# Patient Record
Sex: Female | Born: 1960 | Race: White | Hispanic: No | Marital: Married | Smoking: Never smoker
Health system: Southern US, Community
[De-identification: ages and names within clinical notes are randomized; demographics above are authoritative.]

## PROBLEM LIST (undated history)

## (undated) DIAGNOSIS — M810 Age-related osteoporosis without current pathological fracture: Secondary | ICD-10-CM

## (undated) DIAGNOSIS — Z8679 Personal history of other diseases of the circulatory system: Secondary | ICD-10-CM

## (undated) DIAGNOSIS — E079 Disorder of thyroid, unspecified: Secondary | ICD-10-CM

## (undated) DIAGNOSIS — K219 Gastro-esophageal reflux disease without esophagitis: Secondary | ICD-10-CM

## (undated) DIAGNOSIS — R197 Diarrhea, unspecified: Secondary | ICD-10-CM

## (undated) DIAGNOSIS — F32A Depression, unspecified: Secondary | ICD-10-CM

## (undated) DIAGNOSIS — N950 Postmenopausal bleeding: Secondary | ICD-10-CM

## (undated) DIAGNOSIS — T887XXA Unspecified adverse effect of drug or medicament, initial encounter: Secondary | ICD-10-CM

## (undated) DIAGNOSIS — E7800 Pure hypercholesterolemia, unspecified: Secondary | ICD-10-CM

## (undated) DIAGNOSIS — F329 Major depressive disorder, single episode, unspecified: Secondary | ICD-10-CM

## (undated) DIAGNOSIS — K649 Unspecified hemorrhoids: Secondary | ICD-10-CM

## (undated) DIAGNOSIS — F319 Bipolar disorder, unspecified: Secondary | ICD-10-CM

## (undated) DIAGNOSIS — E039 Hypothyroidism, unspecified: Secondary | ICD-10-CM

## (undated) DIAGNOSIS — B977 Papillomavirus as the cause of diseases classified elsewhere: Secondary | ICD-10-CM

## (undated) DIAGNOSIS — T7840XA Allergy, unspecified, initial encounter: Secondary | ICD-10-CM

## (undated) DIAGNOSIS — G709 Myoneural disorder, unspecified: Secondary | ICD-10-CM

## (undated) HISTORY — DX: Pure hypercholesterolemia, unspecified: E78.00

## (undated) HISTORY — DX: Diarrhea, unspecified: R19.7

## (undated) HISTORY — PX: TONSILLECTOMY: SUR1361

## (undated) HISTORY — DX: Unspecified adverse effect of drug or medicament, initial encounter: T88.7XXA

## (undated) HISTORY — PX: LYMPH NODE BIOPSY: SHX201

## (undated) HISTORY — DX: Depression, unspecified: F32.A

## (undated) HISTORY — PX: EYE SURGERY: SHX253

## (undated) HISTORY — DX: Disorder of thyroid, unspecified: E07.9

## (undated) HISTORY — DX: Papillomavirus as the cause of diseases classified elsewhere: B97.7

## (undated) HISTORY — PX: HYSTEROSCOPY: SHX211

## (undated) HISTORY — DX: Gastro-esophageal reflux disease without esophagitis: K21.9

## (undated) HISTORY — DX: Hypothyroidism, unspecified: E03.9

## (undated) HISTORY — DX: Unspecified hemorrhoids: K64.9

## (undated) HISTORY — DX: Major depressive disorder, single episode, unspecified: F32.9

## (undated) HISTORY — DX: Myoneural disorder, unspecified: G70.9

## (undated) HISTORY — DX: Age-related osteoporosis without current pathological fracture: M81.0

## (undated) HISTORY — DX: Allergy, unspecified, initial encounter: T78.40XA

---

## 1982-07-30 DIAGNOSIS — B977 Papillomavirus as the cause of diseases classified elsewhere: Secondary | ICD-10-CM

## 1982-07-30 HISTORY — DX: Papillomavirus as the cause of diseases classified elsewhere: B97.7

## 1982-07-30 HISTORY — PX: OTHER SURGICAL HISTORY: SHX169

## 1992-07-30 HISTORY — PX: DILATION AND CURETTAGE, DIAGNOSTIC / THERAPEUTIC: SUR384

## 1997-06-02 ENCOUNTER — Encounter: Payer: Self-pay | Admitting: Internal Medicine

## 1997-10-26 ENCOUNTER — Ambulatory Visit (HOSPITAL_COMMUNITY): Admission: RE | Admit: 1997-10-26 | Discharge: 1997-10-26 | Payer: Self-pay | Admitting: Obstetrics and Gynecology

## 1998-10-10 ENCOUNTER — Other Ambulatory Visit: Admission: RE | Admit: 1998-10-10 | Discharge: 1998-10-10 | Payer: Self-pay | Admitting: Obstetrics and Gynecology

## 1999-11-22 ENCOUNTER — Other Ambulatory Visit: Admission: RE | Admit: 1999-11-22 | Discharge: 1999-11-22 | Payer: Self-pay | Admitting: Obstetrics and Gynecology

## 1999-11-29 ENCOUNTER — Ambulatory Visit (HOSPITAL_COMMUNITY): Admission: RE | Admit: 1999-11-29 | Discharge: 1999-11-29 | Payer: Self-pay | Admitting: Obstetrics and Gynecology

## 1999-11-29 ENCOUNTER — Encounter: Payer: Self-pay | Admitting: Obstetrics and Gynecology

## 2000-11-13 ENCOUNTER — Other Ambulatory Visit: Admission: RE | Admit: 2000-11-13 | Discharge: 2000-11-13 | Payer: Self-pay | Admitting: Obstetrics and Gynecology

## 2001-01-07 ENCOUNTER — Ambulatory Visit (HOSPITAL_COMMUNITY): Admission: RE | Admit: 2001-01-07 | Discharge: 2001-01-07 | Payer: Self-pay | Admitting: Obstetrics and Gynecology

## 2001-01-07 ENCOUNTER — Encounter: Payer: Self-pay | Admitting: Obstetrics and Gynecology

## 2001-11-24 ENCOUNTER — Other Ambulatory Visit: Admission: RE | Admit: 2001-11-24 | Discharge: 2001-11-24 | Payer: Self-pay | Admitting: Obstetrics and Gynecology

## 2002-02-10 ENCOUNTER — Ambulatory Visit (HOSPITAL_COMMUNITY): Admission: RE | Admit: 2002-02-10 | Discharge: 2002-02-10 | Payer: Self-pay | Admitting: Obstetrics and Gynecology

## 2002-02-10 ENCOUNTER — Encounter: Payer: Self-pay | Admitting: Obstetrics and Gynecology

## 2002-12-02 ENCOUNTER — Other Ambulatory Visit: Admission: RE | Admit: 2002-12-02 | Discharge: 2002-12-02 | Payer: Self-pay | Admitting: Obstetrics and Gynecology

## 2003-02-18 ENCOUNTER — Ambulatory Visit (HOSPITAL_COMMUNITY): Admission: RE | Admit: 2003-02-18 | Discharge: 2003-02-18 | Payer: Self-pay | Admitting: Obstetrics and Gynecology

## 2003-02-18 ENCOUNTER — Encounter: Payer: Self-pay | Admitting: Obstetrics and Gynecology

## 2003-12-15 ENCOUNTER — Other Ambulatory Visit: Admission: RE | Admit: 2003-12-15 | Discharge: 2003-12-15 | Payer: Self-pay | Admitting: Obstetrics and Gynecology

## 2004-01-11 ENCOUNTER — Encounter: Payer: Self-pay | Admitting: Internal Medicine

## 2004-03-09 ENCOUNTER — Ambulatory Visit (HOSPITAL_COMMUNITY): Admission: RE | Admit: 2004-03-09 | Discharge: 2004-03-09 | Payer: Self-pay | Admitting: Obstetrics and Gynecology

## 2004-12-19 ENCOUNTER — Other Ambulatory Visit: Admission: RE | Admit: 2004-12-19 | Discharge: 2004-12-19 | Payer: Self-pay | Admitting: Obstetrics and Gynecology

## 2004-12-22 ENCOUNTER — Ambulatory Visit: Payer: Self-pay | Admitting: Internal Medicine

## 2005-05-17 ENCOUNTER — Ambulatory Visit: Payer: Self-pay | Admitting: Internal Medicine

## 2005-06-05 ENCOUNTER — Ambulatory Visit (HOSPITAL_COMMUNITY): Admission: RE | Admit: 2005-06-05 | Discharge: 2005-06-05 | Payer: Self-pay | Admitting: Obstetrics and Gynecology

## 2005-08-01 ENCOUNTER — Ambulatory Visit (HOSPITAL_COMMUNITY): Admission: RE | Admit: 2005-08-01 | Discharge: 2005-08-01 | Payer: Self-pay | Admitting: *Deleted

## 2005-10-16 ENCOUNTER — Ambulatory Visit: Payer: Self-pay | Admitting: Internal Medicine

## 2005-10-30 ENCOUNTER — Ambulatory Visit: Payer: Self-pay | Admitting: Internal Medicine

## 2006-03-25 ENCOUNTER — Ambulatory Visit: Payer: Self-pay | Admitting: Internal Medicine

## 2006-04-03 ENCOUNTER — Ambulatory Visit: Payer: Self-pay | Admitting: Internal Medicine

## 2006-05-29 ENCOUNTER — Ambulatory Visit: Payer: Self-pay | Admitting: Internal Medicine

## 2006-05-29 LAB — CONVERTED CEMR LAB
Free T4: 1 ng/dL (ref 0.9–1.8)
TSH: 0.31 microintl units/mL — ABNORMAL LOW (ref 0.35–5.50)

## 2006-05-30 ENCOUNTER — Other Ambulatory Visit: Admission: RE | Admit: 2006-05-30 | Discharge: 2006-05-30 | Payer: Self-pay | Admitting: Obstetrics and Gynecology

## 2006-07-03 ENCOUNTER — Ambulatory Visit (HOSPITAL_COMMUNITY): Admission: RE | Admit: 2006-07-03 | Discharge: 2006-07-03 | Payer: Self-pay | Admitting: Obstetrics and Gynecology

## 2006-08-21 ENCOUNTER — Ambulatory Visit: Payer: Self-pay | Admitting: Internal Medicine

## 2007-02-27 DIAGNOSIS — F329 Major depressive disorder, single episode, unspecified: Secondary | ICD-10-CM

## 2007-02-27 DIAGNOSIS — J45991 Cough variant asthma: Secondary | ICD-10-CM

## 2007-03-03 ENCOUNTER — Ambulatory Visit: Payer: Self-pay | Admitting: Internal Medicine

## 2007-03-03 DIAGNOSIS — R42 Dizziness and giddiness: Secondary | ICD-10-CM

## 2007-03-03 DIAGNOSIS — T148 Other injury of unspecified body region: Secondary | ICD-10-CM

## 2007-03-03 DIAGNOSIS — W57XXXA Bitten or stung by nonvenomous insect and other nonvenomous arthropods, initial encounter: Secondary | ICD-10-CM

## 2007-03-03 DIAGNOSIS — L259 Unspecified contact dermatitis, unspecified cause: Secondary | ICD-10-CM

## 2007-03-03 DIAGNOSIS — E039 Hypothyroidism, unspecified: Secondary | ICD-10-CM | POA: Insufficient documentation

## 2007-04-14 ENCOUNTER — Telehealth: Payer: Self-pay | Admitting: Internal Medicine

## 2007-05-12 ENCOUNTER — Ambulatory Visit: Payer: Self-pay | Admitting: Internal Medicine

## 2007-05-13 LAB — CONVERTED CEMR LAB: TSH: 1.31 microintl units/mL (ref 0.35–5.50)

## 2007-05-14 ENCOUNTER — Ambulatory Visit: Payer: Self-pay | Admitting: Internal Medicine

## 2007-05-14 DIAGNOSIS — J309 Allergic rhinitis, unspecified: Secondary | ICD-10-CM

## 2007-08-26 ENCOUNTER — Telehealth: Payer: Self-pay | Admitting: Internal Medicine

## 2007-09-04 ENCOUNTER — Ambulatory Visit (HOSPITAL_COMMUNITY): Admission: RE | Admit: 2007-09-04 | Discharge: 2007-09-04 | Payer: Self-pay | Admitting: Obstetrics and Gynecology

## 2007-09-22 ENCOUNTER — Telehealth: Payer: Self-pay | Admitting: Internal Medicine

## 2007-10-14 ENCOUNTER — Telehealth: Payer: Self-pay | Admitting: Internal Medicine

## 2007-11-13 ENCOUNTER — Telehealth: Payer: Self-pay | Admitting: *Deleted

## 2008-05-12 ENCOUNTER — Telehealth: Payer: Self-pay | Admitting: Family Medicine

## 2008-05-24 ENCOUNTER — Encounter: Payer: Self-pay | Admitting: Internal Medicine

## 2008-07-05 ENCOUNTER — Telehealth: Payer: Self-pay | Admitting: *Deleted

## 2008-08-02 ENCOUNTER — Telehealth: Payer: Self-pay | Admitting: *Deleted

## 2008-08-11 ENCOUNTER — Ambulatory Visit: Payer: Self-pay | Admitting: Internal Medicine

## 2008-08-11 DIAGNOSIS — M76899 Other specified enthesopathies of unspecified lower limb, excluding foot: Secondary | ICD-10-CM

## 2008-10-14 ENCOUNTER — Ambulatory Visit (HOSPITAL_COMMUNITY): Admission: RE | Admit: 2008-10-14 | Discharge: 2008-10-14 | Payer: Self-pay | Admitting: Obstetrics and Gynecology

## 2008-12-21 ENCOUNTER — Telehealth: Payer: Self-pay | Admitting: *Deleted

## 2008-12-22 ENCOUNTER — Telehealth: Payer: Self-pay | Admitting: Internal Medicine

## 2009-05-29 ENCOUNTER — Emergency Department (HOSPITAL_COMMUNITY): Admission: EM | Admit: 2009-05-29 | Discharge: 2009-05-29 | Payer: Self-pay | Admitting: Emergency Medicine

## 2009-06-15 ENCOUNTER — Ambulatory Visit: Payer: Self-pay | Admitting: Internal Medicine

## 2009-06-15 DIAGNOSIS — R05 Cough: Secondary | ICD-10-CM | POA: Insufficient documentation

## 2009-06-15 DIAGNOSIS — E785 Hyperlipidemia, unspecified: Secondary | ICD-10-CM

## 2009-06-15 DIAGNOSIS — B079 Viral wart, unspecified: Secondary | ICD-10-CM | POA: Insufficient documentation

## 2009-06-15 LAB — CONVERTED CEMR LAB
BUN: 10 mg/dL (ref 6–23)
Basophils Relative: 0.7 % (ref 0.0–3.0)
Calcium: 9.7 mg/dL (ref 8.4–10.5)
Chloride: 108 meq/L (ref 96–112)
Cholesterol: 189 mg/dL (ref 0–200)
Creatinine, Ser: 0.9 mg/dL (ref 0.4–1.2)
Direct LDL: 102.7 mg/dL
Eosinophils Relative: 5.1 % — ABNORMAL HIGH (ref 0.0–5.0)
GFR calc non Af Amer: 70.55 mL/min (ref >60)
HCT: 38.6 % (ref 36.0–46.0)
Hemoglobin: 13.4 g/dL (ref 12.0–15.0)
Lithium Lvl: 0.66 meq/L — ABNORMAL LOW (ref 0.80–1.40)
Lymphs Abs: 1.9 10*3/uL (ref 0.7–4.0)
MCHC: 34.6 g/dL (ref 30.0–36.0)
Potassium: 4.1 meq/L (ref 3.5–5.1)
RBC: 4.13 M/uL (ref 3.87–5.11)
Sodium: 142 meq/L (ref 135–145)
TSH: 1.01 microintl units/mL (ref 0.35–5.50)

## 2009-06-20 ENCOUNTER — Telehealth: Payer: Self-pay | Admitting: *Deleted

## 2009-07-12 ENCOUNTER — Encounter: Payer: Self-pay | Admitting: Internal Medicine

## 2009-08-04 ENCOUNTER — Ambulatory Visit (HOSPITAL_COMMUNITY): Admission: RE | Admit: 2009-08-04 | Discharge: 2009-08-04 | Payer: Self-pay | Admitting: Obstetrics and Gynecology

## 2009-08-04 HISTORY — PX: HYSTEROSCOPY WITH RESECTOSCOPE: SHX5395

## 2009-08-24 ENCOUNTER — Telehealth: Payer: Self-pay | Admitting: Internal Medicine

## 2009-08-29 ENCOUNTER — Inpatient Hospital Stay (HOSPITAL_COMMUNITY): Admission: AD | Admit: 2009-08-29 | Discharge: 2009-08-29 | Payer: Self-pay | Admitting: Obstetrics and Gynecology

## 2009-08-30 ENCOUNTER — Ambulatory Visit: Payer: Self-pay | Admitting: Internal Medicine

## 2009-08-30 DIAGNOSIS — M546 Pain in thoracic spine: Secondary | ICD-10-CM

## 2009-08-30 DIAGNOSIS — R1013 Epigastric pain: Secondary | ICD-10-CM

## 2009-08-30 DIAGNOSIS — R071 Chest pain on breathing: Secondary | ICD-10-CM

## 2009-09-01 ENCOUNTER — Ambulatory Visit: Payer: Self-pay | Admitting: Internal Medicine

## 2009-09-05 ENCOUNTER — Ambulatory Visit: Payer: Self-pay | Admitting: Internal Medicine

## 2009-09-07 ENCOUNTER — Telehealth: Payer: Self-pay | Admitting: Internal Medicine

## 2009-09-09 ENCOUNTER — Telehealth: Payer: Self-pay | Admitting: Internal Medicine

## 2009-09-30 ENCOUNTER — Telehealth: Payer: Self-pay | Admitting: *Deleted

## 2009-12-01 ENCOUNTER — Encounter: Payer: Self-pay | Admitting: Internal Medicine

## 2009-12-21 ENCOUNTER — Encounter: Payer: Self-pay | Admitting: Internal Medicine

## 2010-01-06 ENCOUNTER — Ambulatory Visit (HOSPITAL_COMMUNITY): Admission: RE | Admit: 2010-01-06 | Discharge: 2010-01-06 | Payer: Self-pay | Admitting: Obstetrics and Gynecology

## 2010-04-20 ENCOUNTER — Ambulatory Visit: Payer: Self-pay | Admitting: Family Medicine

## 2010-04-20 DIAGNOSIS — J3489 Other specified disorders of nose and nasal sinuses: Secondary | ICD-10-CM | POA: Insufficient documentation

## 2010-04-21 ENCOUNTER — Encounter: Payer: Self-pay | Admitting: Internal Medicine

## 2010-04-22 ENCOUNTER — Ambulatory Visit: Payer: Self-pay | Admitting: Family Medicine

## 2010-04-24 ENCOUNTER — Telehealth: Payer: Self-pay | Admitting: Family Medicine

## 2010-04-24 ENCOUNTER — Encounter: Payer: Self-pay | Admitting: Family Medicine

## 2010-04-24 ENCOUNTER — Telehealth (INDEPENDENT_AMBULATORY_CARE_PROVIDER_SITE_OTHER): Payer: Self-pay | Admitting: *Deleted

## 2010-04-24 ENCOUNTER — Telehealth: Payer: Self-pay | Admitting: *Deleted

## 2010-06-27 ENCOUNTER — Ambulatory Visit: Payer: Self-pay | Admitting: Internal Medicine

## 2010-07-19 ENCOUNTER — Telehealth: Payer: Self-pay | Admitting: Internal Medicine

## 2010-07-20 ENCOUNTER — Ambulatory Visit: Payer: Self-pay | Admitting: Internal Medicine

## 2010-08-02 ENCOUNTER — Encounter: Payer: Self-pay | Admitting: Internal Medicine

## 2010-08-02 ENCOUNTER — Ambulatory Visit
Admission: RE | Admit: 2010-08-02 | Discharge: 2010-08-02 | Payer: Self-pay | Source: Home / Self Care | Attending: Internal Medicine | Admitting: Internal Medicine

## 2010-08-02 DIAGNOSIS — K219 Gastro-esophageal reflux disease without esophagitis: Secondary | ICD-10-CM | POA: Insufficient documentation

## 2010-08-02 DIAGNOSIS — M722 Plantar fascial fibromatosis: Secondary | ICD-10-CM | POA: Insufficient documentation

## 2010-08-03 ENCOUNTER — Telehealth: Payer: Self-pay | Admitting: *Deleted

## 2010-08-11 ENCOUNTER — Ambulatory Visit
Admission: RE | Admit: 2010-08-11 | Discharge: 2010-08-11 | Payer: Self-pay | Source: Home / Self Care | Attending: Internal Medicine | Admitting: Internal Medicine

## 2010-08-19 ENCOUNTER — Encounter: Payer: Self-pay | Admitting: Obstetrics and Gynecology

## 2010-08-20 ENCOUNTER — Encounter: Payer: Self-pay | Admitting: Obstetrics and Gynecology

## 2010-08-27 LAB — CONVERTED CEMR LAB
AST: 20 units/L (ref 0–37)
BUN: 17 mg/dL (ref 6–23)
Basophils Absolute: 0 10*3/uL (ref 0.0–0.1)
Basophils Relative: 0.2 % (ref 0.0–3.0)
Bilirubin, Direct: 0.1 mg/dL (ref 0.0–0.3)
CO2: 26 meq/L (ref 19–32)
Creatinine, Ser: 0.9 mg/dL (ref 0.4–1.2)
Direct LDL: 158.6 mg/dL
Eosinophils Absolute: 0.4 10*3/uL (ref 0.0–0.7)
Eosinophils Relative: 4.6 % (ref 0.0–5.0)
Glucose, Bld: 90 mg/dL (ref 70–99)
Glucose, Urine, Semiquant: NEGATIVE
HCT: 40.1 % (ref 36.0–46.0)
HDL: 52.6 mg/dL (ref >39.00)
Hemoglobin: 13.7 g/dL (ref 12.0–15.0)
Lithium Lvl: 0.63 meq/L — ABNORMAL LOW (ref 0.80–1.40)
MCV: 92.7 fL (ref 78.0–100.0)
Monocytes Absolute: 0.7 10*3/uL (ref 0.1–1.0)
Monocytes Relative: 7 % (ref 3.0–12.0)
Neutro Abs: 6 10*3/uL (ref 1.4–7.7)
Nitrite: NEGATIVE
Potassium: 4.5 meq/L (ref 3.5–5.1)
RBC: 4.33 M/uL (ref 3.87–5.11)
Sodium: 139 meq/L (ref 135–145)
TSH: 1.84 microintl units/mL (ref 0.35–5.50)
VLDL: 37.8 mg/dL (ref 0.0–40.0)
WBC Urine, dipstick: NEGATIVE
WBC: 9.5 10*3/uL (ref 4.5–10.5)

## 2010-08-29 NOTE — Progress Notes (Signed)
Summary: cold sore  Phone Note Call from Patient Call back at [phone]   Caller: patient triage message Call For: Dusty Raczkowski  Summary of Call: what can she take or put on a cold sore.  [facility] [employer]  Initial call taken by: Roselle Locus,  August 26, 2007 9:12 AM  Follow-up for Phone Call        Called to set appt.  She's in [place] & just got her sister's Zovirax cream for cold sores that she'll use. Follow-up by: Rudy Jew, RN,  August 26, 2007 9:22 AM

## 2010-08-29 NOTE — Letter (Signed)
Summary: [facility]  [facility]   Imported By: Maryln Gottron 08/15/2009 10:41:06  _____________________________________________________________________  External Attachment:    Type:   Image     Comment:   External Document

## 2010-08-29 NOTE — Assessment & Plan Note (Signed)
Summary: 3 day fup/jlp   Vital Signs:  Patient profile:   50 year old female Menstrual status:  irregular Weight:      142 pounds O2 Sat:      98 % on Room air Pulse rate:   83 / minute BP sitting:   120 / 80  (right arm) Cuff size:   regular  Vitals Entered By: Romualdo Bolk, CMA (AAMA) (September 05, 2009 11:36 AM)  O2 Flow:  Room air CC: Follow-up visit on cough. Pt states that she is getting better.   History of Present Illness: Lisa Lee comesin today  for   follow up. .  Her pain is gone  and  said she is much better . 95 %  no fever cough continues but no wheezing . No NVD some ab bleatins prob fropm medications.    Pain is gone.      No sig sob. or cp.  Preventive Screening-Counseling & Management  Alcohol-Tobacco     Alcohol drinks/day: <1     Alcohol type: wine     Smoking Status: never  Caffeine-Diet-Exercise     Caffeine use/day: 2     Does Patient Exercise: yes  Current Medications (verified): 1)  Proventil Hfa 108 (90 Base) Mcg/act Aers (Albuterol Sulfate) .... 2 Puffs As Needed 2)  Synthroid 75 Mcg Tabs (Levothyroxine Sodium) .... Take 1 Tablet By Mouth Once A Day 3)  Lexapro 20 Mg  Tabs (Escitalopram Oxalate) 4)  Lamictal 200 Mg  Tabs (Lamotrigine) 5)  Lithium Carbonate 450 Mg  Tbcr (Lithium Carbonate) .... Take 1 Tablet By Mouth Two Times A Day 6)  Asmanex 60 Metered Doses 220 Mcg/inh  Aepb (Mometasone Furoate) .... 2 Puffs Per Day 7)  Hydromet 5-1.5 Mg/1ml Syrp (Hydrocodone-Homatropine) .Marland Kitchen.. 1-2 Tsp Q 4-6 Hours As Needed Cough 8)  Zofran 4 Mg Tabs (Ondansetron Hcl) .... As Needed  Allergies (verified): 1)  ! Penicillin  Past History:  Past Medical History: Asthma Depression Allergic rhinitis Hypothyroidism Had colonoscopy in the past Consults [facility] Dr. Buena Irish Dr. [facility]  Dr. 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 Divorced Never Smoked Alcohol use-yes Drug use-no Regular exercise-yes hh of 3   pets  2 cats 1 dog .   Review of Systems  The patient denies anorexia, fever, weight loss, chest pain, dyspnea on exertion, and peripheral edema.    Physical Exam  General:  Well-developed,well-nourished,in no acute distress; alert,appropriate and cooperative throughout examination Head:  normocephalic and atraumatic.   Eyes:  vision grossly intact, pupils equal, and pupils round.   Ears:  R ear normal, L ear normal, and no external deformities.   Nose:  no nasal discharge.   Mouth:  pharynx pink and moist.   Neck:  No deformities, masses, or tenderness noted. Lungs:  Normal respiratory effort, chest expands symmetrically. Lungs are clear to auscultation, no crackles or wheezes.no dullness.   Heart:  Normal rate and regular rhythm. S1 and S2 normal without gallop, murmur, click, rub or other extra sounds. Abdomen:  Bowel sounds positive,abdomen soft and non-tender without masses, organomegaly or  noted. Extremities:  no clubbing cyanosis or edema  Skin:  turgor normal and color normal.   Cervical Nodes:  No lymphadenopathy noted Psych:  Oriented X3, normally interactive, good eye contact, not anxious appearing, and not depressed appearing.     Impression &  Recommendations:  Problem # 1:  PAINFUL RESPIRATION (ICD-786.52) Assessment Improved resolved   presumed from infection  Problem # 2:  COUGH (ICD-786.2) slightly improved .    no asthmatic symptom otherwise   this could be post infectious  cough  Complete Medication List: 1)  Proventil Hfa 108 (90 Base) Mcg/act Aers (Albuterol sulfate) .... 2 puffs as needed 2)  Synthroid 75 Mcg Tabs (Levothyroxine sodium) .... Take 1 tablet by mouth once a day 3)  Lexapro 20 Mg Tabs (Escitalopram oxalate) 4)  Lamictal 200 Mg Tabs (Lamotrigine) 5)  Lithium Carbonate 450 Mg Tbcr  (Lithium carbonate) .... Take 1 tablet by mouth two times a day 6)  Asmanex 60 Metered Doses 220 Mcg/inh Aepb (Mometasone furoate) .... 2 puffs per day 7)  Hydromet 5-1.5 Mg/11ml Syrp (Hydrocodone-homatropine) .Marland Kitchen.. 1-2 tsp q 4-6 hours as needed cough 8)  Zofran 4 Mg Tabs (Ondansetron hcl) .... As needed  Patient Instructions: 1)  ok to work tomorrow . 2)  You may cough another 5-7 days then should be better. 3)  Call if needed.    stay on asthma meds  4)  Ok to call if wants new colonscopy freferral

## 2010-08-29 NOTE — Progress Notes (Signed)
Summary: Culture confirmed MRSA  Phone Note Call from Patient Call back at [phone] (cell)   Caller: Patient Call For: Burchette Reason for Call: Referral Summary of Call: VM from pt reporting she did see Dr Ezzard Standing and wound is draining adequately on it's own, culture did confirm MRSA, cont on Keflex or does he want to make a change? Initial call taken by: Sid Falcon LPN,  April 24, 2010 5:26 PM  Follow-up for Phone Call        I'm confused.  Cx report I saw did not show MRSA but normal flora.  If no MRSA, keflex should be adequate. Follow-up by: Evelena Peat MD,  April 24, 2010 7:12 PM  Additional Follow-up for Phone Call Additional follow up Details #1::        I spoke with pt this AM, Dr Ezzard Standing called for a 2nd culture report yesterday afternoon around 3:30pm.  The report was faxed to him, mod MRSA.  Pt is taking both the Keflex and Bactrim. Additional Follow-up by: Sid Falcon LPN,  April 25, 2010 8:42 AM    Additional Follow-up for Phone Call Additional follow up Details #2::    At this point she should remain on the Septra (we do need a sensitivity report when available). Follow-up by: Evelena Peat MD,  April 25, 2010 12:34 PM  Additional Follow-up for Phone Call Additional follow up Details #3:: Details for Additional Follow-up Action Taken: Pt aware of results. She states that she had a culture done at the [facility]. No results are in the system. But pt does have a copy of the results that states she has MRSA. PT is going to continue the Septra. Additional Follow-up by: Romualdo Bolk, CMA (AAMA),  April 25, 2010 1:02 PM

## 2010-08-29 NOTE — Progress Notes (Signed)
Summary: refill on tussinex  Phone Note Outgoing Call Call back at [phone]   Call placed by: Romualdo Bolk, CMA Duncan Dull),  August 24, 2009 12:08 PM Call placed to: Patient Summary of Call: recieved a refill request from [facility] for Tussinex. I spoke to pt and she said that she does need a refill on this. Pt states that she is having some coughing and congestion but doesn't feeling like she needs to come in. This has been going 3 days. No fever, some wheezing. Pt wants to wait and see how she feels tomorrow before make appt.  Pt has tried Mucinex DM but it didn't help. The wheezing and cough is worse at night and she has a had to use her inhaler for this.  Initial call taken by: Romualdo Bolk, CMA Duncan Dull),  August 24, 2009 12:11 PM  Follow-up for Phone Call        Per Dr. Fabian Sharp- can do hydrocodone cough syrup 6 oz 1-2 tsp every 4-6 hours as needed for cough and can add an antihistamine otc.  Pt aware and wants [place] to [facility]. Pharmacy notified that tussinex has been denied. Rx called in. Follow-up by: Romualdo Bolk, CMA (AAMA),  August 24, 2009 4:55 PM    New/Updated Medications: HYDROMET 5-1.5 MG/5ML SYRP (HYDROCODONE-HOMATROPINE) 1-2 tsp q 4-6 hours as needed cough Prescriptions: HYDROMET 5-1.5 MG/5ML SYRP (HYDROCODONE-HOMATROPINE) 1-2 tsp q 4-6 hours as needed cough  #6oz x 0   Entered by:   Romualdo Bolk, CMA (AAMA)   Authorized by:   Madelin Headings MD   Signed by:   Romualdo Bolk, CMA (AAMA) on 08/24/2009   Method used:   Telephoned to ...       [facility]  [phone]* (retail)       [address] [place], [place]  [address]       Ph: [phone] or [phone]       Fax: [phone]   RxID:   [id number]

## 2010-08-29 NOTE — Progress Notes (Signed)
Summary: chest still tight & coughing  Phone Note Call from Patient Call back at [phone]   Summary of Call: Still coughing a lot & chest feels tight.  Started steroids yesterday.  Should I come in again or be patient?  Concerned with the weekend coming.  Coming up clear or white mucus, fair amount.  Using inhaler be cause she is wheezy.   No fever or chills.  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 Initial call taken by: Rudy Jew, RN,  September 09, 2009 9:11 AM  Follow-up for Phone Call        Per Dr. Fabian Sharp- should be okay- if feels tight tomorrow call and be seen at [facility]. Follow-up by: Romualdo Bolk, CMA (AAMA),  September 09, 2009 10:02 AM  Additional Follow-up for Phone Call Additional follow up Details #1::        Patient says okay.   Additional Follow-up by: Rudy Jew, RN,  September 09, 2009 10:17 AM

## 2010-08-29 NOTE — Progress Notes (Signed)
Summary: REFILL  Phone Note Refill Request Message from:  Fax from Pharmacy  Refills Requested: Medication #1:  SYNTHROID 75 MCG TABS Take 1 tablet by mouth once a day [BMN] [facility]---[phone]     FAX---[phone]  Initial call taken by: Warnell Forester,  September 30, 2009 3:43 PM  Follow-up for Phone Call        Rx sent to the pharmacy Follow-up by: Romualdo Bolk, CMA Duncan Dull),  September 30, 2009 3:56 PM    Prescriptions: SYNTHROID 75 MCG TABS (LEVOTHYROXINE SODIUM) Take 1 tablet by mouth once a day Brand medically necessary #90 x 3   Entered by:   Romualdo Bolk, CMA (AAMA)   Authorized by:   Madelin Headings MD   Signed by:   Romualdo Bolk, CMA (AAMA) on 09/30/2009   Method used:   Electronically to        [facility]* (retail)       [address]       [address] [employer]       [place], [place]  [address]       Ph: [phone]       Fax: [phone]   RxID:   [id number]

## 2010-08-29 NOTE — Assessment & Plan Note (Signed)
Summary: 2 DAY ROV/NJR   Vital Signs:  Patient profile:   50 year old female Menstrual status:  irregular Weight:      140 pounds O2 Sat:      99 % on Room air Temp:     98.2 degrees F oral Pulse rate:   65 / minute BP sitting:   100 / 70  (right arm) Cuff size:   regular  Vitals Entered By: Romualdo Bolk, CMA (AAMA) (September 01, 2009 9:43 AM)  O2 Flow:  Room air CC: Follow-up visit- Pt states that she is feeling some better but is still coughing alot.   History of Present Illness: Lisa Lee comesin today with her husband  for    follow up of painful resp   and cough .   she    over the last few days and is feeling much better but fatigued. Still coughing   . Hydrocodone helping . as well as the zofran.   no fever or chills.  NO Vor D .   dec appetite.   Asthma seems stable.   Preventive Screening-Counseling & Management  Alcohol-Tobacco     Alcohol drinks/day: <1     Alcohol type: wine     Smoking Status: never  Caffeine-Diet-Exercise     Caffeine use/day: 2     Does Patient Exercise: yes  Current Medications (verified): 1)  Proventil Hfa 108 (90 Base) Mcg/act Aers (Albuterol Sulfate) .... 2 Puffs As Needed 2)  Synthroid 75 Mcg Tabs (Levothyroxine Sodium) .... Take 1 Tablet By Mouth Once A Day 3)  Lexapro 20 Mg  Tabs (Escitalopram Oxalate) 4)  Lamictal 200 Mg  Tabs (Lamotrigine) 5)  Lithium Carbonate 450 Mg  Tbcr (Lithium Carbonate) .... Take 1 Tablet By Mouth Two Times A Day 6)  Asmanex 60 Metered Doses 220 Mcg/inh  Aepb (Mometasone Furoate) .... 2 Puffs Per Day 7)  Hydromet 5-1.5 Mg/27ml Syrp (Hydrocodone-Homatropine) .Marland Kitchen.. 1-2 Tsp Q 4-6 Hours As Needed Cough  Allergies (verified): 1)  ! Penicillin  Past History:  Past medical, surgical, family and social histories (including risk factors) reviewed for relevance to current acute and chronic problems.  Past Medical History: Reviewed history from 08/11/2008 and no changes  required. Asthma Depression Allergic rhinitis Hypothyroidism  Consults [facility] Dr. Buena Irish Dr. [facility]  Dr. Genella Mech  Past Surgical History: Reviewed history from 08/30/2009 and no changes required. hysteroscopy   uterin polypectomy  Jan 7th   Past History:  Care Management: Gynecology: [facility] Gastroenterology: [facility]  Family History: Reviewed history from 02/27/2007 and no changes required. Family History of Arthritis Family History High cholesterol Family History Hypertension Family History Other cancer-Colon Family History of Cardiovascular disorder  Social History: Reviewed history from 08/11/2008 and no changes required. Retired Divorced Never Smoked Alcohol use-yes Drug use-no Regular exercise-yes hh of 3   pets  2 cats 1 dog .   Review of Systems       The patient complains of anorexia and chest pain.  The patient denies fever, syncope, peripheral edema, hemoptysis, abdominal pain, melena, hematochezia, severe indigestion/heartburn, transient blindness, difficulty walking, unusual weight change, abnormal bleeding, and enlarged lymph nodes.    Physical Exam  General:  tired but non toxic  in nad  more comfortable today.  no resp distress Head:  normocephalic and atraumatic.   Eyes:  clear  no discharge  Ears:  R ear normal and L ear normal.   Nose:  mild congestion Mouth:  pharynx pink and moist.   Neck:  No deformities, masses, or tenderness noted. Lungs:  Normal respiratory effort, chest expands symmetrically. Lungs are clear to auscultation, no crackles or wheezes.no dullness.   Heart:  Normal rate and regular rhythm. S1 and S2 normal without gallop, murmur, click, rub or other extra sounds.no lifts.   Abdomen:  Bowel sounds positive,abdomen soft and non-tender without masses, organomegaly or   noted. Pulses:  nl cap refill  Extremities:  no clubbing cyanosis or edema  Neurologic:   non focal  Skin:  turgor normal, color normal, no petechiae, and no purpura.   Cervical Nodes:  No lymphadenopathy noted Psych:  Oriented X3, good eye contact, and not anxious appearing.     Impression & Recommendations:  Problem # 1:  PAINFUL RESPIRATION (ICD-786.52) Assessment Improved initially said 90 % better but after time in office and coughing  felt  increasing symptom but still improved .  prob from RTI consider early pneumonia based on WBC and presentation even though c xray ok.     rest improving these symptom  on day 4 of antibiotic and taking hydrocodone.   no resp compromise.    Problem # 2:  ABDOMINAL PAIN, EPIGASTRIC (ICD-789.06) Assessment: Improved zofran for nausea   still taking  ibu  but ok.     Problem # 3:  COUGH (ICD-786.2) WIth hx of asthma and RTI        Problem # 4:  ASTHMA (ICD-493.90) Assessment: Comment Only  Her updated medication list for this problem includes:    Proventil Hfa 108 (90 Base) Mcg/act Aers (Albuterol sulfate) .Marland Kitchen... 2 puffs as needed    Asmanex 60 Metered Doses 220 Mcg/inh Aepb (Mometasone furoate) .Marland Kitchen... 2 puffs per day  Complete Medication List: 1)  Proventil Hfa 108 (90 Base) Mcg/act Aers (Albuterol sulfate) .... 2 puffs as needed 2)  Synthroid 75 Mcg Tabs (Levothyroxine sodium) .... Take 1 tablet by mouth once a day 3)  Lexapro 20 Mg Tabs (Escitalopram oxalate) 4)  Lamictal 200 Mg Tabs (Lamotrigine) 5)  Lithium Carbonate 450 Mg Tbcr (Lithium carbonate) .... Take 1 tablet by mouth two times a day 6)  Asmanex 60 Metered Doses 220 Mcg/inh Aepb (Mometasone furoate) .... 2 puffs per day 7)  Hydromet 5-1.5 Mg/44ml Syrp (Hydrocodone-homatropine) .Marland Kitchen.. 1-2 tsp q 4-6 hours as needed cough  Patient Instructions: 1)  no work  this weekend .    2)  OV on monday and   3)  call over weekend if  progressive increasing  sob.  greater than 50% of visit spent in counseling  25 minutes

## 2010-08-29 NOTE — Progress Notes (Signed)
Summary: ENT today  Phone Note Call from Patient Call back at Home Phone [phone]   Caller: Patient Summary of Call: Pt called and said that she was returning a call from Dr. Caryl Never. Pls call back.  Initial call taken by: Lucy Antigua,  April 24, 2010 9:10 AM  Follow-up for Phone Call        spoke with pt .  some better but stiil has some L nasal swelling. Set up to see ENT, today if possible.  See if we can get in to see Dr Narda Bonds. Follow-up by: Evelena Peat MD,  April 24, 2010 9:16 AM  Additional Follow-up for Phone Call Additional follow up Details #1::        Appt Scheduled Today 9/26 @ 2.45pm with Dr. Ezzard Standing.  Pt aware. Additional Follow-up by: Corky Mull,  April 24, 2010 9:51 AM

## 2010-08-29 NOTE — Progress Notes (Signed)
Summary: cough/wheezing  Phone Note Call from Patient   Caller: Patient Call For: Madelin Headings MD Summary of Call: Pt calls to let Dr. Fabian Sharp know that her cough continues with the Mucinex, and is wheezing.  ? about starting Steroids? No fever or other symptoms. [phone] Initial call taken by: Lynann Beaver CMA,  September 07, 2009 11:23 AM  Follow-up for Phone Call        Per Dr. Fabian Sharp- Call in Prednisone 20mg  3 by mouth once daily for 2 days then 2 by mouth once daily for 3 day or as directed #20. Rx sent electronically. I tried to call pt at work but her line was busy. I called pt's cell and left her a message that we called in rx for her. Follow-up by: Romualdo Bolk, CMA (AAMA),  September 07, 2009 1:37 PM    New/Updated Medications: PREDNISONE 20 MG TABS (PREDNISONE) 3 by mouth once daily for 2 days then 2 by mouth once daily for 3 days or as directed Prescriptions: PREDNISONE 20 MG TABS (PREDNISONE) 3 by mouth once daily for 2 days then 2 by mouth once daily for 3 days or as directed  #20 x 0   Entered by:   Romualdo Bolk, CMA (AAMA)   Authorized by:   Madelin Headings MD   Signed by:   Romualdo Bolk, CMA (AAMA) on 09/07/2009   Method used:   [facility]        [facility]  [phone]* (retail)       [address] [place], [place]  [address]       Ph: [phone] or [phone]       Fax: [phone]   RxID:   [id number]

## 2010-08-29 NOTE — Progress Notes (Signed)
Summary: pain med blyth saw for p  Phone Note Call from Patient Call back at [phone]   Caller: vm 9-23 4:44 Call For: blyth for panosh Summary of Call: Requesting pain med for thing on face.  Tylenol - chewing up stomach & not holding.  [facility]. Initial call taken by: Rudy Jew, RN,  April 24, 2010 8:12 AM  Follow-up for Phone Call        can try Tramadol 50mg  by mouth three times a day as needed pain, # 40, 0 rf Follow-up by: Danise Edge MD,  April 24, 2010 9:25 AM

## 2010-08-29 NOTE — Procedures (Signed)
Summary: Colonoscopy Report/[facility]  Colonoscopy Report/[facility]   Imported By: Maryln Gottron 12/30/2009 11:23:14  _____________________________________________________________________  External Attachment:    Type:   Image     Comment:   External Document

## 2010-08-29 NOTE — Consult Note (Signed)
Summary: ENT-Dr. Narda Bonds  ENT-Dr. Narda Bonds   Imported By: Maryln Gottron 05/02/2010 10:19:21  _____________________________________________________________________  External Attachment:    Type:   Image     Comment:   External Document

## 2010-08-29 NOTE — Assessment & Plan Note (Signed)
Summary: MERSA/DLO   Vital Signs:  Patient profile:   50 year old female Menstrual status:  irregular Height:      61 inches (154.94 cm) Weight:      143.50 pounds (65.23 kg) BMI:     27.21 O2 Sat:      96 % on Room air Temp:     97.8 degrees F (36.56 degrees C) oral Pulse rate:   80 / minute BP sitting:   116 / 70  (left arm) Cuff size:   regular  Vitals Entered By: Lucious Groves CMA (April 22, 2010 9:37 AM)  O2 Flow:  Room air CC: Confirmed MRSA and nose pain./kb Is Patient Diabetic? No Pain Assessment Patient in pain? yes     Location: nose Intensity: 4-5 Type: aching Onset of pain  since Wed. Comments Patient notes that the ABX given (Bactroban and Bactrim) have not helped./kb   History of Present Illness: Worsening redness and pain nose. Seen earlier this week and intranasal cx sent. No hx MRSA.  Pt placed on Septra with no improvement. Pt also using bactroban intranasal without improvement. No fever.   Works in Facilities manager.  No other rashes.  Current Medications (verified): 1)  Proventil Hfa 108 (90 Base) Mcg/act Aers (Albuterol Sulfate) .... 2 Puffs As Needed 2)  Synthroid 75 Mcg Tabs (Levothyroxine Sodium) .... Take 1 Tablet By Mouth Once A Day 3)  Lexapro 20 Mg  Tabs (Escitalopram Oxalate) 4)  Lamictal 200 Mg  Tabs (Lamotrigine) 5)  Lithium Carbonate 450 Mg  Tbcr (Lithium Carbonate) .... Take 1 Tablet By Mouth Two Times A Day 6)  Asmanex 60 Metered Doses 220 Mcg/inh  Aepb (Mometasone Furoate) .... 2 Puffs Per Day 7)  Bactrim Ds 800-160 Mg Tabs (Sulfamethoxazole-Trimethoprim) .Marland Kitchen.. 1 Tab By Mouth Two Times A Day X 10 Days 8)  Bactroban 2 % Oint (Mupirocin) .... Apply To B/l Nares Two Times A Day X 7 Days W/new Qtip Each Application  Allergies (verified): 1)  ! Penicillin  Past History:  Past Medical History: Last updated: 09/05/2009 Asthma Depression Allergic rhinitis Hypothyroidism Had colonoscopy in the past [facility] Counseling Dr.  Buena Irish Dr. [facility]  Dr. Genella Mech PMH reviewed for relevance  Review of Systems      See HPI  Physical Exam  General:  Well-developed,well-nourished,in no acute distress; alert,appropriate and cooperative throughout examination Ears:  External ear exam shows no significant lesions or deformities.  Otoscopic examination reveals clear canals, tympanic membranes are intact bilaterally without bulging, retraction, inflammation or discharge. Hearing is grossly normal bilaterally. Nose:  pt has erythema distal nose and inside L naris some nonfluctuant swelling.  Tender to palpation.  After obtaining consent from pt, used #25 gauge needle and tried to unroof scabbed area and minimal purulence expressed. Mouth:  Oral mucosa and oropharynx without lesions or exudates.  Teeth in good repair. Neck:  No deformities, masses, or tenderness noted.   Impression & Recommendations:  Problem # 1:  OTHER DISEASES OF NASAL CAVITY AND SINUSES (ICD-478.19) cellulitis nose with ?of early abscess.  Very little purulence expressed as noted. Recultured .  Will add Keflex for possible strep coverage pending culture results. Pt encouraged to use warm compresses. Orders: T-Culture, Wound ([id number])  Complete Medication List: 1)  Proventil Hfa 108 (90 Base) Mcg/act Aers (Albuterol sulfate) .... 2 puffs as needed 2)  Synthroid 75 Mcg Tabs (Levothyroxine sodium) .... Take 1 tablet by mouth once a day 3)  Lexapro 20 Mg Tabs (Escitalopram oxalate) 4)  Lamictal  200 Mg Tabs (Lamotrigine) 5)  Lithium Carbonate 450 Mg Tbcr (Lithium carbonate) .... Take 1 tablet by mouth two times a day 6)  Asmanex 60 Metered Doses 220 Mcg/inh Aepb (Mometasone furoate) .... 2 puffs per day 7)  Bactrim Ds 800-160 Mg Tabs (Sulfamethoxazole-trimethoprim) .Marland Kitchen.. 1 tab by mouth two times a day x 10 days 8)  Bactroban 2 % Oint (Mupirocin) .... Apply to b/l nares two times a day x 7 days w/new qtip each application 9)   Cephalexin 500 Mg Caps (Cephalexin) .... One by mouth three times a day for 10 days 10)  Oxycodone-acetaminophen 5-325 Mg Tabs (Oxycodone-acetaminophen) .Marland Kitchen.. 1-2 by mouth every 4-6 hours as needed pain  Patient Instructions: 1)  Warm compresses several times daily 2)  Call or follow up promptly for any fever or worsening erythema. Prescriptions: OXYCODONE-ACETAMINOPHEN 5-325 MG TABS (OXYCODONE-ACETAMINOPHEN) 1-2 by mouth every 4-6 hours as needed pain  #15 x 0   Entered and Authorized by:   Evelena Peat MD   Signed by:   Evelena Peat MD on 04/22/2010   Method used:   Print then Give to Patient   RxID:   [id number] CEPHALEXIN 500 MG CAPS (CEPHALEXIN) one by mouth three times a day for 10 days  #30 x 0   Entered and Authorized by:   Evelena Peat MD   Signed by:   Evelena Peat MD on 04/22/2010   Method used:   Electronically to        [facility]  [phone]* (retail)       [address] [place], [place]  [address]       Ph: [phone] or [phone]       Fax: [phone]   RxID:   [id number]

## 2010-08-29 NOTE — Assessment & Plan Note (Signed)
Summary: PLEURISY / URI? / CONGESTION // RS   Vital Signs:  Patient profile:   50 year old female Menstrual status:  irregular Weight:      140 pounds O2 Sat:      99 % on Room air Temp:     97.6 degrees F oral Pulse rate:   72 / minute BP sitting:   110 / 60  (right arm) Cuff size:   regular  Vitals Entered By: Romualdo Bolk, CMA (AAMA) (August 30, 2009 8:25 AM)  O2 Flow:  Room air CC: Coughing. Then on 1/31 pt took motrin and sudafed. Got sick to her stomach and started hurting in her chest area and vomiting. Pt was taken to [facility] admissions. She was given stadol and phenergan. Dx with plurisy and was told to follow up with [place].   History of Present Illness: Lisa Lee comesin today for   for follow up of ED visit  at [facility]  this past weekend  went to work and got sick and was seen in ed by Dr Stefano Gaul.   Jan 6   for uterine polyp  had bleeding .  NO  complication  but had a cold at that time.    then  better until  .  jan 22 or so and then developed cough and st.  ? if fever at onset.   See last  phone note.   cough med helped some and cough actually getting better  . However Yesterday had  epigastric   abd pain fatigue and dizziness and nausea and then got  back thorax pain  sore all over left more than right . hurts to breath and move.  feels some Increase respirations and nose congestion   . cough   Had ed eval with labs c xtray and EKG. nl vs and pulse ox  WBC were 10.7   Was told to take nsaid and hydodan meds for pain and given a z pack .      Cough  slightly better the day.  Had diarrhea last pm  . Used zofran. and motrin and hycodan   around the clock.  Not taking much liwuwids today.    feels tired .  No current wheezing on her asthma controller med.    Has had lithium level recently and was normal.  no hx of hormone use of dvt . No leg swelling.  Preventive Screening-Counseling & Management  Alcohol-Tobacco     Alcohol  drinks/day: <1     Alcohol type: wine     Smoking Status: never  Caffeine-Diet-Exercise     Caffeine use/day: 2     Does Patient Exercise: yes  Current Medications (verified): 1)  Proventil Hfa 108 (90 Base) Mcg/act Aers (Albuterol Sulfate) .... 2 Puffs As Needed 2)  Synthroid 75 Mcg Tabs (Levothyroxine Sodium) .... Take 1 Tablet By Mouth Once A Day 3)  Lexapro 20 Mg  Tabs (Escitalopram Oxalate) 4)  Lamictal 200 Mg  Tabs (Lamotrigine) 5)  Lithium Carbonate 450 Mg  Tbcr (Lithium Carbonate) .... Take 1 Tablet By Mouth Two Times A Day 6)  Asmanex 60 Metered Doses 220 Mcg/inh  Aepb (Mometasone Furoate) .... 2 Puffs Per Day 7)  Hydromet 5-1.5 Mg/62ml Syrp (Hydrocodone-Homatropine) .Marland Kitchen.. 1-2 Tsp Q 4-6 Hours As Needed Cough  Allergies (verified): 1)  ! Penicillin  Past History:  Past medical, surgical, family and social histories (including risk factors) reviewed, and no changes noted (except as  noted below).  Past Medical History: Reviewed history from 08/11/2008 and no changes required. Asthma Depression Allergic rhinitis Hypothyroidism  Consults [facility] Dr. Buena Irish Dr. [facility]  Dr. Genella Mech  Past Surgical History: hysteroscopy   uterin polypectomy  Jan 7th   Family History: Reviewed history from 02/27/2007 and no changes required. Family History of Arthritis Family History High cholesterol Family History Hypertension Family History Other cancer-Colon Family History of Cardiovascular disorder  Social History: Reviewed history from 08/11/2008 and no changes required. Retired Divorced Never Smoked Alcohol use-yes Drug use-no Regular exercise-yes hh of 3   pets  2 cats 1 dog .   Review of Systems       The patient complains of anorexia, prolonged cough, and abdominal pain.  The patient denies fever, weight gain, decreased hearing, hoarseness, syncope, peripheral edema, headaches, hemoptysis, melena, hematochezia, severe  indigestion/heartburn, hematuria, transient blindness, difficulty walking, unusual weight change, abnormal bleeding, enlarged lymph nodes, and angioedema.    Physical Exam  General:  tired non toxic appearing in nad   washed out.walks gingerly because of back pain  Head:  Normocephalic and atraumatic without obvious abnormalities. No apparent alopecia or balding. Eyes:  clear  no discharge  Ears:  R ear normal, L ear normal, and no external deformities.   Nose:  very coingested no face pain Mouth:  pharynx pink and moist.   Neck:  No deformities, masses, or tenderness noted. Chest Wall:  no deformities and no mass.    tender left trapezius and also bilateral   rib cage area with compression  Lungs:  normal respiratory effort, no intercostal retractions, no accessory muscle use, normal breath sounds, no dullness, no fremitus, no crackles, and no wheezes.  some mouth breathing and increase resp rate  Heart:  Normal rate and regular rhythm. S1 and S2 normal without gallop, murmur, click, rub or other extra sounds. rate 70  Abdomen:  Bowel sounds positive,abdomen soft and non-tender without masses, organomegaly or  noted.  mild epigastric tenderness Msk:  no joint swelling, no joint warmth, and no redness over joints.   Pulses:  nl cap refill  Extremities:  no clubbing cyanosis or edema  Neurologic:  non focal   no tremor Skin:  turgor normal, color normal, no ecchymoses, no petechiae, and no purpura.   Cervical Nodes:  No lymphadenopathy noted Psych:  Oriented X3, good eye contact, not anxious appearing, and not depressed appearing.   record obtained and reviewed  Impression & Recommendations:  Problem # 1:  PAINFUL RESPIRATION (ICD-786.52) in the setting or resp infection  symptom   under rx for atypical    disc diff dx .  can try rescu in [place] if needed.    disc pulmonary emboli in diff dx but   I think this is very unlikely with  her medical hx and context.  i think this infectious  related and  other issues can be med effects.   Monitor asthma  signs also.       Problem # 2:  ABDOMINAL PAIN, EPIGASTRIC (ICD-789.06) see above   Problem # 3:  ASTHMA (ICD-493.90)  Her updated medication list for this problem includes:    Proventil Hfa 108 (90 Base) Mcg/act Aers (Albuterol sulfate) .Marland Kitchen... 2 puffs as needed    Asmanex 60 Metered Doses 220 Mcg/inh Aepb (Mometasone furoate) .Marland Kitchen... 2 puffs per day  Problem # 4:  COUGH (ICD-786.2) resp infection  poss rad alse  getting better.   Complete Medication List: 1)  Proventil  Hfa 108 (90 Base) Mcg/act Aers (Albuterol sulfate) .... 2 puffs as needed 2)  Synthroid 75 Mcg Tabs (Levothyroxine sodium) .... Take 1 tablet by mouth once a day 3)  Lexapro 20 Mg Tabs (Escitalopram oxalate) 4)  Lamictal 200 Mg Tabs (Lamotrigine) 5)  Lithium Carbonate 450 Mg Tbcr (Lithium carbonate) .... Take 1 tablet by mouth two times a day 6)  Asmanex 60 Metered Doses 220 Mcg/inh Aepb (Mometasone furoate) .... 2 puffs per day 7)  Hydromet 5-1.5 Mg/43ml Syrp (Hydrocodone-homatropine) .Marland Kitchen.. 1-2 tsp q 4-6 hours as needed cough  Patient Instructions: 1)  pain med as needed to minimize gi se . 2)  use afrin nose spray for congestion up to 3 days instead of sudafed. 3)  Zofran ok . 4)  rest and fluids. 5)  ROV  Thursday.  if any worsening ...call  .

## 2010-08-29 NOTE — Letter (Signed)
Summary: [facility]  [facility]   Imported By: Maryln Gottron 12/29/2009 10:42:10  _____________________________________________________________________  External Attachment:    Type:   Image     Comment:   External Document

## 2010-08-29 NOTE — Assessment & Plan Note (Signed)
Summary: large red sore on nose/?mrsa/cjr   Vital Signs:  Patient profile:   50 year old female Menstrual status:  irregular Height:      61 inches (154.94 cm) Weight:      143 pounds (65 kg) BMI:     27.12 O2 Sat:      98 % on Room air Temp:     98.3 degrees F (36.83 degrees C) oral Pulse rate:   84 / minute BP sitting:   118 / 78  (left arm) Cuff size:   regular  Vitals Entered By: Josph Macho RMA (April 20, 2010 9:54 AM)  O2 Flow:  Room air CC: Red sore on nose? MRSA? -sore X4 days/ CF Is Patient Diabetic? No   History of Present Illness: patient is a 50 year old Caucasian female who is in today with a 4 day history of painful lesions in her left nostril and on the outside of her nose on the left. 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 She denies fevers, chills, malaise, myalgias although she does acknowledge some fatigue and mild lightheadedness this am after working last night. She denies up previous history of similar lesions. She's had no recent illness, chest pain, palpitations, shortness of breath, GU complaints. She does acknowledge a long history of intermittent diarrhea but this has not worsened recently.  Current Medications (verified): 1)  Proventil Hfa 108 (90 Base) Mcg/act Aers (Albuterol Sulfate) .... 2 Puffs As Needed 2)  Synthroid 75 Mcg Tabs (Levothyroxine Sodium) .... Take 1 Tablet By Mouth Once A Day 3)  Lexapro 20 Mg  Tabs (Escitalopram Oxalate) 4)  Lamictal 200 Mg  Tabs (Lamotrigine) 5)  Lithium Carbonate 450 Mg  Tbcr (Lithium Carbonate) .... Take 1 Tablet By Mouth Two Times A Day 6)  Asmanex 60 Metered Doses 220 Mcg/inh  Aepb (Mometasone Furoate) .... 2 Puffs Per Day  Allergies (verified): 1)  ! Penicillin  Past History:  Past medical history reviewed  for relevance to current acute and chronic problems. Social history (including risk factors) reviewed for relevance to current acute and chronic problems.  Past Medical History: Reviewed history from 09/05/2009 and no changes required. Asthma Depression Allergic rhinitis Hypothyroidism Had colonoscopy in the past [facility] Counseling Dr. Buena Irish Dr. [facility]  Dr. Genella Mech  Social History: Reviewed history from 09/05/2009 and no changes required. 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 Divorced Never Smoked Alcohol use-yes Drug use-no Regular exercise-yes hh of 3   pets  2 cats 1 dog .   Review of Systems      See HPI  Physical Exam  General:  Well-developed,well-nourished,in no acute distress; alert,appropriate and cooperative throughout examination Head:  Normocephalic and atraumatic without obvious abnormalities. No apparent alopecia or balding. Eyes:  No corneal or conjunctival inflammation noted. EOMI.  Ears:  External ear exam shows no significant lesions or deformities.  Otoscopic examination reveals clear canals, tympanic membranes are intact bilaterally without bulging, retraction, inflammation or discharge. Hearing is grossly normal bilaterally. Nose:  external deformity and external erythema on left. Swelling noted over lateral wall of internal left nares as well. No internal pustule. On the external left nares there is a very small white head at the center of a larger, erythematous, swollen, firm lesion at the tip of her nose.  right nostril and the external right side of the nose are without lesions. Mouth:  Oral mucosa and oropharynx without lesions or exudates.  Teeth in good repair. Neck:  No deformities, masses, or tenderness noted. Lungs:  Normal respiratory effort, chest expands symmetrically. Lungs are clear to auscultation, no crackles or wheezes. Heart:  Normal rate and regular rhythm. S1 and S2 normal without gallop,  murmur, click, rub or other extra sounds. Abdomen:  Bowel sounds positive,abdomen soft and non-tender without masses, organomegaly or hernias noted. Extremities:  No clubbing, cyanosis, edema, or deformity noted  Skin:  Intact without suspicious lesions or rashes Cervical Nodes:  No lymphadenopathy noted Psych:  Cognition and judgment appear intact. Alert and cooperative with normal attention span and concentration. No apparent delusions, illusions, hallucinations   Impression & Recommendations:  Problem # 1:  OTHER DISEASES OF NASAL CAVITY AND SINUSES (ICD-478.19)  Orders: T-Culture, Wound ([id number]) Left side of nose is cultured inside and out.  She is started on Bactroban ointment to b/l nares two times a day, Bactrim DS and instructed to use H2O2, Cetaphil soap and to place a small amount of bleach in the bath water. Encouraged short finger nails and report worsening or unremitting lesions  Complete Medication List: 1)  Proventil Hfa 108 (90 Base) Mcg/act Aers (Albuterol sulfate) .... 2 puffs as needed 2)  Synthroid 75 Mcg Tabs (Levothyroxine sodium) .... Take 1 tablet by mouth once a day 3)  Lexapro 20 Mg Tabs (Escitalopram oxalate) 4)  Lamictal 200 Mg Tabs (Lamotrigine) 5)  Lithium Carbonate 450 Mg Tbcr (Lithium carbonate) .... Take 1 tablet by mouth two times a day 6)  Asmanex 60 Metered Doses 220 Mcg/inh Aepb (Mometasone furoate) .... 2 puffs per day 7)  Bactrim Ds 800-160 Mg Tabs (Sulfamethoxazole-trimethoprim) .Marland Kitchen.. 1 tab by mouth two times a day x 10 days 8)  Bactroban 2 % Oint (Mupirocin) .... Apply to b/l nares two times a day x 7 days w/new qtip each application  Patient Instructions: 1)  Please schedule a follow-up appointment as needed if symptoms worsen or do not improve 2)  Take your antibiotic as prescribed until ALL of it is gone, but stop if you develop a rash or swelling and contact our office as soon as possible.  3)  Use Cetaphil soap as your body  soap 4)  Cleanse nares with hydrogen peroxide  two times a day prio to application of Bacroban 5)  Take a bath roughly twice a week and add 1 tbls of bleach to bath water each time. 6)  Whenever you start an antibiotic use a probiotic for at least a month, such as Align caps daily or a hi dose yogurt such as Activia. 7)  Also recommend Benefiber powder 2 tsp in an 8 oz beverage or mixed in food daily for intermittent history of loose stool. Prescriptions: BACTROBAN 2 % OINT (MUPIROCIN) apply to b/l nares two times a day x 7 days w/new qtip each application  #1 tube x 1   Entered and Authorized by:   Danise Edge MD   Signed by:   Danise Edge MD on 04/20/2010   Method used:   Electronically to        [facility]  [phone]* (retail)       [address]       [place], [place]  [address]       Ph: [phone] or [phone]  Fax: [phone]   RxID:   [id number] BACTRIM DS 800-160 MG TABS (SULFAMETHOXAZOLE-TRIMETHOPRIM) 1 tab by mouth two times a day x 10 days  #20 x 0   Entered and Authorized by:   Danise Edge MD   Signed by:   Danise Edge MD on 04/20/2010   Method used:   Electronically to        [facility]  [phone]* (retail)       [address]       [place], [place]  [address]       Ph: [phone] or [phone]       Fax: [phone]   RxID:   [id number]

## 2010-08-31 NOTE — Progress Notes (Signed)
Summary: Pt req work in acute with Dr Fabian Sharp today-triage  Phone Note Call from Patient Call back at [phone]    Caller: Patient Summary of Call: Pt called and has cough, chest congestion, stomach pain, pt has asthma and can't breath. Wants work in appt with Dr Fabian Sharp today. Pls advise. Initial call taken by: Lucy Antigua,  July 19, 2010 8:59 AM  Follow-up for Phone Call        Pt has normal URI symptoms, no fever, cough and congestion.  Will come in tomorrow to see Dr. Fabian Sharp. Follow-up by: Lynann Beaver CMA AAMA,  July 19, 2010 9:43 AM  Additional Follow-up for Phone Call Additional follow up Details #1::        ? how is she doing  she canceled her appt. Additional Follow-up by: Madelin Headings MD,  July 20, 2010 12:23 PM    Additional Follow-up for Phone Call Additional follow up Details #2::    No answer on pt's phone. Follow-up by: Lynann Beaver CMA AAMA,  July 20, 2010 1:47 PM  Additional Follow-up for Phone Call Additional follow up Details #3:: Details for Additional Follow-up Action Taken: Spoke to pt- pt states that she is feeling better but is coughing. Additional Follow-up by: Romualdo Bolk, CMA (AAMA),  July 20, 2010 2:14 PM

## 2010-08-31 NOTE — Assessment & Plan Note (Signed)
Summary: CPX // RS   Vital Signs:  Patient profile:   50 year old female Menstrual status:  irregular LMP:     07/13/2010 Height:      61.75 inches Weight:      146 pounds BMI:     27.02 Pulse rate:   60 / minute BP sitting:   110 / 70  (right arm) Cuff size:   regular  Vitals Entered By: Romualdo Bolk, CMA (AAMA) (August 02, 2010 9:53 AM)  Nutrition Counseling: Patient's BMI is greater than 25 and therefore counseled on weight management options. CC: CPX without pap. Pt has a gyn who does pap. LMP (date): 07/13/2010 LMP - Character: normal Menarche (age onset years): 13   Menses interval (days): varies Menstrual flow (days): 6-7 Enter LMP: 07/13/2010 Last PAP Result normal   History of Present Illness: Lisa Lee t for preventive visit  Since last visit  here  there have been no major changes in health status  . Assthma feels stable ocass cough  resolving uri  THyroid no change in meds  Mood :  stable on current meds  lithium levle done. GERD:   some signs  and taking otcs at times Right heel pan plantar fasciitis      Preventive Care Screening  Pap Smear:    Date:  01/27/2010    Results:  normal   Colonoscopy:    Date:  12/07/2009    Results:  normal   Prior Values:    Mammogram:  ASSESSMENT: Negative - BI-RADS 1^MM DIGITAL SCREENING (01/06/2010)    Last Tetanus Booster:  Historical (07/30/2002)   Preventive Screening-Counseling & Management  Alcohol-Tobacco     Alcohol drinks/day: <1     Alcohol type: wine     Smoking Status: never  Caffeine-Diet-Exercise     Caffeine use/day: 2     Does Patient Exercise: yes  Hep-HIV-STD-Contraception     Dental Visit-last 6 months yes     Sun Exposure-Excessive: no  Safety-Violence-Falls     Seat Belt Use: 100     Firearms in the Home: no firearms in the home     Smoke Detectors: yes     Fall Risk: no  Current Medications (verified): 1)  Proventil Hfa 108 (90 Base) Mcg/act Aers (Albuterol  Sulfate) .... 2 Puffs As Needed 2)  Synthroid 75 Mcg Tabs (Levothyroxine Sodium) .... Take 1 Tablet By Mouth Once A Day 3)  Lexapro 20 Mg  Tabs (Escitalopram Oxalate) 4)  Lamictal 100 Mg Tabs (Lamotrigine) 5)  Lithium Carbonate 300 Mg Caps (Lithium Carbonate) .Marland Kitchen.. 1 By Mouth Two Times A Day 6)  Asmanex 60 Metered Doses 220 Mcg/inh  Aepb (Mometasone Furoate) .... 2 Puffs Per Day 7)  Bactroban 2 % Oint (Mupirocin) .... Apply To B/l Nares Two Times A Day X 7 Days W/new Qtip Each Application 8)  Oxycodone-Acetaminophen 5-325 Mg Tabs (Oxycodone-Acetaminophen) .Marland Kitchen.. 1-2 By Mouth Every 4-6 Hours As Needed Pain  Allergies (verified): 1)  ! Penicillin  Past History:  Past medical, surgical, family and social histories (including risk factors) reviewed, and no changes noted (except as noted below).  Past Medical History: Reviewed history from 09/05/2009 and no changes required. Asthma Depression Allergic rhinitis Hypothyroidism Had colonoscopy in the past [facility] Counseling Dr. Buena Irish Dr. [facility]  Dr. Genella Mech  Past Surgical History: Reviewed history from 08/30/2009 and no changes required. hysteroscopy   uterin polypectomy  Jan 7th   Past History:  Care Management: Gynecology: Stefano Gaul Gastroenterology: Buccini  [facility]  Family History: Reviewed history from 02/27/2007 and no changes required. Family History of Arthritis Family History High cholesterol Family History Hypertension Family History Other cancer-Colon Family History of Cardiovascular disorder  Social History: Reviewed history from 09/05/2009 and no changes required. 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  to move    Dental Care w/in 6 mos.:  yes Sun Exposure-Excessive:  no Fall Risk:  no  Review of Systems  The patient denies  anorexia, fever, weight loss, weight gain, vision loss, decreased hearing, hoarseness, chest pain, syncope, dyspnea on exertion, peripheral edema, prolonged cough, headaches, hemoptysis, abdominal pain, melena, hematochezia, severe indigestion/heartburn, and enlarged lymph nodes.         get HB   otc pepcid  as needed   almost daily.    for 4-5 months.  ocass urgency    Physical Exam  General:  Well-developed,well-nourished,in no acute distress; alert,appropriate and cooperative throughout examination Head:  normocephalic and atraumatic.   Eyes:  PERRL, EOMs full, conjunctiva clear  Ears:  R ear normal, L ear normal, and no external deformities.   Nose:  no external deformity and no nasal discharge.   Mouth:  pharynx pink and moist.   Neck:  No deformities, masses, or tenderness noted. Breasts:  No mass, nodules, thickening, tenderness, bulging, retraction, inflamation, nipple discharge or skin changes noted.   Lungs:  Normal respiratory effort, chest expands symmetrically. Lungs are clear to auscultation, no crackles or wheezes. Heart:  Normal rate and regular rhythm. S1 and S2 normal without gallop, murmur, click, rub or other extra sounds. Abdomen:  Bowel sounds positive,abdomen soft and non-tender without masses, organomegaly or hernias noted. Genitalia:  per gyne Msk:  normal ROM, no joint warmth, no redness over joints, and no joint deformities.    tender right heel no deformity  Pulses:  pulses intact without delay   Extremities:  no clubbing cyanosis or edema  Neurologic:  alert & oriented X3, strength normal in all extremities, gait normal, and DTRs symmetrical and normal.   Skin:  turgor normal, color normal, no ecchymoses, and no petechiae.   Cervical Nodes:  No lymphadenopathy noted Axillary Nodes:  No palpable lymphadenopathy Inguinal Nodes:  No significant adenopathy Psych:  Normal eye contact, appropriate affect. Cognition appears normal.    Impression &  Recommendations:  Problem # 1:  Preventive Health Care (ICD-V70.0) Discussed nutrition,exercise,diet,healthy weight, vitamin D and calcium.  Orders: EKG w/ Interpretation (93000)  Problem # 2:  HYPOTHYROIDISM (ICD-244.9)  Her updated medication list for this problem includes:    Synthroid 75 Mcg Tabs (Levothyroxine sodium) .Marland Kitchen... Take 1 tablet by mouth once a day  Labs Reviewed: TSH: 1.84 (06/27/2010)    Chol: 247 (06/27/2010)   HDL: 52.60 (06/27/2010)   TG: 189.0 (06/27/2010)  Problem # 3:  HYPERLIPIDEMIA (ICD-272.4)  Labs Reviewed: SGOT: 20 (06/27/2010)   SGPT: 20 (06/27/2010)   HDL:52.60 (06/27/2010), 40.00 (06/15/2009)  Chol:247 (06/27/2010), 189 (06/15/2009)  Trig:189.0 (06/27/2010), 362.0 (06/15/2009)  Problem # 4:  ASTHMA (ICD-493.90)  Her updated medication list for this problem includes:    Proventil Hfa 108 (90 Base) Mcg/act Aers (Albuterol sulfate) .Marland Kitchen... 2 puffs as needed    Asmanex 60 Metered Doses 220 Mcg/inh Aepb (Mometasone furoate) .Marland Kitchen... 2 puffs per day  Pulmonary Functions Reviewed: O2 sat: 96 (04/22/2010)  Problem #  5:  ESOPHAGEAL REFLUX (ICD-530.81)  disc lifestyle intervention and  pt aware of weight loss  and  add medication s and follow  Problem # 6:  PLANTAR FASCIITIS, RIGHT (ICD-728.71) counseled  Discussed use of gel inserts, ice massage, and stretching exercises.   Problem # 7:  DEPRESSION (ICD-311) stable   per psych  Her updated medication list for this problem includes:    Lexapro 20 Mg Tabs (Escitalopram oxalate)  Complete Medication List: 1)  Proventil Hfa 108 (90 Base) Mcg/act Aers (Albuterol sulfate) .... 2 puffs as needed 2)  Synthroid 75 Mcg Tabs (Levothyroxine sodium) .... Take 1 tablet by mouth once a day 3)  Lexapro 20 Mg Tabs (Escitalopram oxalate) 4)  Lamictal 100 Mg Tabs (Lamotrigine) 5)  Lithium Carbonate 300 Mg Caps (Lithium carbonate) .Marland Kitchen.. 1 by mouth two times a day 6)  Asmanex 60 Metered Doses 220 Mcg/inh Aepb (Mometasone  furoate) .... 2 puffs per day 7)  Bactroban 2 % Oint (Mupirocin) .... Apply to b/l nares two times a day x 7 days w/new qtip each application 8)  Oxycodone-acetaminophen 5-325 Mg Tabs (Oxycodone-acetaminophen) .Marland Kitchen.. 1-2 by mouth every 4-6 hours as needed pain  Patient Instructions: 1)  consider    changing to    prilosec  for 3-4 weeks  and if ok then can change back to pepcid otc.   2)  weight loss will help Plantar fasciitis   and reflux and lipids. 3)  Lipid panel  in 6 months  follow up if needed dx hyperlipidemia  4)  otherwise check up in a year.    Orders Added: 1)  EKG w/ Interpretation [93000] 2)  Est. Patient 40-64 years [99396] 3)  Est. Patient Level III [81191]

## 2010-08-31 NOTE — Assessment & Plan Note (Signed)
Summary: asthma/dm   Vital Signs:  Patient profile:   50 year old female Menstrual status:  irregular Weight:      143 pounds O2 Sat:      99 % Pulse rate:   72 / minute BP sitting:   108 / 70  (left arm)  Vitals Entered By: Kyung Rudd, CMA (August 11, 2010 10:57 AM) CC: pt c/o asthma...cough     CC:  pt c/o asthma...cough  .  History of Present Illness: Patient presents to clinic as a workin for evaluation of cough. Developed URI several weeks ago in December with nasal drainage/congestion and cough productive for clear sputum. States resolved all symtpoms except cough which remains persistent. H/o asthma which was initially diagnosed as cough variant. Denies wheezing or dyspnea. Cough worse with talking. Has increased asmanex to two times a day and using proventil three times a day both of which have helped. No other exacerbating or alleviating factors.  Current Medications (verified): 1)  Proventil Hfa 108 (90 Base) Mcg/act Aers (Albuterol Sulfate) .... 2 Puffs As Needed 2)  Synthroid 75 Mcg Tabs (Levothyroxine Sodium) .... Take 1 Tablet By Mouth Once A Day 3)  Lexapro 20 Mg  Tabs (Escitalopram Oxalate) 4)  Lamictal 100 Mg Tabs (Lamotrigine) 5)  Lithium Carbonate 300 Mg Caps (Lithium Carbonate) .Marland Kitchen.. 1 By Mouth Two Times A Day 6)  Asmanex 60 Metered Doses 220 Mcg/inh  Aepb (Mometasone Furoate) .... 2 Puffs Per Day  Allergies (verified): 1)  ! Penicillin  Past History:  Past medical, surgical, family and social histories (including risk factors) reviewed, and no changes noted (except as noted below).  Past Medical History: Reviewed history from 09/05/2009 and no changes required. Asthma Depression Allergic rhinitis Hypothyroidism Had colonoscopy in the past [facility] Counseling Dr. Buena Irish Dr. [facility]  Dr. Genella Mech  Past Surgical History: Reviewed history from 08/30/2009 and no changes required. hysteroscopy   uterin polypectomy   Jan 7th   Family History: Reviewed history from 02/27/2007 and no changes required. Family History of Arthritis Family History High cholesterol Family History Hypertension Family History Other cancer-Colon Family History of Cardiovascular disorder  Social History: Reviewed history from 08/02/2010 and no changes required. 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  to move   Review of Systems General:  Denies chills, fatigue, and fever. Eyes:  Denies discharge, eye irritation, and eye pain. ENT:  Denies ear discharge, earache, hoarseness, nasal congestion, postnasal drainage, and sore throat. Resp:  Complains of cough; denies chest discomfort, coughing up blood, shortness of breath, and wheezing.  Physical Exam  General:  Well-developed,well-nourished,in no acute distress; alert,appropriate and cooperative throughout examination Head:  Normocephalic and atraumatic without obvious abnormalities. No apparent alopecia or balding. Eyes:  pupils equal, pupils round, pupils react to accomodation, and corneas and lenses clear.   Ears:  External ear exam shows no significant lesions or deformities.  Otoscopic examination reveals clear canals, tympanic membranes are intact bilaterally without bulging, retraction, inflammation or discharge. Hearing is grossly normal bilaterally. Nose:  External nasal examination shows no deformity or inflammation. Nasal mucosa are pink and moist without lesions or exudates. Mouth:  Oral mucosa and oropharynx without lesions or exudates.  Teeth in good repair. Neck:  No deformities, masses, or tenderness noted. Lungs:  Normal respiratory effort, chest expands symmetrically. Lungs are clear to auscultation, no  crackles or wheezes. Heart:  Normal rate and regular rhythm. S1 and S2 normal without gallop, murmur, click, rub or other extra  sounds.   Impression & Recommendations:  Problem # 1:  COUGH (ICD-786.2) Assessment New No clinical evidence of current infection. Suspect post-bronchitic cough. Rf proventil. Attempt medrol dosepak. Followup if no improvement or worsening.  Complete Medication List: 1)  Proventil Hfa 108 (90 Base) Mcg/act Aers (Albuterol sulfate) .... 2 puffs q4-6 hours prn 2)  Synthroid 75 Mcg Tabs (Levothyroxine sodium) .... Take 1 tablet by mouth once a day 3)  Lexapro 20 Mg Tabs (Escitalopram oxalate) 4)  Lamictal 100 Mg Tabs (Lamotrigine) 5)  Lithium Carbonate 300 Mg Caps (Lithium carbonate) .Marland Kitchen.. 1 by mouth two times a day 6)  Asmanex 60 Metered Doses 220 Mcg/inh Aepb (Mometasone furoate) .... 2 puffs per day 7)  Medrol (pak) 4 Mg Tabs (Methylprednisolone) .... As directed Prescriptions: PROVENTIL HFA 108 (90 BASE) MCG/ACT AERS (ALBUTEROL SULFATE) 2 puffs q4-6 hours prn  #1 x 11   Entered and Authorized by:   Edwyna Perfect MD   Signed by:   Edwyna Perfect MD on 08/11/2010   Method used:   Print then Give to Patient   RxID:   [id number] MEDROL (PAK) 4 MG TABS (METHYLPREDNISOLONE) as directed  #1 x 0   Entered and Authorized by:   Edwyna Perfect MD   Signed by:   Edwyna Perfect MD on 08/11/2010   Method used:   Print then Give to Patient   RxID:   [id number]    Orders Added: 1)  Est. Patient Level III [84696]

## 2010-08-31 NOTE — Progress Notes (Signed)
Summary: Pt req refill of Synthroid to [facility]  Phone Note Refill Request Call back at [phone] Message from:  Patient on August 03, 2010 10:07 AM  Refills Requested: Medication #1:  SYNTHROID 75 MCG TABS Take 1 tablet by mouth once a day [BMN]   Dosage confirmed as above?Dosage Confirmed   Supply Requested: 3 months Pls call in to [facility]    Method Requested: Telephone to Pharmacy Initial call taken by: Lucy Antigua,  August 03, 2010 10:07 AM Caller: Patient    Prescriptions: SYNTHROID 75 MCG TABS (LEVOTHYROXINE SODIUM) Take 1 tablet by mouth once a day Brand medically necessary #90 x 1   Entered by:   Romualdo Bolk, CMA (AAMA)   Authorized by:   Madelin Headings MD   Signed by:   Romualdo Bolk, CMA (AAMA) on 08/03/2010   Method used:   Electronically to        [facility]* (retail)       [address]       [address] [employer] [address]       [place], [place]  [address]       Ph: [phone]       Fax: [phone]   RxID:   [id number]

## 2010-10-15 LAB — COMPREHENSIVE METABOLIC PANEL WITH GFR
ALT: 33 U/L (ref 0–35)
AST: 33 U/L (ref 0–37)
Albumin: 4.2 g/dL (ref 3.5–5.2)
Alkaline Phosphatase: 58 U/L (ref 39–117)
BUN: 16 mg/dL (ref 6–23)
CO2: 23 meq/L (ref 19–32)
Calcium: 9.9 mg/dL (ref 8.4–10.5)
Chloride: 104 meq/L (ref 96–112)
Creatinine, Ser: 1.02 mg/dL (ref 0.4–1.2)
GFR calc non Af Amer: 58 mL/min — ABNORMAL LOW
Glucose, Bld: 106 mg/dL — ABNORMAL HIGH (ref 70–99)
Potassium: 4 meq/L (ref 3.5–5.1)
Sodium: 136 meq/L (ref 135–145)
Total Bilirubin: 0.5 mg/dL (ref 0.3–1.2)
Total Protein: 6.7 g/dL (ref 6.0–8.3)

## 2010-10-15 LAB — CBC
HCT: 42.6 % (ref 36.0–46.0)
Hemoglobin: 14.3 g/dL (ref 12.0–15.0)
Hemoglobin: 14.8 g/dL (ref 12.0–15.0)
MCHC: 33.7 g/dL (ref 30.0–36.0)
MCHC: 34.3 g/dL (ref 30.0–36.0)
MCV: 93 fL (ref 78.0–100.0)
Platelets: 306 10*3/uL (ref 150–400)
Platelets: 283 10*3/uL (ref 150–400)
RBC: 4.58 MIL/uL (ref 3.87–5.11)
RDW: 12.1 % (ref 11.5–15.5)
WBC: 9.6 10*3/uL (ref 4.0–10.5)

## 2010-10-15 LAB — BASIC METABOLIC PANEL WITH GFR
BUN: 12 mg/dL (ref 6–23)
CO2: 25 meq/L (ref 19–32)
Calcium: 9.8 mg/dL (ref 8.4–10.5)
Chloride: 105 meq/L (ref 96–112)
Creatinine, Ser: 0.86 mg/dL (ref 0.4–1.2)
GFR calc non Af Amer: >60 mL/min
Glucose, Bld: 90 mg/dL (ref 70–99)
Potassium: 4.2 meq/L (ref 3.5–5.1)
Sodium: 138 meq/L (ref 135–145)

## 2010-10-15 LAB — URINALYSIS, ROUTINE W REFLEX MICROSCOPIC
Bilirubin Urine: NEGATIVE
Bilirubin Urine: NEGATIVE
Glucose, UA: NEGATIVE mg/dL
Glucose, UA: NEGATIVE mg/dL
Hgb urine dipstick: NEGATIVE
Ketones, ur: NEGATIVE mg/dL
Nitrite: NEGATIVE
Nitrite: NEGATIVE
Protein, ur: NEGATIVE mg/dL
Specific Gravity, Urine: 1.01 (ref 1.005–1.030)
Specific Gravity, Urine: 1.01 (ref 1.005–1.030)
Urobilinogen, UA: 0.2 mg/dL (ref 0.0–1.0)
pH: 6.5 (ref 5.0–8.0)
pH: 7 (ref 5.0–8.0)

## 2010-10-15 LAB — PREGNANCY, URINE: Preg Test, Ur: NEGATIVE

## 2010-10-15 LAB — URINE MICROSCOPIC-ADD ON

## 2010-10-15 LAB — DIFFERENTIAL
Eosinophils Relative: 3 % (ref 0–5)
Lymphocytes Relative: 19 % (ref 12–46)
Lymphs Abs: 2 10*3/uL (ref 0.7–4.0)
Monocytes Relative: 5 % (ref 3–12)

## 2010-10-15 LAB — LIPASE, BLOOD: Lipase: 21 U/L (ref 11–59)

## 2010-10-15 LAB — AMYLASE: Amylase: 82 U/L (ref 0–105)

## 2011-03-16 ENCOUNTER — Other Ambulatory Visit (HOSPITAL_COMMUNITY): Payer: Self-pay | Admitting: Obstetrics and Gynecology

## 2011-04-05 ENCOUNTER — Other Ambulatory Visit: Payer: Self-pay | Admitting: Internal Medicine

## 2011-04-05 DIAGNOSIS — E785 Hyperlipidemia, unspecified: Secondary | ICD-10-CM

## 2011-04-05 NOTE — Telephone Encounter (Signed)
Pt needs to schedule lab appointment. Order placed in epic

## 2011-06-12 ENCOUNTER — Other Ambulatory Visit (HOSPITAL_COMMUNITY): Payer: Self-pay | Admitting: Obstetrics and Gynecology

## 2011-06-12 DIAGNOSIS — Z1231 Encounter for screening mammogram for malignant neoplasm of breast: Secondary | ICD-10-CM

## 2011-07-05 ENCOUNTER — Other Ambulatory Visit: Payer: Self-pay | Admitting: Internal Medicine

## 2011-07-09 ENCOUNTER — Other Ambulatory Visit: Payer: Self-pay | Admitting: Internal Medicine

## 2011-07-11 ENCOUNTER — Ambulatory Visit (HOSPITAL_COMMUNITY)
Admission: RE | Admit: 2011-07-11 | Discharge: 2011-07-11 | Disposition: A | Discharge status: Home / Self Care | Payer: 59 | Source: Ambulatory Visit | Attending: Obstetrics and Gynecology | Admitting: Obstetrics and Gynecology

## 2011-07-11 DIAGNOSIS — Z1231 Encounter for screening mammogram for malignant neoplasm of breast: Secondary | ICD-10-CM | POA: Insufficient documentation

## 2011-07-20 ENCOUNTER — Encounter: Payer: Self-pay | Admitting: Internal Medicine

## 2011-08-08 ENCOUNTER — Ambulatory Visit (INDEPENDENT_AMBULATORY_CARE_PROVIDER_SITE_OTHER): Payer: 59 | Admitting: Internal Medicine

## 2011-08-08 ENCOUNTER — Encounter: Payer: Self-pay | Admitting: Internal Medicine

## 2011-08-08 VITALS — BP 120/60 | HR 66 | Wt 141.0 lb

## 2011-08-08 DIAGNOSIS — J309 Allergic rhinitis, unspecified: Secondary | ICD-10-CM

## 2011-08-08 DIAGNOSIS — R197 Diarrhea, unspecified: Secondary | ICD-10-CM

## 2011-08-08 DIAGNOSIS — Z8249 Family history of ischemic heart disease and other diseases of the circulatory system: Secondary | ICD-10-CM

## 2011-08-08 DIAGNOSIS — E039 Hypothyroidism, unspecified: Secondary | ICD-10-CM

## 2011-08-08 DIAGNOSIS — E785 Hyperlipidemia, unspecified: Secondary | ICD-10-CM

## 2011-08-08 DIAGNOSIS — J45909 Unspecified asthma, uncomplicated: Secondary | ICD-10-CM

## 2011-08-08 MED ORDER — SYNTHROID 75 MCG PO TABS: 75.0000 ug | ORAL_TABLET | Freq: Every day | ORAL | Status: DC

## 2011-08-08 NOTE — Progress Notes (Signed)
  Subjective:    Patient ID: Lisa Lee, female    DOB: 25-Jul-1961, 51 y.o.   MRN: [id number]  HPI Patient comes in today for follow up of  multiple medical problems.   No major change in health status since last visit . MOOD: sees psych keely virgil and now off of lamictal doing ok  On lithium and lexapro. THyroid : no change in meds  Taking brand had labs done at work 11 12  No major injury NOw on vit d per gyne Has some ? About her risk of heart disease and what she should be doing for prevention. No cp sob or other heart sx .   Tried fish oil ? Advice  Sister had  heaert disease  Cabg.  57    Diarrhea  At times ? Stress   colonscopy  Review of Systems NO cp sob syncope exercise intolerance GI as abobe neg gu no bleeding  Vision or hearing changes right tarm pain elbow down at times recently no lifting but increase computer other use recently  No numbness but feels week.  Allergy asthma stable   Past history family history social history reviewed in the electronic medical record.     Objective:   Physical Exam WDWN in nad   HEENT: Normocephalic ;atraumatic , Eyes;  PERRL, EOMs  Full, lids and conjunctiva clear,,Ears: no deformities, canals nl, TM landmarks normal, Nose: no deformity or discharge  Mouth : OP clear without lesion or edema . Neck: Supple without adenopathy or masses or bruits Chest:  Clear to A&P without wheezes rales or rhonchi CV:  S1-S2 no gallops or murmurs peripheral perfusion is normal Abdomen:  Sof,t normal bowel sounds without hepatosplenomegaly, no guarding rebound or masses no CVA tenderness No clubbing cyanosis or edema right arm nl rom some tenderness at epiconduyls no atrophy ? Slight dec grip . Reviewed labs obtained during visit from 11 12  Nl cmp tsh  2.94 TC 217 tg 159 hdl 49 ldl 139 ratio 4.7 down from 5.2        Assessment & Plan:  Hypothyroid   adequate replacement as of November continue same brand medicine recheck in a year  Mood meds  per specialist seemed to be stable  Diarrhea loose stools   this isn't particularly a change in bowel habits quit twice a day as discussed possibility of celiac disease seems to be worse when she's under stress it is safe to use Imodium at this point in time she is up-to-date on her colonoscopy if not getting better consider seeing Dr. Clent Ridges  Hyperlipidemia a bit improved from last year  Family history of premature heart disease  significant disease in her sister before age 9  discussed risk assessment based on her age and numbers may be different than adding family history. She has appropriate  questions about this :considering whether statin medicines would be helpful for her.  Would recommend  seeing cardiology for input on risk assessment and whether she would be a candidate for getting a coronary artery calcium score or other  evaluation to see if she fits into the intermediate risk versus lower risk category.   Right arm prob tendinitis or over use  And rx as such.

## 2011-08-08 NOTE — Patient Instructions (Signed)
Ok to take   immodium  If needed. Consider probiotic  Also.   Consider seeing GI   Consider ibs and celiac.   Will do a referral for risk assessment for cardiac disease.

## 2011-08-09 ENCOUNTER — Encounter: Payer: Self-pay | Admitting: Internal Medicine

## 2011-08-09 DIAGNOSIS — Z8249 Family history of ischemic heart disease and other diseases of the circulatory system: Secondary | ICD-10-CM | POA: Insufficient documentation

## 2011-08-09 DIAGNOSIS — R197 Diarrhea, unspecified: Secondary | ICD-10-CM | POA: Insufficient documentation

## 2011-08-09 HISTORY — DX: Diarrhea, unspecified: R19.7

## 2011-08-28 ENCOUNTER — Encounter: Payer: Self-pay | Admitting: Cardiology

## 2011-08-28 ENCOUNTER — Ambulatory Visit (INDEPENDENT_AMBULATORY_CARE_PROVIDER_SITE_OTHER): Payer: 59 | Admitting: Cardiology

## 2011-08-28 ENCOUNTER — Encounter: Payer: Self-pay | Admitting: *Deleted

## 2011-08-28 DIAGNOSIS — Z9189 Other specified personal risk factors, not elsewhere classified: Secondary | ICD-10-CM | POA: Insufficient documentation

## 2011-08-28 DIAGNOSIS — Z8249 Family history of ischemic heart disease and other diseases of the circulatory system: Secondary | ICD-10-CM

## 2011-08-28 DIAGNOSIS — E785 Hyperlipidemia, unspecified: Secondary | ICD-10-CM

## 2011-08-28 NOTE — Patient Instructions (Signed)
Take aspirin 81mg  daily.  Exercise 5-6 times a week for 30-45 minutes.  Schedule an appointment for a coronary artery scoring CT scan.   Schedule an appointment to see Dr Shirlee Latch 2-3 weeks after the scan.

## 2011-08-28 NOTE — Progress Notes (Signed)
PCP: Dr. Fabian Sharp  51 yo with history of hyperlipidemia presents for cardiac risk assessment.  Patient has been concerned about her CAD risk because she had a sister with an anterior MI and CABG at age 82.  Symptomatically, patient has been doing well.  She works as a Publishing rights manager in the NICU.  She does not smoke.  No formal exercise.  No exertional dyspnea or chest pain; no exercise limitations.  BP has not been elevated.  LDL cholesterol has been elevated.    ECG: NSR, normal  Labs (12/12): K 4.5, creatinine 0.84, LDL 139, HDL 46, TSH normal  PMH: 1. Hypothyroidism 2. Hyperlipidemia 3. Bipolar disorder 4. 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  Never smoked.  Has children.    FH:  Sister with anterior MI and CABG at 64, father with CAD diagnosed at 42, mother with CVA in her 14s, daughter with PE after childbirth.   ROS: All systems reviewed and negative except as per HPI.   Current Outpatient Prescriptions  Medication Sig Dispense Refill  . albuterol (PROVENTIL HFA) 108 (90 BASE) MCG/ACT inhaler Inhale 2 puffs into the lungs every 6 (six) hours as needed.        . ASMANEX 60 METERED DOSES 220 MCG/INH inhaler USE 2 PUFFS BY MOUTH DAILY  3 Inhaler  0  . co-enzyme Q-10 30 MG capsule Take 30 mg by mouth 3 (three) times daily.      Marland Kitchen escitalopram (LEXAPRO) 20 MG tablet Take 20 mg by mouth daily.        Marland Kitchen lithium carbonate 300 MG capsule Take 300 mg by mouth 3 (three) times daily with meals.        . MULTIPLE VITAMIN PO Take by mouth.      . SYNTHROID 75 MCG tablet Take 1 tablet (75 mcg total) by mouth daily.  90 tablet  3  . Vitamin D, Ergocalciferol, (DRISDOL) 50000 UNITS CAPS Take 50,000 Units by mouth.      Marland Kitchen aspirin EC 81 MG tablet Take 1 tablet (81 mg total) by mouth daily.      . fish oil-omega-3 fatty acids 1000 MG capsule Take 2 g by mouth daily.        BP 124/76  Pulse 70  Ht 5' 1.75" (1.568 m)  Wt 63.504 kg (140 lb)  BMI 25.81 kg/m2 General:  NAD Neck: No JVD, no thyromegaly or thyroid nodule.  Lungs: Clear to auscultation bilaterally with normal respiratory effort. CV: Nondisplaced PMI.  Heart regular S1/S2, no S3/S4, no murmur.  No peripheral edema.  No carotid bruit.  Normal pedal pulses.  Abdomen: Soft, nontender, no hepatosplenomegaly, no distention.  Skin: Intact without lesions or rashes.  Neurologic: Alert and oriented x 3.  Psych: Normal affect. Extremities: No clubbing or cyanosis.  HEENT: Normal.

## 2011-08-28 NOTE — Assessment & Plan Note (Signed)
Patient has no significant cardiopulmonary symptoms.  Risk factors include family history of premature CAD and hyperlipidemia.  She does not smoke.  It would be reasonable for her to take ASA 81 mg daily.  She needs to start a regular exercise regimen, would recommend 30-45 minutes aerobic exercise 5-6 times a week.  Finally, I would recommend further risk stratification given her family history.  I will set her up for a coronary calcium score.  If she has significant coronary calcium, would probably recommend treating her with a statin.

## 2011-09-10 ENCOUNTER — Ambulatory Visit (HOSPITAL_COMMUNITY)
Admission: RE | Admit: 2011-09-10 | Discharge: 2011-09-10 | Disposition: A | Discharge status: Home / Self Care | Payer: 59 | Source: Ambulatory Visit | Attending: Cardiology | Admitting: Cardiology

## 2011-09-10 DIAGNOSIS — E785 Hyperlipidemia, unspecified: Secondary | ICD-10-CM

## 2011-09-10 DIAGNOSIS — Z8249 Family history of ischemic heart disease and other diseases of the circulatory system: Secondary | ICD-10-CM

## 2011-09-10 DIAGNOSIS — J984 Other disorders of lung: Secondary | ICD-10-CM | POA: Insufficient documentation

## 2011-09-13 ENCOUNTER — Ambulatory Visit (INDEPENDENT_AMBULATORY_CARE_PROVIDER_SITE_OTHER): Payer: 59 | Admitting: Family

## 2011-09-13 ENCOUNTER — Encounter: Payer: Self-pay | Admitting: Family

## 2011-09-13 VITALS — BP 120/76 | Temp 98.3°F | Wt 141.0 lb

## 2011-09-13 DIAGNOSIS — J019 Acute sinusitis, unspecified: Secondary | ICD-10-CM

## 2011-09-13 DIAGNOSIS — J209 Acute bronchitis, unspecified: Secondary | ICD-10-CM

## 2011-09-13 DIAGNOSIS — R05 Cough: Secondary | ICD-10-CM

## 2011-09-13 MED ORDER — PREDNISONE 20 MG PO TABS: 40.0000 mg | ORAL_TABLET | Freq: Every day | ORAL | Status: AC

## 2011-09-13 MED ORDER — AZITHROMYCIN 250 MG PO TABS: ORAL_TABLET | ORAL | Status: AC

## 2011-09-13 MED ORDER — HYDROCOD POLST-CHLORPHEN POLST 10-8 MG/5ML PO LQCR: 5.0000 mL | Freq: Two times a day (BID) | ORAL | Status: DC

## 2011-09-13 NOTE — Progress Notes (Signed)
Subjective:    Patient ID: Lisa Lee, female    DOB: 11-Apr-1961, 51 y.o.   MRN: [id number]  HPI 51 year old white female, nonsmoker, patient (age 51) and Dr. Fabian Sharp is in today with complaints of cough, congestion, sneezing this morning for 2 weeks. Initially her cough was productive with clear phlegm, it is now changed to yellow. She's been taking over-the-counter medication with no relief. She's also been using her albuterol inhaler that's not helping. Patient has wheezing but denies any lightheadedness, dizziness, chest pain, palpitations, or edema.   Review of Systems  Constitutional: Negative.   HENT: Positive for congestion, rhinorrhea, postnasal drip and sinus pressure.   Eyes: Negative.   Respiratory: Positive for cough and wheezing.   Gastrointestinal: Negative.   Genitourinary: Negative.   Musculoskeletal: Negative.   Neurological: Negative.   Hematological: Negative.   Psychiatric/Behavioral: Negative.    Past Medical History  Diagnosis Date  . Asthma   . Depression   . Allergic rhinitis   . Hypothyroidism     History   Social History  . Marital Status: Legally Separated    Spouse Name: N/A    Number of Children: N/A  . Years of Education: N/A   Occupational History  . Not on file.   Social History Main Topics  . Smoking status: Never Smoker   . Smokeless tobacco: Not on file  . Alcohol Use: Yes  . Drug Use: No  . Sexually Active: Not on file   Other Topics Concern  . 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  And [employer].  NursingDivorcedRegular exercise- yesHH of 2Pets 2 cats 1 dog to move    Past Surgical History  Procedure Date  . Hysteroscopy     uterine polypectomy Jan 7th    Family History  Problem Relation Age of Onset  . Arthritis    . Hyperlipidemia    . Hypertension    . Colon cancer    . Heart disease Sister 86    cabg stent    Allergies  Allergen Reactions  . Penicillins      Current Outpatient Prescriptions on File Prior to Visit  Medication Sig Dispense Refill  . albuterol (PROVENTIL HFA) 108 (90 BASE) MCG/ACT inhaler Inhale 2 puffs into the lungs every 6 (six) hours as needed.        . ASMANEX 60 METERED DOSES 220 MCG/INH inhaler USE 2 PUFFS BY MOUTH DAILY  3 Inhaler  0  . aspirin EC 81 MG tablet Take 1 tablet (81 mg total) by mouth daily.      Marland Kitchen co-enzyme Q-10 30 MG capsule Take 30 mg by mouth 3 (three) times daily.      Marland Kitchen escitalopram (LEXAPRO) 20 MG tablet Take 20 mg by mouth daily.        Marland Kitchen lithium carbonate 300 MG capsule Take 300 mg by mouth 3 (three) times daily with meals.        . MULTIPLE VITAMIN PO Take by mouth.      . SYNTHROID 75 MCG tablet Take 1 tablet (75 mcg total) by mouth daily.  90 tablet  3  . Vitamin D, Ergocalciferol, (DRISDOL) 50000 UNITS CAPS Take 50,000 Units by mouth.      . fish oil-omega-3 fatty acids 1000 MG capsule Take 2 g by mouth daily.        BP 120/76  Temp(Src) 98.3 F (36.8 C) (Oral)  Wt 141  lb (63.957 kg)  LMP 02/04/2013chart    Objective:   Physical Exam  Constitutional: She is oriented to person, place, and time. She appears well-developed and well-nourished.  HENT:  Right Ear: External ear normal.  Left Ear: External ear normal.  Nose: Nose normal.  Mouth/Throat: Oropharynx is clear and moist.  Neck: Normal range of motion. Neck supple.  Cardiovascular: Normal rate, regular rhythm and normal heart sounds.   Pulmonary/Chest: Effort normal. She has wheezes.  Musculoskeletal: Normal range of motion.  Neurological: She is alert and oriented to person, place, and time.  Skin: Skin is warm and dry.  Psychiatric: She has a normal mood and affect.          Assessment & Plan:  Assessment: Acute bronchitis, sinusitis, wheezing  Plan: Z-Pak as directed due to her allergies. Tussionex pen kinetic a teaspoon twice a day when necessary cough. Patient will start prednisone 40 mg a day x5 days if her symptoms  are not improving by tomorrow. Rest. Drink fluids. Call if symptoms worsen or persist, recheck as scheduled and when necessary.

## 2011-09-13 NOTE — Patient Instructions (Signed)

## 2011-09-25 ENCOUNTER — Ambulatory Visit (INDEPENDENT_AMBULATORY_CARE_PROVIDER_SITE_OTHER): Payer: 59 | Admitting: Cardiology

## 2011-09-25 ENCOUNTER — Encounter: Payer: Self-pay | Admitting: Cardiology

## 2011-09-25 DIAGNOSIS — Z9189 Other specified personal risk factors, not elsewhere classified: Secondary | ICD-10-CM

## 2011-09-25 NOTE — Patient Instructions (Signed)
Your physician recommends that you schedule a follow-up appointment in: as needed  

## 2011-09-25 NOTE — Progress Notes (Signed)
PCP: Dr. Fabian Sharp  51 yo with history of hyperlipidemia presented initially for cardiac risk assessment.  Patient has been concerned about her CAD risk because she had a sister with an anterior MI and CABG at age 42.  Symptomatically, patient has been doing well.  She works as a Publishing rights manager in the NICU.  She does not smoke.  No formal exercise.  No exertional dyspnea or chest pain; no exercise limitations.  BP has not been elevated.  LDL cholesterol has been elevated.    Coronary calcium score CT was done.  This was a low risk study with calcium score of 0.   ECG: NSR, normal  Labs (12/12): K 4.5, creatinine 0.84, LDL 139, HDL 46, TSH normal  PMH: 1. Hypothyroidism 2. Hyperlipidemia 3. Bipolar disorder 4. Asthma 5. Coronary calcium score = 0 in 2/13.   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  Never smoked.  Has children.    FH:  Sister with anterior MI and CABG at 3, father with CAD diagnosed at 50, mother with CVA in her 58s, daughter with PE after childbirth.    Current Outpatient Prescriptions  Medication Sig Dispense Refill  . albuterol (PROVENTIL HFA) 108 (90 BASE) MCG/ACT inhaler Inhale 2 puffs into the lungs every 6 (six) hours as needed.        . ASMANEX 60 METERED DOSES 220 MCG/INH inhaler USE 2 PUFFS BY MOUTH DAILY  3 Inhaler  0  . aspirin EC 81 MG tablet Take 1 tablet (81 mg total) by mouth daily.      Marland Kitchen co-enzyme Q-10 30 MG capsule Take 30 mg by mouth 3 (three) times daily.      Marland Kitchen escitalopram (LEXAPRO) 20 MG tablet Take 20 mg by mouth daily.        Marland Kitchen lithium carbonate 300 MG capsule Take 300 mg by mouth 3 (three) times daily with meals.        . MULTIPLE VITAMIN PO Take by mouth.      . SYNTHROID 75 MCG tablet Take 1 tablet (75 mcg total) by mouth daily.  90 tablet  3  . Vitamin D, Ergocalciferol, (DRISDOL) 50000 UNITS CAPS Take 50,000 Units by mouth.        BP 100/60  Pulse 70  Ht 5\' 2"  (1.575 m)  Wt 141 lb (63.957 kg)  BMI 25.79 kg/m2  LMP  09/03/2011 General: NAD Neck: No JVD, no thyromegaly or thyroid nodule.  Lungs: Clear to auscultation bilaterally with normal respiratory effort. CV: Nondisplaced PMI.  Heart regular S1/S2, no S3/S4, no murmur.  No peripheral edema.  No carotid bruit.  Normal pedal pulses.  Abdomen: Soft, nontender, no hepatosplenomegaly, no distention.  Neurologic: Alert and oriented x 3.  Psych: Normal affect. Extremities: No clubbing or cyanosis.

## 2011-09-25 NOTE — Assessment & Plan Note (Signed)
Patient has no significant cardiopulmonary symptoms.  Risk factors include family history of premature CAD and hyperlipidemia.  She does not smoke.  Coronary calcium score was 0, which puts her in a low risk cohort.  Given her family history, I still think It would be reasonable for her to take ASA 81 mg daily.  She needs to start a regular exercise regimen, would recommend 30-45 minutes aerobic exercise 5-6 times a week.  She plans to try to follow a Mediterranean diet.

## 2011-11-01 ENCOUNTER — Ambulatory Visit (INDEPENDENT_AMBULATORY_CARE_PROVIDER_SITE_OTHER): Payer: 59 | Admitting: Internal Medicine

## 2011-11-01 ENCOUNTER — Encounter: Payer: Self-pay | Admitting: Internal Medicine

## 2011-11-01 VITALS — BP 120/68 | HR 71 | Temp 98.3°F | Wt 141.0 lb

## 2011-11-01 DIAGNOSIS — T887XXA Unspecified adverse effect of drug or medicament, initial encounter: Secondary | ICD-10-CM

## 2011-11-01 DIAGNOSIS — R05 Cough: Secondary | ICD-10-CM

## 2011-11-01 DIAGNOSIS — J45909 Unspecified asthma, uncomplicated: Secondary | ICD-10-CM

## 2011-11-01 DIAGNOSIS — J309 Allergic rhinitis, unspecified: Secondary | ICD-10-CM

## 2011-11-01 DIAGNOSIS — Z8249 Family history of ischemic heart disease and other diseases of the circulatory system: Secondary | ICD-10-CM

## 2011-11-01 DIAGNOSIS — R059 Cough, unspecified: Secondary | ICD-10-CM | POA: Insufficient documentation

## 2011-11-01 HISTORY — DX: Unspecified adverse effect of drug or medicament, initial encounter: T88.7XXA

## 2011-11-01 MED ORDER — HYDROCODONE-HOMATROPINE 5-1.5 MG/5ML PO SYRP: 5.0000 mL | ORAL_SOLUTION | ORAL | Status: DC | PRN

## 2011-11-01 NOTE — Progress Notes (Signed)
  Subjective:    Patient ID: Lisa Lee, female    DOB: [birth year], 51 y.o.   MRN: [id number]  HPI  Patient comes in today for SDA for  new problem evaluation.  Onset about 1 week ago of  Bad coughing  ? If asthma flaring. Not that sick otherwise today   Had a uri type sx in the past     On inhaler   For a short while.  Some help  No fever  Except at  onset    Had z pack in feb and got better  From that illness and didnt take the prednisone then.  Was thinking of starting this  But wanted med opinion first .  Had  Long acting cough med that kept her up at night  Long acting refers short acting   Review of Systems Neg cp fever syncope NVD   Past history family history social history reviewed in the electronic medical record.     Objective:   Physical Exam  BP 120/68  Pulse 71  Temp 98.3 F (36.8 C)  Wt 141 lb (63.957 kg)  SpO2 98%  LMP 10/25/2011  WDWn in nad   mildly hoarse generally well.  HEENT: Normocephalic ;atraumatic , Eyes;  PERRL  lids and conjunctiva clear,,Ears: no deformities, canals nl, TM landmarks normal, Nose: no deformity or discharge  Mouth : OP clear without lesion or edema . Neck: Supple without adenopathy or masses or bruits Chest:  Clear to A&P without wheezes rales or rhonchi except on forced expiration and with coughing  CV:  S1-S2 no gallops or murmurs peripheral perfusion is normal     Assessment & Plan:  Cough   ? Post infectious vs asthmatic    Ok to take the prednisone as discussed    Higher dose on day one  No indication for antibiotic   Cough med given  Had se of 12 hours cough med.   See cards eval. Low  risk CAC score.

## 2011-11-01 NOTE — Patient Instructions (Signed)
Take the prednisone  60 mg today and then 60 - 40 per day  Until done.  Cough med as tolerated.    you still may cough for another week or so.

## 2011-11-19 ENCOUNTER — Telehealth: Payer: Self-pay | Admitting: *Deleted

## 2011-11-19 DIAGNOSIS — R05 Cough: Secondary | ICD-10-CM

## 2011-11-19 NOTE — Telephone Encounter (Signed)
Get a chest x ray  If not done  For this coughing   Then make plan .  Consider seeing [facility] .

## 2011-11-19 NOTE — Telephone Encounter (Signed)
Pls advise.  

## 2011-11-19 NOTE — Telephone Encounter (Signed)
Pt is still coughing and concerned that this has been going on since mid march.  Steroids or allergy meds did not help.  Asking for advice for "productive cough"  Fluid is not colored.  No fever.

## 2011-11-20 NOTE — Telephone Encounter (Signed)
LMTCB

## 2011-11-20 NOTE — Telephone Encounter (Signed)
Pt will go for chest xray Wednesday.

## 2011-11-21 ENCOUNTER — Ambulatory Visit (INDEPENDENT_AMBULATORY_CARE_PROVIDER_SITE_OTHER)
Admission: RE | Admit: 2011-11-21 | Discharge: 2011-11-21 | Disposition: A | Discharge status: Home / Self Care | Payer: 59 | Source: Ambulatory Visit | Attending: Internal Medicine | Admitting: Internal Medicine

## 2011-11-21 DIAGNOSIS — R05 Cough: Secondary | ICD-10-CM

## 2011-11-21 DIAGNOSIS — R059 Cough, unspecified: Secondary | ICD-10-CM

## 2011-11-21 NOTE — Progress Notes (Signed)
Quick Note:  Tell patient that x ray shows no acute abnormality. ______ 

## 2011-11-22 NOTE — Progress Notes (Signed)
Quick Note:  Pt aware ______ 

## 2012-01-02 ENCOUNTER — Telehealth: Payer: Self-pay | Admitting: Internal Medicine

## 2012-01-02 NOTE — Telephone Encounter (Signed)
Pt needs a refill on SYNTHROID 75 MCG tablet pt states she needs to come in for labs before it is refilled but not an office visit. Please advise

## 2012-01-04 NOTE — Telephone Encounter (Signed)
Called pt to make aware rx sent to [facility] in Jan. For 1 year.  Pt aware and states she will make a f/u app with labs to have tsh checked.

## 2012-04-29 ENCOUNTER — Ambulatory Visit (INDEPENDENT_AMBULATORY_CARE_PROVIDER_SITE_OTHER): Payer: 59 | Admitting: Family Medicine

## 2012-04-29 ENCOUNTER — Encounter: Payer: Self-pay | Admitting: Family Medicine

## 2012-04-29 ENCOUNTER — Other Ambulatory Visit: Payer: Self-pay | Admitting: Internal Medicine

## 2012-04-29 VITALS — BP 118/72 | Temp 99.1°F | Wt 143.0 lb

## 2012-04-29 DIAGNOSIS — L719 Rosacea, unspecified: Secondary | ICD-10-CM

## 2012-04-29 MED ORDER — METRONIDAZOLE 0.75 % EX GEL: Freq: Every day | CUTANEOUS | Status: DC

## 2012-04-29 NOTE — Progress Notes (Signed)
Chief Complaint  Patient presents with  . Rash    HPI:  Rash: -on face -started a few months ago - started with breaking out on forehead -changed some makeup and used clarifer, but this hasn't helpd -now works around mouth and nose -no pain or itching -washes with sensitive oil of olay, uses primer and foundation -has not gone a week or two without anything -occ looks like pustule -has had mrsa in the passed and tried Bactroban, but that did not help  ROS: See pertinent positives and negatives per HPI.  Past Medical History  Diagnosis Date  . Asthma   . Depression   . Allergic rhinitis   . Hypothyroidism     Family History  Problem Relation Age of Onset  . Arthritis    . Hyperlipidemia    . Hypertension    . Colon cancer    . Heart disease Sister 47    cabg stent    History   Social History  . Marital Status: Legally Separated    Spouse Name: N/A    Number of Children: N/A  . Years of Education: N/A   Social History Main Topics  . Smoking status: Never Smoker   . Smokeless tobacco: Never Used  . Alcohol Use: Yes     once a month  . Drug Use: No  . Sexually Active: None   Other Topics Concern  . 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  NursingDivorcedRegular exercise- yesHH of 2Pets 2 cats 1 dog to move    Current outpatient prescriptions:albuterol (PROVENTIL HFA) 108 (90 BASE) MCG/ACT inhaler, Inhale 2 puffs into the lungs every 6 (six) hours as needed.  , Disp: , Rfl: ;  ASMANEX 60 METERED DOSES 220 MCG/INH inhaler, USE 2 PUFFS BY MOUTH DAILY, Disp: 3 Inhaler, Rfl: 0;  aspirin EC 81 MG tablet, Take 1 tablet (81 mg total) by mouth daily., Disp: , Rfl: ;  co-enzyme Q-10 30 MG capsule, Take 30 mg by mouth 3 (three) times daily., Disp: , Rfl:  escitalopram (LEXAPRO) 20 MG tablet, Take 20 mg by mouth daily.  , Disp: , Rfl: ;  lithium carbonate 300 MG capsule, Take 300 mg by mouth 3 (three) times  daily with meals.  , Disp: , Rfl: ;  MULTIPLE VITAMIN PO, Take by mouth., Disp: , Rfl: ;  SYNTHROID 75 MCG tablet, Take 1 tablet (75 mcg total) by mouth daily., Disp: 90 tablet, Rfl: 3;  Vitamin D, Ergocalciferol, (DRISDOL) 50000 UNITS CAPS, Take 50,000 Units by mouth., Disp: , Rfl:  metroNIDAZOLE (METROGEL) 0.75 % gel, Apply topically daily., Disp: 45 g, Rfl: 1  EXAM:  Filed Vitals:   04/29/12 1101  BP: 118/72  Temp: 99.1 F (37.3 C)    There is no height on file to calculate BMI.  GENERAL: vitals reviewed and listed below, alert, oriented, appears well hydrated and in no acute distress  HEENT: atraumatic, conjucntiva clear, no obvious abnormalities on inspection of external nose and ears  NECK: no masses on inspection  SKIN: several scatterred erythematous papules on chin, central face, forehead   PSYCH: pleasant and cooperative, no obvious depression or anxiety  ASSESSMENT AND PLAN:  Discussed the following assessment and plan:  1. 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    Patient Instructions  Rosacea Rosacea is similar to acne, with red bumps and pimples appearing around the face. The cause is unknown. It is not an infection. It is often made worse by drinking alcohol, especially red wine, or eating hot or spicy foods. Eating chocolate, nuts, or cheese may also make it worse. It can be severe in some cases and eventually result in a bright, red, swollen nose. Rosacea tends to come and go, and cannot usually be completely cured. Sometimes it remains dormant for many years, and then recurs.  Treatment can be very effective, however, in clearing the rash and  preventing the problem. Drug treatment may include topical medicine or an oral antibiotic. Normally 1 to 2 months of treatment is needed to make rosacea go away. Low dose oral antibiotics or topicals may be needed long term for prevention. Call your doctor for a follow up exam in 1 to 2 months, unless your rash worsens and you need more immediate care. Document Released: 08/23/2004 Document Revised: 07/05/2011 Document Reviewed: 06/26/2011 [facility] Patient Information 2012 [place], [place].  -As we discussed, we have prescribed a new medication for you at this appointment. We discussed the common and serious potential adverse effects of this medication and you can review these and more with the pharmacist when you pick up your medication.  Please follow the instructions for use carefully and notify us immediately if you have any problems taking this medication.  -follow up with your doctor in 1 month    Return to clinic immediately if symptoms worsen or persist or new concerns.  No Follow-up on file.  Kriste Basque R.

## 2012-04-29 NOTE — Patient Instructions (Addendum)
Rosacea Rosacea is similar to acne, with red bumps and pimples appearing around the face. The cause is unknown. It is not an infection. It is often made worse by drinking alcohol, especially red wine, or eating hot or spicy foods. Eating chocolate, nuts, or cheese may also make it worse. It can be severe in some cases and eventually result in a bright, red, swollen nose. Rosacea tends to come and go, and cannot usually be completely cured. Sometimes it remains dormant for many years, and then recurs.  Treatment can be very effective, however, in clearing the rash and preventing the problem. Drug treatment may include topical medicine or an oral antibiotic. Normally 1 to 2 months of treatment is needed to make rosacea go away. Low dose oral antibiotics or topicals may be needed long term for prevention. Call your doctor for a follow up exam in 1 to 2 months, unless your rash worsens and you need more immediate care. Document Released: 08/23/2004 Document Revised: 07/05/2011 Document Reviewed: 06/26/2011 [facility] Patient Information 2012 [place], [place].  -As we discussed, we have prescribed a new medication for you at this appointment. We discussed the common and serious potential adverse effects of this medication and you can review these and more with the pharmacist when you pick up your medication.  Please follow the instructions for use carefully and notify us immediately if you have any problems taking this medication.  -follow up with your doctor in 1 month

## 2012-06-02 ENCOUNTER — Encounter: Payer: Self-pay | Admitting: Internal Medicine

## 2012-06-02 ENCOUNTER — Ambulatory Visit (INDEPENDENT_AMBULATORY_CARE_PROVIDER_SITE_OTHER): Payer: 59 | Admitting: Internal Medicine

## 2012-06-02 VITALS — BP 110/70 | HR 70 | Temp 98.5°F | Wt 141.0 lb

## 2012-06-02 DIAGNOSIS — F329 Major depressive disorder, single episode, unspecified: Secondary | ICD-10-CM | POA: Insufficient documentation

## 2012-06-02 DIAGNOSIS — N951 Menopausal and female climacteric states: Secondary | ICD-10-CM

## 2012-06-02 DIAGNOSIS — F0630 Mood disorder due to known physiological condition, unspecified: Secondary | ICD-10-CM | POA: Insufficient documentation

## 2012-06-02 DIAGNOSIS — L718 Other rosacea: Secondary | ICD-10-CM

## 2012-06-02 DIAGNOSIS — L719 Rosacea, unspecified: Secondary | ICD-10-CM

## 2012-06-02 DIAGNOSIS — E039 Hypothyroidism, unspecified: Secondary | ICD-10-CM

## 2012-06-02 DIAGNOSIS — Z5181 Encounter for therapeutic drug level monitoring: Secondary | ICD-10-CM | POA: Insufficient documentation

## 2012-06-02 DIAGNOSIS — E785 Hyperlipidemia, unspecified: Secondary | ICD-10-CM

## 2012-06-02 LAB — COMPREHENSIVE METABOLIC PANEL
ALT: 28 U/L (ref 0–35)
AST: 24 U/L (ref 0–37)
Albumin: 4.3 g/dL (ref 3.5–5.2)
Alkaline Phosphatase: 53 U/L (ref 39–117)
BUN: 16 mg/dL (ref 6–23)
CO2: 29 mEq/L (ref 19–32)
Calcium: 10 mg/dL (ref 8.4–10.5)
Chloride: 104 mEq/L (ref 96–112)
Creatinine, Ser: 0.8 mg/dL (ref 0.4–1.2)
GFR: 78.74 mL/min (ref >60.00)
Glucose, Bld: 87 mg/dL (ref 70–99)
Potassium: 4.2 mEq/L (ref 3.5–5.1)
Sodium: 141 mEq/L (ref 135–145)
Total Bilirubin: 0.6 mg/dL (ref 0.3–1.2)
Total Protein: 7.5 g/dL (ref 6.0–8.3)

## 2012-06-02 LAB — LIPID PANEL
Cholesterol: 268 mg/dL — ABNORMAL HIGH (ref 0–200)
HDL: 51.7 mg/dL (ref >39.00)
Total CHOL/HDL Ratio: 5
Triglycerides: 224 mg/dL — ABNORMAL HIGH (ref 0.0–149.0)
VLDL: 44.8 mg/dL — ABNORMAL HIGH (ref 0.0–40.0)

## 2012-06-02 LAB — CBC WITH DIFFERENTIAL/PLATELET
Basophils Absolute: 0 10*3/uL (ref 0.0–0.1)
Eosinophils Relative: 4.2 % (ref 0.0–5.0)
HCT: 42.6 % (ref 36.0–46.0)
Lymphs Abs: 2.4 10*3/uL (ref 0.7–4.0)
MCHC: 32.7 g/dL (ref 30.0–36.0)
MCV: 91.3 fl (ref 78.0–100.0)
Monocytes Absolute: 0.7 10*3/uL (ref 0.1–1.0)
Platelets: 306 10*3/uL (ref 150.0–400.0)
RDW: 12.3 % (ref 11.5–14.6)

## 2012-06-02 LAB — TSH: TSH: 1.34 u[IU]/mL (ref 0.35–5.50)

## 2012-06-02 LAB — FOLLICLE STIMULATING HORMONE: FSH: 80.5 m[IU]/mL

## 2012-06-02 MED ORDER — DOXYCYCLINE HYCLATE 100 MG PO CAPS: 100.0000 mg | ORAL_CAPSULE | Freq: Two times a day (BID) | ORAL | Status: DC

## 2012-06-02 NOTE — Progress Notes (Signed)
Chief Complaint  Patient presents with  . Follow-up    rosacea and labs.  . Hypothyroidism    HPI: Pt comes in for fu skin issue dx rosacea.Onset in about June lmp  And  Was getting worse.  Has been on  Metrogel for about a month : helped some but still has a number of bumps around the lower face and nl area  . No new topicals no steroid rx etc. No flushing.  LMP 8 months ago ? Menopausal. Has order from psych about getting  Monitoring labs lithium and cpx  And vit d. On lithium  Stable dose  For bipolar depression.  Thyroid no change needs monitoring . caffiene 2 per day  And etoh 3 per month.  No sig flushing  ROS: See pertinent positives and negatives per HPI. amenorrhea  No new resp bleeding issues  Still works nicu and other work .   Past Medical History  Diagnosis Date  . Asthma   . Depression   . Allergic rhinitis   . Hypothyroidism     Family History  Problem Relation Age of Onset  . Arthritis    . Hyperlipidemia    . Hypertension    . Colon cancer    . Heart disease Sister 67    cabg stent    History   Social History  . Marital Status: Legally Separated    Spouse Name: N/A    Number of Children: N/A  . Years of Education: N/A   Social History Main Topics  . Smoking status: Never Smoker   . Smokeless tobacco: Never Used  . Alcohol Use: Yes     Comment: once a month  . Drug Use: No  . Sexually Active: None   Other Topics Concern  . 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  NursingDivorcedRegular exercise- yesHH of 2Pets 2 cats 1 dog to move    Current outpatient prescriptions:albuterol (PROVENTIL HFA) 108 (90 BASE) MCG/ACT inhaler, Inhale 2 puffs into the lungs every 6 (six) hours as needed.  , Disp: , Rfl: ;  ASMANEX 60 METERED DOSES 220 MCG/INH inhaler, USE 2 PUFFS BY MOUTH DAILY, Disp: 3 Inhaler, Rfl: 0;  aspirin EC 81 MG tablet, Take 1 tablet (81 mg total) by mouth daily., Disp: , Rfl: ;   Cholecalciferol (VITAMIN D) 2000 UNITS tablet, Take 2,000 Units by mouth daily., Disp: , Rfl:  co-enzyme Q-10 30 MG capsule, Take 30 mg by mouth 3 (three) times daily., Disp: , Rfl: ;  escitalopram (LEXAPRO) 20 MG tablet, Take 20 mg by mouth daily.  , Disp: , Rfl: ;  lithium carbonate 300 MG capsule, Take 300 mg by mouth 3 (three) times daily with meals.  , Disp: , Rfl: ;  metroNIDAZOLE (METROGEL) 0.75 % gel, Apply topically daily., Disp: 45 g, Rfl: 1;  MULTIPLE VITAMIN PO, Take by mouth., Disp: , Rfl:  SYNTHROID 75 MCG tablet, Take 1 tablet (75 mcg total) by mouth daily., Disp: 90 tablet, Rfl: 3;  doxycycline (VIBRAMYCIN) 100 MG capsule, Take 1 capsule (100 mg total) by mouth 2 (two) times daily. For 2-3 weeks then 1 po qd or as directed, Disp: 60 capsule, Rfl: 3  EXAM: BP 110/70  Pulse 70  Temp 98.5 F (36.9 C) (Oral)  Wt 141 lb (63.957 kg)  SpO2 97%  LMP 03/30/2012  GENERAL: vitals reviewed and listed above, alert, oriented, appears well hydrated  and in no acute distress  HEENT: atraumatic, conjunttiva clear, no obvious abnormalities on inspection of external nose and ears  NECK: no obvious masses on inspection palpation  SKIN:  Tiny papules mid face mostly few on forehead nl area and also chin a bit. No redness otherwise  MS: moves all extremities without noticeable focal  abnormality PSYCH: pleasant and cooperative, no obvious depression or anxiety ASSESSMENT AND PLAN:  Discussed the following assessment and plan: rosacea  1. Acne rosacea, papular type     add doxy for a 2 months or so and fu   2. Medication monitoring encounter  Cholecalciferol (VITAMIN D) 2000 UNITS tablet, Comprehensive metabolic panel, Vitamin D 25 hydroxy, CBC with Differential, TSH, Lipid panel, Lithium level, Follicle Stimulating Hormone  3. HYPOTHYROIDISM  Cholecalciferol (VITAMIN D) 2000 UNITS tablet, Comprehensive metabolic panel, Vitamin D 25 hydroxy, CBC with Differential, TSH, Lipid panel, Lithium level,  Follicle Stimulating Hormone  4. HYPERLIPIDEMIA  Cholecalciferol (VITAMIN D) 2000 UNITS tablet, Comprehensive metabolic panel, Vitamin D 25 hydroxy, CBC with Differential, TSH, Lipid panel, Lithium level, Follicle Stimulating Hormone  5. Depression  Cholecalciferol (VITAMIN D) 2000 UNITS tablet, Comprehensive metabolic panel, Vitamin D 25 hydroxy, CBC with Differential, TSH, Lipid panel, Lithium level, Follicle Stimulating Hormone   bipolar type on meds   6. Perimenopausal    can check fsh to see gyne soon.   -will send copy to her psych of labs  Dx [phone] [phone] and [phone] Saul Fordyce ANP. Fax [phone]  Of note the EHR fax number is incorrect and message sent to manager to have this corrected .   Patient Instructions  Add doxy to your topicals  And after 6- 8 weeks if improved can try off.  Continue the topical metronidazole.  Will notify you  of labs when available.  and send to your specialist for monitoring.   Plan fu depending when due  To get flu vaccine at work.   Lorretta Harp

## 2012-06-02 NOTE — Patient Instructions (Addendum)
Add doxy to your topicals  And after 6- 8 weeks if improved can try off.  Continue the topical metronidazole.  Will notify you  of labs when available.  and send to your specialist for monitoring.

## 2012-06-03 LAB — LDL CHOLESTEROL, DIRECT: Direct LDL: 189.3 mg/dL

## 2012-06-06 ENCOUNTER — Encounter: Payer: Self-pay | Admitting: Internal Medicine

## 2012-06-10 ENCOUNTER — Telehealth: Payer: Self-pay | Admitting: Family Medicine

## 2012-06-10 NOTE — Telephone Encounter (Signed)
Message copied by Nils Flack on Tue Jun 10, 2012  2:45 PM ------      Message from: [facility], [place] [facility]      Created: Fri Jun 06, 2012  4:26 PM      Regarding: fax labs       Please send copy of  All labs to  [employer] [phone]             The fax that was in the EHR is incorrect so I couldn't send it myself.             Thanks       [facility]

## 2012-06-10 NOTE — Telephone Encounter (Signed)
The fax # for Saul Fordyce is incorrect in the chart.  WP wanted you to be aware.

## 2012-06-11 NOTE — Telephone Encounter (Signed)
[facility] ticket opened and fax number corrected in system.

## 2012-06-19 ENCOUNTER — Encounter: Payer: Self-pay | Admitting: Internal Medicine

## 2012-07-08 ENCOUNTER — Ambulatory Visit: Payer: Self-pay | Admitting: Obstetrics and Gynecology

## 2012-07-15 ENCOUNTER — Telehealth: Payer: Self-pay | Admitting: Family Medicine

## 2012-07-15 ENCOUNTER — Encounter: Payer: Self-pay | Admitting: Internal Medicine

## 2012-07-20 NOTE — Telephone Encounter (Signed)
i dont see anything in this message

## 2012-07-24 ENCOUNTER — Other Ambulatory Visit: Payer: Self-pay | Admitting: Internal Medicine

## 2012-07-31 ENCOUNTER — Encounter: Payer: Self-pay | Admitting: Internal Medicine

## 2012-08-21 ENCOUNTER — Encounter: Payer: Self-pay | Admitting: Internal Medicine

## 2012-08-27 ENCOUNTER — Ambulatory Visit: Payer: 59 | Admitting: Obstetrics and Gynecology

## 2012-08-27 ENCOUNTER — Encounter: Payer: Self-pay | Admitting: Obstetrics and Gynecology

## 2012-08-27 ENCOUNTER — Encounter: Payer: Self-pay | Admitting: Internal Medicine

## 2012-08-27 VITALS — BP 112/64 | Temp 98.7°F | Ht 61.75 in | Wt 141.5 lb

## 2012-08-27 DIAGNOSIS — R3915 Urgency of urination: Secondary | ICD-10-CM

## 2012-08-27 DIAGNOSIS — Z01419 Encounter for gynecological examination (general) (routine) without abnormal findings: Secondary | ICD-10-CM

## 2012-08-27 DIAGNOSIS — Z124 Encounter for screening for malignant neoplasm of cervix: Secondary | ICD-10-CM

## 2012-08-27 LAB — POCT URINALYSIS DIPSTICK
Blood, UA: NEGATIVE
Glucose, UA: NEGATIVE
Urobilinogen, UA: NEGATIVE

## 2012-08-27 NOTE — Progress Notes (Signed)
Subjective:    Lisa Lee is a 52 y.o. female, No obstetric history on file., who presents for an annual exam. The patient reports urinary urgency but has had a cough due to respiratory infection.  Has had oligomenorrhea over the past year (up to 5 months without a period-and no molimina). Occasionally has hot flashes and now developed rosacea.  Menstrual cycle:   LMP: No LMP recorded.             Review of Systems Pertinent items are noted in HPI. Denies pelvic pain, urinary tract symptoms, vaginitis symptoms, irregular bleeding, menopausal symptoms, change in bowel habits or rectal bleeding   Objective:    There were no vitals taken for this visit.   Wt Readings from Last 1 Encounters:  06/02/12 141 lb (63.957 kg)   There is no height or weight on file to calculate BMI. General Appearance: Alert, no acute distress HEENT: Grossly normal Neck / Thyroid: Supple, no thyromegaly or cervical adenopathy Lungs: Clear to auscultation bilaterally Back: No CVA tenderness Breast Exam: No masses or nodes.No dimpling, nipple retraction or discharge. Cardiovascular: Regular rate and rhythm.  Gastrointestinal: Soft, non-tender, no masses or organomegaly Pelvic Exam: EGBUS-mild atrophy with atrophic changes on urethra, vagina-mildly atrophic with 2/4 cystocele-no appreciable rectocele, cervix- without lesions or tenderness, uterus appears normal size shape and consistency, adnexae-no masses or tenderness Lymphatic Exam: Non-palpable nodes in neck, clavicular,  axillary, or inguinal regions  Skin: no rashes or abnormalities Extremities: no clubbing cyanosis or edema  Neurologic: grossly normal Psychiatric: Alert and oriented   U/A-negative   Assessment:   Routine GYN Exam Urinary Urgency Due to Cough   Plan:  Continue care for respiratory infection  PAP sent  RTO 1 year or prn  Samai Corea,ELMIRAPA-C

## 2012-08-27 NOTE — Progress Notes (Signed)
Regular Periods: no Mammogram: yes  Monthly Breast Ex.: no Exercise: no  Tetanus < 10 years: no Seatbelts: yes  NI. Bladder Functn.: yes Abuse at home: no  Daily BM's: yes Stressful Work: no  Healthy Diet: yes Sigmoid-Colonoscopy: age 52  Calcium: no Medical problems this year: pt has cold; and c/o urgency    LAST PAP:12/12   Contraception: none  Mammogram:  12/12  PCP: DR. PANOSH  PMH: NO CHANGE  FMH: NO CHANGE  Last Bone Scan: 2013  NL     PT IS DIVORCED,.

## 2012-08-28 LAB — PAP IG W/ RFLX HPV ASCU

## 2012-09-01 ENCOUNTER — Ambulatory Visit: Payer: Self-pay | Admitting: Obstetrics and Gynecology

## 2012-09-03 ENCOUNTER — Encounter: Payer: Self-pay | Admitting: Internal Medicine

## 2012-09-03 ENCOUNTER — Ambulatory Visit (INDEPENDENT_AMBULATORY_CARE_PROVIDER_SITE_OTHER): Payer: BC Managed Care – PPO | Admitting: Internal Medicine

## 2012-09-03 VITALS — BP 128/72 | HR 85 | Temp 98.1°F | Wt 141.0 lb

## 2012-09-03 DIAGNOSIS — R05 Cough: Secondary | ICD-10-CM

## 2012-09-03 DIAGNOSIS — J45909 Unspecified asthma, uncomplicated: Secondary | ICD-10-CM

## 2012-09-03 DIAGNOSIS — Z79899 Other long term (current) drug therapy: Secondary | ICD-10-CM

## 2012-09-03 DIAGNOSIS — R112 Nausea with vomiting, unspecified: Secondary | ICD-10-CM

## 2012-09-03 MED ORDER — ONDANSETRON 4 MG PO TBDP: 4.0000 mg | ORAL_TABLET | Freq: Three times a day (TID) | ORAL | Status: DC | PRN

## 2012-09-03 MED ORDER — PREDNISONE 20 MG PO TABS: ORAL_TABLET | ORAL | Status: DC

## 2012-09-03 NOTE — Progress Notes (Signed)
Chief Complaint  Patient presents with  . Cough    Nausea started this morning.  Has had a "cold" for 2 weeks.  The cough is sometimes productive of a yellow mucus.  . Nausea    HPI: Patient comes in today for SDA for  new problem evaluation. Cough for 2 weeks and now worse started off like a head and chest cold thinks it's her asthma has increased her Asmanex to double dosing recently. Using albuterol inhaler as needed. No unusual fever or shortness of breath otherwise. 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 Thinks prednisone might help. Did take cough medicine Tussionex and hydrocodone to try to keep from coughing during her work situation but hasn't had cough medicine since yesterday.   However today she awoke with nausea and vomited this am .    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 She has not had diarrhea yet no significant abdominal pain. She does have some dry mouth but no UTI symptoms. Denies risk of pregnancy. lmp Jan 15  ROS: See pertinent positives and negatives per HPI. Doing  hoome health  Had quiznos  looking at other jobs. Past Medical History  Diagnosis Date  . Asthma   . Depression   . Allergic rhinitis   . Hypothyroidism   . 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 Hemorrhoids     Family History  Problem Relation Age of Onset  . Heart disease Sister 10    cabg stent  . Arthritis Mother   . Heart disease Mother   . Heart disease Father   . Colon cancer Father   . Pulmonary embolism Daughter   . Heart disease Sister   . Thyroid disease Sister   . Parkinson's disease Father     History   Social History  . Marital Status: Legally Separated    Spouse Name: N/A    Number of Children: N/A  . Years of Education: N/A   Social History Main Topics  . Smoking status: Never Smoker   . Smokeless tobacco: Never Used  . Alcohol Use: Yes   Comment: once a month  . Drug Use: No  . Sexually Active: Yes    Birth Control/ Protection: None   Other Topics Concern  . None   Social History Narrative    And [employer]. Pediatric Nursing in between jobsDivorcedRegular exercise- yesHH of 2Pets 2 cats 1 dog to move    Outpatient Encounter Prescriptions as of 09/03/2012  Medication Sig Dispense Refill  . albuterol (PROVENTIL HFA) 108 (90 BASE) MCG/ACT inhaler Inhale 2 puffs into the lungs every 6 (six) hours as needed.        . ASMANEX 60 METERED DOSES 220 MCG/INH inhaler USE 2 PUFFS BY MOUTH DAILY  3 Inhaler  0  . aspirin EC 81 MG tablet Take 1 tablet (81 mg total) by mouth daily.      . Cholecalciferol (VITAMIN D) 2000 UNITS tablet Take 2,000 Units by mouth daily.      Marland Kitchen co-enzyme Q-10 30 MG capsule Take 30 mg by mouth 3 (three) times daily.      Marland Kitchen escitalopram (LEXAPRO) 20 MG tablet Take 20 mg by mouth daily.        Marland Kitchen lithium carbonate 300 MG capsule Take 300 mg by mouth 3 (three) times daily with meals.        Marland Kitchen  metroNIDAZOLE (METROGEL) 0.75 % gel Apply topically daily.  45 g  1  . MULTIPLE VITAMIN PO Take by mouth.      . SYNTHROID 75 MCG tablet Take 1 tablet (75 mcg total) by mouth daily.  90 tablet  2  . doxycycline (VIBRAMYCIN) 100 MG capsule Take 1 capsule (100 mg total) by mouth 2 (two) times daily. For 2-3 weeks then 1 po qd or as directed  60 capsule  3  . ondansetron (ZOFRAN-ODT) 4 MG disintegrating tablet Take 1 tablet (4 mg total) by mouth every 8 (eight) hours as needed for nausea.  20 tablet  0  . predniSONE (DELTASONE) 20 MG tablet Take 3 po qd for 2 days then 2 po qd for 3 days,or as directed  12 tablet  0    EXAM:  BP 128/72  Pulse 85  Temp 98.1 F (36.7 C) (Oral)  Wt 141 lb (63.957 kg)  SpO2 98%  LMP 08/13/2012  There is no height on file to calculate BMI.  GENERAL: vitals reviewed and listed above, alert, oriented, appears well hydrated and in no acute distress she looks mildly ill nontoxic prefers to lay  down.  HEENT: atraumatic, conjunctiva  clear, no obvious abnormalities on inspection of external nose and ears OP : no lesion edema or exudate OP no acute lesions or edema TMs intact face nontender  NECK: no obvious masses on inspection palpation no adenopathy  LUNGS: clear to auscultation bilaterally, no wheezes, rales or rhonchi, no active wheezing breath sounds are equal  CV: HRRR, no clubbing cyanosis or  peripheral edema nl cap refill  Abdomen soft without organomegaly guarding or rebound bowel sounds are present no focal tenderness or flank pain MS: moves all extremities without noticeable focal  abnormality Skin nose normal capillary refill no acute rashes PSYCH: pleasant and cooperative,  m uncomfortable with her nausea but cognitively intact. Neurologic is grossly nonfocal  ASSESSMENT AND PLAN:  Discussed the following assessment and plan:  1. Nausea & vomiting    Probably viral gastritis on top of her previous situation was exposure history.   2. Cough, persistent    Possibly postinfectious asthmatic can add prednisone after GI illness resolved  3. Lithium use   4. ASTHMA    discuss caution him and she is on lithium and at higher risk of dehydration can use antinausea medicine increase fluids contact us with any alarm symptoms for followup. Otherwise as directed.  Lab from nov given to pt  As her specialist never gotcopy of these. -Patient advised to return or notify health care team  if symptoms worsen or persist or new concerns arise.  Patient Instructions  I agree this is probably  a viral gastritis   As we discussed    .   Increase fluids to avoid dehydration and can use  Anti nausea meds as needed.  Can take pred for cough   your lung exam is ok today.      Neta Mends. Meira Wahba M.D.

## 2012-09-03 NOTE — Patient Instructions (Addendum)
I agree this is probably  a viral gastritis   As we discussed    .   Increase fluids to avoid dehydration and can use  Anti nausea meds as needed.  Can take pred for cough   your lung exam is ok today.

## 2012-09-13 ENCOUNTER — Other Ambulatory Visit: Payer: Self-pay

## 2012-10-08 ENCOUNTER — Encounter: Payer: Self-pay | Admitting: Internal Medicine

## 2012-10-10 NOTE — Telephone Encounter (Signed)
dont think antibiotic would help unless has  Sx of bacterial sinusitis  with prolonged head cold sx and drainage  .  If so then would use  Something like ceftin 500 bid for 7 days   If you dont get allergic sx with cephalosporins .

## 2012-10-16 ENCOUNTER — Encounter: Payer: Self-pay | Admitting: Internal Medicine

## 2012-10-31 ENCOUNTER — Encounter: Payer: Self-pay | Admitting: Internal Medicine

## 2012-10-31 ENCOUNTER — Ambulatory Visit (INDEPENDENT_AMBULATORY_CARE_PROVIDER_SITE_OTHER): Payer: BC Managed Care – PPO | Admitting: Internal Medicine

## 2012-10-31 VITALS — BP 110/70 | HR 73 | Temp 99.9°F | Wt 142.0 lb

## 2012-10-31 DIAGNOSIS — R3915 Urgency of urination: Secondary | ICD-10-CM

## 2012-10-31 DIAGNOSIS — R300 Dysuria: Secondary | ICD-10-CM | POA: Insufficient documentation

## 2012-10-31 DIAGNOSIS — R42 Dizziness and giddiness: Secondary | ICD-10-CM

## 2012-10-31 LAB — POCT URINALYSIS DIPSTICK
Leukocytes, UA: NEGATIVE
Nitrite, UA: NEGATIVE
Protein, UA: NEGATIVE
Urobilinogen, UA: 0.2
pH, UA: 7

## 2012-10-31 MED ORDER — SULFAMETHOXAZOLE-TRIMETHOPRIM 800-160 MG PO TABS: 1.0000 | ORAL_TABLET | Freq: Two times a day (BID) | ORAL | Status: DC

## 2012-10-31 NOTE — Patient Instructions (Addendum)
Your urine looks clear today but could be because you self treated somewhat    Antibiotic  rx to hold onto and can start if  Relapses or not resolved.   Make sure well hydrated and fu if  The lightheadedness if  persistent or progressive .  Bp is 120/68 and 120/60

## 2012-10-31 NOTE — Progress Notes (Signed)
Chief Complaint  Patient presents with  . Dysuria  . Urinary Urgency    HPI: Patient comes in today for SDA for  new problem evaluation. Patient comes in today with one to 2 days of dysuria and burning and an episode of urinary incontinence. With no associated fever or gross hematuria. She had some leftover doxycycline and took 2 of those. Her symptoms are somewhat better but still has a little burning comes in today. Couldn't get into the mini clinic on time.  Also see my chart messages about occasional lightheaded dizziness when she stands up getting out of the car. There is no rapid heart rate true syncope vision changes. She feels like her heartbeat is pounding in her head and it goes away and it is not tinnitus. She had an evaluation for cardiovascular risk last year with the cardiologist for  CV family history. It is possible that she has decreased hydration when these things happen but no specific exercise induced sometimes ROS: See pertinent positives and negatives per HPI.  No cp sob neuro sx or new meds does take lithium no change  Past Medical History  Diagnosis Date  . Asthma   . Depression   . Allergic rhinitis   . Hypothyroidism   . HPV in female 20  . Hemorrhoids     Family History  Problem Relation Age of Onset  . Heart disease Sister 57    cabg stent  . Arthritis Mother   . Heart disease Mother   . Heart disease Father   . Colon cancer Father   . Pulmonary embolism Daughter   . Heart disease Sister   . Thyroid disease Sister   . Parkinson's disease Father     History   Social History  . Marital Status: Legally Separated    Spouse Name: N/A    Number of Children: N/A  . Years of Education: N/A   Social History Main Topics  . Smoking status: Never Smoker   . Smokeless tobacco: Never Used  . Alcohol Use: Yes     Comment: once a month  . Drug Use: No  . Sexually Active: Yes    Birth Control/ Protection: None   Other Topics Concern  . None    Social History Narrative    [employer]. Pediatric Nursing in between jobs   Divorced   Regular exercise- yes   HH of 2   Pets 2 cats 1 dog to move    Outpatient Encounter Prescriptions as of 10/31/2012  Medication Sig Dispense Refill  . albuterol (PROVENTIL HFA) 108 (90 BASE) MCG/ACT inhaler Inhale 2 puffs into the lungs every 6 (six) hours as needed.        . ASMANEX 60 METERED DOSES 220 MCG/INH inhaler USE 2 PUFFS BY MOUTH DAILY  3 Inhaler  0  . aspirin EC 81 MG tablet Take 1 tablet (81 mg total) by mouth daily.      . Cholecalciferol (VITAMIN D) 2000 UNITS tablet Take 2,000 Units by mouth daily.      Marland Kitchen co-enzyme Q-10 30 MG capsule Take 30 mg by mouth 3 (three) times daily.      Marland Kitchen doxycycline (VIBRAMYCIN) 100 MG capsule Take 1 capsule (100 mg total) by mouth 2 (two) times daily. For 2-3 weeks then 1 po qd or as directed  60 capsule  3  . escitalopram (LEXAPRO) 20 MG tablet Take 20 mg by mouth daily.        Marland Kitchen lithium carbonate 300  MG capsule Take 300 mg by mouth 3 (three) times daily with meals.        . metroNIDAZOLE (METROGEL) 0.75 % gel Apply topically daily.  45 g  1  . MULTIPLE VITAMIN PO Take by mouth.      . SYNTHROID 75 MCG tablet Take 1 tablet (75 mcg total) by mouth daily.  90 tablet  2  . sulfamethoxazole-trimethoprim (SEPTRA DS) 800-160 MG per tablet Take 1 tablet by mouth 2 (two) times daily.  6 tablet  0  . [DISCONTINUED] ondansetron (ZOFRAN-ODT) 4 MG disintegrating tablet Take 1 tablet (4 mg total) by mouth every 8 (eight) hours as needed for nausea.  20 tablet  0  . [DISCONTINUED] predniSONE (DELTASONE) 20 MG tablet Take 3 po qd for 2 days then 2 po qd for 3 days,or as directed  12 tablet  0   No facility-administered encounter medications on file as of 10/31/2012.    EXAM:  BP 110/70  Pulse 73  Temp(Src) 99.9 F (37.7 C) (Oral)  Wt 142 lb (64.411 kg)  BMI 26.2 kg/m2  SpO2 98%  LMP 10/22/2012  Body mass index is 26.2 kg/(m^2).  GENERAL: vitals reviewed and  listed above, alert, oriented, appears well hydrated and in no acute distress  HEENT: atraumatic, conjunctiva  clear, no obvious abnormalities on inspection of external nose and ears  NECK: no obvious masses on inspection palpation   LUNGS: clear to auscultation bilaterally, no wheezes, rales or rhonchi, good air movement  CV: HRRR, no clubbing cyanosis or  peripheral edema nl cap refill  orthostatic blood pressure readings 120/68 sitting standing 120/60 pulse ranged in the 60s sitting about 70s standing. Abdomen:  Sof,t normal bowel sounds without hepatosplenomegaly, no guarding rebound or masses no CVA tenderness MS: moves all extremities without noticeable focal  abnormality  PSYCH: pleasant and cooperative, no obvious depression or anxiety  ASSESSMENT AND PLAN:  Discussed the following assessment and plan:  Dysuria - Possibly partly treated UTI urine is clear today prescription given to hold if recurrent symptoms can retreat as UTI - Plan: POC Urinalysis Dipstick  Urinary urgency - Plan: POC Urinalysis Dipstick  Positional lightheadedness - remot hs of faint when pregnant  ortho st bp are normal today avoid dehydration with lithim use etc and fu if progressive and not controlled labs 11 nl Followup with any of these if persistent or progressive. -Patient advised to return or notify health care team  if symptoms worsen or persist or new concerns arise.  Patient Instructions  Your urine looks clear today but could be because you self treated somewhat    Antibiotic  rx to hold onto and can start if  Relapses or not resolved.   Make sure well hydrated and fu if  The lightheadedness if  persistent or progressive .  Bp is 120/68 and 120/60     Burna Mortimer K. Samyia Motter M.D.

## 2012-11-17 ENCOUNTER — Telehealth: Payer: Self-pay

## 2012-11-17 NOTE — Telephone Encounter (Signed)
Rx request for metronidozole top 0.75% .  Pt saw Dr. Selena Batten on 04/29/2012.  Per Dr. Selena Batten send to you to see if you can refill. Pls advise.

## 2012-11-18 ENCOUNTER — Other Ambulatory Visit: Payer: Self-pay | Admitting: Family Medicine

## 2012-11-18 DIAGNOSIS — L719 Rosacea, unspecified: Secondary | ICD-10-CM

## 2012-11-18 MED ORDER — METRONIDAZOLE 0.75 % EX GEL: Freq: Every day | CUTANEOUS | Status: DC

## 2012-11-18 NOTE — Telephone Encounter (Signed)
Please ok to refill x 5

## 2012-11-18 NOTE — Telephone Encounter (Signed)
Sent by e-scribe. 

## 2012-11-21 ENCOUNTER — Ambulatory Visit (INDEPENDENT_AMBULATORY_CARE_PROVIDER_SITE_OTHER): Payer: BC Managed Care – PPO | Admitting: Internal Medicine

## 2012-11-21 ENCOUNTER — Encounter: Payer: Self-pay | Admitting: Internal Medicine

## 2012-11-21 VITALS — BP 106/70 | HR 74 | Temp 98.1°F | Wt 139.0 lb

## 2012-11-21 DIAGNOSIS — Z79899 Other long term (current) drug therapy: Secondary | ICD-10-CM

## 2012-11-21 DIAGNOSIS — F32A Depression, unspecified: Secondary | ICD-10-CM

## 2012-11-21 DIAGNOSIS — F3289 Other specified depressive episodes: Secondary | ICD-10-CM

## 2012-11-21 DIAGNOSIS — M79609 Pain in unspecified limb: Secondary | ICD-10-CM

## 2012-11-21 DIAGNOSIS — M79644 Pain in right finger(s): Secondary | ICD-10-CM

## 2012-11-21 DIAGNOSIS — Z8659 Personal history of other mental and behavioral disorders: Secondary | ICD-10-CM

## 2012-11-21 DIAGNOSIS — F329 Major depressive disorder, single episode, unspecified: Secondary | ICD-10-CM

## 2012-11-21 DIAGNOSIS — E039 Hypothyroidism, unspecified: Secondary | ICD-10-CM

## 2012-11-21 DIAGNOSIS — M79646 Pain in unspecified finger(s): Secondary | ICD-10-CM | POA: Insufficient documentation

## 2012-11-21 MED ORDER — ESCITALOPRAM OXALATE 20 MG PO TABS: 20.0000 mg | ORAL_TABLET | Freq: Every day | ORAL | Status: DC

## 2012-11-21 NOTE — Progress Notes (Signed)
Chief Complaint  Patient presents with  . Follow-up    Meds.  Would like for you to take over Lexapro and Lithium.  Cannot get refills through psych.    HPI: Patient comes in today for f above consideration  [facility] been seen   [facility] and has been on Lexapro and lithium for quite a while. She is usually seen every 6 months. However most recently she has had And problems getting med refilled.   Multiple  [facility] phone calls and eventually .  Called Kelly Virgil's office 3 times and still doesn't have a refill on her Lexapro and is beginning to feel bad. Made an appointment here to see what we could do to help her consideration of taking of her medications.  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    ROS: See pertinent positives and negatives per HPI. 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 She is right-handed  Past Medical History  Diagnosis Date  . Asthma   . Depression   . Allergic rhinitis   . Hypothyroidism   . HPV in female 83  . Hemorrhoids   . Diarrhea 08/09/2011    Poss ibs  Vs other  Related to stress  consdier other eval if needed. Had colonoscoopy per dr Matthias Hughs   . Medication side effect 11/01/2011    tussionex   distrubed sleep     Family History  Problem Relation Age of Onset  . Heart disease Sister 77    cabg stent  . Arthritis Mother   . Heart disease Mother   . Heart disease Father   . Colon cancer Father   . Pulmonary embolism Daughter   . Heart disease Sister   . Thyroid disease Sister   . Parkinson's disease Father     History   Social History  . Marital Status:  Legally Separated    Spouse Name: N/A    Number of Children: N/A  . Years of Education: N/A   Social History Main Topics  . Smoking status: Never Smoker   . Smokeless tobacco: Never Used  . Alcohol Use: Yes     Comment: once a month  . Drug Use: No  . Sexually Active: Yes    Birth Control/ Protection: None   Other Topics Concern  . 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 albuterol (PROVENTIL HFA) 108 (90 BASE) MCG/ACT inhaler Inhale 2 puffs into the lungs every 6 (six) hours as needed.        . ASMANEX 60 METERED DOSES 220 MCG/INH inhaler USE 2 PUFFS BY MOUTH DAILY  3 Inhaler  0  . aspirin EC 81 MG tablet Take 1 tablet (81 mg total) by mouth daily.      . Cholecalciferol (VITAMIN D) 2000 UNITS tablet Take 2,000 Units by mouth daily.      Marland Kitchen  co-enzyme Q-10 30 MG capsule Take 30 mg by mouth 3 (three) times daily.      Marland Kitchen lithium carbonate 300 MG capsule Take 300 mg by mouth 3 (three) times daily with meals.        . metroNIDAZOLE (METROGEL) 0.75 % gel Apply topically daily.  45 g  5  . MULTIPLE VITAMIN PO Take by mouth.      . SYNTHROID 75 MCG tablet Take 1 tablet (75 mcg total) by mouth daily.  90 tablet  2  . doxycycline (VIBRAMYCIN) 100 MG capsule Take 1 capsule (100 mg total) by mouth 2 (two) times daily. For 2-3 weeks then 1 po qd or as directed  60 capsule  3  . escitalopram (LEXAPRO) 20 MG tablet Take 1 tablet (20 mg total) by mouth daily.  30 tablet  2  . [DISCONTINUED] escitalopram (LEXAPRO) 20 MG tablet Take 20 mg by mouth daily.        . [DISCONTINUED] sulfamethoxazole-trimethoprim (SEPTRA DS) 800-160 MG per tablet Take 1 tablet by mouth 2 (two) times daily.  6 tablet  0   No facility-administered encounter medications on file as of 11/21/2012.    EXAM:  BP 106/70   Pulse 74  Temp(Src) 98.1 F (36.7 C) (Oral)  Wt 139 lb (63.05 kg)  BMI 25.64 kg/m2  SpO2 98%  LMP 10/22/2012  Body mass index is 25.64 kg/(m^2).  GENERAL: vitals reviewed and listed above, alert, oriented, appears well hydrated and in no acute distress  HEENT: atraumatic, conjunctiva  clear, no obvious abnormalities on inspection of external nose and ears MS: moves all extremities without noticeable focal  abnormality right hand wrist without redness or warmth tenderness series at the base of the thumb near the wrist no atrophy is noted good range of motion neurovascular is grossly intact  PSYCH: pleasant and cooperative, no obvious depression or anxiety  ASSESSMENT AND PLAN:  Discussed the following assessment and plan:  Depression - on rx for years doing well until ran out.  Medication management  History of depression - Under treatment  Thumb pain, right - Probably overuse could result from arthritis no alarm findings conservative treatment care with NSAIDs because she is on lithium  Unspecified hypothyroidism  Lithium use Discussed above prefers specialty care at times because of the management of lithium resistant depression although she is quite stable at this time we'll refill medications to ensure appropriate therapeutic levels.   Consideration of other behavioral health office visit she doesn't feel comfortable going back because of what happened however the meantime we can check a lithium and TSH levels due in May. Reviewed her labs from November triglycerides were elevated should pay attention to lifestyle interventions otherwise  -Patient advised to return or notify health care team  if symptoms worsen or persist or new concerns arise.  Patient Instructions  Prefer get in with specialist for the lithium management but can  Refill if needed.     Neta Mends.Isley Weisheit M.D.

## 2012-11-21 NOTE — Patient Instructions (Signed)
Prefer get in with specialist for the lithium management but can  Refill if needed.

## 2012-11-28 ENCOUNTER — Other Ambulatory Visit: Payer: Self-pay | Admitting: *Deleted

## 2012-11-28 ENCOUNTER — Ambulatory Visit (INDEPENDENT_AMBULATORY_CARE_PROVIDER_SITE_OTHER): Payer: BC Managed Care – PPO | Admitting: Nurse Practitioner

## 2012-11-28 ENCOUNTER — Encounter: Payer: Self-pay | Admitting: Nurse Practitioner

## 2012-11-28 VITALS — BP 110/60 | HR 75 | Temp 98.3°F | Ht 61.5 in | Wt 138.2 lb

## 2012-11-28 DIAGNOSIS — R300 Dysuria: Secondary | ICD-10-CM

## 2012-11-28 DIAGNOSIS — N390 Urinary tract infection, site not specified: Secondary | ICD-10-CM

## 2012-11-28 LAB — POCT URINALYSIS DIPSTICK
Nitrite, UA: NEGATIVE
Urobilinogen, UA: 0.2
pH, UA: 7

## 2012-11-28 MED ORDER — PHENAZOPYRIDINE HCL 200 MG PO TABS: 200.0000 mg | ORAL_TABLET | Freq: Three times a day (TID) | ORAL | Status: DC | PRN

## 2012-11-28 MED ORDER — NITROFURANTOIN MONOHYD MACRO 100 MG PO CAPS: 100.0000 mg | ORAL_CAPSULE | Freq: Two times a day (BID) | ORAL | Status: DC

## 2012-11-28 NOTE — Progress Notes (Signed)
Patient ID: Lisa Lee, female   DOB: 1961/06/20, 52 y.o.   MRN: [id number] Subjective:    Lisa Lee is a 52 y.o. female who complains of burning with urination, hematuria, inability to void and passed blood clots this am. for a few days.  Patient also complains of suprapubic pain. Patient denies back pain, cough, fever, headache and vaginal discharge.  Patient does not have a history of recurrent UTI.  Patient does not have a history of pyelonephritis. She was treated for UTI a few weeks ago with bactrim with complete relief of symptoms. States has had increased sexual activity recently. The following portions of the patient's history were reviewed and updated as appropriate: allergies, current medications, past medical history, past social history, past surgical history and problem list. Review of Systems Pertinent items are noted in HPI.    Objective:    BP 110/60  Pulse 75  Temp(Src) 98.3 F (36.8 C) (Oral)  Ht 5' 1.5" (1.562 m)  Wt 138 lb 4 oz (62.71 kg)  BMI 25.7 kg/m2  SpO2 96%  LMP 11/11/2012 General: alert, cooperative, appears stated age and no distress  Abdomen: soft, nondistended, tenderness mild and suprapubic tenderness to palpation ., without guarding, without rebound and no masses palpated suprapubic  Back: CVA tenderness absent  GU: defer exam   Laboratory:  Urine dipstick shows 4+ for hemoglobin, negative for ketones, 4+ for leukocyte esterase, negative for nitrites and trace for protein.   Micro exam: not done.    Assessment:    UTI    Plan: Plan:    1. Medications: nitrofurantoin 2. Maintain adequate hydration 3. Follow up if symptoms not improving, and prn.

## 2012-11-28 NOTE — Progress Notes (Signed)
Per pt request, resent Rx to [facility] [address]

## 2012-11-28 NOTE — Addendum Note (Signed)
Addended by: Alben Spittle, Wess Baney [facility] on: 11/28/2012 01:00 PM   Modules accepted: Orders

## 2012-11-28 NOTE — Patient Instructions (Addendum)
Urinary Tract Infection Urinary tract infections (UTIs) can develop anywhere along your urinary tract. Your urinary tract is your body's drainage system for removing wastes and extra water. Your urinary tract includes two kidneys, two ureters, a bladder, and a urethra. Your kidneys are a pair of bean-shaped organs. Each kidney is about the size of your fist. They are located below your ribs, one on each side of your spine. CAUSES Infections are caused by microbes, which are microscopic organisms, including fungi, viruses, and bacteria. These organisms are so small that they can only be seen through a microscope. Bacteria are the microbes that most commonly cause UTIs. SYMPTOMS  Symptoms of UTIs may vary by age and gender of the patient and by the location of the infection. Symptoms in young women typically include a frequent and intense urge to urinate and a painful, burning feeling in the bladder or urethra during urination. Older women and men are more likely to be tired, shaky, and weak and have muscle aches and abdominal pain. A fever may mean the infection is in your kidneys. Other symptoms of a kidney infection include pain in your back or sides below the ribs, nausea, and vomiting. DIAGNOSIS To diagnose a UTI, your caregiver will ask you about your symptoms. Your caregiver also will ask to provide a urine sample. The urine sample will be tested for bacteria and white blood cells. White blood cells are made by your body to help fight infection. TREATMENT  Typically, UTIs can be treated with medication. Because most UTIs are caused by a bacterial infection, they usually can be treated with the use of antibiotics. The choice of antibiotic and length of treatment depend on your symptoms and the type of bacteria causing your infection. HOME CARE INSTRUCTIONS  If you were prescribed antibiotics, take them exactly as your caregiver instructs you. Finish the medication even if you feel better after you  have only taken some of the medication.  Drink enough water and fluids to keep your urine clear or pale yellow.  Avoid caffeine, tea, and carbonated beverages. They tend to irritate your bladder.  Empty your bladder often. Avoid holding urine for long periods of time.  Empty your bladder before and after sexual intercourse.  After a bowel movement, women should cleanse from front to back. Use each tissue only once. SEEK MEDICAL CARE IF:   You have back pain.  You develop a fever.  Your symptoms do not begin to resolve within 3 days. SEEK IMMEDIATE MEDICAL CARE IF:   You have severe back pain or lower abdominal pain.  You develop chills.  You have nausea or vomiting.  You have continued burning or discomfort with urination. MAKE SURE YOU:   Understand these instructions.  Will watch your condition.  Will get help right away if you are not doing well or get worse. Document Released: 04/25/2005 Document Revised: 01/15/2012 Document Reviewed: 08/24/2011 [facility] Patient Information 2013 [place], [place].  Increase water/juice intake to 8 -10 8 oz. Daily for several days. Take antibiotic as prescribed. 500 - 1000 mg Vit C twice daily will help to create a bacteriostatic environment. Cranberry juice/tabs help as well. Urinate after sexual activity. Call Dr. Fabian Sharp if no improvement by Monday. Feel better!

## 2012-12-01 ENCOUNTER — Encounter: Payer: Self-pay | Admitting: Internal Medicine

## 2012-12-01 DIAGNOSIS — R310 Gross hematuria: Secondary | ICD-10-CM

## 2012-12-08 ENCOUNTER — Encounter: Payer: Self-pay | Admitting: Internal Medicine

## 2012-12-09 ENCOUNTER — Other Ambulatory Visit (INDEPENDENT_AMBULATORY_CARE_PROVIDER_SITE_OTHER): Payer: BC Managed Care – PPO

## 2012-12-09 DIAGNOSIS — R310 Gross hematuria: Secondary | ICD-10-CM

## 2012-12-09 LAB — URINALYSIS, ROUTINE W REFLEX MICROSCOPIC
Ketones, ur: NEGATIVE
Leukocytes, UA: NEGATIVE
Specific Gravity, Urine: 1.025 (ref 1.000–1.030)
Total Protein, Urine: NEGATIVE
pH: 6 (ref 5.0–8.0)

## 2012-12-10 ENCOUNTER — Other Ambulatory Visit: Payer: Self-pay | Admitting: Internal Medicine

## 2012-12-10 ENCOUNTER — Encounter: Payer: Self-pay | Admitting: Internal Medicine

## 2012-12-10 MED ORDER — SULFAMETHOXAZOLE-TRIMETHOPRIM 800-160 MG PO TABS: ORAL_TABLET | ORAL | Status: DC

## 2012-12-10 MED ORDER — LITHIUM CARBONATE 300 MG PO CAPS: 300.0000 mg | ORAL_CAPSULE | Freq: Three times a day (TID) | ORAL | Status: DC

## 2012-12-10 NOTE — Progress Notes (Signed)
See message note  Response  Order uti IC prophylaxis and lithium rx for just one month  Until gets in to psych office

## 2012-12-12 MED ORDER — LITHIUM CARBONATE ER 300 MG PO TBCR: 300.0000 mg | EXTENDED_RELEASE_TABLET | Freq: Two times a day (BID) | ORAL | Status: DC

## 2012-12-12 NOTE — Telephone Encounter (Signed)
Sent in extended release lithium but cr instead of er.   The only one i found  find in the EHR .

## 2012-12-23 ENCOUNTER — Other Ambulatory Visit (INDEPENDENT_AMBULATORY_CARE_PROVIDER_SITE_OTHER): Payer: BC Managed Care – PPO

## 2012-12-23 DIAGNOSIS — E039 Hypothyroidism, unspecified: Secondary | ICD-10-CM

## 2012-12-23 DIAGNOSIS — Z79899 Other long term (current) drug therapy: Secondary | ICD-10-CM

## 2012-12-30 NOTE — Progress Notes (Signed)
Quick Note:  Patient reviewed on 12/28/12 ______

## 2013-01-13 ENCOUNTER — Other Ambulatory Visit: Payer: Self-pay | Admitting: Internal Medicine

## 2013-02-11 ENCOUNTER — Other Ambulatory Visit: Payer: Self-pay | Admitting: Family Medicine

## 2013-02-11 ENCOUNTER — Telehealth: Payer: Self-pay | Admitting: Family Medicine

## 2013-02-11 MED ORDER — ESCITALOPRAM OXALATE 20 MG PO TABS: 20.0000 mg | ORAL_TABLET | Freq: Every day | ORAL | Status: DC

## 2013-02-11 MED ORDER — LITHIUM CARBONATE ER 300 MG PO TBCR: EXTENDED_RELEASE_TABLET | ORAL | Status: DC

## 2013-02-11 NOTE — Telephone Encounter (Signed)
Ok to refill x 1  

## 2013-02-11 NOTE — Telephone Encounter (Signed)
Last seen on 11/21/12.  She should be returning in Jan.  No future appt has been made.  I called and spoke to the pt.  She has an appt with psych on 02/18/13.  She is requesting one more refill. Please advise.  Thanks!!  [facility].

## 2013-02-11 NOTE — Telephone Encounter (Signed)
Patient notified to pick up at the pharmacy. 

## 2013-04-10 ENCOUNTER — Other Ambulatory Visit: Payer: Self-pay | Admitting: Family Medicine

## 2013-04-10 ENCOUNTER — Telehealth: Payer: Self-pay | Admitting: Family Medicine

## 2013-04-10 NOTE — Telephone Encounter (Signed)
Patient's insurance now requires her to use [facility].  She needs refills on Asmanex, Albuterol and Synthroid.  I do not see that the patient has been seen for Asmanex and albuterol.  Do not see where you are prescribing the albuterol.  Please advise.  Thanks!!

## 2013-04-13 NOTE — Telephone Encounter (Signed)
We can do management of the inhalers  asthmanex  X 6   pro air or proventil  Disp 1 refill x 2

## 2013-04-14 ENCOUNTER — Other Ambulatory Visit: Payer: Self-pay | Admitting: Family Medicine

## 2013-04-14 MED ORDER — SYNTHROID 75 MCG PO TABS: 75.0000 ug | ORAL_TABLET | Freq: Every day | ORAL | Status: DC

## 2013-04-14 MED ORDER — ALBUTEROL SULFATE HFA 108 (90 BASE) MCG/ACT IN AERS: 2.0000 | INHALATION_SPRAY | Freq: Four times a day (QID) | RESPIRATORY_TRACT | Status: DC | PRN

## 2013-04-14 MED ORDER — MOMETASONE FUROATE 220 MCG/INH IN AEPB: INHALATION_SPRAY | RESPIRATORY_TRACT | Status: DC

## 2013-04-14 NOTE — Telephone Encounter (Signed)
Sent to [facility] by e-scribe. 

## 2013-04-15 ENCOUNTER — Encounter: Payer: Self-pay | Admitting: Internal Medicine

## 2013-04-15 LAB — HM MAMMOGRAPHY

## 2013-04-17 ENCOUNTER — Encounter: Payer: Self-pay | Admitting: Internal Medicine

## 2013-04-21 ENCOUNTER — Other Ambulatory Visit: Payer: Self-pay | Admitting: Family Medicine

## 2013-04-21 DIAGNOSIS — Z8249 Family history of ischemic heart disease and other diseases of the circulatory system: Secondary | ICD-10-CM

## 2013-04-21 MED ORDER — ALBUTEROL SULFATE HFA 108 (90 BASE) MCG/ACT IN AERS: 2.0000 | INHALATION_SPRAY | Freq: Four times a day (QID) | RESPIRATORY_TRACT | Status: DC | PRN

## 2013-04-29 ENCOUNTER — Ambulatory Visit (INDEPENDENT_AMBULATORY_CARE_PROVIDER_SITE_OTHER): Payer: BC Managed Care – PPO | Admitting: Internal Medicine

## 2013-04-29 ENCOUNTER — Encounter: Payer: Self-pay | Admitting: Internal Medicine

## 2013-04-29 VITALS — BP 110/60 | HR 73 | Temp 97.9°F | Resp 20 | Wt 137.0 lb

## 2013-04-29 DIAGNOSIS — G5600 Carpal tunnel syndrome, unspecified upper limb: Secondary | ICD-10-CM

## 2013-04-29 DIAGNOSIS — Z23 Encounter for immunization: Secondary | ICD-10-CM

## 2013-04-29 MED ORDER — SULFAMETHOXAZOLE-TRIMETHOPRIM 800-160 MG PO TABS: ORAL_TABLET | ORAL | Status: DC

## 2013-04-29 NOTE — Progress Notes (Signed)
Subjective:    Patient ID: Lisa Lee, female    DOB: June 03, 1961, 52 y.o.   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 This is described as a numbness and pins and needle sensation. It is bilateral. This has been present intermittently since the summer. She has been using nocturnal wrist splints with unclear benefit. She also complains of discomfort small joints of the hands as well as shoulders knees and feet. 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 She is concerned about arthritis since there is a family history Past Medical History  Diagnosis Date  . Asthma   . Depression   . Allergic rhinitis   . Hypothyroidism   . 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 Hemorrhoids   . Diarrhea 08/09/2011    Poss ibs  Vs other  Related to stress  consdier other eval if needed. Had colonoscoopy per dr Matthias Hughs   . Medication side effect 11/01/2011    tussionex   distrubed sleep     History   Social History  . Marital Status: Legally Separated    Spouse Name: N/A    Number of Children: N/A  . Years of Education: N/A   Occupational History  . Not on file.   Social History Main Topics  . Smoking status: Never Smoker   . Smokeless tobacco: Never Used  . Alcohol Use: Yes     Comment: once a month  . Drug Use: No  . Sexual Activity: Yes    Birth Control/ Protection: None   Other Topics Concern  . Not on file   Social History Narrative    [employer]. Pediatric Nursing in between jobs   Divorced   Regular exercise- yes   HH of 2   Pets 2 cats 1 dog to move    Past Surgical History  Procedure Laterality Date  . Hysteroscopy      uterine polypectomy Jan 7th  . Tonsillectomy    . Cervical cryotherapy  1984  . Lymph node biopsy    . Dilation and curettage, diagnostic / therapeutic  1994    Blighted Ovum  . Hysteroscopy with resectoscope  08/04/2009    Removed polyp &  IUD    Family History  Problem Relation Age of Onset  . Heart disease Sister 59    cabg stent  . Arthritis Mother   . Heart disease Mother   . Heart disease Father   . Colon cancer Father   . Pulmonary embolism Daughter   . Heart disease Sister   . Thyroid disease Sister   . Parkinson's disease Father     Allergies  Allergen Reactions  . Penicillins     Current Outpatient Prescriptions on File Prior to Visit  Medication Sig Dispense Refill  . albuterol (PROAIR HFA) 108 (90 BASE) MCG/ACT inhaler Inhale 2 puffs into the lungs every 6 (six) hours as needed for wheezing.  3 Inhaler  0  . aspirin EC 81 MG tablet Take 1 tablet (81 mg total) by mouth daily.      . Cholecalciferol (VITAMIN D) 2000 UNITS tablet Take 2,000 Units by mouth daily.      Marland Kitchen co-enzyme Q-10 30 MG capsule Take 30 mg by mouth 3 (three) times daily.      Marland Kitchen escitalopram (LEXAPRO) 20 MG tablet Take 1 tablet (20 mg total) by  mouth daily.  30 tablet  5  . lithium carbonate (LITHOBID) 300 MG CR tablet TAKE ONE TABLET BY MOUTH TWICE DAILY  60 tablet  0  . metroNIDAZOLE (METROGEL) 0.75 % gel Apply topically daily.  45 g  5  . mometasone (ASMANEX 60 METERED DOSES) 220 MCG/INH inhaler USE 2 PUFFS BY MOUTH DAILY  3 Inhaler  1  . MULTIPLE VITAMIN PO Take by mouth.      . phenazopyridine (PYRIDIUM) 200 MG tablet Take 1 tablet (200 mg total) by mouth 3 (three) times daily as needed for pain.  6 tablet  0  . SYNTHROID 75 MCG tablet Take 1 tablet (75 mcg total) by mouth daily.  90 tablet  1   No current facility-administered medications on file prior to visit.    BP 110/60  Pulse 73  Temp(Src) 97.9 F (36.6 C) (Oral)  Resp 20  Wt 137 lb (62.143 kg)  BMI 25.47 kg/m2  SpO2 98%        Review of Systems  Musculoskeletal: Positive for arthralgias.  Neurological: Positive for numbness.       Objective:   Physical Exam  Constitutional: She appears well-developed and well-nourished. No distress.  Musculoskeletal:   Positive Phalen's test Negative Tinel's Negative for carpal compression test  No significant osteoarthritic changes involving the hands          Assessment & Plan:   Probable mild carpal tunnel syndrome. Continue with the wrist splints. She does benefit from naproxen Arthralgias. Probable more of the overuse soft tissue problem;  doubt significant arthritic condition. We'll continue when necessary naproxen and observe

## 2013-04-29 NOTE — Patient Instructions (Signed)
Naprosyn as directed  Call or return to clinic prn if these symptoms worsen or fail to improve as anticipated.

## 2013-08-26 ENCOUNTER — Other Ambulatory Visit: Payer: Self-pay | Admitting: Obstetrics and Gynecology

## 2013-08-26 HISTORY — PX: COLPOSCOPY: SHX161

## 2013-09-08 ENCOUNTER — Encounter: Payer: Self-pay | Admitting: Internal Medicine

## 2013-09-10 NOTE — Telephone Encounter (Signed)
Looks like she is due. Have her sced  CPX with full labs   Can work her  in March  Or when Te[employer] Corporationconveneient

## 2013-09-21 ENCOUNTER — Other Ambulatory Visit: Payer: Self-pay | Admitting: Internal Medicine

## 2013-09-30 ENCOUNTER — Other Ambulatory Visit (INDEPENDENT_AMBULATORY_CARE_PROVIDER_SITE_OTHER): Payer: BC Managed Care – PPO

## 2013-09-30 DIAGNOSIS — Z0000 Encounter for general adult medical examination without abnormal findings: Secondary | ICD-10-CM

## 2013-09-30 LAB — CBC WITH DIFFERENTIAL/PLATELET
BASOS PCT: 0.4 % (ref 0.0–3.0)
Basophils Absolute: 0 10*3/uL (ref 0.0–0.1)
EOS ABS: 0.4 10*3/uL (ref 0.0–0.7)
Eosinophils Relative: 4.5 % (ref 0.0–5.0)
HEMATOCRIT: 41.2 % (ref 36.0–46.0)
HEMOGLOBIN: 13.4 g/dL (ref 12.0–15.0)
LYMPHS PCT: 28.2 % (ref 12.0–46.0)
Lymphs Abs: 2.2 10*3/uL (ref 0.7–4.0)
MCHC: 32.5 g/dL (ref 30.0–36.0)
MCV: 92.2 fl (ref 78.0–100.0)
MONO ABS: 0.6 10*3/uL (ref 0.1–1.0)
Monocytes Relative: 7.3 % (ref 3.0–12.0)
NEUTROS ABS: 4.7 10*3/uL (ref 1.4–7.7)
Neutrophils Relative %: 59.6 % (ref 43.0–77.0)
Platelets: 274 10*3/uL (ref 150.0–400.0)
RBC: 4.47 Mil/uL (ref 3.87–5.11)
RDW: 13 % (ref 11.5–14.6)
WBC: 7.9 10*3/uL (ref 4.5–10.5)

## 2013-09-30 LAB — BASIC METABOLIC PANEL
BUN: 15 mg/dL (ref 6–23)
CO2: 25 mEq/L (ref 19–32)
CREATININE: 0.8 mg/dL (ref 0.4–1.2)
Calcium: 9.8 mg/dL (ref 8.4–10.5)
Chloride: 108 mEq/L (ref 96–112)
GFR: 78.34 mL/min (ref >60.00)
Glucose, Bld: 77 mg/dL (ref 70–99)
POTASSIUM: 4.9 meq/L (ref 3.5–5.1)
Sodium: 140 mEq/L (ref 135–145)

## 2013-09-30 LAB — HEPATIC FUNCTION PANEL
ALT: 27 U/L (ref 0–35)
AST: 21 U/L (ref 0–37)
Albumin: 4.1 g/dL (ref 3.5–5.2)
Alkaline Phosphatase: 53 U/L (ref 39–117)
BILIRUBIN DIRECT: 0.1 mg/dL (ref 0.0–0.3)
Total Bilirubin: 0.9 mg/dL (ref 0.3–1.2)
Total Protein: 7 g/dL (ref 6.0–8.3)

## 2013-09-30 LAB — LIPID PANEL
CHOLESTEROL: 220 mg/dL — AB (ref 0–200)
HDL: 54.6 mg/dL (ref >39.00)
LDL Cholesterol: 129 mg/dL — ABNORMAL HIGH (ref 0–99)
Total CHOL/HDL Ratio: 4
Triglycerides: 184 mg/dL — ABNORMAL HIGH (ref 0.0–149.0)
VLDL: 36.8 mg/dL (ref 0.0–40.0)

## 2013-09-30 LAB — TSH: TSH: 1.66 u[IU]/mL (ref 0.35–5.50)

## 2013-10-07 ENCOUNTER — Ambulatory Visit (INDEPENDENT_AMBULATORY_CARE_PROVIDER_SITE_OTHER): Payer: BC Managed Care – PPO | Admitting: Internal Medicine

## 2013-10-07 ENCOUNTER — Encounter: Payer: Self-pay | Admitting: Internal Medicine

## 2013-10-07 VITALS — BP 116/74 | Temp 98.5°F | Ht 61.5 in | Wt 141.0 lb

## 2013-10-07 DIAGNOSIS — R195 Other fecal abnormalities: Secondary | ICD-10-CM

## 2013-10-07 DIAGNOSIS — Z23 Encounter for immunization: Secondary | ICD-10-CM

## 2013-10-07 DIAGNOSIS — K219 Gastro-esophageal reflux disease without esophagitis: Secondary | ICD-10-CM

## 2013-10-07 DIAGNOSIS — E039 Hypothyroidism, unspecified: Secondary | ICD-10-CM

## 2013-10-07 DIAGNOSIS — E785 Hyperlipidemia, unspecified: Secondary | ICD-10-CM

## 2013-10-07 DIAGNOSIS — R059 Cough, unspecified: Secondary | ICD-10-CM

## 2013-10-07 DIAGNOSIS — R053 Chronic cough: Secondary | ICD-10-CM

## 2013-10-07 DIAGNOSIS — R05 Cough: Secondary | ICD-10-CM

## 2013-10-07 DIAGNOSIS — J45909 Unspecified asthma, uncomplicated: Secondary | ICD-10-CM | POA: Insufficient documentation

## 2013-10-07 DIAGNOSIS — Z0000 Encounter for general adult medical examination without abnormal findings: Secondary | ICD-10-CM

## 2013-10-07 DIAGNOSIS — Z79899 Other long term (current) drug therapy: Secondary | ICD-10-CM

## 2013-10-07 DIAGNOSIS — R058 Other specified cough: Secondary | ICD-10-CM | POA: Insufficient documentation

## 2013-10-07 MED ORDER — PANTOPRAZOLE SODIUM 40 MG PO TBEC: 40.0000 mg | DELAYED_RELEASE_TABLET | Freq: Every day | ORAL | Status: DC

## 2013-10-07 NOTE — Patient Instructions (Signed)
Add  Acid blocker . For the heart burn .  Could be adding to the cough  Prolonged cough?  For now stay on same asthma meds  Get appt with Dr Maple HudsonYoung  For about a month from now about the cough and asthma . Try probiotic and consider short term lactose elimination diet  For the loose stools .  Plan fu assessment in 3 months dependong on how you are doing. May need to see der Buccini about the gi sx  .

## 2013-10-07 NOTE — Progress Notes (Signed)
Chief Complaint  Patient presents with  . Annual Exam  . Asthma    HPI: Patient comes in today for Preventive Health Care visit   since last visit .  Coughing from January .  Some better but continues like last year. prd didn't help last year . Using rescue once a dya but no sob .  To start new job uncertain about insurance  Plan yet. [facility]  To get better houres  Days mon through Friday   P;lanning to optimize . lsi with new job to help lipids. weight  Having heart burn for a while  taking prilosec or zantac and still get this no vomiting feels from central body weight   perimenopausal  Periods restarted  Had colp cin 1 following, noted to have rash and test for hsv probably old.    P;lanning to optimize . lsi with new job  Health Maintenance  Topic Date Due  . Influenza Vaccine  02/27/2014  . Mammogram  04/16/2015  . Pap Smear  08/12/2016  . Colonoscopy  12/08/2019  . Tetanus/tdap  02/21/2021   Health Maintenance Review   ROS:  GEN/ HEENT: No fever, significant weight changes sweats headaches vision problems hearing changes, CV/ PULM; No chest pain shortness of breath cough, syncope,edema  change in exercise tolerance. GI /GU: No adominal pain, vomiting, change in bowel habits.  Has ongoing loose stools taking imodium No blood in the stool. No significant GU symptoms. SKIN/HEME: ,no acute skin rashes suspicious lesions or bleeding. No lymphadenopathy, nodules, masses.  NEURO/ PSYCH:  No neurologic signs such as weakness numbness. No depression anxiety. IMM/ Allergy: No unusual infections.  Allergy .  Asthma as above  REST of 12 system review negative except as per HPI   Past Medical History  Diagnosis Date  . Asthma   . Depression   . Allergic rhinitis   . Hypothyroidism   . HPV in female 511984  . Hemorrhoids   . Diarrhea 08/09/2011    Poss ibs  Vs other  Related to stress  consdier other eval if needed. Had colonoscoopy per dr Matthias HughsBuccini   . Medication  side effect 11/01/2011    tussionex   distrubed sleep     Family History  Problem Relation Age of Onset  . Heart disease Sister [id number]    cabg stent  . Arthritis Mother   . Heart disease Mother   . Heart disease Father   . Colon cancer Father   . Pulmonary embolism Daughter   . Heart disease Sister   . Thyroid disease Sister   . Parkinson's disease Father     History   Social History  . Marital Status: Legally Separated    Spouse Name: N/A    Number of Children: N/A  . Years of Education: N/A   Social History Main Topics  . Smoking status: Never Smoker   . Smokeless tobacco: Never Used  . Alcohol Use: Yes     Comment: once a month  . Drug Use: No  . Sexual Activity: Yes    Birth Control/ Protection: None   Other Topics Concern  . 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   Pets 2 cats 1 dog to move    Outpatient Encounter  Prescriptions as of 10/07/2013  Medication Sig  . albuterol (PROAIR HFA) 108 (90 BASE) MCG/ACT inhaler Inhale 2 puffs into the lungs every 6 (six) hours as needed for wheezing.  Marland Kitchen aspirin EC 81 MG tablet Take 1 tablet (81 mg total) by mouth daily.  . Cholecalciferol (VITAMIN D) 2000 UNITS tablet Take 2,000 Units by mouth daily.  Marland Kitchen co-enzyme Q-10 30 MG capsule Take 30 mg by mouth 3 (three) times daily.  . Cranberry 500 MG CAPS Take 1 capsule by mouth daily.  . Doxylamine Succinate, Sleep, (UNISOM PO) Take 1 tablet by mouth at bedtime.  Marland Kitchen escitalopram (LEXAPRO) 20 MG tablet Take 1 tablet (20 mg total) by mouth daily.  . Estradiol (VAGIFEM) 10 MCG TABS vaginal tablet Place vaginally. Using twice weekly  . lithium carbonate (LITHOBID) 300 MG CR tablet TAKE ONE TABLET BY MOUTH TWICE DAILY  . mometasone (ASMANEX 60 METERED DOSES) 220 MCG/INH inhaler USE 2 PUFFS BY MOUTH DAILY  . MULTIPLE VITAMIN PO Take  by mouth.  . valACYclovir (VALTREX) 500 MG tablet   . metroNIDAZOLE (METROGEL) 0.75 % gel Apply topically daily.  . pantoprazole (PROTONIX) 40 MG tablet Take 1 tablet (40 mg total) by mouth daily.  . phenazopyridine (PYRIDIUM) 200 MG tablet Take 1 tablet (200 mg total) by mouth 3 (three) times daily as needed for pain.  Marland Kitchen sulfamethoxazole-trimethoprim (SEPTRA DS) 800-160 MG per tablet 1 po as directed for prevention UTI  . SYNTHROID 75 MCG tablet TAKE 1 TABLET DAILY    EXAM:  BP 116/74  Temp(Src) 98.5 F (36.9 C) (Oral)  Ht 5' 1.5" (1.562 m)  Wt 141 lb (63.957 kg)  BMI 26.21 kg/m2  Body mass index is 26.21 kg/(m^2).  Physical Exam: Vital signs reviewed [id number] is a well-developed well-nourished alert cooperative    who appearsr stated age in no acute distress.  HEENT: normocephalic atraumatic , Eyes: PERRL EOM's full, conjunctiva clear, Nares: paten,t no deformity discharge or tenderness., Ears: no deformity EAC's clear TMs with normal landmarks. Mouth: clear OP, no lesions, edema.  Moist mucous membranes. Dentition in adequate repair. NECK: supple without masses, thyromegaly or bruits. CHEST/PULM:  Clear to auscultation and percussion breath sounds equal no wheeze , rales or rhonchi. No chest wall deformities or tenderness. CV: PMI is nondisplaced, S1 S2 no gallops, murmurs, rubs. Peripheral pulses are full without delay.No JVD .  ABDOMEN: Bowel sounds normal nontender  No guard or rebound, no hepato splenomegal no CVA tenderness.  No hernia. Extremtities:  No clubbing cyanosis or edema, no acute joint swelling or redness no focal atrophy NEURO:  Oriented x3, cranial nerves 3-12 appear to be intact, no obvious focal weakness,gait within normal limits no abnormal reflexes or asymmetrical SKIN: No acute rashes normal turgor, color, no bruising or petechiae. PSYCH: Oriented, good eye contact, no obvious depression anxiety, cognition and judgment appear normal. LN: no cervical axillary  inguinal adenopathy  Lab Results  Component Value Date   WBC 7.9 09/30/2013   HGB 13.4 09/30/2013   HCT 41.2 09/30/2013   PLT 274.0 09/30/2013   GLUCOSE 77 09/30/2013   CHOL 220* 09/30/2013   TRIG 184.0* 09/30/2013   HDL 54.60 09/30/2013   LDLDIRECT 189.3 06/02/2012   LDLCALC 129* 09/30/2013   ALT 27 09/30/2013   AST 21 09/30/2013   NA 140 09/30/2013   K 4.9 09/30/2013   CL 108 09/30/2013   CREATININE 0.8 09/30/2013   BUN 15 09/30/2013   CO2 25 09/30/2013   TSH 1.66 09/30/2013  ASSESSMENT AND PLAN:  Discussed the following assessment and plan:  Visit for preventive health examination - prevnar.  Medication management  HYPOTHYROIDISM - same dose   HYPERLIPIDEMIA - could be better lsi   Need for vaccination with 13-polyvalent pneumococcal conjugate vaccine - Plan: Pneumococcal conjugate vaccine 13-valent  Cough, persistent - after rti   Asthma  GERD (gastroesophageal reflux disease) - daily hb poss aggravaing resp status  change to protonix rov in 1-2 month see pulm in a month or as needed   Loose stools - taking immodium try probiotic and lactose free no recenet antibiotic  may need to see Dr Chapman Moss if persists  Patient Care Team: Madelin Headings, MD as PCP - General Ernestina Penna, MD (Ophthalmology) Kirkland Hun, MD (Obstetrics and Gynecology) Saul Fordyce, NP as Nurse Practitioner (Nurse Practitioner) Patient Instructions  Add  Acid blocker . For the heart burn .  Could be adding to the cough  Prolonged cough?  For now stay on same asthma meds  Get appt with Dr Maple Hudson  For about a month from now about the cough and asthma . Try probiotic and consider short term lactose elimination diet  For the loose stools .  Plan fu assessment in 3 months dependong on how you are doing. May need to see der Buccini about the gi sx  .       Neta Mends. Bellatrix Devonshire M.D.  Pre visit review using our clinic review tool, if applicable. No additional management support is needed unless otherwise documented  below in the visit note.

## 2013-12-14 ENCOUNTER — Encounter: Payer: Self-pay | Admitting: Internal Medicine

## 2013-12-15 ENCOUNTER — Encounter: Payer: Self-pay | Admitting: Physician Assistant

## 2013-12-15 ENCOUNTER — Ambulatory Visit (INDEPENDENT_AMBULATORY_CARE_PROVIDER_SITE_OTHER): Payer: BC Managed Care – PPO | Admitting: Physician Assistant

## 2013-12-15 VITALS — BP 108/78 | HR 70 | Temp 99.0°F | Resp 18 | Wt 145.0 lb

## 2013-12-15 DIAGNOSIS — W57XXXA Bitten or stung by nonvenomous insect and other nonvenomous arthropods, initial encounter: Secondary | ICD-10-CM

## 2013-12-15 DIAGNOSIS — T148 Other injury of unspecified body region: Secondary | ICD-10-CM

## 2013-12-15 NOTE — Progress Notes (Signed)
Pre visit review using our clinic review tool, if applicable. No additional management support is needed unless otherwise documented below in the visit note. 

## 2013-12-15 NOTE — Patient Instructions (Signed)
Continue to watch the area of the tick bite or increased inflammation. No prophylactic antibiotic is required at this point.  Return to clinic if you develop a fever, or nausea, vomiting, diarrhea, or unusual arthralgias, and also if the swelling and inflammation increases and becomes painful.  Followup as in about 3 weeks to reassess, or sooner if symptoms worsen.   Tick Bite Information Ticks are insects that attach themselves to the skin. There are many types of ticks. Common types include wood ticks and deer ticks. Sometimes, ticks carry diseases that can make a person very ill. The most common places for ticks to attach themselves are the scalp, neck, armpits, waist, and groin.  HOW CAN YOU PREVENT TICK BITES? Take these steps to help prevent tick bites when you are outdoors:  Wear long sleeves and long pants.  Wear white clothes so you can see ticks more easily.  Tuck your pant legs into your socks.  If walking on a trail, stay in the middle of the trail to avoid brushing against bushes.  Avoid walking through areas with long grass.  Put bug spray on all skin that is showing and along boot tops, pant legs, and sleeve cuffs.  Check clothes, hair, and skin often and before going inside.  Brush off any ticks that are not attached.  Take a shower or bath as soon as possible after being outdoors. HOW SHOULD YOU REMOVE A TICK? Ticks should be removed as soon as possible to help prevent diseases. 1. If latex gloves are available, put them on before trying to remove a tick. 2. Use tweezers to grasp the tick as close to the skin as possible. You may also use curved forceps or a tick removal tool. Grasp the tick as close to its head as possible. Avoid grasping the tick on its body. 3. Pull gently upward until the tick lets go. Do not twist the tick or jerk it suddenly. This may break off the tick's head or mouth parts. 4. Do not squeeze or crush the tick's body. This could force  disease-carrying fluids from the tick into your body. 5. After the tick is removed, wash the bite area and your hands with soap and water or alcohol. 6. Apply a small amount of antiseptic cream or ointment to the bite site. 7. Wash any tools that were used. Do not try to remove a tick by applying a hot match, petroleum jelly, or fingernail polish to the tick. These methods do not work. They may also increase the chances of disease being spread from the tick bite. WHEN SHOULD YOU SEEK HELP? Contact your health care provider if you are unable to remove a tick or if a part of the tick breaks off in the skin. After a tick bite, you need to watch for signs and symptoms of diseases that can be spread by ticks. Contact your health care provider if you develop any of the following:  Fever.  Rash.  Redness and puffiness (swelling) in the area of the tick bite.  Tender, puffy lymph glands.  Watery poop (diarrhea).  Weight loss.  Cough.  Feeling more tired than normal (fatigue).  Muscle, joint, or bone pain.  Belly (abdominal) pain.  Headache.  Change in your level of consciousness.  Trouble walking or moving your legs.  Loss of feeling (numbness) in the legs.  Loss of movement (paralysis).  Shortness of breath.  Confusion.  Throwing up (vomiting) many times. Document Released: 10/10/2009 Document Revised: 03/18/2013 Document  Reviewed: 12/24/2012 [employer] Patient Information 2014 [place].

## 2013-12-15 NOTE — Progress Notes (Signed)
Subjective:    Patient ID: Lisa Lee, female    DOB: 06/18/1961, 53 y.o.   MRN: [id number]  HPI Patient is a 53 year old Caucasian female presenting to the clinic for recent tick bite. Patient states that about one week ago she noticed an inguinal worse tick on her right lower abdominal area while in the shower. She immediately removed it, and believes that no body part of the tick was left behind in the bite. She states that the previous day she had been working outside in her yard. She states that she has dogs that she treats for ticks , and she has a lot of woody areas in her yard. She estimates that the tick was attached for less than 36 hours. She states that a day later the area was slightly red and itchy. She states that the appearance has not changed much over the past week. She states that she has tried Bactroban ointment and hydrocortisone cream which have provided no relief. She denies fevers, chills, nausea, vomiting, diarrhea, and shortness of breath.   Review of Systems As per the history of present illness and are otherwise negative  Past Medical History  Diagnosis Date  . Asthma   . Depression   . Allergic rhinitis   . Hypothyroidism   . 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 Hemorrhoids   . Diarrhea 08/09/2011    Poss ibs  Vs other  Related to stress  consdier other eval if needed. Had colonoscoopy per dr Matthias HughsBuccini   . Medication side effect 11/01/2011    tussionex   distrubed sleep    Past Surgical History  Procedure Laterality Date  . Hysteroscopy      uterine polypectomy Jan 7th  . Tonsillectomy    . Cervical cryotherapy  1984  . Lymph node biopsy    . Dilation and curettage, diagnostic / therapeutic  1994    Blighted Ovum  . Hysteroscopy with resectoscope  08/04/2009    Removed polyp & IUD  . Colposcopy  08/26/13    reports that she has never smoked. She has never used smokeless tobacco. She reports that she drinks alcohol. She reports that she does not use illicit  drugs. family history includes Arthritis in her mother; Colon cancer in her father; Heart disease in her father, mother, and sister; Heart disease (age of onset: 6557) in her sister; Parkinson's disease in her father; Pulmonary embolism in her daughter; Thyroid disease in her sister. Allergies  Allergen Reactions  . Penicillins        Objective:   Physical Exam  Nursing note and vitals reviewed. Constitutional: She is oriented to person, place, and time. She appears well-developed and well-nourished. No distress.  HENT:  Head: Normocephalic and atraumatic.  Eyes: Conjunctivae and EOM are normal. Pupils are equal, round, and reactive to light.  Neck: Normal range of motion. Neck supple.  Cardiovascular: Normal rate, regular rhythm, normal heart sounds and intact distal pulses.  Exam reveals no gallop and no friction rub.   No murmur heard. Pulmonary/Chest: Effort normal and breath sounds normal. No respiratory distress. She has no wheezes. She has no rales. She exhibits no tenderness.  Musculoskeletal: Normal range of motion.  Neurological: She is alert and oriented to person, place, and time.  Skin: Skin is warm and dry. No rash noted. She is not diaphoretic. There is erythema. No pallor.  Right lower abdominal/femoral area: There is a small subcentimeter raise bump which is slightly erythematous. It has a central  puncture wound. There is no foreign body inside the puncture wound. The area is nontender to palpation, there is no fluctuance. There is no swelling beneath the skin. There is a 3-4 cm x 0.5 cm band of faint erythema overlying the puncture wound. This is nontender to palpation, not warm to the touch, no palpable mass, no fluctuance.  Psychiatric: She has a normal mood and affect. Her behavior is normal. Judgment and thought content normal.    Filed Vitals:   12/15/13 0849  BP: 108/78  Pulse: 70  Temp: 99 F (37.2 C)  Resp: 18    Lab Results  Component Value Date   WBC 7.9  09/30/2013   HGB 13.4 09/30/2013   HCT 41.2 09/30/2013   PLT 274.0 09/30/2013   GLUCOSE 77 09/30/2013   CHOL 220* 09/30/2013   TRIG 184.0* 09/30/2013   HDL 54.60 09/30/2013   LDLDIRECT 189.3 06/02/2012   LDLCALC 129* 09/30/2013   ALT 27 09/30/2013   AST 21 09/30/2013   NA 140 09/30/2013   K 4.9 09/30/2013   CL 108 09/30/2013   CREATININE 0.8 09/30/2013   BUN 15 09/30/2013   CO2 25 09/30/2013   TSH 1.66 09/30/2013      Assessment & Plan:  Lisa Lee was seen today for tick removal.  Diagnoses and associated orders for this visit:  Tick bite - Some faint local reaction to the tick bite with itching. No pain or swelling is present. No obvious sign of infection. This does not appear to require antibiotics at this time. Pt will continue to monitor at home for increased swelling, tenderness, and other symptoms. Return precautions provided.  Plan to follow up in approximately 3 weeks to reassess.  Patient Instructions  Continue to watch the area of the tick bite or increased inflammation. No prophylactic antibiotic is required at this point.  Return to clinic if you develop a fever, or nausea, vomiting, diarrhea, or unusual arthralgias, and also if the swelling and inflammation increases and becomes painful.  Followup as in about 3 weeks to reassess, or sooner if symptoms worsen.

## 2013-12-17 ENCOUNTER — Other Ambulatory Visit: Payer: Self-pay | Admitting: Internal Medicine

## 2013-12-17 NOTE — Telephone Encounter (Signed)
Sent to the pharmacy by e-scribe. 

## 2014-01-02 ENCOUNTER — Other Ambulatory Visit: Payer: Self-pay | Admitting: Internal Medicine

## 2014-01-06 ENCOUNTER — Ambulatory Visit: Payer: BC Managed Care – PPO | Admitting: Internal Medicine

## 2014-02-05 ENCOUNTER — Ambulatory Visit (INDEPENDENT_AMBULATORY_CARE_PROVIDER_SITE_OTHER): Payer: BC Managed Care – PPO | Admitting: Internal Medicine

## 2014-02-05 ENCOUNTER — Encounter: Payer: Self-pay | Admitting: Internal Medicine

## 2014-02-05 VITALS — BP 122/76 | Temp 98.1°F | Ht 61.5 in | Wt 144.0 lb

## 2014-02-05 DIAGNOSIS — K219 Gastro-esophageal reflux disease without esophagitis: Secondary | ICD-10-CM

## 2014-02-05 DIAGNOSIS — N390 Urinary tract infection, site not specified: Secondary | ICD-10-CM | POA: Insufficient documentation

## 2014-02-05 DIAGNOSIS — R195 Other fecal abnormalities: Secondary | ICD-10-CM

## 2014-02-05 DIAGNOSIS — W57XXXA Bitten or stung by nonvenomous insect and other nonvenomous arthropods, initial encounter: Secondary | ICD-10-CM

## 2014-02-05 DIAGNOSIS — T148 Other injury of unspecified body region: Secondary | ICD-10-CM

## 2014-02-05 MED ORDER — SULFAMETHOXAZOLE-TRIMETHOPRIM 800-160 MG PO TABS: ORAL_TABLET | ORAL | Status: DC

## 2014-02-05 NOTE — Progress Notes (Signed)
Pre visit review using our clinic review tool, if applicable. No additional management support is needed unless otherwise documented below in the visit note.  Chief Complaint  Patient presents with  . Follow-up    HPI: Lisa Lee  comes in today for follow up of   medical problems. Cough gi ..   gerd: cough and hb pantaprazole helped the cough     Helped a lot . Taking daily now feels normal resp and upper gi .  Now working  At [employer] and doing well.  ocass diarrhea poss dietary.  No fever blood etc .  Refill septra for  IC  uti prophylaxis   Had dx hsv recently  Rash on backside had fu pap hpv.   [facility] getting new practitioner   Took a while for tick bite to get better  nosx still a red area no fever or unusual itching at this time or other rash. ROS: See pertinent positives and negatives per HPI. No syncope depression excess anxiety. Currently no shortness of breath chest pain or wheezing. Thyroid medicine no change. Last check was in March.  Past Medical History  Diagnosis Date  . Asthma   . Depression   . Allergic rhinitis   . Hypothyroidism   . HPV in female 46  . Hemorrhoids   . Diarrhea 08/09/2011    Poss ibs  Vs other  Related to stress  consdier other eval if needed. Had colonoscoopy per dr Matthias Hughs   . Medication side effect 11/01/2011    tussionex   distrubed sleep     Family History  Problem Relation Age of Onset  . Heart disease Sister 29    cabg stent  . Arthritis Mother   . Heart disease Mother   . Heart disease Father   . Colon cancer Father   . Pulmonary embolism Daughter   . Heart disease Sister   . Thyroid disease Sister   . Parkinson's disease Father     History   Social History  . Marital Status: Legally Separated    Spouse Name: N/A    Number of Children: N/A  . Years of Education: N/A   Social History Main Topics  . Smoking status: Never Smoker   . Smokeless tobacco: Never Used  . Alcohol  Use: Yes     Comment: once a month  . Drug Use: No  . Sexual Activity: Yes    Birth Control/ Protection: None   Other Topics Concern  . 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 albuterol (PROAIR HFA) 108 (90 BASE) MCG/ACT inhaler Inhale 2 puffs into the lungs every 6 (six) hours as needed for wheezing.  Marland Kitchen aspirin EC 81 MG tablet Take 1 tablet (81 mg total) by mouth daily.  . Cholecalciferol (VITAMIN D) 2000 UNITS tablet Take 2,000 Units by mouth daily.  Marland Kitchen co-enzyme Q-10 30 MG capsule Take 30 mg by mouth 3 (three) times daily.  . Cranberry 500 MG CAPS Take 1 capsule by mouth daily.  . Doxylamine Succinate, Sleep, (UNISOM PO) Take 1 tablet by mouth at bedtime.  Marland Kitchen escitalopram (LEXAPRO)  20 MG tablet Take 1 tablet (20 mg total) by mouth daily.  . Estradiol (VAGIFEM) 10 MCG TABS vaginal tablet Place vaginally. Using twice weekly  . lithium carbonate (LITHOBID) 300 MG CR tablet TAKE ONE TABLET BY MOUTH TWICE DAILY  . metroNIDAZOLE (METROGEL) 0.75 % gel Apply topically daily.  . mometasone (ASMANEX 60 METERED DOSES) 220 MCG/INH inhaler USE 2 PUFFS BY MOUTH DAILY  . MULTIPLE VITAMIN PO Take by mouth.  . pantoprazole (PROTONIX) 40 MG tablet Take 1 tablet (40 mg total) by mouth daily.  Marland Kitchen. sulfamethoxazole-trimethoprim (SEPTRA DS) 800-160 MG per tablet 1 po as directed for prevention UTI  . SYNTHROID 75 MCG tablet TAKE 1 TABLET DAILY  . valACYclovir (VALTREX) 500 MG tablet   . [DISCONTINUED] sulfamethoxazole-trimethoprim (SEPTRA DS) 800-160 MG per tablet 1 po as directed for prevention UTI  . phenazopyridine (PYRIDIUM) 200 MG tablet Take 1 tablet (200 mg total) by mouth 3 (three) times daily as needed for pain.    EXAM:  BP 122/76   Temp(Src) 98.1 F (36.7 C) (Oral)  Ht 5' 1.5" (1.562 m)  Wt 144 lb (65.318 kg)  BMI 26.77 kg/m2  Body mass index is 26.77 kg/(m^2).  GENERAL: vitals reviewed and listed above, alert, oriented, appears well hydrated and in no acute distress HEENT: atraumatic, conjunctiva  clear, no obvious abnormalities on inspection of external nose and ears NECK: no obvious masses on inspection palpation  LUNGS: clear to auscultation bilaterally, no wheezes, rales or rhonchi, good air movement CV: HRRR, no clubbing cyanosis or  peripheral edema nl cap refill  MS: moves all extremities without noticeable focal  Abnormality Skin papule right groin non tender  PSYCH: pleasant and cooperative, no obvious depression or anxiety Lab Results  Component Value Date   WBC 7.9 09/30/2013   HGB 13.4 09/30/2013   HCT 41.2 09/30/2013   PLT 274.0 09/30/2013   GLUCOSE 77 09/30/2013   CHOL 220* 09/30/2013   TRIG 184.0* 09/30/2013   HDL 54.60 09/30/2013   LDLDIRECT 189.3 06/02/2012   LDLCALC 129* 09/30/2013   ALT 27 09/30/2013   AST 21 09/30/2013   NA 140 09/30/2013   K 4.9 09/30/2013   CL 108 09/30/2013   CREATININE 0.8 09/30/2013   BUN 15 09/30/2013   CO2 25 09/30/2013   TSH 1.66 09/30/2013    ASSESSMENT AND PLAN:  Discussed the following assessment and plan:  Gastroesophageal reflux disease, esophagitis presence not specified - cough and hb totally gone on protonix  disc wena s possible  for now call for refills   Tick bite - better   Loose stools - only ocass  poss food related   Recurrent UTI - Refill medication for when necessary prophylaxis  -Patient advised to return or notify health care team  if symptoms worsen ,persist or new concerns arise.  Patient Instructions  Glad you are doing better. Plan on weaning slowly and just use least amount to control symptoms.  Call for refill . Consider   probitoic if diarrhea continues .  Food Choices for Gastroesophageal Reflux Disease When you have gastroesophageal reflux disease  (GERD), the foods you eat and your eating habits are very important. Choosing the right foods can help ease the discomfort of GERD. WHAT GENERAL GUIDELINES DO I NEED TO FOLLOW?  Choose fruits, vegetables, whole grains, low-fat dairy products, and low-fat meat, fish, and poultry.  Limit fats such as oils, salad dressings, butter, nuts, and avocado.  Keep a food diary to identify foods that cause symptoms.  Avoid foods that cause reflux. These may be different for different people.  Eat frequent small meals instead of three large meals each day.  Eat your meals slowly, in a relaxed setting.  Limit fried foods.  Cook foods using methods other than frying.  Avoid drinking alcohol.  Avoid drinking large amounts of liquids with your meals.  Avoid bending over or lying down until 2-3 hours after eating. WHAT FOODS ARE NOT RECOMMENDED? The following are some foods and drinks that may worsen your symptoms: Vegetables Tomatoes. Tomato juice. Tomato and spaghetti sauce. Chili peppers. Onion and garlic. Horseradish. Fruits Oranges, grapefruit, and lemon (fruit and juice). Meats High-fat meats, fish, and poultry. This includes hot dogs, ribs, ham, sausage, salami, and bacon. Dairy Whole milk and chocolate milk. Sour cream. Cream. Butter. Ice cream. Cream cheese.  Beverages Coffee and tea, with or without caffeine. Carbonated beverages or energy drinks. Condiments Hot sauce. Barbecue sauce.  Sweets/Desserts Chocolate and cocoa. Donuts. Peppermint and spearmint. Fats and Oils High-fat foods, including [place] fries and potato chips. Other Vinegar. Strong spices, such as black pepper, white pepper, red pepper, cayenne, curry powder, cloves, ginger, and chili powder. The items listed above may not be a complete list of foods and beverages to avoid. Contact your dietitian for more information. Document Released: 07/16/2005 Document Revised: 07/21/2013 Document Reviewed:  05/20/2013 [facility] Patient Information 2015 [place], [place]. This information is not intended to replace advice given to you by your health care provider. Make sure you discuss any questions you have with your health care provider.     Neta Mends. Panosh M.D.

## 2014-02-05 NOTE — Patient Instructions (Signed)
Glad you are doing better. Plan on weaning slowly and just use least amount to control symptoms.  Call for refill . Consider   probitoic if diarrhea continues .  Food Choices for Gastroesophageal Reflux Disease When you have gastroesophageal reflux disease (GERD), the foods you eat and your eating habits are very important. Choosing the right foods can help ease the discomfort of GERD. WHAT GENERAL GUIDELINES DO I NEED TO FOLLOW?  Choose fruits, vegetables, whole grains, low-fat dairy products, and low-fat meat, fish, and poultry.  Limit fats such as oils, salad dressings, butter, nuts, and avocado.  Keep a food diary to identify foods that cause symptoms.  Avoid foods that cause reflux. These may be different for different people.  Eat frequent small meals instead of three large meals each day.  Eat your meals slowly, in a relaxed setting.  Limit fried foods.  Cook foods using methods other than frying.  Avoid drinking alcohol.  Avoid drinking large amounts of liquids with your meals.  Avoid bending over or lying down until 2-3 hours after eating. WHAT FOODS ARE NOT RECOMMENDED? The following are some foods and drinks that may worsen your symptoms: Vegetables Tomatoes. Tomato juice. Tomato and spaghetti sauce. Chili peppers. Onion and garlic. Horseradish. Fruits Oranges, grapefruit, and lemon (fruit and juice). Meats High-fat meats, fish, and poultry. This includes hot dogs, ribs, ham, sausage, salami, and bacon. Dairy Whole milk and chocolate milk. Sour cream. Cream. Butter. Ice cream. Cream cheese.  Beverages Coffee and tea, with or without caffeine. Carbonated beverages or energy drinks. Condiments Hot sauce. Barbecue sauce.  Sweets/Desserts Chocolate and cocoa. Donuts. Peppermint and spearmint. Fats and Oils High-fat foods, including [place] fries and potato chips. Other Vinegar. Strong spices, such as black pepper, white pepper, red pepper, cayenne, curry powder,  cloves, ginger, and chili powder. The items listed above may not be a complete list of foods and beverages to avoid. Contact your dietitian for more information. Document Released: 07/16/2005 Document Revised: 07/21/2013 Document Reviewed: 05/20/2013 [facility] Patient Information 2015 [place], [place]. This information is not intended to replace advice given to you by your health care provider. Make sure you discuss any questions you have with your health care provider.

## 2014-04-09 ENCOUNTER — Other Ambulatory Visit: Payer: Self-pay | Admitting: Internal Medicine

## 2014-04-12 NOTE — Telephone Encounter (Signed)
LM on home/cell for the pt to return my call 

## 2014-04-12 NOTE — Telephone Encounter (Signed)
Do not see that pt has made an appt with Dr. Maple Hudson.

## 2014-04-12 NOTE — Telephone Encounter (Signed)
Ask patient if she is still having cough    if still coughin  Or asthma not in control  needs to see dr Maple Hudson    Otherwise if  asthma is controlled we can refill medication  For  6 months until her wellness check in feb 16

## 2014-04-13 ENCOUNTER — Other Ambulatory Visit: Payer: Self-pay | Admitting: Family Medicine

## 2014-04-13 ENCOUNTER — Telehealth: Payer: Self-pay | Admitting: Family Medicine

## 2014-04-13 DIAGNOSIS — Z0000 Encounter for general adult medical examination without abnormal findings: Secondary | ICD-10-CM

## 2014-04-13 NOTE — Telephone Encounter (Signed)
Per [facility], this patient needs her yearly wellness in February 2016.  I have placed the lab orders.  Please call the patient and make both appointments.  Lab work should be fasting. Thanks!

## 2014-04-13 NOTE — Telephone Encounter (Signed)
LM on home/cell for the pt to return my call 

## 2014-04-13 NOTE — Telephone Encounter (Signed)
Pt called back and left a message on my machine.  She stated her cough is well controlled and the Asmanex works "beautifully."  Stated she does have an occasional episode of cough at night. Sent to the pharmacy by e-scribe.

## 2014-04-13 NOTE — Telephone Encounter (Signed)
LM on voicemail for the pt to return my call. 

## 2014-04-14 ENCOUNTER — Encounter: Payer: Self-pay | Admitting: Internal Medicine

## 2014-04-14 NOTE — Telephone Encounter (Signed)
lmom for pt to sch phy °

## 2014-04-15 NOTE — Telephone Encounter (Signed)
Pt has already been sch °

## 2014-04-26 ENCOUNTER — Other Ambulatory Visit: Payer: Self-pay | Admitting: Internal Medicine

## 2014-04-27 NOTE — Telephone Encounter (Signed)
Sent to the pharmacy by e-scribe. 

## 2014-05-11 ENCOUNTER — Encounter (HOSPITAL_COMMUNITY): Payer: Self-pay | Admitting: Emergency Medicine

## 2014-05-11 ENCOUNTER — Emergency Department (HOSPITAL_COMMUNITY): Payer: BC Managed Care – PPO

## 2014-05-11 ENCOUNTER — Emergency Department (HOSPITAL_COMMUNITY)
Admission: EM | Admit: 2014-05-11 | Discharge: 2014-05-11 | Disposition: A | Discharge status: Home / Self Care | Payer: BC Managed Care – PPO | Attending: Emergency Medicine | Admitting: Emergency Medicine

## 2014-05-11 DIAGNOSIS — E039 Hypothyroidism, unspecified: Secondary | ICD-10-CM | POA: Insufficient documentation

## 2014-05-11 DIAGNOSIS — Z7952 Long term (current) use of systemic steroids: Secondary | ICD-10-CM | POA: Insufficient documentation

## 2014-05-11 DIAGNOSIS — R091 Pleurisy: Secondary | ICD-10-CM | POA: Diagnosis not present

## 2014-05-11 DIAGNOSIS — R0789 Other chest pain: Secondary | ICD-10-CM | POA: Diagnosis not present

## 2014-05-11 DIAGNOSIS — Z880 Allergy status to penicillin: Secondary | ICD-10-CM | POA: Insufficient documentation

## 2014-05-11 DIAGNOSIS — J45901 Unspecified asthma with (acute) exacerbation: Secondary | ICD-10-CM | POA: Diagnosis not present

## 2014-05-11 DIAGNOSIS — Z8709 Personal history of other diseases of the respiratory system: Secondary | ICD-10-CM | POA: Insufficient documentation

## 2014-05-11 DIAGNOSIS — Z7982 Long term (current) use of aspirin: Secondary | ICD-10-CM | POA: Diagnosis not present

## 2014-05-11 DIAGNOSIS — F329 Major depressive disorder, single episode, unspecified: Secondary | ICD-10-CM | POA: Diagnosis not present

## 2014-05-11 DIAGNOSIS — Z79899 Other long term (current) drug therapy: Secondary | ICD-10-CM | POA: Insufficient documentation

## 2014-05-11 DIAGNOSIS — Z8719 Personal history of other diseases of the digestive system: Secondary | ICD-10-CM | POA: Insufficient documentation

## 2014-05-11 DIAGNOSIS — Z8619 Personal history of other infectious and parasitic diseases: Secondary | ICD-10-CM | POA: Diagnosis not present

## 2014-05-11 DIAGNOSIS — R110 Nausea: Secondary | ICD-10-CM | POA: Diagnosis present

## 2014-05-11 LAB — CBC WITH DIFFERENTIAL/PLATELET
Basophils Absolute: 0 10*3/uL (ref 0.0–0.1)
Basophils Relative: 0 % (ref 0–1)
EOS ABS: 0.4 10*3/uL (ref 0.0–0.7)
Eosinophils Relative: 3 % (ref 0–5)
HEMATOCRIT: 39.7 % (ref 36.0–46.0)
Hemoglobin: 13.5 g/dL (ref 12.0–15.0)
LYMPHS PCT: 23 % (ref 12–46)
Lymphs Abs: 2.6 10*3/uL (ref 0.7–4.0)
MCH: 30 pg (ref 26.0–34.0)
MCHC: 34 g/dL (ref 30.0–36.0)
MCV: 88.2 fL (ref 78.0–100.0)
MONO ABS: 0.7 10*3/uL (ref 0.1–1.0)
Monocytes Relative: 6 % (ref 3–12)
Neutro Abs: 7.4 10*3/uL (ref 1.7–7.7)
Neutrophils Relative %: 68 % (ref 43–77)
Platelets: 342 10*3/uL (ref 150–400)
RBC: 4.5 MIL/uL (ref 3.87–5.11)
RDW: 11.9 % (ref 11.5–15.5)
WBC: 11.1 10*3/uL — AB (ref 4.0–10.5)

## 2014-05-11 LAB — COMPREHENSIVE METABOLIC PANEL
ALT: 19 U/L (ref 0–35)
AST: 18 U/L (ref 0–37)
Albumin: 4.3 g/dL (ref 3.5–5.2)
Alkaline Phosphatase: 67 U/L (ref 39–117)
Anion gap: 14 (ref 5–15)
BUN: 20 mg/dL (ref 6–23)
CO2: 23 meq/L (ref 19–32)
CREATININE: 0.8 mg/dL (ref 0.50–1.10)
Calcium: 10 mg/dL (ref 8.4–10.5)
Chloride: 103 mEq/L (ref 96–112)
GFR, EST NON AFRICAN AMERICAN: 82 mL/min — AB (ref >90)
Glucose, Bld: 97 mg/dL (ref 70–99)
Potassium: 4 mEq/L (ref 3.7–5.3)
Sodium: 140 mEq/L (ref 137–147)
TOTAL PROTEIN: 7.9 g/dL (ref 6.0–8.3)
Total Bilirubin: <0.2 mg/dL — ABNORMAL LOW (ref 0.3–1.2)

## 2014-05-11 LAB — D-DIMER, QUANTITATIVE (NOT AT ARMC)

## 2014-05-11 LAB — TROPONIN I

## 2014-05-11 MED ORDER — NAPROXEN 500 MG PO TABS: 500.0000 mg | ORAL_TABLET | Freq: Two times a day (BID) | ORAL | Status: DC

## 2014-05-11 MED ORDER — HYDROCODONE-ACETAMINOPHEN 5-325 MG PO TABS: 2.0000 | ORAL_TABLET | ORAL | Status: DC | PRN

## 2014-05-11 MED ORDER — HYDROCODONE-ACETAMINOPHEN 5-325 MG PO TABS
1.0000 | ORAL_TABLET | Freq: Once | ORAL | Status: AC
  Administered 2014-05-11: 1 via ORAL
  Filled 2014-05-11: qty 1

## 2014-05-11 MED ORDER — ONDANSETRON 4 MG PO TBDP
8.0000 mg | ORAL_TABLET | Freq: Once | ORAL | Status: AC
  Administered 2014-05-11: 8 mg via ORAL
  Filled 2014-05-11: qty 2

## 2014-05-11 MED ORDER — MORPHINE SULFATE 4 MG/ML IJ SOLN: 4.0000 mg | INTRAMUSCULAR | Status: DC | PRN

## 2014-05-11 MED ORDER — ONDANSETRON HCL 4 MG/2ML IJ SOLN
4.0000 mg | Freq: Once | INTRAMUSCULAR | Status: AC
  Administered 2014-05-11: 4 mg via INTRAVENOUS
  Filled 2014-05-11: qty 2

## 2014-05-11 MED ORDER — IOHEXOL 350 MG/ML SOLN
100.0000 mL | Freq: Once | INTRAVENOUS | Status: AC | PRN
  Administered 2014-05-11: 100 mL via INTRAVENOUS

## 2014-05-11 MED ORDER — KETOROLAC TROMETHAMINE 30 MG/ML IJ SOLN
30.0000 mg | Freq: Once | INTRAMUSCULAR | Status: AC
  Administered 2014-05-11: 30 mg via INTRAVENOUS
  Filled 2014-05-11: qty 1

## 2014-05-11 NOTE — ED Notes (Signed)
Patient arrives with complaint of upper mid back pain, nausea, dyspnea, and malaise. States that it began this morning when she woke. Was seen at urgent care today but was told to come to ED for further evaluation. Specifically she was unable to have an EKG done because of equipment issue. Had chest X-ray completed which was negative. Denies history of cardiac issues, but previous hx of pleurisy which felt similar.

## 2014-05-11 NOTE — Discharge Instructions (Signed)
Chest Wall Pain °Chest wall pain is pain in or around the bones and muscles of your chest. It may take up to 6 weeks to get better. It may take longer if you must stay physically active in your work and activities.  °CAUSES  °Chest wall pain may happen on its own. However, it may be caused by: °· A viral illness like the flu. °· Injury. °· Coughing. °· Exercise. °· Arthritis. °· Fibromyalgia. °· Shingles. °HOME CARE INSTRUCTIONS  °· Avoid overtiring physical activity. Try not to strain or perform activities that cause pain. This includes any activities using your chest or your abdominal and side muscles, especially if heavy weights are used. °· Put ice on the sore area. °¨ Put ice in a plastic bag. °¨ Place a towel between your skin and the bag. °¨ Leave the ice on for 15-20 minutes per hour while awake for the first 2 days. °· Only take over-the-counter or prescription medicines for pain, discomfort, or fever as directed by your caregiver. °SEEK IMMEDIATE MEDICAL CARE IF:  °· Your pain increases, or you are very uncomfortable. °· You have a fever. °· Your chest pain becomes worse. °· You have new, unexplained symptoms. °· You have nausea or vomiting. °· You feel sweaty or lightheaded. °· You have a cough with phlegm (sputum), or you cough up blood. °MAKE SURE YOU:  °· Understand these instructions. °· Will watch your condition. °· Will get help right away if you are not doing well or get worse. °Document Released: 07/16/2005 Document Revised: 10/08/2011 Document Reviewed: 03/12/2011 °ExitCare® Patient Information ©2015 [employer]. This information is not intended to replace advice given to you by your health care provider. Make sure you discuss any questions you have with your health care provider. ° ° ° ° °Pleurisy °Pleurisy is an inflammation and swelling of the lining of the lungs (pleura). Because of this inflammation, it hurts to breathe. It can be aggravated by coughing, laughing, or deep breathing.  Pleurisy is often caused by an underlying infection or disease.  °HOME CARE INSTRUCTIONS  °Monitor your pleurisy for any changes. The following actions may help to alleviate any discomfort you are experiencing: °· Medicine may help with pain. Only take over-the-counter or prescription medicines for pain, discomfort, or fever as directed by your health care provider. °· Only take antibiotic medicine as directed. Make sure to finish it even if you start to feel better. °SEEK MEDICAL CARE IF:  °· Your pain is not controlled with medicine or is increasing. °· You have an increase in pus-like (purulent) secretions brought up with coughing. °SEEK IMMEDIATE MEDICAL CARE IF:  °· You have blue or dark lips, fingernails, or toenails. °· You are coughing up blood. °· You have increased difficulty breathing. °· You have continuing pain unrelieved by medicine or pain lasting more than 1 week. °· You have pain that radiates into your neck, arms, or jaw. °· You develop increased shortness of breath or wheezing. °· You develop a fever, rash, vomiting, fainting, or other serious symptoms. °MAKE SURE YOU: °· Understand these instructions.   °· Will watch your condition.   °· Will get help right away if you are not doing well or get worse. °  °Document Released: 07/16/2005 Document Revised: 03/18/2013 Document Reviewed: 12/28/2012 °ExitCare® Patient Information ©2015 [employer]. This information is not intended to replace advice given to you by your health care provider. Make sure you discuss any questions you have with your health care provider. ° °

## 2014-05-11 NOTE — ED Provider Notes (Signed)
CSN: [id number]     Arrival date & time 05/11/14  1958 History   First MD Initiated Contact with Patient 05/11/14 2053     Chief Complaint  Patient presents with  . Back Pain  . Shortness of Breath  . Nausea     HPI Patient presents for evaluation of chest pain. Per Margot AblesMerrill he back and adjacent her scapula. Hurts to cough or laugh or breathe or move. Is on her to palpate. Concerned because her daughter and mother both had PEs. Seen in urgent care and was given concerns are for possible dissection. His history is DVT or PE. No recent prolonged immobilization cast was fractures surgeries malignancies or DVT or PE risk. No history of heart disease. No history of hypertension or diabetes. Lifetime nonsmoker. She is not on supplemental estrogens.  Past Medical History  Diagnosis Date  . Asthma   . Depression   . Allergic rhinitis   . Hypothyroidism   . 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 Hemorrhoids   . Diarrhea 08/09/2011    Poss ibs  Vs other  Related to stress  consdier other eval if needed. Had colonoscoopy per dr Matthias HughsBuccini   . Medication side effect 11/01/2011    tussionex   distrubed sleep    Past Surgical History  Procedure Laterality Date  . Hysteroscopy      uterine polypectomy Jan 7th  . Tonsillectomy    . Cervical cryotherapy  1984  . Lymph node biopsy    . Dilation and curettage, diagnostic / therapeutic  1994    Blighted Ovum  . Hysteroscopy with resectoscope  08/04/2009    Removed polyp & IUD  . Colposcopy  08/26/13   Family History  Problem Relation Age of Onset  . Heart disease Sister [id number]    cabg stent  . Arthritis Mother   . Heart disease Mother   . Heart disease Father   . Colon cancer Father   . Pulmonary embolism Daughter   . Heart disease Sister   . Thyroid disease Sister   . Parkinson's disease Father    History  Substance Use Topics  . Smoking status: Never Smoker   . Smokeless tobacco: Never Used  . Alcohol Use: Yes     Comment: once a month   OB History   Grav Para Term Preterm Abortions TAB SAB Ect Mult Living   6         2     Review of Systems  Constitutional: Positive for appetite change. Negative for fever, chills, diaphoresis and fatigue.  HENT: Negative for mouth sores, sore throat and trouble swallowing.   Eyes: Negative for visual disturbance.  Respiratory: Positive for shortness of breath. Negative for cough, chest tightness and wheezing.   Cardiovascular: Positive for chest pain.  Gastrointestinal: Positive for abdominal pain. Negative for nausea, vomiting, diarrhea and abdominal distention.  Endocrine: Negative for polydipsia, polyphagia and polyuria.  Genitourinary: Negative for dysuria, frequency and hematuria.  Musculoskeletal: Negative for gait problem.  Skin: Negative for color change, pallor and rash.  Neurological: Negative for dizziness, syncope, light-headedness and headaches.  Hematological: Does not bruise/bleed easily.  Psychiatric/Behavioral: Negative for behavioral problems and confusion.      Allergies  Penicillins  Home Medications   Prior to Admission medications   Medication Sig Start Date End Date Taking? Authorizing Provider  albuterol (PROAIR HFA) 108 (90 BASE) MCG/ACT inhaler Inhale 2 puffs into the lungs every 6 (six) hours as needed for wheezing. 04/21/13  Yes  Madelin HeadingsWanda K Panosh, MD  aspirin EC 81 MG tablet Take 1 tablet (81 mg total) by mouth daily. 08/28/11  Yes Laurey Moralealton S McLean, MD  Cholecalciferol (VITAMIN D) 2000 UNITS tablet Take 2,000 Units by mouth daily.   Yes Historical Provider, MD  Coenzyme Q10 (COQ10) 100 MG CAPS Take 100 mg by mouth daily.   Yes Historical Provider, MD  Cranberry 500 MG CAPS Take 500 mg by mouth daily.    Yes Historical Provider, MD  Doxylamine Succinate, Sleep, (UNISOM PO) Take 0.5 tablets by mouth at bedtime as needed (for sleep).    Yes Historical Provider, MD  escitalopram (LEXAPRO) 20 MG tablet Take 1 tablet (20 mg total) by mouth daily. 02/11/13  Yes Madelin HeadingsWanda K Panosh, MD   Estradiol (VAGIFEM) 10 MCG TABS vaginal tablet Place 1 each vaginally 2 (two) times a week.    Yes Historical Provider, MD  ibuprofen (ADVIL,MOTRIN) 200 MG tablet Take 400 mg by mouth every 6 (six) hours as needed.   Yes Historical Provider, MD  levothyroxine (SYNTHROID, LEVOTHROID) 75 MCG tablet Take 75 mcg by mouth daily before breakfast.   Yes Historical Provider, MD  lithium carbonate (LITHOBID) 300 MG CR tablet Take 300 mg by mouth 2 (two) times daily.   Yes Historical Provider, MD  mometasone [facility](ASMANEX) 220 MCG/INH inhaler Inhale 2 puffs into the lungs daily.   Yes Historical Provider, MD  pantoprazole (PROTONIX) 40 MG tablet Take 40 mg by mouth every other day.   Yes Historical Provider, MD  phenylephrine (SUDAFED PE) 10 MG TABS tablet Take 10 mg by mouth every 4 (four) hours as needed.   Yes Historical Provider, MD  sulfamethoxazole-trimethoprim (SEPTRA DS) 800-160 MG per tablet 1 po as directed for prevention UTI 02/05/14  Yes Madelin HeadingsWanda K Panosh, MD  HYDROcodone-acetaminophen (NORCO/VICODIN) 5-325 MG per tablet Take 2 tablets by mouth every 4 (four) hours as needed. 05/11/14   Rolland PorterMark Raylen Tangonan, MD  naproxen (NAPROSYN) 500 MG tablet Take 1 tablet (500 mg total) by mouth 2 (two) times daily. 05/11/14   Rolland PorterMark [place] Krotz, MD   BP 149/72  Pulse 70  Temp(Src) 98.9 F (37.2 C) (Oral)  Resp 22  Ht 5' 1.5" (1.562 m)  Wt 144 lb (65.318 kg)  BMI 26.77 kg/m2  SpO2 95%  LMP 12/09/2013 Physical Exam  Constitutional: She is oriented to person, place, and time. She appears well-developed and well-nourished. No distress.  HENT:  Head: Normocephalic.  Eyes: Conjunctivae are normal. Pupils are equal, round, and reactive to light. No scleral icterus.  Neck: Normal range of motion. Neck supple. No thyromegaly present.  Cardiovascular: Normal rate and regular rhythm.  Exam reveals no gallop and no friction rub.   No murmur heard. Pulmonary/Chest: Effort normal and breath sounds normal. No respiratory distress. She  has no wheezes. She has no rales.    Abdominal: Soft. Bowel sounds are normal. She exhibits no distension. There is no tenderness. There is no rebound.    Musculoskeletal: Normal range of motion.  Neurological: She is alert and oriented to person, place, and time.  Skin: Skin is warm and dry. No rash noted.  Psychiatric: She has a normal mood and affect. Her behavior is normal.    ED Course  Procedures (including critical care time) Labs Review Labs Reviewed  CBC WITH DIFFERENTIAL - Abnormal; Notable for the following:    WBC 11.1 (*)    All other components within normal limits  COMPREHENSIVE METABOLIC PANEL - Abnormal; Notable for the following:  Total Bilirubin <0.2 (*)    GFR calc non Af Amer 82 (*)    All other components within normal limits  TROPONIN I  D-DIMER, QUANTITATIVE  I-STAT TROPOININ, ED    Imaging Review Ct Angio Chest Pe W/cm &/or Wo Cm  05/11/2014   CLINICAL DATA:  Chest pain and shortness of breath. Evaluate for pulmonary embolism. Initial encounter.  EXAM: CT ANGIOGRAPHY CHEST WITH CONTRAST  TECHNIQUE: Multidetector CT imaging of the chest was performed using the standard protocol during bolus administration of intravenous contrast. Multiplanar CT image reconstructions and MIPs were obtained to evaluate the vascular anatomy.  CONTRAST:  OMNIPAQUE IOHEXOL 350 MG/ML SOLN  COMPARISON:  Chest CTA- 09/10/2011  FINDINGS: Vascular Findings:  There is adequate opacification of the pulmonary arterial system with the main pulmonary artery measuring 253 Hounsfield units. No discrete filling defects within the pulmonary arterial tree to suggest pulmonary embolism.  Normal caliber the main pulmonary artery.  Normal heart size. No pericardial effusion. Normal caliber of the thoracic aorta. Conventional configuration of the aortic arch. The branch vessels of the aortic arch widely patent throughout their imaged course. No thoracic aortic dissection or periaortic  stranding.  Review of the MIP images confirms the above findings.   ----------------------------------------------------------------------------------  Nonvascular Findings:  Evaluation of the pulmonary parenchyma is minimally degraded due to patient respiratory artifact.  There is minimal diffuse interstitial thickening, most conspicuous within the bilateral lower lobes, favored to represent atelectasis. No discrete focal airspace opacities. No pleural effusion or pneumothorax. The central pulmonary airways are widely patent.  Note is made of a punctate (approximately 4 mm) noncalcified subpleural nodule within the right middle lobe (image 41, series 6). No mediastinal, hilar axillary lymphadenopathy.  Limited early arterial phase evaluation of the upper abdomen is normal.  No acute or aggressive osseus abnormalities. 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  Normal appearance of the imaged portions of the thyroid gland. Regional soft tissues appear normal.  Review of the MIP images confirms the above findings.  IMPRESSION: 1. No acute cardiopulmonary disease. Specifically, no evidence of pulmonary embolism. 2. Indeterminate punctate (approximately 4 mm) noncalcified nodule within the subpleural aspect of the right middle lobe, not definitely seen on prior chest CT performed 08/2011. If the patient is at high risk for bronchogenic carcinoma, follow-up chest CT at 1 year is recommended. If the patient is at low risk, no follow-up is needed. This recommendation follows the consensus statement: Guidelines for Management of Small Pulmonary Nodules Detected on CT Scans: A Statement from the Fleischner Society as published in Radiology 2005; 237:395-400.   Electronically Signed   By: Simonne Come M.D.   On: 05/11/2014 23:05     EKG Interpretation None      MDM   Final diagnoses:  Chest wall pain  Pleurisy    No PE. Normal aorta. Normal in size. Normal EKG. Plan is home treatment for  pleurisy.    Rolland Porter, MD 05/11/14 [phone]

## 2014-05-12 ENCOUNTER — Encounter: Payer: Self-pay | Admitting: Internal Medicine

## 2014-05-14 ENCOUNTER — Encounter: Payer: Self-pay | Admitting: Internal Medicine

## 2014-05-17 ENCOUNTER — Encounter: Payer: Self-pay | Admitting: Internal Medicine

## 2014-05-31 ENCOUNTER — Encounter (HOSPITAL_COMMUNITY): Payer: Self-pay | Admitting: Emergency Medicine

## 2014-07-20 ENCOUNTER — Telehealth: Payer: Self-pay | Admitting: Internal Medicine

## 2014-07-20 MED ORDER — LEVOTHYROXINE SODIUM 75 MCG PO TABS: 75.0000 ug | ORAL_TABLET | Freq: Every day | ORAL | Status: DC

## 2014-07-20 NOTE — Telephone Encounter (Signed)
Sent to the pharmacy by e-scribe. 

## 2014-07-20 NOTE — Telephone Encounter (Signed)
Patient needs 7 days of levothyroxine (SYNTHROID, LEVOTHROID) 75 MCG tablet sent to [facility], [place] - [place],  - [address]  She is waiting for her mail order to come and [facility] authorized 7 days for retail.

## 2014-08-10 ENCOUNTER — Other Ambulatory Visit: Payer: Self-pay | Admitting: Obstetrics and Gynecology

## 2014-09-22 ENCOUNTER — Encounter: Payer: Self-pay | Admitting: Internal Medicine

## 2014-09-23 NOTE — Telephone Encounter (Signed)
Order for lab work placed in the mail.

## 2014-10-06 ENCOUNTER — Other Ambulatory Visit: Payer: BC Managed Care – PPO

## 2014-10-12 ENCOUNTER — Encounter: Payer: BC Managed Care – PPO | Admitting: Internal Medicine

## 2014-10-18 ENCOUNTER — Other Ambulatory Visit: Payer: Self-pay | Admitting: Internal Medicine

## 2014-10-18 ENCOUNTER — Encounter: Payer: Self-pay | Admitting: Internal Medicine

## 2014-10-19 MED ORDER — LEVOTHYROXINE SODIUM 75 MCG PO TABS: 75.0000 ug | ORAL_TABLET | Freq: Every day | ORAL | Status: DC

## 2014-10-19 NOTE — Telephone Encounter (Signed)
Declined.  Sent in earlier today for 90 days.

## 2014-10-19 NOTE — Telephone Encounter (Signed)
Has CPE appt on 10/26/14

## 2014-10-26 ENCOUNTER — Ambulatory Visit (INDEPENDENT_AMBULATORY_CARE_PROVIDER_SITE_OTHER): Payer: BLUE CROSS/BLUE SHIELD | Admitting: Internal Medicine

## 2014-10-26 ENCOUNTER — Encounter: Payer: Self-pay | Admitting: Internal Medicine

## 2014-10-26 VITALS — BP 122/76 | Temp 98.0°F | Ht 61.25 in | Wt 145.0 lb

## 2014-10-26 DIAGNOSIS — E785 Hyperlipidemia, unspecified: Secondary | ICD-10-CM

## 2014-10-26 DIAGNOSIS — E039 Hypothyroidism, unspecified: Secondary | ICD-10-CM | POA: Diagnosis not present

## 2014-10-26 DIAGNOSIS — Z0000 Encounter for general adult medical examination without abnormal findings: Secondary | ICD-10-CM

## 2014-10-26 DIAGNOSIS — R194 Change in bowel habit: Secondary | ICD-10-CM

## 2014-10-26 NOTE — Patient Instructions (Signed)
Intensify lifestyle interventions. To help the triglycerides  Will notify you  of labs when available. Can try Add fiber psyllium based  daily incase   Helps .  See dr Matthias HughsBuccini about the bowel changes . For his advice      Why follow it? Research shows. . Those who follow the Mediterranean diet have a reduced risk of heart disease  . The diet is associated with a reduced incidence of Parkinson's and Alzheimer's diseases . People following the diet may have longer life expectancies and lower rates of chronic diseases  . The Dietary Guidelines for Americans recommends the Mediterranean diet as an eating plan to promote health and prevent disease  What Is the Mediterranean Diet?  . Healthy eating plan based on typical foods and recipes of Mediterranean-style cooking . The diet is primarily a plant based diet; these foods should make up a majority of meals   Starches - Plant based foods should make up a majority of meals - They are an important sources of vitamins, minerals, energy, antioxidants, and fiber - Choose whole grains, foods high in fiber and minimally processed items  - Typical grain sources include wheat, oats, barley, corn, brown rice, bulgar, farro, millet, polenta, couscous  - Various types of beans include chickpeas, lentils, fava beans, black beans, white beans   Fruits  Veggies - Large quantities of antioxidant rich fruits & veggies; 6 or more servings  - Vegetables can be eaten raw or lightly drizzled with oil and cooked  - Vegetables common to the traditional Mediterranean Diet include: artichokes, arugula, beets, broccoli, brussel sprouts, cabbage, carrots, celery, collard greens, cucumbers, eggplant, kale, leeks, lemons, lettuce, mushrooms, okra, onions, peas, peppers, potatoes, pumpkin, radishes, rutabaga, shallots, spinach, sweet potatoes, turnips, zucchini - Fruits common to the Mediterranean Diet include: apples, apricots, avocados, cherries, clementines, dates, figs,  grapefruits, grapes, melons, nectarines, oranges, peaches, pears, pomegranates, strawberries, tangerines  Fats - Replace butter and margarine with healthy oils, such as olive oil, canola oil, and tahini  - Limit nuts to no more than a handful a day  - Nuts include walnuts, almonds, pecans, pistachios, pine nuts  - Limit or avoid candied, honey roasted or heavily salted nuts - Olives are central to the [employer]iterranean diet - can be eaten whole or used in a variety of dishes   Meats Protein - Limiting red meat: no more than a few times a month - When eating red meat: choose lean cuts and keep the portion to the size of deck of cards - Eggs: approx. 0 to 4 times a week  - Fish and lean poultry: at least 2 a week  - Healthy protein sources include, chicken, [place], lean beef, lamb - Increase intake of seafood such as tuna, salmon, trout, mackerel, shrimp, scallops - Avoid or limit high fat processed meats such as sausage and bacon  Dairy - Include moderate amounts of low fat dairy products  - Focus on healthy dairy such as fat free yogurt, skim milk, low or reduced fat cheese - Limit dairy products higher in fat such as whole or 2% milk, cheese, ice cream  Alcohol - Moderate amounts of red wine is ok  - No more than 5 oz daily for women (all ages) and men older than age 55  - No more than 10 oz of wine daily for men younger than 4965  Other - Limit sweets and other desserts  - Use herbs and spices instead of salt to flavor foods  -  Herbs and spices common to the traditional Mediterranean Diet include: basil, bay leaves, chives, cloves, cumin, fennel, garlic, lavender, marjoram, mint, oregano, parsley, pepper, rosemary, sage, savory, sumac, tarragon, thyme   It's not just a diet, it's a lifestyle:  . The Mediterranean diet includes lifestyle factors typical of those in the region  . Foods, drinks and meals are best eaten with others and savored . Daily physical activity is important for overall good  health . This could be strenuous exercise like running and aerobics . This could also be more leisurely activities such as walking, housework, yard-work, or taking the stairs . Moderation is the key; a balanced and healthy diet accommodates most foods and drinks . Consider portion sizes and frequency of consumption of certain foods   Meal Ideas & Options:  . Breakfast:  o Whole wheat toast or whole wheat English muffins with peanut butter & hard boiled egg o Steel cut oats topped with apples & cinnamon and skim milk  o Fresh fruit: banana, strawberries, melon, berries, peaches  o Smoothies: strawberries, bananas, greek yogurt, peanut butter o Low fat greek yogurt with blueberries and granola  o Egg white omelet with spinach and mushrooms o Breakfast couscous: whole wheat couscous, apricots, skim milk, cranberries  . Sandwiches:  o Hummus and grilled vegetables (peppers, zucchini, squash) on whole wheat bread   o Grilled chicken on whole wheat pita with lettuce, tomatoes, cucumbers or tzatziki  o Tuna salad on whole wheat bread: tuna salad made with greek yogurt, olives, red peppers, capers, green onions o Garlic rosemary lamb pita: lamb sauted with garlic, rosemary, salt & pepper; add lettuce, cucumber, greek yogurt to pita - flavor with lemon juice and black pepper  . Seafood:  o Mediterranean grilled salmon, seasoned with garlic, basil, parsley, lemon juice and black pepper o Shrimp, lemon, and spinach whole-grain pasta salad made with low fat greek yogurt  o Seared scallops with lemon orzo  o Seared tuna steaks seasoned salt, pepper, coriander topped with tomato mixture of olives, tomatoes, olive oil, minced garlic, parsley, green onions and cappers  . Meats:  o Herbed greek chicken salad with kalamata olives, cucumber, feta  o Red bell peppers stuffed with spinach, bulgur, lean ground beef (or lentils) & topped with feta   o Kebabs: skewers of chicken, tomatoes, onions, zucchini,  squash  o [place] burgers: made with red onions, mint, dill, lemon juice, feta cheese topped with roasted red peppers . Vegetarian o Cucumber salad: cucumbers, artichoke hearts, celery, red onion, feta cheese, tossed in olive oil & lemon juice  o Hummus and whole grain pita points with a greek salad (lettuce, tomato, feta, olives, cucumbers, red onion) o Lentil soup with celery, carrots made with vegetable broth, garlic, salt and pepper  o Tabouli salad: parsley, bulgur, mint, scallions, cucumbers, tomato, radishes, lemon juice, olive oil, salt and pepper.

## 2014-10-26 NOTE — Progress Notes (Signed)
Pre visit review using our clinic review tool, if applicable. No additional management support is needed unless otherwise documented below in the visit note.  Chief Complaint  Patient presents with  . Annual Exam    HPI: Patient  Lisa Lee  54 y.o. comes in today for Preventive Health Care visit  No major change in health status since last visit . May need to change psych rx insurance . Thyroid ok  GI may be due for colon having diarrhea alt const but taking immodium no blood weight loss Asthma stable  Health Maintenance  Topic Date Due  . HIV Screening  09/28/2015 (Originally 10/30/1975)  . INFLUENZA VACCINE  02/28/2015  . MAMMOGRAM  07/02/2016  . PAP SMEAR  07/12/2017  . TETANUS/TDAP  02/21/2021  . COLONOSCOPY  07/12/2024   Health Maintenance Review LIFESTYLE:  Exercise:   Gym  a few times a week.  Tobacco/ETS: no Alcohol:  Minimal  Sugar beverages: sugar in tea   Sleep:   7-8 hours  Drug use: no:  Colonoscopy: buccinin fam hx  PAP:abn pap . Colposcopy.  Ok  MAMMO: dec 1 mckinney .  Changing   Physi.    coverage .  Evaluation .   ROS:  GEN/ HEENT: No fever, significant weight changes sweats headaches vision problems hearing changes, CV/ PULM; No chest pain shortness of breath cough, syncope,edema  change in exercise tolerance. GI /GU: No adominal pain, vomiting,. No blood in the stool. No significant GU symptoms. SKIN/HEME: ,no acute skin rashes suspicious lesions or bleeding. No lymphadenopathy, nodules, masses.  NEURO/ PSYCH:  No neurologic signs such as weakness numbness. No depression anxiety. IMM/ Allergy: No unusual infections.  Allergy .   REST of 12 system review negative except as per HPI   Past Medical History  Diagnosis Date  . Asthma   . Depression   . Allergic rhinitis   . Hypothyroidism   . 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 Hemorrhoids   . Diarrhea 08/09/2011    Poss ibs  Vs other  Related to stress  consdier other eval if needed. Had colonoscoopy per  dr Matthias Hughs   . Medication side effect 11/01/2011    tussionex   distrubed sleep     Past Surgical History  Procedure Laterality Date  . Hysteroscopy      uterine polypectomy Jan 7th  . Tonsillectomy    . Cervical cryotherapy  1984  . Lymph node biopsy    . Dilation and curettage, diagnostic / therapeutic  1994    Blighted Ovum  . Hysteroscopy with resectoscope  08/04/2009    Removed polyp & IUD  . Colposcopy  08/26/13    Family History  Problem Relation Age of Onset  . Heart disease Sister 97    cabg stent  . Arthritis Mother   . Heart disease Mother   . Heart disease Father   . Colon cancer Father   . Pulmonary embolism Daughter   . Heart disease Sister   . Thyroid disease Sister   . Parkinson's disease Father     History   Social History  . Marital Status: Legally Separated    Spouse Name: N/A  . Number of Children: N/A  . Years of Education: N/A   Social History Main Topics  . Smoking status: Never Smoker   . Smokeless tobacco: Never Used  . Alcohol Use: Yes     Comment: once a month  . Drug Use: No  . Sexual Activity: Yes  Birth Control/ Protection: None   Other Topics Concern  . None   Social History Narrative    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 albuterol (PROAIR HFA) 108 (90 BASE) MCG/ACT inhaler Inhale 2 puffs into the lungs every 6 (six) hours as needed for wheezing.  Marland Kitchen. aspirin EC 81 MG tablet Take 1 tablet (81 mg total) by mouth daily.  . Cholecalciferol (VITAMIN D) 2000 UNITS tablet Take 2,000 Units by mouth daily.  . Coenzyme Q10 (CO Q-10) 200 MG CAPS Take 1 capsule by mouth daily.  . Doxylamine Succinate, Sleep, (UNISOM PO) Take 0.5 tablets by mouth at bedtime as needed (for sleep).   Marland Kitchen.  escitalopram (LEXAPRO) 20 MG tablet Take 1 tablet (20 mg total) by mouth daily.  . Estradiol (VAGIFEM) 10 MCG TABS vaginal tablet Place 1 each vaginally 2 (two) times a week.   Marland Kitchen. ibuprofen (ADVIL,MOTRIN) 200 MG tablet Take 400 mg by mouth every 6 (six) hours as needed.  Marland Kitchen. levothyroxine (SYNTHROID, LEVOTHROID) 75 MCG tablet Take 1 tablet (75 mcg total) by mouth daily before breakfast.  . lithium carbonate (LITHOBID) 300 MG CR tablet Take 300 mg by mouth 2 (two) times daily.  . mometasone (ASMANEX) 220 MCG/INH inhaler Inhale 2 puffs into the lungs daily.  . pantoprazole (PROTONIX) 40 MG tablet Take 40 mg by mouth every other day.  . phenylephrine (SUDAFED PE) 10 MG TABS tablet Take 10 mg by mouth every 4 (four) hours as needed.  . sulfamethoxazole-trimethoprim (SEPTRA DS) 800-160 MG per tablet 1 po as directed for prevention UTI  . [DISCONTINUED] Coenzyme Q10 (COQ10) 100 MG CAPS Take 100 mg by mouth daily.  . [DISCONTINUED] Cranberry 500 MG CAPS Take 500 mg by mouth daily.   . [DISCONTINUED] HYDROcodone-acetaminophen (NORCO/VICODIN) 5-325 MG per tablet Take 2 tablets by mouth every 4 (four) hours as needed.  . [DISCONTINUED] naproxen (NAPROSYN) 500 MG tablet Take 1 tablet (500 mg total) by mouth 2 (two) times daily.    EXAM:  BP 122/76 mmHg  Temp(Src) 98 F (36.7 C) (Oral)  Ht 5' 1.25" (1.556 m)  Wt 145 lb (65.772 kg)  BMI 27.17 kg/m2  Body mass index is 27.17 kg/(m^2).  Physical Exam: Vital signs reviewed ZOX:WRUEGEN:This is a well-developed well-nourished alert cooperative    who appearsr stated age in no acute distress.  HEENT: normocephalic atraumatic , Eyes: PERRL EOM's full, conjunctiva clear, Nares: paten,t no deformity discharge or tenderness., Ears: no deformity EAC's clear TMs with normal landmarks. Mouth: clear OP, no lesions, edema.  Moist mucous membranes. Dentition in adequate repair. NECK: supple without masses, thyromegaly or bruits. CHEST/PULM:  Clear to auscultation and  percussion breath sounds equal no wheeze , rales or rhonchi. No chest wall deformities or tenderness. CV: PMI is nondisplaced, S1 S2 no gallops, murmurs, rubs. Peripheral pulses are full without delay.No JVD .  Breast: normal by inspection . No dimpling, discharge, masses, tenderness or discharge . ABDOMEN: Bowel sounds normal nontender  No guard or rebound, no hepato splenomegal no CVA tenderness.  No hernia. Extremtities:  No clubbing cyanosis or edema, no acute joint swelling or redness no focal atrophy NEURO:  Oriented x3, cranial  nerves 3-12 appear to be intact, no obvious focal weakness,gait within normal limits no abnormal reflexes or asymmetrical SKIN: No acute rashes normal turgor, color, no bruising or petechiae. PSYCH: Oriented, good eye contact, no obvious depression anxiety, cognition and judgment appear normal. LN: no cervical axillary inguinal adenopathy Labs lab corp nl tsh .897 scan in  Lipids 254 tg 277 hdl 51 ldl 148  Calcium 10.4 alb 4.6 cr .87  ASSESSMENT AND PLAN:  Discussed the following assessment and plan:  Visit for preventive health examination - utd  Hypothyroidism, unspecified hypothyroidism type - at goal  Hyperlipidemia - low risk ascvd guidlines and had low riskcacs in past  Serum calcium elevated - repeat with pth hydrated today - Plan: PTH, intact and calcium  Altered bowel habits - see dr Matthias Hughs may be due for colon anyway  Patient Care Team: Madelin Headings, MD as PCP - General Loletha Carrow, MD (Ophthalmology) Kirkland Hun, MD (Obstetrics and Gynecology) Bernette Redbird, MD as Consulting Physician (Gastroenterology) Patient Instructions   Intensify lifestyle interventions. To help the triglycerides  Will notify you  of labs when available. Can try Add fiber psyllium based  daily incase   Helps .  See dr Matthias Hughs about the bowel changes . For his advice      Why follow it? Research shows. . Those who follow the Mediterranean diet have a  reduced risk of heart disease  . The diet is associated with a reduced incidence of Parkinson's and Alzheimer's diseases . People following the diet may have longer life expectancies and lower rates of chronic diseases  . The Dietary Guidelines for Americans recommends the Mediterranean diet as an eating plan to promote health and prevent disease  What Is the Mediterranean Diet?  . Healthy eating plan based on typical foods and recipes of Mediterranean-style cooking . The diet is primarily a plant based diet; these foods should make up a majority of meals   Starches - Plant based foods should make up a majority of meals - They are an important sources of vitamins, minerals, energy, antioxidants, and fiber - Choose whole grains, foods high in fiber and minimally processed items  - Typical grain sources include wheat, oats, barley, corn, brown rice, bulgar, farro, millet, polenta, couscous  - Various types of beans include chickpeas, lentils, fava beans, black beans, white beans   Fruits  Veggies - Large quantities of antioxidant rich fruits & veggies; 6 or more servings  - Vegetables can be eaten raw or lightly drizzled with oil and cooked  - Vegetables common to the traditional Mediterranean Diet include: artichokes, arugula, beets, broccoli, brussel sprouts, cabbage, carrots, celery, collard greens, cucumbers, eggplant, kale, leeks, lemons, lettuce, mushrooms, okra, onions, peas, peppers, potatoes, pumpkin, radishes, rutabaga, shallots, spinach, sweet potatoes, turnips, zucchini - Fruits common to the Mediterranean Diet include: apples, apricots, avocados, cherries, clementines, dates, figs, grapefruits, grapes, melons, nectarines, oranges, peaches, pears, pomegranates, strawberries, tangerines  Fats - Replace butter and margarine with healthy oils, such as olive oil, canola oil, and tahini  - Limit nuts to no more than a handful a day  - Nuts include walnuts, almonds, pecans, pistachios, pine  nuts  - Limit or avoid candied, honey roasted or heavily salted nuts - Olives are central to the [employer] - can be eaten whole or used in a variety of dishes   Meats Protein - Limiting red meat: no more than a few times a month - When eating red meat: choose lean cuts and  keep the portion to the size of deck of cards - Eggs: approx. 0 to 4 times a week  - Fish and lean poultry: at least 2 a week  - Healthy protein sources include, chicken, [place], lean beef, lamb - Increase intake of seafood such as tuna, salmon, trout, mackerel, shrimp, scallops - Avoid or limit high fat processed meats such as sausage and bacon  Dairy - Include moderate amounts of low fat dairy products  - Focus on healthy dairy such as fat free yogurt, skim milk, low or reduced fat cheese - Limit dairy products higher in fat such as whole or 2% milk, cheese, ice cream  Alcohol - Moderate amounts of red wine is ok  - No more than 5 oz daily for women (all ages) and men older than age 30  - No more than 10 oz of wine daily for men younger than 79  Other - Limit sweets and other desserts  - Use herbs and spices instead of salt to flavor foods  - Herbs and spices common to the traditional Mediterranean Diet include: basil, bay leaves, chives, cloves, cumin, fennel, garlic, lavender, marjoram, mint, oregano, parsley, pepper, rosemary, sage, savory, sumac, tarragon, thyme   It's not just a diet, it's a lifestyle:  . The Mediterranean diet includes lifestyle factors typical of those in the region  . Foods, drinks and meals are best eaten with others and savored . Daily physical activity is important for overall good health . This could be strenuous exercise like running and aerobics . This could also be more leisurely activities such as walking, housework, yard-work, or taking the stairs . Moderation is the key; a balanced and healthy diet accommodates most foods and drinks . Consider portion sizes and frequency of  consumption of certain foods   Meal Ideas & Options:  . Breakfast:  o Whole wheat toast or whole wheat English muffins with peanut butter & hard boiled egg o Steel cut oats topped with apples & cinnamon and skim milk  o Fresh fruit: banana, strawberries, melon, berries, peaches  o Smoothies: strawberries, bananas, greek yogurt, peanut butter o Low fat greek yogurt with blueberries and granola  o Egg white omelet with spinach and mushrooms o Breakfast couscous: whole wheat couscous, apricots, skim milk, cranberries  . Sandwiches:  o Hummus and grilled vegetables (peppers, zucchini, squash) on whole wheat bread   o Grilled chicken on whole wheat pita with lettuce, tomatoes, cucumbers or tzatziki  o Tuna salad on whole wheat bread: tuna salad made with greek yogurt, olives, red peppers, capers, green onions o Garlic rosemary lamb pita: lamb sauted with garlic, rosemary, salt & pepper; add lettuce, cucumber, greek yogurt to pita - flavor with lemon juice and black pepper  . Seafood:  o Mediterranean grilled salmon, seasoned with garlic, basil, parsley, lemon juice and black pepper o Shrimp, lemon, and spinach whole-grain pasta salad made with low fat greek yogurt  o Seared scallops with lemon orzo  o Seared tuna steaks seasoned salt, pepper, coriander topped with tomato mixture of olives, tomatoes, olive oil, minced garlic, parsley, green onions and cappers  . Meats:  o Herbed greek chicken salad with kalamata olives, cucumber, feta  o Red bell peppers stuffed with spinach, bulgur, lean ground beef (or lentils) & topped with feta   o Kebabs: skewers of chicken, tomatoes, onions, zucchini, squash  o [place] burgers: made with red onions, mint, dill, lemon juice, feta cheese topped with roasted red peppers . Vegetarian o  Cucumber salad: cucumbers, artichoke hearts, celery, red onion, feta cheese, tossed in olive oil & lemon juice  o Hummus and whole grain pita points with a greek salad  (lettuce, tomato, feta, olives, cucumbers, red onion) o Lentil soup with celery, carrots made with vegetable broth, garlic, salt and pepper  o Tabouli salad: parsley, bulgur, mint, scallions, cucumbers, tomato, radishes, lemon juice, olive oil, salt and pepper.         Neta Mends. Jahki Witham M.D.

## 2014-10-28 LAB — PTH, INTACT AND CALCIUM
CALCIUM: 9.9 mg/dL (ref 8.4–10.5)
PTH: 30 pg/mL (ref 14–64)

## 2014-11-01 ENCOUNTER — Encounter: Payer: Self-pay | Admitting: Family Medicine

## 2014-11-09 ENCOUNTER — Encounter: Payer: Self-pay | Admitting: Internal Medicine

## 2014-11-30 ENCOUNTER — Encounter: Payer: Self-pay | Admitting: Internal Medicine

## 2014-11-30 ENCOUNTER — Telehealth: Payer: Self-pay | Admitting: Nurse Practitioner

## 2014-11-30 DIAGNOSIS — R059 Cough, unspecified: Secondary | ICD-10-CM

## 2014-11-30 DIAGNOSIS — R05 Cough: Secondary | ICD-10-CM

## 2014-11-30 NOTE — Progress Notes (Signed)
We are sorry that you are not feeling well.  Here is how we plan to help!  Based on what you have shared with me it looks like you have upper respiratory tract inflammation that has resulted in a significant cough.  Inflammation and infection in the upper respiratory tract is commonly called bronchitis and has four common causes:  Allergies, Viral Infections, Acid Reflux and Bacterial Infections.  Allergies, viruses and acid reflux are treated by controlling symptoms or eliminating the cause. An example might be a cough caused by taking certain blood pressure medications. You stop the cough by changing the medication. Another example might be a cough caused by acid reflux. Controlling the reflux helps control the cough.  Based on your presentation I believe you most likely have A cough due to a virus.  This is called viral bronchitis and is best treated by rest, plenty of fluids and control of the cough.  You may use Ibuprofen or Tylenol as directed to help your symptoms.    In addition you may use A non-prescription cough medication called Robitussin DAC. Take 2 teaspoons every 8 hours or Delsym: take 2 teaspoons every 12 hours. I am sorry but cannot due rx cough meds with codeine in an e visit- will have to get that from your PCP.    HOME CARE . Only take medications as instructed by your medical team. . Complete the entire course of an antibiotic. . Drink plenty of fluids and get plenty of rest. . Avoid close contacts especially the very young and the elderly . Cover your mouth if you cough or cough into your sleeve. . Always remember to wash your hands . A steam or ultrasonic humidifier can help congestion.    GET HELP RIGHT AWAY IF: . You develop worsening fever. . You become short of breath . You cough up blood. . Your symptoms persist after you have completed your treatment plan MAKE SURE YOU   Understand these instructions.  Will watch your condition.  Will get help right away if  you are not doing well or get worse.  Your e-visit answers were reviewed by a board certified advanced clinical practitioner to complete your personal care plan.  Depending on the condition, your plan could have included both over the counter or prescription medications.  If there is a problem please reply  once you have received a response from your provider.  Your safety is important to us.  If you have drug allergies check your prescription carefully.    You can use MyChart to ask questions about today's visit, request a non-urgent call back, or ask for a work or school excuse.  You will get an e-mail in the next two days asking about your experience.  I hope that your e-visit has been valuable and will speed your recovery. Thank you for using e-visits.

## 2014-12-01 ENCOUNTER — Encounter: Payer: Self-pay | Admitting: Family Medicine

## 2014-12-01 ENCOUNTER — Ambulatory Visit (INDEPENDENT_AMBULATORY_CARE_PROVIDER_SITE_OTHER): Payer: BLUE CROSS/BLUE SHIELD | Admitting: Family Medicine

## 2014-12-01 VITALS — BP 94/70 | HR 72 | Temp 98.2°F | Ht 61.25 in | Wt 139.0 lb

## 2014-12-01 DIAGNOSIS — J209 Acute bronchitis, unspecified: Secondary | ICD-10-CM

## 2014-12-01 MED ORDER — HYDROCODONE-HOMATROPINE 5-1.5 MG/5ML PO SYRP: 5.0000 mL | ORAL_SOLUTION | ORAL | Status: DC | PRN

## 2014-12-01 MED ORDER — AZITHROMYCIN 250 MG PO TABS: ORAL_TABLET | ORAL | Status: DC

## 2014-12-01 MED ORDER — METHYLPREDNISOLONE ACETATE 80 MG/ML IJ SUSP
120.0000 mg | Freq: Once | INTRAMUSCULAR | Status: AC
  Administered 2014-12-01: 120 mg via INTRAMUSCULAR

## 2014-12-01 NOTE — Progress Notes (Signed)
   Subjective:    Patient ID: Lisa Lee, female    DOB: 08/17/1960, 54 y.o.   MRN: [id number]  HPI Here for 5 days of a ST, PND, chest tightness and coughing up yellow sputum. Using her albuterol inhaler.    Review of Systems  Constitutional: Negative.   HENT: Positive for congestion. Negative for postnasal drip and sinus pressure.   Eyes: Negative.   Respiratory: Positive for cough and chest tightness. Negative for shortness of breath and wheezing.   Cardiovascular: Negative.        Objective:   Physical Exam  Constitutional: She appears well-developed and well-nourished.  HENT:  Right Ear: External ear normal.  Left Ear: External ear normal.  Nose: Nose normal.  Mouth/Throat: Oropharynx is clear and moist.  Eyes: Conjunctivae are normal.  Pulmonary/Chest: Effort normal. She has no rales.  Scattered wheezes and rhonchi   Lymphadenopathy:    She has no cervical adenopathy.          Assessment & Plan:  Bronchitis, possibly atypical. Given a Zpack and a steroid shot.

## 2014-12-01 NOTE — Addendum Note (Signed)
Addended by: Aniceto BossNIMMONS, Sidnee Gambrill A on: 12/01/2014 03:50 PM   Modules accepted: Orders

## 2014-12-01 NOTE — Telephone Encounter (Signed)
Ok to refill the hycodan   180 cc

## 2014-12-01 NOTE — Progress Notes (Signed)
Pre visit review using our clinic review tool, if applicable. No additional management support is needed unless otherwise documented below in the visit note. 

## 2014-12-02 MED ORDER — HYDROCODONE-HOMATROPINE 5-1.5 MG/5ML PO SYRP: 5.0000 mL | ORAL_SOLUTION | ORAL | Status: DC | PRN

## 2014-12-18 ENCOUNTER — Encounter: Payer: Self-pay | Admitting: Internal Medicine

## 2014-12-20 NOTE — Telephone Encounter (Signed)
It sounds like she needs an OV to re-evaluate

## 2015-01-01 ENCOUNTER — Encounter: Payer: Self-pay | Admitting: Internal Medicine

## 2015-01-04 ENCOUNTER — Encounter: Payer: Self-pay | Admitting: Internal Medicine

## 2015-01-04 NOTE — Telephone Encounter (Signed)
Ok to do 90 days  Please let us know name of psych prescriber to putin our recordss Thanks

## 2015-01-05 MED ORDER — LITHIUM CARBONATE ER 300 MG PO TBCR: 300.0000 mg | EXTENDED_RELEASE_TABLET | Freq: Two times a day (BID) | ORAL | Status: DC

## 2015-01-10 ENCOUNTER — Other Ambulatory Visit: Payer: Self-pay | Admitting: Internal Medicine

## 2015-01-11 MED ORDER — LEVOTHYROXINE SODIUM 75 MCG PO TABS: 75.0000 ug | ORAL_TABLET | Freq: Every day | ORAL | Status: DC

## 2015-01-11 NOTE — Addendum Note (Signed)
Addended by: Raj Janus T on: 01/11/2015 08:04 AM   Modules accepted: Orders

## 2015-02-14 ENCOUNTER — Other Ambulatory Visit: Payer: Self-pay | Admitting: Gastroenterology

## 2015-02-14 LAB — HM COLONOSCOPY

## 2015-02-15 ENCOUNTER — Encounter: Payer: Self-pay | Admitting: Internal Medicine

## 2015-02-17 NOTE — Telephone Encounter (Signed)
She may want tot ry ranitidine 150 mg 1 po bid disp 60 refill x 3  (not as poserful but less likely to  Cause long term side effects)     However if wishes can re fill the protonix for 3 months and try taking every other day or so

## 2015-02-25 ENCOUNTER — Encounter: Payer: Self-pay | Admitting: Family Medicine

## 2015-04-11 ENCOUNTER — Other Ambulatory Visit: Payer: Self-pay | Admitting: Internal Medicine

## 2015-04-12 NOTE — Telephone Encounter (Signed)
Sent to the pharmacy by e-scribe.  Pt has appt on 10/28/15 for cpx.

## 2015-05-27 ENCOUNTER — Encounter: Payer: Self-pay | Admitting: Internal Medicine

## 2015-06-20 ENCOUNTER — Other Ambulatory Visit: Payer: Self-pay | Admitting: Obstetrics and Gynecology

## 2015-07-13 ENCOUNTER — Other Ambulatory Visit: Payer: Self-pay | Admitting: Obstetrics and Gynecology

## 2015-07-14 ENCOUNTER — Encounter (HOSPITAL_COMMUNITY)
Admission: RE | Admit: 2015-07-14 | Discharge: 2015-07-14 | Disposition: A | Discharge status: Home / Self Care | Payer: BLUE CROSS/BLUE SHIELD | Source: Ambulatory Visit | Attending: Obstetrics and Gynecology | Admitting: Obstetrics and Gynecology

## 2015-07-14 ENCOUNTER — Encounter (HOSPITAL_COMMUNITY): Payer: Self-pay

## 2015-07-14 DIAGNOSIS — Z01812 Encounter for preprocedural laboratory examination: Secondary | ICD-10-CM | POA: Insufficient documentation

## 2015-07-14 DIAGNOSIS — R87613 High grade squamous intraepithelial lesion on cytologic smear of cervix (HGSIL): Secondary | ICD-10-CM | POA: Insufficient documentation

## 2015-07-14 LAB — CBC
HEMATOCRIT: 41.8 % (ref 36.0–46.0)
HEMOGLOBIN: 13.9 g/dL (ref 12.0–15.0)
MCH: 29.7 pg (ref 26.0–34.0)
MCHC: 33.3 g/dL (ref 30.0–36.0)
MCV: 89.3 fL (ref 78.0–100.0)
Platelets: 301 10*3/uL (ref 150–400)
RBC: 4.68 MIL/uL (ref 3.87–5.11)
RDW: 12.2 % (ref 11.5–15.5)
WBC: 7.3 10*3/uL (ref 4.0–10.5)

## 2015-07-14 NOTE — Patient Instructions (Addendum)
Your procedure is scheduled on:07/21/15  Enter through the Main Entrance at :11:45 am Pick up desk phone and dial [phone] and inform us of your arrival.  Please call [phone] if you have any problems the morning of surgery.  Remember: Do not eat food after midnight:Wed Clear liquids are ok until:9am on Thursday   You may brush your teeth the morning of surgery.  Take these meds the morning of surgery with a sip of water: Thyroid med, Lithium, Lexapro  DO NOT wear jewelry, eye make-up, lipstick,body lotion, or dark fingernail polish.  (Polished toes are ok) You may wear deodorant.  If you are to be admitted after surgery, leave suitcase in car until your room has been assigned. Patients discharged on the day of surgery will not be allowed to drive home. Wear loose fitting, comfortable clothes for your ride home.

## 2015-07-21 ENCOUNTER — Encounter (HOSPITAL_COMMUNITY)
Admission: RE | Disposition: A | Discharge status: Home / Self Care | Payer: Self-pay | Source: Ambulatory Visit | Attending: Obstetrics and Gynecology

## 2015-07-21 ENCOUNTER — Ambulatory Visit (HOSPITAL_COMMUNITY): Payer: BLUE CROSS/BLUE SHIELD | Admitting: Anesthesiology

## 2015-07-21 ENCOUNTER — Ambulatory Visit (HOSPITAL_COMMUNITY)
Admission: RE | Admit: 2015-07-21 | Discharge: 2015-07-21 | Disposition: A | Discharge status: Home / Self Care | Payer: BLUE CROSS/BLUE SHIELD | Source: Ambulatory Visit | Attending: Obstetrics and Gynecology | Admitting: Obstetrics and Gynecology

## 2015-07-21 ENCOUNTER — Encounter (HOSPITAL_COMMUNITY): Payer: Self-pay | Admitting: Anesthesiology

## 2015-07-21 DIAGNOSIS — R87613 High grade squamous intraepithelial lesion on cytologic smear of cervix (HGSIL): Secondary | ICD-10-CM | POA: Insufficient documentation

## 2015-07-21 DIAGNOSIS — Z7982 Long term (current) use of aspirin: Secondary | ICD-10-CM | POA: Insufficient documentation

## 2015-07-21 DIAGNOSIS — Z880 Allergy status to penicillin: Secondary | ICD-10-CM | POA: Diagnosis not present

## 2015-07-21 DIAGNOSIS — K219 Gastro-esophageal reflux disease without esophagitis: Secondary | ICD-10-CM | POA: Diagnosis not present

## 2015-07-21 HISTORY — PX: CERVICAL CONIZATION W/BX: SHX1330

## 2015-07-21 SURGERY — CONE BIOPSY, CERVIX
Anesthesia: General | Site: Vagina

## 2015-07-21 MED ORDER — ONDANSETRON HCL 4 MG/2ML IJ SOLN
INTRAMUSCULAR | Status: AC
  Filled 2015-07-21: qty 2

## 2015-07-21 MED ORDER — ACETIC ACID 5 % SOLN
Status: AC
Start: 2015-07-21 — End: 2015-07-21
  Filled 2015-07-21: qty 500

## 2015-07-21 MED ORDER — LIDOCAINE HCL (CARDIAC) 20 MG/ML IV SOLN
INTRAVENOUS | Status: DC | PRN
  Administered 2015-07-21: 60 mg via INTRAVENOUS

## 2015-07-21 MED ORDER — VASOPRESSIN 20 UNIT/ML IV SOLN
INTRAVENOUS | Status: AC
  Filled 2015-07-21: qty 1

## 2015-07-21 MED ORDER — DEXAMETHASONE SODIUM PHOSPHATE 10 MG/ML IJ SOLN
INTRAMUSCULAR | Status: DC | PRN
  Administered 2015-07-21: 4 mg via INTRAVENOUS

## 2015-07-21 MED ORDER — MIDAZOLAM HCL 2 MG/2ML IJ SOLN
INTRAMUSCULAR | Status: AC
  Filled 2015-07-21: qty 2

## 2015-07-21 MED ORDER — LIDOCAINE HCL (CARDIAC) 20 MG/ML IV SOLN
INTRAVENOUS | Status: AC
  Filled 2015-07-21: qty 5

## 2015-07-21 MED ORDER — BUPIVACAINE-EPINEPHRINE 0.5% -1:200000 IJ SOLN
INTRAMUSCULAR | Status: DC | PRN
  Administered 2015-07-21 (×3): 10 mL

## 2015-07-21 MED ORDER — KETOROLAC TROMETHAMINE 30 MG/ML IJ SOLN
INTRAMUSCULAR | Status: AC
  Filled 2015-07-21: qty 1

## 2015-07-21 MED ORDER — BUPIVACAINE-EPINEPHRINE (PF) 0.5% -1:200000 IJ SOLN
INTRAMUSCULAR | Status: AC
  Filled 2015-07-21: qty 30

## 2015-07-21 MED ORDER — MIDAZOLAM HCL 2 MG/2ML IJ SOLN
INTRAMUSCULAR | Status: DC | PRN
  Administered 2015-07-21 (×2): 1 mg via INTRAVENOUS

## 2015-07-21 MED ORDER — DEXAMETHASONE SODIUM PHOSPHATE 4 MG/ML IJ SOLN
INTRAMUSCULAR | Status: AC
  Filled 2015-07-21: qty 1

## 2015-07-21 MED ORDER — FERRIC SUBSULFATE 259 MG/GM EX SOLN
CUTANEOUS | Status: AC
  Filled 2015-07-21: qty 8

## 2015-07-21 MED ORDER — EPHEDRINE 5 MG/ML INJ
INTRAVENOUS | Status: AC
  Filled 2015-07-21: qty 10

## 2015-07-21 MED ORDER — SCOPOLAMINE 1 MG/3DAYS TD PT72: 1.0000 | MEDICATED_PATCH | Freq: Once | TRANSDERMAL | Status: DC

## 2015-07-21 MED ORDER — PROPOFOL 10 MG/ML IV BOLUS
INTRAVENOUS | Status: DC | PRN
  Administered 2015-07-21: 140 mg via INTRAVENOUS

## 2015-07-21 MED ORDER — FENTANYL CITRATE (PF) 100 MCG/2ML IJ SOLN
INTRAMUSCULAR | Status: AC
  Filled 2015-07-21: qty 2

## 2015-07-21 MED ORDER — EPHEDRINE SULFATE 50 MG/ML IJ SOLN
INTRAMUSCULAR | Status: DC | PRN
  Administered 2015-07-21 (×2): 5 mg via INTRAVENOUS

## 2015-07-21 MED ORDER — LACTATED RINGERS IV SOLN
INTRAVENOUS | Status: DC
  Administered 2015-07-21 (×2): via INTRAVENOUS

## 2015-07-21 MED ORDER — HYDROCODONE-ACETAMINOPHEN 5-300 MG PO TABS: 1.0000 | ORAL_TABLET | ORAL | Status: DC | PRN

## 2015-07-21 MED ORDER — IODINE STRONG (LUGOLS) 5 % PO SOLN
ORAL | Status: DC | PRN
  Administered 2015-07-21: 0.1 mL

## 2015-07-21 MED ORDER — KETOROLAC TROMETHAMINE 30 MG/ML IJ SOLN
INTRAMUSCULAR | Status: DC | PRN
  Administered 2015-07-21: 30 mg via INTRAMUSCULAR
  Administered 2015-07-21: 30 mg via INTRAVENOUS

## 2015-07-21 MED ORDER — FENTANYL CITRATE (PF) 100 MCG/2ML IJ SOLN
INTRAMUSCULAR | Status: DC | PRN
  Administered 2015-07-21 (×2): 50 ug via INTRAVENOUS

## 2015-07-21 MED ORDER — ONDANSETRON HCL 4 MG/2ML IJ SOLN
INTRAMUSCULAR | Status: DC | PRN
  Administered 2015-07-21: 4 mg via INTRAVENOUS

## 2015-07-21 MED ORDER — PROPOFOL 10 MG/ML IV BOLUS
INTRAVENOUS | Status: AC
  Filled 2015-07-21: qty 20

## 2015-07-21 MED ORDER — IODINE STRONG (LUGOLS) 5 % PO SOLN
ORAL | Status: AC
  Filled 2015-07-21: qty 1

## 2015-07-21 SURGICAL SUPPLY — 26 items
APPLICATOR COTTON TIP 6IN STRL (MISCELLANEOUS) ×2 IMPLANT
BLADE SURG 11 STRL SS (BLADE) ×2 IMPLANT
CLOTH BEACON ORANGE TIMEOUT ST (SAFETY) ×2 IMPLANT
CONTAINER PREFILL 10% NBF 60ML (FORM) ×2 IMPLANT
COUNTER NEEDLE 1200 MAGNETIC (NEEDLE) ×2 IMPLANT
ELECT REM PT RETURN 9FT ADLT (ELECTROSURGICAL)
ELECTRODE REM PT RTRN 9FT ADLT (ELECTROSURGICAL) IMPLANT
GLOVE BIOGEL PI IND STRL 7.0 (GLOVE) ×1 IMPLANT
GLOVE BIOGEL PI IND STRL 8.5 (GLOVE) ×1 IMPLANT
GLOVE BIOGEL PI INDICATOR 7.0 (GLOVE) ×1
GLOVE BIOGEL PI INDICATOR 8.5 (GLOVE) ×1
GLOVE ECLIPSE 8.0 STRL XLNG CF (GLOVE) ×4 IMPLANT
GOWN STRL REUS W/TWL LRG LVL3 (GOWN DISPOSABLE) ×4 IMPLANT
NS IRRIG 1000ML POUR BTL (IV SOLUTION) ×2 IMPLANT
PACK VAGINAL MINOR WOMEN LF (CUSTOM PROCEDURE TRAY) ×2 IMPLANT
PAD OB MATERNITY 4.3X12.25 (PERSONAL CARE ITEMS) ×2 IMPLANT
PENCIL BUTTON HOLSTER BLD 10FT (ELECTRODE) IMPLANT
SCOPETTES 8  STERILE (MISCELLANEOUS) ×2
SCOPETTES 8 STERILE (MISCELLANEOUS) ×2 IMPLANT
SPONGE SURGIFOAM ABS GEL 12-7 (HEMOSTASIS) ×2 IMPLANT
SUT VICRYL 0 UR6 27IN ABS (SUTURE) ×4 IMPLANT
SYR TB 1ML 25GX5/8 (SYRINGE) IMPLANT
TOWEL OR 17X24 6PK STRL BLUE (TOWEL DISPOSABLE) ×4 IMPLANT
TUBING NON-CON 1/4 X 20 CONN (TUBING) IMPLANT
WATER STERILE IRR 1000ML POUR (IV SOLUTION) ×2 IMPLANT
YANKAUER SUCT BULB TIP NO VENT (SUCTIONS) IMPLANT

## 2015-07-21 NOTE — Discharge Instructions (Signed)
Conization of the Cervix, Care After Refer to this sheet in the next few weeks. These instructions provide you with information on caring for yourself after your procedure. Your health care provider may also give you more specific instructions. Your treatment has been planned according to current medical practices but problems sometimes occur. Call your health care provider if you have any problems or questions after your procedure. WHAT TO EXPECT AFTER THE PROCEDURE After your procedure, it is typical to have the following sensations:  If you had a general anesthetic, you may be groggy for 2-3 hours after the procedure.  You may have cramps (similar to menstrual cramps) for about 1 week.   You may have a bloody discharge or light to moderate bleeding for 1-2 weeks. The bleeding should not be heavy (for example, it should not soak 1 pad in less than 1 hour).  You may have a black vaginal discharge that looks similar to coffee grounds. This is from the paste that was applied to the cervix to control bleeding. This is normal. Recovery may take up to 3 weeks.  HOME CARE INSTRUCTIONS   Arrange for someone to drive you home after the procedure.  Only take medicines as directed by your health care provider. Do not take aspirin. It can cause bleeding.   Take showers for the first week. Do not take baths, swim, or use hot tubs until your health care provider says it is okay.   Do not douche, use tampons, or have sexual intercourse until your health care provider says it is okay.   Avoid strenuous activities, exercises, and heavy lifting for at least 7-14 days.  You may resume your normal diet unless your health care provider advises you differently.    If you are constipated, you may:  Take a mild laxative as directed by your health care provider.   Add fruit and bran to your diet.   Make sure to drink enough fluids to keep your urine clear or pale yellow.  Keep follow-up  appointments with your health care provider. SEEK MEDICAL CARE IF:   You develop a rash.   You are dizzy or lightheaded.   You feel nauseous.   You develop a bad smelling vaginal discharge. SEEK IMMEDIATE MEDICAL CARE IF:   You have blood clots or bleeding that is heavier than a normal menstrual period (for example, soaking a pad in less than 1 hour) or you develop bright red bleeding.   You have a fever over 101F (38.3C) or persistent symptoms for more than 2-3 days.   You have a fever over 101F (38.3C) and your symptoms suddenly get worse.  You have increasing cramps.   You faint.   You have pain when urinating.  You have bloody urine.   You start vomiting.   Your pain is not relieved with your medicine.   Your have severe or worsening pain. MAKE SURE YOU:  Understand these instructions.  Will watch your condition.  Will get help right away if you are not doing well or get worse.   This information is not intended to replace advice given to you by your health care provider. Make sure you discuss any questions you have with your health care provider.   Document Released: 07/16/2005 Document Revised: 07/21/2013 Document Reviewed: 01/09/2013 Elsevier Interactive Patient Education 2016 [employer].  Post Anesthesia Home Care Instructions  Activity: Get plenty of rest for the remainder of the day. A responsible adult should stay with you for  24 hours following the procedure.  For the next 24 hours, DO NOT: -Drive a car -Advertising copywriterperate machinery -Drink alcoholic beverages -Take any medication unless instructed by your physician -Make any legal decisions or sign important papers.  Meals: Start with liquid foods such as gelatin or soup. Progress to regular foods as tolerated. Avoid greasy, spicy, heavy foods. If nausea and/or vomiting occur, drink only clear liquids until the nausea and/or vomiting subsides. Call your physician if vomiting  continues.  Special Instructions/Symptoms: Your throat may feel dry or sore from the anesthesia or the breathing tube placed in your throat during surgery. If this causes discomfort, gargle with warm salt water. The discomfort should disappear within 24 hours.  If you had a scopolamine patch placed behind your ear for the management of post- operative nausea and/or vomiting:  1. The medication in the patch is effective for 72 hours, after which it should be removed.  Wrap patch in a tissue and discard in the trash. Wash hands thoroughly with soap and water. 2. You may remove the patch earlier than 72 hours if you experience unpleasant side effects which may include dry mouth, dizziness or visual disturbances. 3. Avoid touching the patch. Wash your hands with soap and water after contact with the patch.

## 2015-07-21 NOTE — Op Note (Signed)
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 Conization of the cervix has been recommended because an abnormal lesion could not be found on colposcopy. The patient understands the indications for her surgical procedure as well as her alternative treatment options. She understands and accepts the risk of anesthetic complications, bleeding, infections, and possible damage to the surrounding organs.  Findings:  The uterus is small but appropriate for the patient's age. No adnexal masses can be appreciated. No parametrial disease is appreciated. The cervix is slightly stenotic. There were no nonstaining areas when Lugol's solution was applied to the cervix.  Procedure:  The patient was taken to the operating room where an anesthetic was given through an LMA. The patient's perineum and vagina were prepped with Betadine. The bladder was drained of urine. The patient was sterilely draped. An examination under anesthesia was performed. A paracervical block was placed using 10 cc of half percent Marcaine with epinephrine. Angle sutures were placed at the 6:00 and 9:00 positions. The cervix was sounded. An additional 10 cc of half percent Marcaine with epinephrine were injected directly into the cervix. A circumferential incision was made around the endocervical os. The incision was extended in a conelike fashion to the endocervix. The specimen was removed, and a stitch was placed at the 12:00 position. The defect in the cervix was closed using inverting  sutures at the 3:00, 6:00, 9:00, and 12:00 positions. Hemostasis was adequate. An additional 10 cc of half percent Marcaine with epinephrine were injected directly into the cervix. Gelfoam was placed in the endocervix. The patient tolerated her procedure well. She was awakened without difficulty. She was transported to the recovery room in stable condition. The cervical conization specimen was sent to pathology. The estimated blood loss for the procedure was 5 cc. 0 Vicryl was the suture material used throughout the case.  Followup instructions:  The patient will return to see Dr. Stefano GaulStringer in 2 weeks. She was given a copy of the postoperative instructions for patients who've had a conization of the cervix.  Discharge medications:  Vicodin one or 2 tablets every 6 hours as needed for severe pain. Motrin 400 mg every 6 hours as needed for mild pain.  Leonard SchwartzArthur Vernon Keily Lepp, M.D.  07/21/2015

## 2015-07-21 NOTE — Transfer of Care (Signed)
Immediate Anesthesia Transfer of Care Note  Patient: Lisa Lee  Procedure(s) Performed: Procedure(s): CONIZATION CERVIX WITH BIOPSY (N/A)  Patient Location: PACU  Anesthesia Type:General  Level of Consciousness: awake, alert , oriented and patient cooperative  Airway & Oxygen Therapy: Patient Spontanous Breathing and Patient connected to nasal cannula oxygen  Post-op Assessment: Report given to RN and Post -op Vital signs reviewed and stable  Post vital signs: Reviewed and stable  Last Vitals:  Filed Vitals:   07/21/15 1148  BP: 145/86  Pulse: 64  Temp: 36.8 C  Resp: 20    Complications: No apparent anesthesia complications

## 2015-07-21 NOTE — Anesthesia Postprocedure Evaluation (Signed)
Anesthesia Post Note  Patient: Lisa Lee  Procedure(s) Performed: Procedure(s) (LRB): CONIZATION CERVIX WITH BIOPSY (N/A)  Patient location during evaluation: PACU Anesthesia Type: General Level of consciousness: awake and alert Pain management: pain level controlled Vital Signs Assessment: post-procedure vital signs reviewed and stable Respiratory status: spontaneous breathing, nonlabored ventilation, respiratory function stable and patient connected to nasal cannula oxygen Cardiovascular status: blood pressure returned to baseline and stable Postop Assessment: no signs of nausea or vomiting Anesthetic complications: no    Last Vitals:  Filed Vitals:   07/21/15 1430 07/21/15 1445  BP: 117/66 124/74  Pulse: 82 88  Temp: 37.1 C   Resp: 11 19    Last Pain: There were no vitals filed for this visit.               Shelton SilvasKevin D Emely Fahy

## 2015-07-21 NOTE — Anesthesia Preprocedure Evaluation (Addendum)
Anesthesia Evaluation  Patient identified by MRN, date of birth, ID band Patient awake    Reviewed: Allergy & Precautions, NPO status , Patient's Chart, lab work & pertinent test results  Airway Mallampati: I  TM Distance: >3 FB Neck ROM: Full    Dental  (+) Teeth Intact   Pulmonary asthma ,  Albuterol   breath sounds clear to auscultation       Cardiovascular negative cardio ROS   Rhythm:Regular Rate:Normal     Neuro/Psych PSYCHIATRIC DISORDERS Depression negative neurological ROS     GI/Hepatic Neg liver ROS, GERD  Medicated,  Endo/Other  Hypothyroidism   Renal/GU negative Renal ROS  negative genitourinary   Musculoskeletal negative musculoskeletal ROS (+)   Abdominal   Peds negative pediatric ROS (+)  Hematology negative hematology ROS (+)   Anesthesia Other Findings   Reproductive/Obstetrics negative OB ROS                           Lab Results  Component Value Date   WBC 7.3 07/14/2015   HGB 13.9 07/14/2015   HCT 41.8 07/14/2015   MCV 89.3 07/14/2015   PLT 301 07/14/2015   Lab Results  Component Value Date   CREATININE 0.80 05/11/2014   BUN 20 05/11/2014   NA 140 05/11/2014   K 4.0 05/11/2014   CL 103 05/11/2014   CO2 23 05/11/2014   No results found for: INR, PROTIME  04/2014: EKG: normal sinus rhythm.  Anesthesia Physical Anesthesia Plan  ASA: III  Anesthesia Plan: General   Post-op Pain Management:    Induction: Intravenous  Airway Management Planned: LMA  Additional Equipment:   Intra-op Plan:   Post-operative Plan: Extubation in OR  Informed Consent: I have reviewed the patients History and Physical, chart, labs and discussed the procedure including the risks, benefits and alternatives for the proposed anesthesia with the patient or authorized representative who has indicated his/her understanding and acceptance.   Dental advisory given  Plan  Discussed with: CRNA  Anesthesia Plan Comments:         Anesthesia Quick Evaluation

## 2015-07-21 NOTE — Anesthesia Procedure Notes (Signed)
Procedure Name: LMA Insertion Date/Time: 07/21/2015 12:33 PM Performed by: Yolonda KidaARVER, Maelee Hoot L Pre-anesthesia Checklist: Patient identified, Emergency Drugs available, Suction available and Patient being monitored Patient Re-evaluated:Patient Re-evaluated prior to inductionOxygen Delivery Method: Circle system utilized Preoxygenation: Pre-oxygenation with 100% oxygen Intubation Type: IV induction Ventilation: Mask ventilation without difficulty LMA: LMA inserted LMA Size: 4.0 Number of attempts: 1 Placement Confirmation: CO2 detector,  positive ETCO2 and breath sounds checked- equal and bilateral Tube secured with: Tape Dental Injury: Teeth and Oropharynx as per pre-operative assessment

## 2015-07-21 NOTE — H&P (Signed)
Admission History and Physical Exam for a Gynecology Patient  [name] is a 54 y.o. female who presents for a conization of the cervix for an unexplained HGSIL. She has been followed at the [facility] and Gynecology division of [facility].  OB History    Gravida Para Term Preterm AB TAB SAB Ectopic Multiple Living   6         2      Past Medical History  Diagnosis Date  . Asthma   . Depression   . Allergic rhinitis   . Hypothyroidism   . 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 Hemorrhoids   . Diarrhea 08/09/2011    Poss ibs  Vs other  Related to stress  consdier other eval if needed. Had colonoscoopy per dr Matthias Hughs   . Medication side effect 11/01/2011    tussionex   distrubed sleep     Prescriptions prior to admission  Medication Sig Dispense Refill Last Dose  . escitalopram (LEXAPRO) 20 MG tablet Take 1 tablet (20 mg total) by mouth daily. 30 tablet 5 07/21/2015 at 0830  . levothyroxine (SYNTHROID, LEVOTHROID) 75 MCG tablet Take 75 mcg by mouth daily before breakfast.   07/21/2015 at 0700  . lithium carbonate (LITHOBID) 300 MG CR tablet Take 1 tablet (300 mg total) by mouth 2 (two) times daily. 180 tablet 0 07/21/2015 at 0830  . lithium carbonate (LITHOBID) 300 MG CR tablet Take 1 tablet (300 mg total) by mouth 2 (two) times daily. 180 tablet 0 07/21/2015 at 0830  . ranitidine (ZANTAC) 150 MG tablet Take 150 mg by mouth 2 (two) times daily.     Marland Kitchen albuterol (PROAIR HFA) 108 (90 BASE) MCG/ACT inhaler Inhale 2 puffs into the lungs every 6 (six) hours as needed for wheezing. 3 Inhaler 0 Taking  . aspirin EC 81 MG tablet Take 1 tablet (81 mg total) by mouth daily.   Taking  . Cholecalciferol (VITAMIN D) 2000 UNITS tablet Take 2,000 Units by mouth daily.   Taking  . Doxylamine Succinate, Sleep, (UNISOM PO) Take 0.5 tablets by mouth at bedtime as needed (for sleep).    Taking  . Estradiol (VAGIFEM) 10 MCG TABS vaginal tablet Place 1 each vaginally 2  (two) times a week.    Taking  . ibuprofen (ADVIL,MOTRIN) 200 MG tablet Take 400 mg by mouth every 6 (six) hours as needed for headache or mild pain.    Taking  . mometasone (ASMANEX) 220 MCG/INH inhaler Inhale 2 puffs into the lungs daily.   Taking    Past Surgical History  Procedure Laterality Date  . Hysteroscopy      uterine polypectomy Jan 7th  . Tonsillectomy    . Cervical cryotherapy  1984  . Lymph node biopsy    . Dilation and curettage, diagnostic / therapeutic  1994    Blighted Ovum  . Hysteroscopy with resectoscope  08/04/2009    Removed polyp & IUD  . Colposcopy  08/26/13    Allergies  Allergen Reactions  . Penicillins Hives and Shortness Of Breath    Has patient had a PCN reaction causing immediate rash, facial/tongue/throat swelling, SOB or lightheadedness with hypotension: Yes Has patient had a PCN reaction causing severe rash involving mucus membranes or skin necrosis: No Has patient had a PCN reaction that required hospitalization No Has patient had a PCN reaction occurring within the last 10 years: No If all of the above answers are "NO", then may proceed with  Cephalosporin use.     Family History: family history includes Arthritis in her mother; Colon cancer in her father; Heart disease in her father, mother, and sister; Heart disease (age of onset: 4657) in her sister; Parkinson's disease in her father; Pulmonary embolism in her daughter; Thyroid disease in her sister.  Social History:  reports that she has never smoked. She has never used smokeless tobacco. She reports that she drinks alcohol. She reports that she does not use illicit drugs.  Review of systems: See HPI.  Admission Physical Exam:  Blood pressure 145/86, pulse 64, temperature 98.3 F (36.8 C), temperature source Oral, resp. rate 20, SpO2 98 %.  HEENT:                 Within normal limits Chest:                   Clear Heart:                    Regular rate and rhythm Breasts:                 No masses, skin changes, bleeding, or discharge present Abdomen:             Nontender, no masses Extremities:          Grossly normal Neurologic exam: Grossly normal  Pelvic exam:  External genitalia: normal general appearance Vaginal: normal without tenderness, induration or masses Cervix: normal appearance Adnexa: normal bimanual exam Uterus: normal single, nontender  Assessment:  HGSIL on Pap  Plan:  Conization of the cervix.   Lisa LimboSTRINGER,Lisa Lee 07/21/2015

## 2015-07-22 ENCOUNTER — Encounter (HOSPITAL_COMMUNITY): Payer: Self-pay | Admitting: Obstetrics and Gynecology

## 2015-07-27 ENCOUNTER — Telehealth: Payer: Self-pay | Admitting: Internal Medicine

## 2015-07-27 MED ORDER — LEVOTHYROXINE SODIUM 75 MCG PO TABS: 75.0000 ug | ORAL_TABLET | Freq: Every day | ORAL | Status: DC

## 2015-07-27 NOTE — Telephone Encounter (Signed)
Sent to the pharmacy by e-scribe. 

## 2015-07-27 NOTE — Telephone Encounter (Signed)
Patient is calling requesting a 10 day supply of levothyroxine (SYNTHROID, LEVOTHROID) 75 MCG tablet. Until her express script arrives  Pharmacy: [facility]

## 2015-10-18 ENCOUNTER — Other Ambulatory Visit: Payer: Self-pay | Admitting: Family Medicine

## 2015-10-18 DIAGNOSIS — Z0000 Encounter for general adult medical examination without abnormal findings: Secondary | ICD-10-CM

## 2015-10-19 ENCOUNTER — Other Ambulatory Visit (INDEPENDENT_AMBULATORY_CARE_PROVIDER_SITE_OTHER): Payer: BLUE CROSS/BLUE SHIELD

## 2015-10-19 DIAGNOSIS — Z0000 Encounter for general adult medical examination without abnormal findings: Secondary | ICD-10-CM | POA: Diagnosis not present

## 2015-10-19 LAB — BASIC METABOLIC PANEL
BUN: 21 mg/dL (ref 6–23)
CHLORIDE: 106 meq/L (ref 96–112)
CO2: 27 mEq/L (ref 19–32)
Calcium: 10.1 mg/dL (ref 8.4–10.5)
Creatinine, Ser: 0.87 mg/dL (ref 0.40–1.20)
GFR: 71.59 mL/min (ref >60.00)
GLUCOSE: 98 mg/dL (ref 70–99)
POTASSIUM: 4.4 meq/L (ref 3.5–5.1)
SODIUM: 139 meq/L (ref 135–145)

## 2015-10-19 LAB — LIPID PANEL
CHOL/HDL RATIO: 5
CHOLESTEROL: 247 mg/dL — AB (ref 0–200)
HDL: 51.2 mg/dL (ref >39.00)
LDL CALC: 162 mg/dL — AB (ref 0–99)
NonHDL: 195.85
TRIGLYCERIDES: 171 mg/dL — AB (ref 0.0–149.0)
VLDL: 34.2 mg/dL (ref 0.0–40.0)

## 2015-10-19 LAB — CBC WITH DIFFERENTIAL/PLATELET
BASOS PCT: 0.6 % (ref 0.0–3.0)
Basophils Absolute: 0.1 10*3/uL (ref 0.0–0.1)
EOS PCT: 5.5 % — AB (ref 0.0–5.0)
Eosinophils Absolute: 0.5 10*3/uL (ref 0.0–0.7)
HCT: 42.5 % (ref 36.0–46.0)
HEMOGLOBIN: 14.1 g/dL (ref 12.0–15.0)
LYMPHS ABS: 2.5 10*3/uL (ref 0.7–4.0)
Lymphocytes Relative: 29 % (ref 12.0–46.0)
MCHC: 33.1 g/dL (ref 30.0–36.0)
MCV: 89 fl (ref 78.0–100.0)
MONO ABS: 0.6 10*3/uL (ref 0.1–1.0)
MONOS PCT: 7.6 % (ref 3.0–12.0)
Neutro Abs: 4.9 10*3/uL (ref 1.4–7.7)
Neutrophils Relative %: 57.3 % (ref 43.0–77.0)
Platelets: 299 10*3/uL (ref 150.0–400.0)
RBC: 4.77 Mil/uL (ref 3.87–5.11)
RDW: 13 % (ref 11.5–15.5)
WBC: 8.5 10*3/uL (ref 4.0–10.5)

## 2015-10-19 LAB — HEPATIC FUNCTION PANEL
ALBUMIN: 4.5 g/dL (ref 3.5–5.2)
ALT: 20 U/L (ref 0–35)
AST: 17 U/L (ref 0–37)
Alkaline Phosphatase: 58 U/L (ref 39–117)
Bilirubin, Direct: 0.1 mg/dL (ref 0.0–0.3)
TOTAL PROTEIN: 7.1 g/dL (ref 6.0–8.3)
Total Bilirubin: 0.5 mg/dL (ref 0.2–1.2)

## 2015-10-19 LAB — TSH: TSH: 1.2 u[IU]/mL (ref 0.35–4.50)

## 2015-10-28 ENCOUNTER — Ambulatory Visit (INDEPENDENT_AMBULATORY_CARE_PROVIDER_SITE_OTHER): Payer: BLUE CROSS/BLUE SHIELD | Admitting: Internal Medicine

## 2015-10-28 ENCOUNTER — Encounter: Payer: Self-pay | Admitting: Internal Medicine

## 2015-10-28 VITALS — BP 114/72 | HR 70 | Temp 97.7°F | Resp 12 | Ht 61.0 in | Wt 131.2 lb

## 2015-10-28 DIAGNOSIS — Z0000 Encounter for general adult medical examination without abnormal findings: Secondary | ICD-10-CM

## 2015-10-28 DIAGNOSIS — E039 Hypothyroidism, unspecified: Secondary | ICD-10-CM | POA: Diagnosis not present

## 2015-10-28 DIAGNOSIS — Z8249 Family history of ischemic heart disease and other diseases of the circulatory system: Secondary | ICD-10-CM | POA: Diagnosis not present

## 2015-10-28 DIAGNOSIS — E785 Hyperlipidemia, unspecified: Secondary | ICD-10-CM | POA: Diagnosis not present

## 2015-10-28 DIAGNOSIS — Z79899 Other long term (current) drug therapy: Secondary | ICD-10-CM

## 2015-10-28 MED ORDER — LEVOTHYROXINE SODIUM 75 MCG PO TABS: 75.0000 ug | ORAL_TABLET | Freq: Every day | ORAL | Status: DC

## 2015-10-28 NOTE — Patient Instructions (Addendum)
Continue lifestyle intervention healthy eating and exercise . Lipids goal to improve  Can do a referral  To lipid clininc Low risk per asacvd 2013 guidelines but you have a strong family hx .    Health Maintenance, Female Adopting a healthy lifestyle and getting preventive care can go a long way to promote health and wellness. Talk with your health care provider about what schedule of regular examinations is right for you. This is a good chance for you to check in with your provider about disease prevention and staying healthy. In between checkups, there are plenty of things you can do on your own. Experts have done a lot of research about which lifestyle changes and preventive measures are most likely to keep you healthy. Ask your health care provider for more information. WEIGHT AND DIET  Eat a healthy diet  Be sure to include plenty of vegetables, fruits, low-fat dairy products, and lean protein.  Do not eat a lot of foods high in solid fats, added sugars, or salt.  Get regular exercise. This is one of the most important things you can do for your health.  Most adults should exercise for at least 150 minutes each week. The exercise should increase your heart rate and make you sweat (moderate-intensity exercise).  Most adults should also do strengthening exercises at least twice a week. This is in addition to the moderate-intensity exercise.  Maintain a healthy weight  Body mass index (BMI) is a measurement that can be used to identify possible weight problems. It estimates body fat based on height and weight. Your health care provider can help determine your BMI and help you achieve or maintain a healthy weight.  For females 16 years of age and older:   A BMI below 18.5 is considered underweight.  A BMI of 18.5 to 24.9 is normal.  A BMI of 25 to 29.9 is considered overweight.  A BMI of 30 and above is considered obese.  Watch levels of cholesterol and blood lipids  You should  start having your blood tested for lipids and cholesterol at 56 years of age, then have this test every 5 years.  You may need to have your cholesterol levels checked more often if:  Your lipid or cholesterol levels are high.  You are older than 56 years of age.  You are at high risk for heart disease.  CANCER SCREENING   Lung Cancer  Lung cancer screening is recommended for adults 36-86 years old who are at high risk for lung cancer because of a history of smoking.  A yearly low-dose CT scan of the lungs is recommended for people who:  Currently smoke.  Have quit within the past 15 years.  Have at least a 30-pack-year history of smoking. A pack year is smoking an average of one pack of cigarettes a day for 1 year.  Yearly screening should continue until it has been 15 years since you quit.  Yearly screening should stop if you develop a health problem that would prevent you from having lung cancer treatment.  Breast Cancer  Practice breast self-awareness. This means understanding how your breasts normally appear and feel.  It also means doing regular breast self-exams. Let your health care provider know about any changes, no matter how small.  If you are in your 20s or 30s, you should have a clinical breast exam (CBE) by a health care provider every 1-3 years as part of a regular health exam.  If you are 40  or older, have a CBE every year. Also consider having a breast X-ray (mammogram) every year.  If you have a family history of breast cancer, talk to your health care provider about genetic screening.  If you are at high risk for breast cancer, talk to your health care provider about having an MRI and a mammogram every year.  Breast cancer gene (BRCA) assessment is recommended for women who have family members with BRCA-related cancers. BRCA-related cancers include:  Breast.  Ovarian.  Tubal.  Peritoneal cancers.  Results of the assessment will determine the  need for genetic counseling and BRCA1 and BRCA2 testing. Cervical Cancer Your health care provider may recommend that you be screened regularly for cancer of the pelvic organs (ovaries, uterus, and vagina). This screening involves a pelvic examination, including checking for microscopic changes to the surface of your cervix (Pap test). You may be encouraged to have this screening done every 3 years, beginning at age 21.  For women ages 30-65, health care providers may recommend pelvic exams and Pap testing every 3 years, or they may recommend the Pap and pelvic exam, combined with testing for human papilloma virus (HPV), every 5 years. Some types of HPV increase your risk of cervical cancer. Testing for HPV may also be done on women of any age with unclear Pap test results.  Other health care providers may not recommend any screening for nonpregnant women who are considered low risk for pelvic cancer and who do not have symptoms. Ask your health care provider if a screening pelvic exam is right for you.  If you have had past treatment for cervical cancer or a condition that could lead to cancer, you need Pap tests and screening for cancer for at least 20 years after your treatment. If Pap tests have been discontinued, your risk factors (such as having a new sexual partner) need to be reassessed to determine if screening should resume. Some women have medical problems that increase the chance of getting cervical cancer. In these cases, your health care provider may recommend more frequent screening and Pap tests. Colorectal Cancer  This type of cancer can be detected and often prevented.  Routine colorectal cancer screening usually begins at 56 years of age and continues through 56 years of age.  Your health care provider may recommend screening at an earlier age if you have risk factors for colon cancer.  Your health care provider may also recommend using home test kits to check for hidden blood in  the stool.  A small camera at the end of a tube can be used to examine your colon directly (sigmoidoscopy or colonoscopy). This is done to check for the earliest forms of colorectal cancer.  Routine screening usually begins at age 50.  Direct examination of the colon should be repeated every 5-10 years through 56 years of age. However, you may need to be screened more often if early forms of precancerous polyps or small growths are found. Skin Cancer  Check your skin from head to toe regularly.  Tell your health care provider about any new moles or changes in moles, especially if there is a change in a mole's shape or color.  Also tell your health care provider if you have a mole that is larger than the size of a pencil eraser.  Always use sunscreen. Apply sunscreen liberally and repeatedly throughout the day.  Protect yourself by wearing long sleeves, pants, a wide-brimmed hat, and sunglasses whenever you are outside. HEART DISEASE,   DIABETES, AND HIGH BLOOD PRESSURE   High blood pressure causes heart disease and increases the risk of stroke. High blood pressure is more likely to develop in:  People who have blood pressure in the high end of the normal range (130-139/85-89 mm Hg).  People who are overweight or obese.  People who are African American.  If you are 18-39 years of age, have your blood pressure checked every 3-5 years. If you are 40 years of age or older, have your blood pressure checked every year. You should have your blood pressure measured twice--once when you are at a hospital or clinic, and once when you are not at a hospital or clinic. Record the average of the two measurements. To check your blood pressure when you are not at a hospital or clinic, you can use:  An automated blood pressure machine at a pharmacy.  A home blood pressure monitor.  If you are between 55 years and 79 years old, ask your health care provider if you should take aspirin to prevent  strokes.  Have regular diabetes screenings. This involves taking a blood sample to check your fasting blood sugar level.  If you are at a normal weight and have a low risk for diabetes, have this test once every three years after 56 years of age.  If you are overweight and have a high risk for diabetes, consider being tested at a younger age or more often. PREVENTING INFECTION  Hepatitis B  If you have a higher risk for hepatitis B, you should be screened for this virus. You are considered at high risk for hepatitis B if:  You were born in a country where hepatitis B is common. Ask your health care provider which countries are considered high risk.  Your parents were born in a high-risk country, and you have not been immunized against hepatitis B (hepatitis B vaccine).  You have HIV or AIDS.  You use needles to inject street drugs.  You live with someone who has hepatitis B.  You have had sex with someone who has hepatitis B.  You get hemodialysis treatment.  You take certain medicines for conditions, including cancer, organ transplantation, and autoimmune conditions. Hepatitis C  Blood testing is recommended for:  Everyone born from 1945 through 1965.  Anyone with known risk factors for hepatitis C. Sexually transmitted infections (STIs)  You should be screened for sexually transmitted infections (STIs) including gonorrhea and chlamydia if:  You are sexually active and are younger than 56 years of age.  You are older than 56 years of age and your health care provider tells you that you are at risk for this type of infection.  Your sexual activity has changed since you were last screened and you are at an increased risk for chlamydia or gonorrhea. Ask your health care provider if you are at risk.  If you do not have HIV, but are at risk, it may be recommended that you take a prescription medicine daily to prevent HIV infection. This is called pre-exposure prophylaxis  (PrEP). You are considered at risk if:  You are sexually active and do not regularly use condoms or know the HIV status of your partner(s).  You take drugs by injection.  You are sexually active with a partner who has HIV. Talk with your health care provider about whether you are at high risk of being infected with HIV. If you choose to begin PrEP, you should first be tested for HIV. You should then   be tested every 3 months for as long as you are taking PrEP.  PREGNANCY   If you are premenopausal and you may become pregnant, ask your health care provider about preconception counseling.  If you may become pregnant, take 400 to 800 micrograms (mcg) of folic acid every day.  If you want to prevent pregnancy, talk to your health care provider about birth control (contraception). OSTEOPOROSIS AND MENOPAUSE   Osteoporosis is a disease in which the bones lose minerals and strength with aging. This can result in serious bone fractures. Your risk for osteoporosis can be identified using a bone density scan.  If you are 70 years of age or older, or if you are at risk for osteoporosis and fractures, ask your health care provider if you should be screened.  Ask your health care provider whether you should take a calcium or vitamin D supplement to lower your risk for osteoporosis.  Menopause may have certain physical symptoms and risks.  Hormone replacement therapy may reduce some of these symptoms and risks. Talk to your health care provider about whether hormone replacement therapy is right for you.  HOME CARE INSTRUCTIONS   Schedule regular health, dental, and eye exams.  Stay current with your immunizations.   Do not use any tobacco products including cigarettes, chewing tobacco, or electronic cigarettes.  If you are pregnant, do not drink alcohol.  If you are breastfeeding, limit how much and how often you drink alcohol.  Limit alcohol intake to no more than 1 drink per day for  nonpregnant women. One drink equals 12 ounces of beer, 5 ounces of wine, or 1 ounces of hard liquor.  Do not use street drugs.  Do not share needles.  Ask your health care provider for help if you need support or information about quitting drugs.  Tell your health care provider if you often feel depressed.  Tell your health care provider if you have ever been abused or do not feel safe at home.   This information is not intended to replace advice given to you by your health care provider. Make sure you discuss any questions you have with your health care provider.   Document Released: 01/29/2011 Document Revised: 08/06/2014 Document Reviewed: 06/17/2013 Elsevier Interactive Patient Education [employer].

## 2015-10-28 NOTE — Progress Notes (Signed)
Chief Complaint  Patient presents with  . Annual Exam  . Hypothyroidism    HPI: Patient  Lisa Lee  55 y.o. comes in today for [place] visit  GYNE had conization for hpv following doing ok  Trying off   asmanex  For asthma seems ok  [address]   Is [facility] prescriber Now married and changed jobs doing well Thyroid med  Was send generic   And seems ok .  Has been working on  lsi for lipids  And has lost weight in process   Health Maintenance  Topic Date Due  . Hepatitis C Screening  [birth year]  . HIV Screening  10/30/1974  . INFLUENZA VACCINE  02/28/2016  . MAMMOGRAM  07/02/2016  . PAP SMEAR  06/05/2018  . TETANUS/TDAP  02/21/2021  . COLONOSCOPY  02/13/2025   Health Maintenance Review LIFESTYLE:  Exercise:   None  Work 40 +  Back to home  Care.  Married in November   [place] . Tobacco/ETS:   no Alcohol:  Very little  Sugar beverages: none Sleep: about  8- 10  Drug use: no  ROS:  GEN/ HEENT: No fever, significant weight changes sweats headaches vision problems hearing changes, CV/ PULM; No chest pain shortness of breath cough, syncope,edema  change in exercise tolerance. GI /GU: No adominal pain, vomiting, change in bowel habits. No blood in the stool. No significant GU symptoms. SKIN/HEME: ,no acute skin rashes suspicious lesions or bleeding. No lymphadenopathy, nodules, masses.  NEURO/ PSYCH:  No neurologic signs such as weakness numbness. No depression anxiety. IMM/ Allergy: No unusual infections.  Allergy .   REST of 12 system review negative except as per HPI   Past Medical History  Diagnosis Date  . Asthma   . Depression   . Allergic rhinitis   . Hypothyroidism   . 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 Hemorrhoids   . Diarrhea 08/09/2011    Poss ibs  Vs other  Related to stress  consdier other eval if needed. Had colonoscoopy per dr Cristina Gong   . Medication side effect 11/01/2011    tussionex   distrubed sleep     Past Surgical History   Procedure Laterality Date  . Hysteroscopy      uterine polypectomy Jan 7th  . Tonsillectomy    . Cervical cryotherapy  1984  . Lymph node biopsy    . Dilation and curettage, diagnostic / therapeutic  1994    Blighted Ovum  . Hysteroscopy with resectoscope  08/04/2009    Removed polyp & IUD  . Colposcopy  08/26/13  . Cervical conization w/bx N/A 07/21/2015    Procedure: CONIZATION CERVIX WITH BIOPSY;  Surgeon: Ena Dawley, MD;  Location: [facility];  Service: Gynecology;  Laterality: N/A;    Family History  Problem Relation Age of Onset  . Heart disease Sister 75    cabg stent  . Arthritis Mother   . Heart disease Mother   . Heart disease Father   . Colon cancer Father   . Pulmonary embolism Daughter   . Heart disease Sister   . Thyroid disease Sister   . Parkinson's disease Father     Social History   Social History  . Marital Status: Married    Spouse Name: N/A  . Number of Children: N/A  . Years of Education: N/A   Social History Main Topics  . Smoking status: Never Smoker   . Smokeless tobacco: Never Used  .  Alcohol Use: 0.0 oz/week    0 Standard drinks or equivalent per week     Comment: once a month  . Drug Use: No  . Sexual Activity: Yes    Birth Control/ Protection: None   Other Topics Concern  . None   Social History Narrative    [employer]. 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 albuterol (PROAIR HFA) 108 (90 BASE) MCG/ACT inhaler Inhale 2 puffs into the lungs every 6 (six) hours as needed for wheezing. 3 Inhaler 0  . aspirin EC 81 MG tablet Take 1 tablet (81 mg total) by mouth daily.    . Doxylamine Succinate, Sleep, (UNISOM PO) Take 1 tablet by mouth at bedtime as needed (for sleep).     Marland Kitchen escitalopram  (LEXAPRO) 20 MG tablet Take 1 tablet (20 mg total) by mouth daily. 30 tablet 5  . Estradiol (VAGIFEM) 10 MCG TABS vaginal tablet Place 1 each vaginally 2 (two) times a week.     Marland Kitchen ibuprofen (ADVIL,MOTRIN) 200 MG tablet Take 400 mg by mouth every 6 (six) hours as needed for headache or mild pain.     Marland Kitchen lithium carbonate (LITHOBID) 300 MG CR tablet Take 1 tablet (300 mg total) by mouth 2 (two) times daily. 180 tablet 0  . ranitidine (ZANTAC) 150 MG tablet Take 150 mg by mouth 2 (two) times daily.    Marland Kitchen levothyroxine (SYNTHROID, LEVOTHROID) 75 MCG tablet Take 75 mcg by mouth daily before breakfast.    . levothyroxine (SYNTHROID, LEVOTHROID) 75 MCG tablet Take 1 tablet (75 mcg total) by mouth daily. 10 tablet 0  . mometasone (ASMANEX) 220 MCG/INH inhaler Inhale 2 puffs into the lungs daily. Reported on 10/28/2015    . Cholecalciferol (VITAMIN D) 2000 UNITS tablet Take 2,000 Units by mouth daily.    . Hydrocodone-Acetaminophen (VICODIN) 5-300 MG TABS Take 1 tablet by mouth every 4 (four) hours as needed. 10 each 0   No facility-administered medications prior to visit.     EXAM:  BP 114/72 mmHg  Pulse 70  Temp(Src) 97.7 F (36.5 C) (Oral)  Resp 12  Ht _0  (1.549 m)  Wt 131 lb 3.2 oz (59.512 kg)  BMI 24.80 kg/m2  SpO2 96%  LMP 12/09/2013  Body mass index is 24.8 kg/(m^2).  Physical Exam: Vital signs reviewed HYW:VPXT is a well-developed well-nourished alert cooperative    who appearsr stated age in no acute distress.  HEENT: normocephalic atraumatic , Eyes: PERRL EOM's full, conjunctiva clear, Nares: paten,t no deformity discharge or tenderness., Ears: no deformity EAC's clear TMs with normal landmarks. Mouth: clear OP, no lesions, edema.  Moist mucous membranes. Dentition in adequate repair. NECK: supple without masses, thyromegaly or bruits. CHEST/PULM:  Clear to auscultation and percussion breath sounds equal no wheeze , rales or rhonchi. No chest wall deformities or tenderness. CV:  PMI is nondisplaced, S1 S2 no gallops, murmurs, rubs. Peripheral pulses are full without delay.No JVD . Breast: normal by inspection . No dimpling, discharge, masses, tenderness or discharge . ABDOMEN: Bowel sounds normal nontender  No guard or rebound, no hepato splenomegal no CVA tenderness.  No hernia. Extremtities:  No clubbing cyanosis or edema,  no acute joint swelling or redness no focal atrophy NEURO:  Oriented x3, cranial nerves 3-12 appear to be intact, no obvious focal weakness,gait within normal limits no abnormal reflexes or asymmetrical SKIN: No acute rashes normal turgor, color, no bruising or petechiae. PSYCH: Oriented, good eye contact, no obvious depression anxiety, cognition and judgment appear normal. LN: no cervical axillary inguinal adenopathy Wt Readings from Last 3 Encounters:  10/28/15 131 lb 3.2 oz (59.512 kg)  07/14/15 134 lb (60.782 kg)  12/01/14 139 lb (63.05 kg)    Lab Results  Component Value Date   WBC 8.5 10/19/2015   HGB 14.1 10/19/2015   HCT 42.5 10/19/2015   PLT 299.0 10/19/2015   GLUCOSE 98 10/19/2015   CHOL 247* 10/19/2015   TRIG 171.0* 10/19/2015   HDL 51.20 10/19/2015   LDLDIRECT 189.3 06/02/2012   LDLCALC 162* 10/19/2015   ALT 20 10/19/2015   AST 17 10/19/2015   NA 139 10/19/2015   K 4.4 10/19/2015   CL 106 10/19/2015   CREATININE 0.87 10/19/2015   BUN 21 10/19/2015   CO2 27 10/19/2015   TSH 1.20 10/19/2015    ASSESSMENT AND PLAN:  Discussed the following assessment and plan:  Visit for preventive health examination  Hypothyroidism, unspecified hypothyroidism type - refill same cont on generic if stable  Hyperlipidemia - less favorable than 2 years ago but attending to lsi risk ascvd13 2.1% 10 year hx loscacscorebut pos fam hx sister - Plan: Ambulatory referral to Lipid Clinic  Medication management  FH: premature coronary heart disease - Plan: Ambulatory referral to [facility] Interested in lipid clinic will do referral  But  all parameters not a candidate for statin except  fam hx of premature CAD and in sis Patient Care Team: Burnis Medin, MD as PCP - General Ralene Bathe, MD (Ophthalmology) Ena Dawley, MD (Obstetrics and Gynecology) Ronald Lobo, MD as Consulting Physician (Gastroenterology) Patient Instructions  Continue lifestyle intervention healthy eating and exercise . Lipids goal to improve  Can do a referral  To lipid clininc Low risk per asacvd 2013 guidelines but you have a strong family hx .    Health Maintenance, Female Adopting a healthy lifestyle and getting preventive care can go a long way to promote health and wellness. Talk with your health care provider about what schedule of regular examinations is right for you. This is a good chance for you to check in with your provider about disease prevention and staying healthy. In between checkups, there are plenty of things you can do on your own. Experts have done a lot of research about which lifestyle changes and preventive measures are most likely to keep you healthy. Ask your health care provider for more information. WEIGHT AND DIET  Eat a healthy diet  Be sure to include plenty of vegetables, fruits, low-fat dairy products, and lean protein.  Do not eat a lot of foods high in solid fats, added sugars, or salt.  Get regular exercise. This is one of the most important things you can do for your health.  Most adults should exercise for at least 150 minutes each week. The exercise should increase your heart rate and make you sweat (moderate-intensity exercise).  Most adults should also do strengthening exercises at least twice a week. This is in addition to the moderate-intensity exercise.  Maintain a healthy weight  Body mass index (BMI) is a measurement that can be used to identify possible weight problems. It estimates body fat based on height and  weight. Your health care provider can help determine your BMI and help you  achieve or maintain a healthy weight.  For females 66 years of age and older:   A BMI below 18.5 is considered underweight.  A BMI of 18.5 to 24.9 is normal.  A BMI of 25 to 29.9 is considered overweight.  A BMI of 30 and above is considered obese.  Watch levels of cholesterol and blood lipids  You should start having your blood tested for lipids and cholesterol at 56 years of age, then have this test every 5 years.  You may need to have your cholesterol levels checked more often if:  Your lipid or cholesterol levels are high.  You are older than 56 years of age.  You are at high risk for heart disease.  CANCER SCREENING   Lung Cancer  Lung cancer screening is recommended for adults 79-47 years old who are at high risk for lung cancer because of a history of smoking.  A yearly low-dose CT scan of the lungs is recommended for people who:  Currently smoke.  Have quit within the past 15 years.  Have at least a 30-pack-year history of smoking. A pack year is smoking an average of one pack of cigarettes a day for 1 year.  Yearly screening should continue until it has been 15 years since you quit.  Yearly screening should stop if you develop a health problem that would prevent you from having lung cancer treatment.  Breast Cancer  Practice breast self-awareness. This means understanding how your breasts normally appear and feel.  It also means doing regular breast self-exams. Let your health care provider know about any changes, no matter how small.  If you are in your 20s or 30s, you should have a clinical breast exam (CBE) by a health care provider every 1-3 years as part of a regular health exam.  If you are 32 or older, have a CBE every year. Also consider having a breast X-ray (mammogram) every year.  If you have a family history of breast cancer, talk to your health care provider about genetic screening.  If you are at high risk for breast cancer, talk to your  health care provider about having an MRI and a mammogram every year.  Breast cancer gene (BRCA) assessment is recommended for women who have family members with BRCA-related cancers. BRCA-related cancers include:  Breast.  Ovarian.  Tubal.  Peritoneal cancers.  Results of the assessment will determine the need for genetic counseling and BRCA1 and BRCA2 testing. Cervical Cancer Your health care provider may recommend that you be screened regularly for cancer of the pelvic organs (ovaries, uterus, and vagina). This screening involves a pelvic examination, including checking for microscopic changes to the surface of your cervix (Pap test). You may be encouraged to have this screening done every 3 years, beginning at age 36.  For women ages 26-65, health care providers may recommend pelvic exams and Pap testing every 3 years, or they may recommend the Pap and pelvic exam, combined with testing for human papilloma virus (HPV), every 5 years. Some types of HPV increase your risk of cervical cancer. Testing for HPV may also be done on women of any age with unclear Pap test results.  Other health care providers may not recommend any screening for nonpregnant women who are considered low risk for pelvic cancer and who do not have symptoms. Ask your health care provider if a screening pelvic exam is right for  you.  If you have had past treatment for cervical cancer or a condition that could lead to cancer, you need Pap tests and screening for cancer for at least 20 years after your treatment. If Pap tests have been discontinued, your risk factors (such as having a new sexual partner) need to be reassessed to determine if screening should resume. Some women have medical problems that increase the chance of getting cervical cancer. In these cases, your health care provider may recommend more frequent screening and Pap tests. Colorectal Cancer  This type of cancer can be detected and often  prevented.  Routine colorectal cancer screening usually begins at 56 years of age and continues through 56 years of age.  Your health care provider may recommend screening at an earlier age if you have risk factors for colon cancer.  Your health care provider may also recommend using home test kits to check for hidden blood in the stool.  A small camera at the end of a tube can be used to examine your colon directly (sigmoidoscopy or colonoscopy). This is done to check for the earliest forms of colorectal cancer.  Routine screening usually begins at age 75.  Direct examination of the colon should be repeated every 5-10 years through 56 years of age. However, you may need to be screened more often if early forms of precancerous polyps or small growths are found. Skin Cancer  Check your skin from head to toe regularly.  Tell your health care provider about any new moles or changes in moles, especially if there is a change in a mole's shape or color.  Also tell your health care provider if you have a mole that is larger than the size of a pencil eraser.  Always use sunscreen. Apply sunscreen liberally and repeatedly throughout the day.  Protect yourself by wearing long sleeves, pants, a wide-brimmed hat, and sunglasses whenever you are outside. HEART DISEASE, DIABETES, AND HIGH BLOOD PRESSURE   High blood pressure causes heart disease and increases the risk of stroke. High blood pressure is more likely to develop in:  People who have blood pressure in the high end of the normal range (130-139/85-89 mm Hg).  People who are overweight or obese.  People who are African American.  If you are 59-2 years of age, have your blood pressure checked every 3-5 years. If you are 23 years of age or older, have your blood pressure checked every year. You should have your blood pressure measured twice--once when you are at a hospital or clinic, and once when you are not at a hospital or clinic.  Record the average of the two measurements. To check your blood pressure when you are not at a hospital or clinic, you can use:  An automated blood pressure machine at a pharmacy.  A home blood pressure monitor.  If you are between 45 years and 61 years old, ask your health care provider if you should take aspirin to prevent strokes.  Have regular diabetes screenings. This involves taking a blood sample to check your fasting blood sugar level.  If you are at a normal weight and have a low risk for diabetes, have this test once every three years after 56 years of age.  If you are overweight and have a high risk for diabetes, consider being tested at a younger age or more often. PREVENTING INFECTION  Hepatitis B  If you have a higher risk for hepatitis B, you should be screened for this virus.  You are considered at high risk for hepatitis B if:  You were born in a country where hepatitis B is common. Ask your health care provider which countries are considered high risk.  Your parents were born in a high-risk country, and you have not been immunized against hepatitis B (hepatitis B vaccine).  You have HIV or AIDS.  You use needles to inject street drugs.  You live with someone who has hepatitis B.  You have had sex with someone who has hepatitis B.  You get hemodialysis treatment.  You take certain medicines for conditions, including cancer, organ transplantation, and autoimmune conditions. Hepatitis C  Blood testing is recommended for:  Everyone born from [birth year] through 1966.  Anyone with known risk factors for hepatitis C. Sexually transmitted infections (STIs)  You should be screened for sexually transmitted infections (STIs) including gonorrhea and chlamydia if:  You are sexually active and are younger than 56 years of age.  You are older than 56 years of age and your health care provider tells you that you are at risk for this type of infection.  Your sexual activity  has changed since you were last screened and you are at an increased risk for chlamydia or gonorrhea. Ask your health care provider if you are at risk.  If you do not have HIV, but are at risk, it may be recommended that you take a prescription medicine daily to prevent HIV infection. This is called pre-exposure prophylaxis (PrEP). You are considered at risk if:  You are sexually active and do not regularly use condoms or know the HIV status of your partner(s).  You take drugs by injection.  You are sexually active with a partner who has HIV. Talk with your health care provider about whether you are at high risk of being infected with HIV. If you choose to begin PrEP, you should first be tested for HIV. You should then be tested every 3 months for as long as you are taking PrEP.  PREGNANCY   If you are premenopausal and you may become pregnant, ask your health care provider about preconception counseling.  If you may become pregnant, take 400 to 800 micrograms (mcg) of folic acid every day.  If you want to prevent pregnancy, talk to your health care provider about birth control (contraception). OSTEOPOROSIS AND MENOPAUSE   Osteoporosis is a disease in which the bones lose minerals and strength with aging. This can result in serious bone fractures. Your risk for osteoporosis can be identified using a bone density scan.  If you are 48 years of age or older, or if you are at risk for osteoporosis and fractures, ask your health care provider if you should be screened.  Ask your health care provider whether you should take a calcium or vitamin D supplement to lower your risk for osteoporosis.  Menopause may have certain physical symptoms and risks.  Hormone replacement therapy may reduce some of these symptoms and risks. Talk to your health care provider about whether hormone replacement therapy is right for you.  HOME CARE INSTRUCTIONS   Schedule regular health, dental, and eye  exams.  Stay current with your immunizations.   Do not use any tobacco products including cigarettes, chewing tobacco, or electronic cigarettes.  If you are pregnant, do not drink alcohol.  If you are breastfeeding, limit how much and how often you drink alcohol.  Limit alcohol intake to no more than 1 drink per day for nonpregnant women. One drink equals  12 ounces of beer, 5 ounces of wine, or 1 ounces of hard liquor.  Do not use street drugs.  Do not share needles.  Ask your health care provider for help if you need support or information about quitting drugs.  Tell your health care provider if you often feel depressed.  Tell your health care provider if you have ever been abused or do not feel safe at home.   This information is not intended to replace advice given to you by your health care provider. Make sure you discuss any questions you have with your health care provider.   Document Released: 01/29/2011 Document Revised: 08/06/2014 Document Reviewed: 06/17/2013 Elsevier Interactive Patient Education 2016 [place]. Ashtin Melichar M.D.

## 2015-10-29 DIAGNOSIS — E785 Hyperlipidemia, unspecified: Secondary | ICD-10-CM | POA: Insufficient documentation

## 2015-11-04 ENCOUNTER — Telehealth: Payer: Self-pay | Admitting: Internal Medicine

## 2015-11-04 NOTE — Telephone Encounter (Signed)
Lisa Lee took care and called pt with results.

## 2015-11-04 NOTE — Telephone Encounter (Signed)
Pt following up on referral for the lipid clinic.

## 2015-11-04 NOTE — Telephone Encounter (Signed)
Error/ltd ° °

## 2015-11-17 ENCOUNTER — Ambulatory Visit: Payer: BLUE CROSS/BLUE SHIELD | Admitting: Internal Medicine

## 2015-11-25 ENCOUNTER — Encounter: Payer: Self-pay | Admitting: Internal Medicine

## 2015-11-28 ENCOUNTER — Telehealth: Payer: Self-pay | Admitting: Family Medicine

## 2015-11-28 NOTE — Telephone Encounter (Signed)
Please get us a copy of the OV note as i dont have this in our ehr Sounds like asthmic    ? Did they do a chest x ray?   Not typical standard of care to prescribe  CS when not seen but if we get  Appropriate documentation we can send in robitussin with codiene cough med 1-2 tsp every 4-6 hours if needed  . Disp 6 oz

## 2015-11-28 NOTE — Progress Notes (Signed)
Pre visit review using our clinic review tool, if applicable. No additional management support is needed unless otherwise documented below in the visit note.  Chief Complaint  Patient presents with  . Follow Up Urgent Care    cough ongogin after rx  need help with cough    HPI: Lacye D Conchar-Mabe 55 y.o. comes in fromo  [facility] see e mail  rx for bronchitis with pred zpack and now hs back pain like pleurisy   Ibuprofen helped  Concerned if not help w coudh may end  Up with pain had eval in past  Via ed pelurisy pain  [facility] said they dont prescribe narcotic cough meds .  Phenergan and Mucinex dm  Not helpful .  Here with husband    Today ....  ? At fever at onset .  Colored  phelgm .  Now dry cough fits ,   No vomiting   And some ok better .   Cough worse at night.  No other contacts.  prd not helping that much   Was off asmanex  at onset  ROS: See pertinent positives and negatives per HPI. No hemoptysis  No sob today nsome hoarseness Brother age 64 had pos stress test  And cabg to have  appt with cards  For risk assessment Past Medical History  Diagnosis Date  . Asthma   . Depression   . Allergic rhinitis   . Hypothyroidism   . 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 Hemorrhoids   . Diarrhea 08/09/2011    Poss ibs  Vs other  Related to stress  consdier other eval if needed. Had colonoscoopy per dr Matthias Hughs   . Medication side effect 11/01/2011    tussionex   distrubed sleep     Family History  Problem Relation Age of Onset  . Heart disease Sister 39    cabg stent  . Arthritis Mother   . Heart disease Mother   . Heart disease Father   . Colon cancer Father   . Pulmonary embolism Daughter   . Heart disease Sister   . Thyroid disease Sister   . Parkinson's disease Father     Social History   Social History  . Marital Status: Married    Spouse Name: N/A  . Number of Children: N/A  . Years of Education: N/A   Social History Main Topics  . Smoking status: Never Smoker   . Smokeless tobacco:  Never Used  . Alcohol Use: 0.0 oz/week    0 Standard drinks or equivalent per week     Comment: once a month  . Drug Use: No  . Sexual Activity: Yes    Birth Control/ Protection: None   Other Topics Concern  . None   Social History Narrative    [employer]. 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 albuterol (PROAIR HFA) 108 (90 BASE) MCG/ACT inhaler Inhale 2 puffs into the lungs every 6 (six) hours as needed for wheezing. 3 Inhaler 0  . aspirin EC 81 MG tablet Take 1 tablet (81 mg total) by mouth daily.    . Coenzyme Q10 (COQ10) 200 MG CAPS Take 1 capsule by mouth daily.    Marland Kitchen  Doxylamine Succinate, Sleep, (UNISOM PO) Take 1 tablet by mouth at bedtime as needed (for sleep).     Marland Kitchen. escitalopram (LEXAPRO) 20 MG tablet Take 1 tablet (20 mg total) by mouth daily. 30 tablet 5  . Estradiol (VAGIFEM) 10 MCG TABS vaginal tablet Place 1 each vaginally 2 (two) times a week.     Marland Kitchen. ibuprofen (ADVIL,MOTRIN) 200 MG tablet Take 400 mg by mouth every 6 (six) hours as needed for headache or mild pain.     Marland Kitchen. levothyroxine (SYNTHROID, LEVOTHROID) 75 MCG tablet Take 1 tablet (75 mcg total) by mouth daily. 90 tablet 3  . lithium carbonate (LITHOBID) 300 MG CR tablet Take 1 tablet (300 mg total) by mouth 2 (two) times daily. 180 tablet 0  . mometasone (ASMANEX) 220 MCG/INH inhaler Inhale 2 puffs into the lungs daily. Reported on 10/28/2015    . ranitidine (ZANTAC) 150 MG tablet Take 150 mg by mouth 2 (two) times daily.     No facility-administered medications prior to visit.     EXAM:  BP 100/76 mmHg  Pulse 62  Temp(Src) 97.9 F (36.6 C) (Oral)  Wt 134 lb 8 oz (61.009 kg)  SpO2 98%  LMP 12/09/2013  Body mass index is 25.43 kg/(m^2).  GENERAL: vitals reviewed and  listed above, alert, oriented, appears well hydrated and in no acute distress mild hoarse no stridor here with husband  HEENT: atraumatic, conjunctiva  clear, no obvious abnormalities on inspection of external nose and ears OP : no lesion edema or exudate   cresenbt redness no drainage face nt  NECK: no obvious masses on inspection  No adenopathy  LUNGS: clear to auscultation bilaterally, no wheezes, rales or rhonchi, good air movement CV: HRRR, no clubbing cyanosis or  peripheral edema nl cap refill  MS: moves all extremities without noticeable focal  abnormality PSYCH: pleasant and cooperative, no obvious depression or anxiety  ASSESSMENT AND PLAN:  Discussed the following assessment and plan:  Cough  Acute bronchitis, unspecified organism  Medication management  Asthma, unspecified asthma severity, uncomplicated - may be adding to cough severity and airway irritability rx cough supp at night  contact us if needs refill  Reasonable to have on hand in future based on hx  Trying to avoid pleuritis pain that  Sent her to ed last time.  If cough  persistent or progressive plan other fu  But suspect will calm down in another week or so  -Patient advised to return or notify health care team  if symptoms worsen ,persist or new concerns arise.  Patient Instructions  Add back the Asmanex...  Cough med at night .  As needed.  No cough drops.  Call  Or email if need refilll or other issues.      Neta MendsWanda K. Tamel Abel M.D.

## 2015-11-28 NOTE — Telephone Encounter (Signed)
[facility] get [facility] on [address] on this patient.  Thanks!

## 2015-11-29 ENCOUNTER — Ambulatory Visit (INDEPENDENT_AMBULATORY_CARE_PROVIDER_SITE_OTHER): Payer: BLUE CROSS/BLUE SHIELD | Admitting: Internal Medicine

## 2015-11-29 ENCOUNTER — Encounter: Payer: Self-pay | Admitting: Internal Medicine

## 2015-11-29 VITALS — BP 100/76 | HR 62 | Temp 97.9°F | Wt 134.5 lb

## 2015-11-29 DIAGNOSIS — J209 Acute bronchitis, unspecified: Secondary | ICD-10-CM | POA: Diagnosis not present

## 2015-11-29 DIAGNOSIS — R05 Cough: Secondary | ICD-10-CM | POA: Diagnosis not present

## 2015-11-29 DIAGNOSIS — Z79899 Other long term (current) drug therapy: Secondary | ICD-10-CM | POA: Diagnosis not present

## 2015-11-29 DIAGNOSIS — J45909 Unspecified asthma, uncomplicated: Secondary | ICD-10-CM

## 2015-11-29 DIAGNOSIS — R059 Cough, unspecified: Secondary | ICD-10-CM

## 2015-11-29 MED ORDER — HYDROCODONE-HOMATROPINE 5-1.5 MG/5ML PO SYRP: 5.0000 mL | ORAL_SOLUTION | ORAL | Status: AC | PRN

## 2015-11-29 NOTE — Patient Instructions (Signed)
Add back the Asmanex...  Cough med at night .  As needed.  No cough drops.  Call  Or email if need refilll or other issues.

## 2015-12-02 NOTE — Telephone Encounter (Signed)
[facility] need a release form sent for records on pt.

## 2015-12-05 NOTE — Telephone Encounter (Signed)
Pt will call and have record faxed.

## 2015-12-06 NOTE — Telephone Encounter (Signed)
Spoke to the pt.  She went to [facility] on [address] and signed release.  Please get records.  Their phone number is [phone]

## 2015-12-13 ENCOUNTER — Other Ambulatory Visit: Payer: Self-pay | Admitting: Internal Medicine

## 2015-12-14 ENCOUNTER — Encounter: Payer: Self-pay | Admitting: Cardiology

## 2015-12-14 ENCOUNTER — Ambulatory Visit (INDEPENDENT_AMBULATORY_CARE_PROVIDER_SITE_OTHER): Payer: BLUE CROSS/BLUE SHIELD | Admitting: Cardiology

## 2015-12-14 VITALS — BP 108/66 | HR 63 | Ht 61.5 in | Wt 132.8 lb

## 2015-12-14 DIAGNOSIS — E785 Hyperlipidemia, unspecified: Secondary | ICD-10-CM

## 2015-12-14 DIAGNOSIS — Z8249 Family history of ischemic heart disease and other diseases of the circulatory system: Secondary | ICD-10-CM

## 2015-12-14 MED ORDER — ALBUTEROL SULFATE HFA 108 (90 BASE) MCG/ACT IN AERS: 2.0000 | INHALATION_SPRAY | Freq: Four times a day (QID) | RESPIRATORY_TRACT | Status: DC | PRN

## 2015-12-14 MED ORDER — ROSUVASTATIN CALCIUM 10 MG PO TABS: 10.0000 mg | ORAL_TABLET | Freq: Every day | ORAL | Status: DC

## 2015-12-14 NOTE — Patient Instructions (Signed)
Your physician has recommended you make the following change in your medication:  1.) start rosuvastatin 10 mg once daily  Your physician recommends that you return for lab work in: BMET, LFTS, LIPIDS --about 1 month after starting cholesterol medicine.  Your physician wants you to follow-up in: 1 year with Dr. Delton SeeNelson.  You will receive a reminder letter in the mail two months in advance. If you don't receive a letter, please call our office to schedule the follow-up appointment.

## 2015-12-14 NOTE — Progress Notes (Signed)
Cardiology Office Note    Date:  12/14/2015   ID:  Lisa Lee, DOB 1960-11-20, MRN [id number]  PCP:  Lorretta Harp, MD  Cardiologist:  Tobias Alexander, MD   Chief Complaint  Patient presents with  . Establish Care    Family Cardiac problems   History of Present Illness:  Lisa Lee is a 55 y.o. female with hyperlipidemia and significant FH of premature CAD. She is very active and asymptomatic. She is on carb free diet and recently lost 15 lbs. Denies any SOB, CP, no LE edema, no orthopnea, palpitations or syncope. She underwent a calcium scoring in 2013 that was 0.   Past Medical History  Diagnosis Date  . Asthma   . Depression   . Allergic rhinitis   . Hypothyroidism   . 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 Hemorrhoids   . Diarrhea 08/09/2011    Poss ibs  Vs other  Related to stress  consdier other eval if needed. Had colonoscoopy per dr Matthias Hughs   . Medication side effect 11/01/2011    tussionex   distrubed sleep    Past Surgical History  Procedure Laterality Date  . Hysteroscopy      uterine polypectomy Jan 7th  . Tonsillectomy    . Cervical cryotherapy  1984  . Lymph node biopsy    . Dilation and curettage, diagnostic / therapeutic  1994    Blighted Ovum  . Hysteroscopy with resectoscope  08/04/2009    Removed polyp & IUD  . Colposcopy  08/26/13  . Cervical conization w/bx N/A 07/21/2015    Procedure: CONIZATION CERVIX WITH BIOPSY;  Surgeon: Kirkland Hun, MD;  Location: [facility];  Service: Gynecology;  Laterality: N/A;    Current Medications: Outpatient Prescriptions Prior to Visit  Medication Sig Dispense Refill  . albuterol (PROAIR HFA) 108 (90 Base) MCG/ACT inhaler Inhale 2 puffs into the lungs every 6 (six) hours as needed for wheezing. 3 Inhaler 0  . aspirin EC 81 MG tablet Take 1 tablet (81 mg total) by mouth daily.    . Coenzyme Q10 (COQ10) 200 MG CAPS Take 1 capsule by mouth daily.    . Doxylamine Succinate, Sleep, (UNISOM PO) Take 1 tablet by  mouth at bedtime as needed (for sleep).     Marland Kitchen escitalopram (LEXAPRO) 20 MG tablet Take 1 tablet (20 mg total) by mouth daily. 30 tablet 5  . Estradiol (VAGIFEM) 10 MCG TABS vaginal tablet Place 1 each vaginally 2 (two) times a week.     Marland Kitchen ibuprofen (ADVIL,MOTRIN) 200 MG tablet Take 400 mg by mouth every 6 (six) hours as needed for headache or mild pain.     Marland Kitchen levothyroxine (SYNTHROID, LEVOTHROID) 75 MCG tablet Take 1 tablet (75 mcg total) by mouth daily. 90 tablet 3  . lithium carbonate (LITHOBID) 300 MG CR tablet Take 1 tablet (300 mg total) by mouth 2 (two) times daily. 180 tablet 0  . mometasone (ASMANEX) 220 MCG/INH inhaler Inhale 2 puffs into the lungs daily. Reported on 10/28/2015    . ranitidine (ZANTAC) 150 MG tablet Take 150 mg by mouth 2 (two) times daily.     No facility-administered medications prior to visit.     Allergies:   Penicillins   Social History   Social History  . Marital Status: Married    Spouse Name: N/A  . Number of Children: N/A  . Years of Education: N/A   Social History Main Topics  . Smoking status: Never Smoker   .  Smokeless tobacco: Never Used  . Alcohol Use: 0.0 oz/week    0 Standard drinks or equivalent per week     Comment: once a month  . Drug Use: No  . Sexual Activity: Yes    Birth Control/ Protection: None   Other Topics Concern  . None   Social History Narrative    [employer]. 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 Father had MI at age 4.  Sister stents at 36, CABG x 4 at  12. Brother at 70 CABG x 3.   ROS:   Please see the history of present illness.    ROS All other systems reviewed and are negative.  PHYSICAL EXAM:   VS:  BP 108/66 mmHg  Pulse 63  Ht 5' 1.5" (1.562 m)  Wt 132 lb 12.8 oz (60.238 kg)  BMI 24.69 kg/m2  SpO2 98%  LMP 12/09/2013   GEN: Well nourished, well developed, in no acute distress HEENT: normal Neck: no JVD, carotid bruits, or masses Cardiac: RRR; no murmurs, rubs, or gallops,no edema  Respiratory:  clear to auscultation bilaterally, normal work of breathing GI: soft, nontender, nondistended, + BS MS: no deformity or atrophy Skin: warm and dry, no rash Neuro:  Alert and Oriented x 3, Strength and sensation are intact Psych: euthymic mood, full affect  Wt Readings from Last 3 Encounters:  12/14/15 132 lb 12.8 oz (60.238 kg)  11/29/15 134 lb 8 oz (61.009 kg)  10/28/15 131 lb 3.2 oz (59.512 kg)    Studies/Labs Reviewed:   EKG:  EKG is ordered and shows SR, normal ECG.  Recent Labs: 10/19/2015: ALT 20; BUN 21; Creatinine, Ser 0.87; Hemoglobin 14.1; Platelets 299.0; Potassium 4.4; Sodium 139; TSH 1.20   Lipid Panel    Component Value Date/Time   CHOL 247* 10/19/2015 0957   TRIG 171.0* 10/19/2015 0957   HDL 51.20 10/19/2015 0957   CHOLHDL 5 10/19/2015 0957   VLDL 34.2 10/19/2015 0957   LDLCALC 162* 10/19/2015 0957   LDLDIRECT 189.3 06/02/2012 1300    Additional studies/ records that were reviewed today include:  Calcium scoring in 2013: 0    ASSESSMENT:    1. Hyperlipidemia   2. Family history of early CAD      PLAN:  In order of problems listed above:  1. The patient is active and completely asymptomatic. Her family h/o early CAD is very significant however the fact that she had normal calcium score 4 years ago is reassuring. 2. However I would start atorvastatin 10 mg po daily and recheck CMP and lipids in 1 month.  3. She has no signs of cholesterol deposits - xanthomas,  xanthelasmas.    Medication Adjustments/Labs and Tests Ordered: Current medicines are reviewed at length with the patient today.  Concerns regarding medicines are outlined above.  Medication changes, Labs and Tests ordered today are listed in the Patient Instructions below. Patient Instructions  Your physician has recommended you make the following change  in your medication:  1.) start rosuvastatin 10 mg once daily  Your physician recommends that you return for lab work in: BMET, LFTS, LIPIDS --about 1 month after starting cholesterol medicine.  Your physician wants you to follow-up in: 1 year with Dr. Delton SeeNelson.  You will receive a reminder letter in the mail two months in advance. If you don't receive a letter, please call our office to schedule the follow-up appointment.      Signed, Tobias AlexanderKatarina Tonishia Steffy, MD  12/14/2015 11:28 PM    [facility] [address], [place], [place]  [address] Phone: [phone](336) [phone]; Fax: [phone]) [phone]

## 2015-12-14 NOTE — Telephone Encounter (Signed)
Sent to the pharmacy by e-scribe. 

## 2016-01-09 ENCOUNTER — Telehealth: Payer: Self-pay | Admitting: Cardiology

## 2016-01-09 MED ORDER — PRAVASTATIN SODIUM 20 MG PO TABS: 20.0000 mg | ORAL_TABLET | Freq: Every evening | ORAL | Status: DC

## 2016-01-09 NOTE — Telephone Encounter (Signed)
Pt is calling stating that she has been taking Crestor for about 3 weeks now, and she feels she is unable to tolerate this statin.  Pt states that its causing her joints to ache, especially her hands, shoulders, back, and upper extremities.  Pt states it has caused her one episode of dizziness as well. Pt also states that ever since she started taking Crestor about 3 weeks ago, its causing her worsening indigestion and reflux.  Pt would like for Dr Delton SeeNelson to advise on a different regimen. Informed the pt that Dr Delton SeeNelson is currently out of the office today, but I will route this message to her for further review and recommendation and follow-up with the pt thereafter.  Pt verbalized understanding and agrees with this plan.

## 2016-01-09 NOTE — Telephone Encounter (Signed)
Left a message for the pt to call back on both numbers provided, to ask how she is taking her statin, per Dr Delton SeeNelson.

## 2016-01-09 NOTE — Telephone Encounter (Signed)
Mrs. Marquis BuggyConchare - Mabe is returining your call.. Thanks

## 2016-01-09 NOTE — Telephone Encounter (Signed)
Spoke with the pt and she states she is taking her statin at bedtime, and if she does eat at night, its only a very healthy and light snack.  Informed the pt that I will make Dr Delton SeeNelson aware of this, and follow-up with her with recommendations.  Pt verbalized understanding and agrees with this plan.

## 2016-01-09 NOTE — Telephone Encounter (Signed)
Please change to pravastatin 20 mg po daily

## 2016-01-09 NOTE — Telephone Encounter (Signed)
New Message  Pt c/o medication issue: 1. Name of Medication: rosuvastatin (CRESTOR) 10 MG tablet ( on crestor for 3 weeks)   3. Are you having a reaction (difficulty breathing--STAT)?  Pain in her hands shoulders and back. Pt also reports brief episodes of dizziness . Worsening reflux of indigestions.

## 2016-01-09 NOTE — Telephone Encounter (Signed)
Any suggestion Kennon RoundsSally? Not sure why she has these symptoms and if changing to other statin would help.  Lajoyce Cornersvy did she say when is she using it? (before or after food? What time of a day?)

## 2016-01-09 NOTE — Telephone Encounter (Signed)
Pt called back and received the message to stop rosuvastatin and start pravastatin 20 mg po daily.  Informed the pt that this was sent in earlier today, as a month supply to her confirmed local pharmacy of choice. Pt verbalized understanding and agrees with this plan.

## 2016-01-09 NOTE — Telephone Encounter (Signed)
Re-routing

## 2016-01-09 NOTE — Telephone Encounter (Signed)
Please switch to pravastatin 20 mg po daily

## 2016-01-09 NOTE — Telephone Encounter (Signed)
Left a detailed message (as the pt requested for she is unable to be contacted for work reasons) on the pts confirmed VM,  that per Dr Delton SeeNelson, she wants to discontinue her rosuvastatin, and switch her to pravastatin 20 mg po daily.  Per earlier conversation with the pt, she wants this new med change to be sent as a month supply to her local pharmacy, [facility], to make sure she tolerates this or not.  Updated the pts allergies as rosuvastatin being an intolerance, causing shoulder, hand, back pain, and worsening indigestion.  Called the pravastatin into the pts pharmacy of choice.  Left a detailed message on the pts confirmed VM, to call the office back with any questions regarding this switch.

## 2016-01-24 ENCOUNTER — Other Ambulatory Visit (INDEPENDENT_AMBULATORY_CARE_PROVIDER_SITE_OTHER): Payer: BLUE CROSS/BLUE SHIELD

## 2016-01-24 DIAGNOSIS — Z79899 Other long term (current) drug therapy: Secondary | ICD-10-CM

## 2016-01-24 DIAGNOSIS — Z8249 Family history of ischemic heart disease and other diseases of the circulatory system: Secondary | ICD-10-CM

## 2016-01-24 DIAGNOSIS — E785 Hyperlipidemia, unspecified: Secondary | ICD-10-CM

## 2016-01-24 LAB — LIPID PANEL
Cholesterol: 170 mg/dL (ref 125–200)
HDL: 59 mg/dL (ref >=46)
LDL Cholesterol: 81 mg/dL (ref <130)
Total CHOL/HDL Ratio: 2.9 Ratio (ref <=5.0)
Triglycerides: 151 mg/dL — ABNORMAL HIGH (ref <150)
VLDL: 30 mg/dL (ref <30)

## 2016-01-24 LAB — HEPATIC FUNCTION PANEL
ALT: 18 U/L (ref 6–29)
AST: 16 U/L (ref 10–35)
Albumin: 4.4 g/dL (ref 3.6–5.1)
Alkaline Phosphatase: 57 U/L (ref 33–130)
Bilirubin, Direct: 0.1 mg/dL (ref <=0.2)
Indirect Bilirubin: 0.3 mg/dL (ref 0.2–1.2)
Total Bilirubin: 0.4 mg/dL (ref 0.2–1.2)
Total Protein: 6.6 g/dL (ref 6.1–8.1)

## 2016-01-24 LAB — BASIC METABOLIC PANEL
BUN: 15 mg/dL (ref 7–25)
CO2: 21 mmol/L (ref 20–31)
Calcium: 9.9 mg/dL (ref 8.6–10.4)
Chloride: 109 mmol/L (ref 98–110)
Creat: 0.82 mg/dL (ref 0.50–1.05)
Glucose, Bld: 96 mg/dL (ref 65–99)
Potassium: 4.6 mmol/L (ref 3.5–5.3)
Sodium: 140 mmol/L (ref 135–146)

## 2016-01-24 LAB — LITHIUM LEVEL: LITHIUM LVL: 0.8 mmol/L (ref 0.6–1.2)

## 2016-01-27 ENCOUNTER — Telehealth: Payer: Self-pay | Admitting: *Deleted

## 2016-01-27 NOTE — Telephone Encounter (Signed)
[facility] and advised [facility] to send this pts results from 01/24/16 to Dr Delton SeeNelson to result and advise on. Informed [facility] that there appears to be a delay in getting this pts lab results back.  [facility], they had to troubleshoot this and now it is available to review by Dr Delton SeeNelson. Will follow-up with the pt accordingly, after Dr Delton SeeNelson advises.

## 2016-01-27 NOTE — Telephone Encounter (Signed)
-----  Message from Dorothy Spark, MD sent at 01/27/2016 12:04 PM EDT ----- I met this patient yesterday and she asked me if her lipids came back, please tell her that they are still pending. Thank you, KN

## 2016-02-02 ENCOUNTER — Encounter: Payer: Self-pay | Admitting: Cardiology

## 2016-02-06 ENCOUNTER — Other Ambulatory Visit: Payer: Self-pay | Admitting: Cardiology

## 2016-02-08 NOTE — Telephone Encounter (Signed)
Please refill pravastatin 20 mg po daily for this pt.

## 2016-02-13 ENCOUNTER — Ambulatory Visit (INDEPENDENT_AMBULATORY_CARE_PROVIDER_SITE_OTHER)
Admission: RE | Admit: 2016-02-13 | Discharge: 2016-02-13 | Disposition: A | Discharge status: Home / Self Care | Payer: BLUE CROSS/BLUE SHIELD | Source: Ambulatory Visit | Attending: Internal Medicine | Admitting: Internal Medicine

## 2016-02-13 ENCOUNTER — Encounter: Payer: Self-pay | Admitting: Cardiology

## 2016-02-13 ENCOUNTER — Ambulatory Visit (INDEPENDENT_AMBULATORY_CARE_PROVIDER_SITE_OTHER): Payer: BLUE CROSS/BLUE SHIELD | Admitting: Internal Medicine

## 2016-02-13 ENCOUNTER — Encounter: Payer: Self-pay | Admitting: Internal Medicine

## 2016-02-13 VITALS — BP 116/68 | HR 72 | Temp 98.3°F | Ht 61.5 in | Wt 133.2 lb

## 2016-02-13 DIAGNOSIS — J45909 Unspecified asthma, uncomplicated: Secondary | ICD-10-CM

## 2016-02-13 DIAGNOSIS — J989 Respiratory disorder, unspecified: Secondary | ICD-10-CM

## 2016-02-13 DIAGNOSIS — R509 Fever, unspecified: Principal | ICD-10-CM

## 2016-02-13 MED ORDER — HYDROCODONE-HOMATROPINE 5-1.5 MG/5ML PO SYRP: ORAL_SOLUTION | ORAL | Status: DC

## 2016-02-13 NOTE — Progress Notes (Signed)
Pre visit review using our clinic review tool, if applicable. No additional management support is needed unless otherwise documented below in the visit note. 

## 2016-02-13 NOTE — Patient Instructions (Signed)
Get x ray in case we are missing PNA on exam.   If not probably viral infection  Cough med as comfort .  Can go back to work 24 hours after fever gone .

## 2016-02-13 NOTE — Progress Notes (Signed)
Chief Complaint  Patient presents with  . Cough    yellow in the morning, clear rest of day. Started Thursday, highest fever was 101, pt has been taking ibuprofen.    HPI: Lisa Lee 55 y.o. comes in today for an acute visit. Wonders if she could have bronchitis. Onset 4 days ago  Yesterday  Had temp 101 or such  Fever x ? 48 hours  Or so  ongoing was in bed all weekend. 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 No vomiting. No unusual rashes. Used inhaler ? Cough med  ROS: See pertinent positives and negatives per HPI. No hemoptysis abdominal pain vomiting associated.  Past Medical History  Diagnosis Date  . Asthma   . Depression   . Allergic rhinitis   . Hypothyroidism   . 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 Hemorrhoids   . Diarrhea 08/09/2011    Poss ibs  Vs other  Related to stress  consdier other eval if needed. Had colonoscoopy per dr Matthias Hughs   . Medication side effect 11/01/2011    tussionex   distrubed sleep     Family History  Problem Relation Age of Onset  . Heart disease Sister 17    cabg stent  . Arthritis Mother   . Heart disease Mother   . Heart disease Father   . Colon cancer Father   . Pulmonary embolism Daughter   . Heart disease Sister   . Thyroid disease Sister   . Parkinson's disease Father     Social History   Social History  . Marital Status: Married    Spouse Name: N/A  . Number of Children: N/A  . Years of Education: N/A   Social History Main Topics  . Smoking status: Never Smoker   . Smokeless tobacco: Never Used  . Alcohol Use: 0.0 oz/week    0 Standard drinks or equivalent per week     Comment: once a month  . Drug Use: No  . Sexual Activity: Yes    Birth Control/ Protection: None   Other Topics Concern  . None   Social History Narrative    And [employer]. 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 albuterol (PROAIR HFA) 108 (90 Base) MCG/ACT inhaler Inhale 2 puffs into the lungs every 6 (six) hours as needed for wheezing. 3 Inhaler 0  . aspirin EC 81 MG tablet Take 1 tablet (81 mg total) by mouth daily.    . Coenzyme Q10 (COQ10) 200 MG CAPS Take 1 capsule by mouth daily.    . Doxylamine Succinate, Sleep, (UNISOM PO) Take 1 tablet by mouth at bedtime as needed (for sleep).     Marland Kitchen escitalopram (LEXAPRO) 20 MG tablet Take 1 tablet (20 mg total) by mouth daily. 30 tablet 5  . Estradiol (VAGIFEM) 10 MCG TABS vaginal tablet Place 1 each vaginally 2 (two) times a week.     Marland Kitchen ibuprofen (ADVIL,MOTRIN) 200 MG tablet Take 400 mg by mouth every 6 (six) hours as needed for headache or mild pain.     Marland Kitchen levothyroxine (SYNTHROID, LEVOTHROID) 75 MCG tablet Take 1 tablet (75 mcg total)  by mouth daily. 90 tablet 3  . lithium carbonate (LITHOBID) 300 MG CR tablet Take 1 tablet (300 mg total) by mouth 2 (two) times daily. 180 tablet 0  . mometasone (ASMANEX) 220 MCG/INH inhaler Inhale 2 puffs into the lungs daily. Reported on 10/28/2015    . pravastatin (PRAVACHOL) 20 MG tablet Take 1 tablet (20 mg total) by mouth every evening. 30 tablet 1  . ranitidine (ZANTAC) 150 MG tablet Take 150 mg by mouth 2 (two) times daily.     No facility-administered medications prior to visit.     EXAM:  BP 116/68 mmHg  Pulse 72  Temp(Src) 98.3 F (36.8 C) (Oral)  Ht 5' 1.5" (1.562 m)  Wt 133 lb 4 oz (60.442 kg)  BMI 24.77 kg/m2  SpO2 97%  LMP 12/09/2013  Body mass index is 24.77 kg/(m^2). WDWN in NAD  quiet respirations; mildly congested  somewhat hoarse. Non toxic . HEENT: Normocephalic ;atraumatic , Eyes;  PERRL, EOMs  Full, lids and conjunctiva clear,,Ears: no deformities, canals nl, TM landmarks normal, Nose: no deformity or discharge but congested;face minimally tender Mouth : OP  clear without lesion or edema . Neck: Supple without adenopathy or masses or bruits Chest:  Clear to A&P without wheezes rales or rhonchi CV:  S1-S2 no gallops or murmurs peripheral perfusion is normal Skin :nl perfusion and no acute rashes    ASSESSMENT AND PLAN:  Discussed the following assessment and plan:  Febrile respiratory illness - Plan: DG Chest 2 View  Asthma, unspecified asthma severity, uncomplicated Acts like a flulike illness without flaring up her asthma if persistent fever would get chest x-ray to look for occult pneumonia not seen on exam. Discussed this option prefers to do this test today. Cough med for comfort expectant management. Return to we are when fever is gone for 24 hours. -Patient advised to return or notify health care team  if symptoms worsen ,persist or new concerns arise. Underlying asthma does not appear to be flaring at this time. Patient Instructions  Get x ray in case we are missing PNA on exam.   If not probably viral infection  Cough med as comfort .  Can go back to work 24 hours after fever gone .       Neta MendsWanda K. Yi Haugan M.D.

## 2016-02-14 ENCOUNTER — Ambulatory Visit: Payer: Self-pay | Admitting: Internal Medicine

## 2016-02-14 MED ORDER — PRAVASTATIN SODIUM 20 MG PO TABS: 20.0000 mg | ORAL_TABLET | Freq: Every evening | ORAL | Status: DC

## 2016-02-14 NOTE — Telephone Encounter (Signed)
Reordered Pravastatin 20 mg per Dr. Delton SeeNelson.  Pt states she is scheduled to have her physical in March with [facility]CP [facility]

## 2016-02-28 ENCOUNTER — Encounter: Payer: Self-pay | Admitting: Internal Medicine

## 2016-03-02 NOTE — Telephone Encounter (Signed)
Make sure she is  taking her asthma steroid medication  consideration of prednisone since seems like infection is gone  But would as for ROV  To reassess before prescribing this  If not improving after the weekend  Then consider rov to reevalauate.

## 2016-03-05 ENCOUNTER — Telehealth: Payer: Self-pay | Admitting: Internal Medicine

## 2016-03-05 NOTE — Telephone Encounter (Signed)
Pt advised to see Dr Fabian SharpPanosh if not better this week concerning her ongoing cough. (via [facility]) Pt is off on Wed, OK to schedule?

## 2016-03-05 NOTE — Telephone Encounter (Signed)
Please schedule

## 2016-03-05 NOTE — Telephone Encounter (Signed)
Pt has been scheduled.  °

## 2016-03-06 NOTE — Progress Notes (Signed)
Pre visit review using our clinic review tool, if applicable. No additional management support is needed unless otherwise documented below in the visit note.  Chief Complaint  Patient presents with  . Cough    HPI: Lisa Lee 55 y.o.  Here with spouse  For  ongoing cough  Seen mid July for febrile resp infection with cough and neg x ray  No pna  Has persistent cough  Hx of asthma  Coughing jags    Hard to get up clear mucous   .   Feels some better but still episodes and husband thinks worse .    Head congestion   Some  Not alarming  Using stelazine    Some  gerd at some point .   Seems to get his  Twice a year or so   Had this a few months ago . Hx of " asthma "  On asmanex  Stopped   At her check up cause she was doing so well and now back on  Not using iNCS  Had used protonix in the apst for hb ? 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  Past Medical History:  Diagnosis Date  . Allergic rhinitis   . Asthma   . Depression   . Diarrhea 08/09/2011   Poss ibs  Vs other  Related to stress  consdier other eval if needed. Had colonoscoopy per dr Matthias Hughs   . Hemorrhoids   . 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 Hypothyroidism   . Medication side effect 11/01/2011   tussionex   distrubed sleep     Family History  Problem Relation Age of Onset  . Heart disease Sister 73    cabg stent  . Arthritis Mother   . Heart disease Mother   . Heart disease Father   . Colon cancer Father   . Pulmonary embolism Daughter   . Heart disease Sister   . Thyroid disease Sister   . Parkinson's disease Father     Social History   Social History  . Marital status: Married    Spouse name: N/A  . Number of children: N/A  . Years of education: N/A   Social History Main Topics  . Smoking status: Never Smoker  . Smokeless tobacco: Never Used  . Alcohol use 0.0 oz/week   Comment: once a month  . Drug use: No  . Sexual activity: Yes    Birth control/ protection: None   Other Topics Concern  . None   Social History Narrative    [employer]. 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 albuterol (PROAIR HFA) 108 (90 Base) MCG/ACT inhaler Inhale 2 puffs into the lungs every 6 (six) hours as needed for wheezing. 3 Inhaler 0  . aspirin EC 81 MG tablet Take 1 tablet (81 mg total) by mouth daily.    . Coenzyme Q10 (COQ10) 200 MG CAPS Take 1 capsule by mouth daily.    . Doxylamine Succinate, Sleep, (UNISOM PO) Take 1 tablet by mouth at bedtime as needed (for sleep).     Marland Kitchen  escitalopram (LEXAPRO) 20 MG tablet Take 1 tablet (20 mg total) by mouth daily. 30 tablet 5  . Estradiol (VAGIFEM) 10 MCG TABS vaginal tablet Place 1 each vaginally 2 (two) times a week.     Marland Kitchen. ibuprofen (ADVIL,MOTRIN) 200 MG tablet Take 400 mg by mouth every 6 (six) hours as needed for headache or mild pain.     Marland Kitchen. levothyroxine (SYNTHROID, LEVOTHROID) 75 MCG tablet Take 1 tablet (75 mcg total) by mouth daily. 90 tablet 3  . lithium carbonate (LITHOBID) 300 MG CR tablet Take 1 tablet (300 mg total) by mouth 2 (two) times daily. 180 tablet 0  . mometasone (ASMANEX) 220 MCG/INH inhaler Inhale 2 puffs into the lungs daily. Reported on 10/28/2015    . pravastatin (PRAVACHOL) 20 MG tablet Take 1 tablet (20 mg total) by mouth every evening. 90 tablet 3  . ranitidine (ZANTAC) 150 MG tablet Take 150 mg by mouth 2 (two) times daily.    Marland Kitchen. HYDROcodone-homatropine (HYCODAN) 5-1.5 MG/5ML syrup 1 tsp at night or every 4-6 hours if needed for cough (Patient not taking: Reported on 03/07/2016) 180 mL 0   No facility-administered medications prior to visit.      EXAM:  BP 130/76  (BP Location: Right Arm, Patient Position: Sitting, Cuff Size: Normal)   Pulse 72   Temp 98.6 F (37 C) (Oral)   Wt 135 lb (61.2 kg)   LMP 12/09/2013   SpO2 98%   BMI 25.09 kg/m   Body mass index is 25.09 kg/m.  GENERAL: vitals reviewed and listed above, alert, oriented, appears well hydrated and in no acute distress mild  hoarse no stridor mild congestion non toxic  HEENT: atraumatic, conjunctiva  clear, no obvious abnormalities on inspection of external nose and ears OP : no lesion edema or exudate  NECK: no obvious masses on inspection palpation  LUNGS: clear to auscultation bilaterally, no wheezes, rales or rhonchi, good air movement CV: HRRR, no clubbing cyanosis or  peripheral edema nl cap refill  MS: moves all extremities without noticeable focal  abnormality PSYCH: pleasant and cooperative, no obvious depression or anxiety  ASSESSMENT AND PLAN:  Discussed the following assessment and plan:  Cough, persistent  Asthma, unspecified asthma severity, uncomplicated  Post-nasal drainage - ?  Suspect  Post infectious rad and cough variant asthma on the past   Dx with methacholine challenge? By pt reports .Marland Kitchen.  See below for plan  Optimize controllers  Add ppi and nasal incs and then fu consider other eval  pulm allergy etc  -Patient advised to return or notify health care team  if symptoms worsen ,persist or new concerns arise. Total visit 25mins > 50% spent counseling and coordinating care as indicated in above note and in instructions to patient .     Patient Instructions   This acts like airway irritability and asthmatic variant flare triggered by the original viral infection. Change acid blocker back to Protonix once a day Can try adding chlorpheniramine at night to cough medicine for postnasal drainage cough. Short course of prednisone to decrease inflammation in the respiratory tract and decrease cough. Change your cortisone inhaler to a combination bronchodilator  cortisone inhaler. Add back Flonase nasal cortisone to take every day for suppression of drainage and cough. Please do this for about 6 weeks then plan follow-up visit to decide on next step. Consider seeing pulmonary doctors but it is possible that if you get on your maximum airway suppression treatment when you get a head cold  chest cold it may prevent prolonged cough. Of course avoid respiratory irritants such as chemicals and tobacco smoke.    Neta Mends. Panosh M.D.

## 2016-03-07 ENCOUNTER — Ambulatory Visit (INDEPENDENT_AMBULATORY_CARE_PROVIDER_SITE_OTHER): Payer: BLUE CROSS/BLUE SHIELD | Admitting: Internal Medicine

## 2016-03-07 ENCOUNTER — Encounter: Payer: Self-pay | Admitting: Internal Medicine

## 2016-03-07 VITALS — BP 130/76 | HR 72 | Temp 98.6°F | Wt 135.0 lb

## 2016-03-07 DIAGNOSIS — J329 Chronic sinusitis, unspecified: Secondary | ICD-10-CM

## 2016-03-07 DIAGNOSIS — J45909 Unspecified asthma, uncomplicated: Secondary | ICD-10-CM

## 2016-03-07 DIAGNOSIS — R053 Chronic cough: Secondary | ICD-10-CM

## 2016-03-07 DIAGNOSIS — R0982 Postnasal drip: Secondary | ICD-10-CM

## 2016-03-07 DIAGNOSIS — R05 Cough: Secondary | ICD-10-CM | POA: Diagnosis not present

## 2016-03-07 MED ORDER — PANTOPRAZOLE SODIUM 40 MG PO TBEC: 40.0000 mg | DELAYED_RELEASE_TABLET | Freq: Every day | ORAL | 3 refills | Status: DC

## 2016-03-07 MED ORDER — PREDNISONE 20 MG PO TABS: ORAL_TABLET | ORAL | 0 refills | Status: DC

## 2016-03-07 MED ORDER — FLUTICASONE FUROATE-VILANTEROL 100-25 MCG/INH IN AEPB: 1.0000 | INHALATION_SPRAY | Freq: Every day | RESPIRATORY_TRACT | 5 refills | Status: DC

## 2016-03-07 NOTE — Patient Instructions (Signed)
  This acts like airway irritability and asthmatic variant flare triggered by the original viral infection. Change acid blocker back to Protonix once a day Can try adding chlorpheniramine at night to cough medicine for postnasal drainage cough. Short course of prednisone to decrease inflammation in the respiratory tract and decrease cough. Change your cortisone inhaler to a combination bronchodilator cortisone inhaler. Add back Flonase nasal cortisone to take every day for suppression of drainage and cough. Please do this for about 6 weeks then plan follow-up visit to decide on next step. Consider seeing pulmonary doctors but it is possible that if you get on your maximum airway suppression treatment when you get a head cold chest cold it may prevent prolonged cough. Of course avoid respiratory irritants such as chemicals and tobacco smoke.

## 2016-03-25 ENCOUNTER — Encounter: Payer: Self-pay | Admitting: Internal Medicine

## 2016-03-27 ENCOUNTER — Encounter: Payer: Self-pay | Admitting: Family Medicine

## 2016-03-27 ENCOUNTER — Ambulatory Visit (INDEPENDENT_AMBULATORY_CARE_PROVIDER_SITE_OTHER): Payer: BLUE CROSS/BLUE SHIELD | Admitting: Family Medicine

## 2016-03-27 VITALS — BP 109/70 | HR 70 | Temp 98.7°F | Ht 61.5 in | Wt 134.0 lb

## 2016-03-27 DIAGNOSIS — J40 Bronchitis, not specified as acute or chronic: Secondary | ICD-10-CM | POA: Diagnosis not present

## 2016-03-27 MED ORDER — CLARITHROMYCIN 500 MG PO TABS: 500.0000 mg | ORAL_TABLET | Freq: Two times a day (BID) | ORAL | 0 refills | Status: DC

## 2016-03-27 MED ORDER — METHYLPREDNISOLONE 4 MG PO TBPK: ORAL_TABLET | ORAL | 0 refills | Status: DC

## 2016-03-27 NOTE — Telephone Encounter (Signed)
Pt seen today by Dr Clent RidgesFry.  No further action needed.

## 2016-03-27 NOTE — Progress Notes (Signed)
   Subjective:    Patient ID: Lisa Lee, female    DOB: 03/10/1961, 55 y.o.   MRN: [id number]  HPI Here for another bout of coughing. She has seen [name] several times this summer for non-productive coughing. She has been given steroids, inhalers, antihistamines, antibiotics, and PPI's with little success. Now for the past 4 days the coughing has increased and she feesl that she is fighting an infection.    Review of Systems  Constitutional: Negative.   HENT: Negative for congestion, ear pain, postnasal drip, sinus pressure and sore throat.   Eyes: Negative.   Respiratory: Positive for cough. Negative for choking and wheezing.   Cardiovascular: Negative.        Objective:   Physical Exam  Constitutional: She appears well-developed and well-nourished.  HENT:  Right Ear: External ear normal.  Left Ear: External ear normal.  Nose: Nose normal.  Mouth/Throat: Oropharynx is clear and moist.  Eyes: Conjunctivae are normal.  Neck: No thyromegaly present.  Cardiovascular: Normal rate, regular rhythm, normal heart sounds and intact distal pulses.   Pulmonary/Chest: Effort normal and breath sounds normal. No respiratory distress. She has no wheezes. She has no rales.  Lymphadenopathy:    She has no cervical adenopathy.          Assessment & Plan:  Recurrent coughing, now with an acute URI. Treat with Biaxin and a Medrol dose pack. She will follow up with [name] in a few weeks.  Nelwyn SalisburyFRY,STEPHEN A, MD

## 2016-03-27 NOTE — Progress Notes (Signed)
Pre visit review using our clinic review tool, if applicable. No additional management support is needed unless otherwise documented below in the visit note. 

## 2016-04-14 NOTE — Progress Notes (Signed)
Chief Complaint  Patient presents with  . Follow-up    HPI: Lisa Lee 55 y.o.   Has had recurrent cough  Herew with husband  See past notes and again saw dr fry aug 29  For acute uri and cough and given biaxin a nd medrol dose pack . Steroids  "made me nuts"        Acted like a viral illness .  Doesn't think meds did any thing positive .  Now back to baseline coughing every day.and night  And still productive .clear   90 %   Times   Not a lot of sinus issues   protonix helped with this. HB  Snores  And coughing  Fits .  On going  No osa sx   Also ctm   And helped .   Initially but not so much seeming ly now .  Taking melatonin for sleep    Below is a and p of acute visit  Assessment & Plan:  Recurrent coughing, now with an acute URI. Treat with Biaxin and a Medrol dose pack. She will follow up with Dr. Fabian Sharp in a few weeks.  Nelwyn Salisbury, MD     Progress Notes by Baldemar Friday, RN at 03/27/2016 10:45 AM   Author: Baldemar Friday, RN Author Type: Registered Nurse Filed: 03/27/2016 11:53 AM  Note Status: Signed Cosign: Cosign Not Required Encounter Date: 03/27/2016  Editor: Baldemar Friday, RN (Registered Nurse)    Pre visit review using our clinic review tool, if applicable. No additional management support is needed unless otherwise documented below in the visit note.       ROS: See pertinent positives and negatives per HPI.  Past Medical History:  Diagnosis Date  . Allergic rhinitis   . Asthma   . Depression   . Diarrhea 08/09/2011   Poss ibs  Vs other  Related to stress  consdier other eval if needed. Had colonoscoopy per dr Matthias Hughs   . Hemorrhoids   . HPV in female 20  . Hypothyroidism   . Medication side effect 11/01/2011   tussionex   distrubed sleep     Family History  Problem Relation Age of Onset  . Heart disease Sister 42    cabg stent  . Arthritis Mother   . Heart disease Mother   . Heart disease Father   . Colon cancer Father   .  Pulmonary embolism Daughter   . Heart disease Sister   . Thyroid disease Sister   . Parkinson's disease Father     Social History   Social History  . Marital status: Married    Spouse name: N/A  . Number of children: N/A  . Years of education: N/A   Social History Main Topics  . Smoking status: Never Smoker  . Smokeless tobacco: Never Used  . Alcohol use 0.0 oz/week     Comment: once a month  . Drug use: No  . Sexual activity: Yes    Birth control/ protection: None   Other Topics Concern  . None   Social History Narrative    [employer]. 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  albuterol (PROAIR HFA) 108 (90 Base) MCG/ACT inhaler Inhale 2 puffs into the lungs every 6 (six) hours as needed for wheezing. 3 Inhaler 0  . aspirin EC 81 MG tablet Take 1 tablet (81 mg total) by mouth daily.    . Coenzyme Q10 (COQ10) 200 MG CAPS Take 1 capsule by mouth daily.    . Doxylamine Succinate, Sleep, (UNISOM PO) Take 1 tablet by mouth at bedtime as needed (for sleep).     Marland Kitchen. escitalopram (LEXAPRO) 20 MG tablet Take 1 tablet (20 mg total) by mouth daily. 30 tablet 5  . Estradiol (VAGIFEM) 10 MCG TABS vaginal tablet Place 1 each vaginally 2 (two) times a week.     . fluticasone furoate-vilanterol (BREO ELLIPTA) 100-25 MCG/INH AEPB Inhale 1 puff into the lungs daily. 1 each 5  . HYDROcodone-homatropine (HYCODAN) 5-1.5 MG/5ML syrup 1 tsp at night or every 4-6 hours if needed for cough 180 mL 0  . ibuprofen (ADVIL,MOTRIN) 200 MG tablet Take 400 mg by mouth every 6 (six) hours as needed for headache or mild pain.     Marland Kitchen. levothyroxine (SYNTHROID, LEVOTHROID) 75 MCG tablet Take 1 tablet (75 mcg total) by mouth daily. 90 tablet 3  . lithium carbonate (LITHOBID) 300 MG  CR tablet Take 1 tablet (300 mg total) by mouth 2 (two) times daily. 180 tablet 0  . pantoprazole (PROTONIX) 40 MG tablet Take 1 tablet (40 mg total) by mouth daily. 30 tablet 3  . pravastatin (PRAVACHOL) 20 MG tablet Take 1 tablet (20 mg total) by mouth every evening. 90 tablet 3  . clarithromycin (BIAXIN) 500 MG tablet Take 1 tablet (500 mg total) by mouth 2 (two) times daily. 20 tablet 0  . methylPREDNISolone (MEDROL DOSEPAK) 4 MG TBPK tablet As directed 21 tablet 0  . mometasone (ASMANEX) 220 MCG/INH inhaler Inhale 2 puffs into the lungs daily. Reported on 10/28/2015    . ranitidine (ZANTAC) 150 MG tablet Take 150 mg by mouth 2 (two) times daily.     No facility-administered medications prior to visit.      EXAM:  BP 134/72 (BP Location: Right Arm, Patient Position: Sitting, Cuff Size: Normal)   Temp 98.2 F (36.8 C) (Oral)   Wt 135 lb 3.2 oz (61.3 kg)   LMP 12/09/2013   BMI 25.13 kg/m   Body mass index is 25.13 kg/m.  GENERAL: vitals reviewed and listed above, alert, oriented, appears well hydrated and in no acute distress  Mild hoarseness ocass bronchial sounding cough  HEENT: atraumatic, conjunctiva  clear, no obvious abnormalities on inspection of external nose and ears tms clear  Nares mild congestionOP : no lesion edema or exudate  NECK: no obvious masses on inspection palpation  LUNGS: clear to auscultation bilaterally, no wheezes, rales or rhonchi, good air movement CV: HRRR, no clubbing cyanosis or  peripheral edema nl cap refill  MS: moves all extremities without noticeable focal  abnormality PSYCH: pleasant and cooperative, no obvious depression or anxiety  ASSESSMENT AND PLAN:  Discussed the following assessment and plan:  Cough, persistent - Plan: Ambulatory referral to Pulmonology  Hx of  asthma - pos methacholine challnege by report  - Plan: Ambulatory referral to Pulmonology  Need for prophylactic vaccination and inoculation against influenza - Plan: Flu  Vaccine QUAD 36+ mos PF IM (Fluarix & Fluzone Quad PF)  Medication management  -Patient advised to return or notify health care team  if symptoms worsen ,persist or new concerns arise.  Patient Instructions  Will arrange  a  Pulmonary consult about the cough   Consideration of allergy eval also if indicated .   Continue on the protonix  For now . And the  breo  flonase   Add singulair in the interim . Daily  For now  Prevention ofr allergy rhinitis and asthma .     Neta Mends. Alger Kerstein M.D.

## 2016-04-16 ENCOUNTER — Ambulatory Visit (INDEPENDENT_AMBULATORY_CARE_PROVIDER_SITE_OTHER): Payer: BLUE CROSS/BLUE SHIELD | Admitting: Internal Medicine

## 2016-04-16 ENCOUNTER — Encounter: Payer: Self-pay | Admitting: Internal Medicine

## 2016-04-16 VITALS — BP 134/72 | Temp 98.2°F | Wt 135.2 lb

## 2016-04-16 DIAGNOSIS — Z8709 Personal history of other diseases of the respiratory system: Secondary | ICD-10-CM

## 2016-04-16 DIAGNOSIS — R05 Cough: Secondary | ICD-10-CM | POA: Diagnosis not present

## 2016-04-16 DIAGNOSIS — Z23 Encounter for immunization: Secondary | ICD-10-CM

## 2016-04-16 DIAGNOSIS — R053 Chronic cough: Secondary | ICD-10-CM

## 2016-04-16 DIAGNOSIS — Z79899 Other long term (current) drug therapy: Secondary | ICD-10-CM | POA: Diagnosis not present

## 2016-04-16 MED ORDER — MONTELUKAST SODIUM 10 MG PO TABS
10.0000 mg | ORAL_TABLET | Freq: Every day | ORAL | 1 refills | Status: DC
Start: 2016-04-16 — End: 2016-05-14

## 2016-04-16 NOTE — Patient Instructions (Addendum)
Will arrange a  Pulmonary consult about the cough   Consideration of allergy eval also if indicated .   Continue on the protonix  For now . And the  breo  flonase   Add singulair in the interim . Daily  For now  Prevention ofr allergy rhinitis and asthma .

## 2016-04-16 NOTE — Assessment & Plan Note (Addendum)
Recurrence remains history positive methacholine challenge presumed allergic plus airway irritated and possible silent reflux. See past notes. She is now on Breo  Flonase saline Chlor-Trimeton. We'll add Singulair in the short run and get pulmonary consult. Stay on the Protonix.

## 2016-04-23 ENCOUNTER — Encounter: Payer: Self-pay | Admitting: Internal Medicine

## 2016-05-01 ENCOUNTER — Encounter: Payer: Self-pay | Admitting: Internal Medicine

## 2016-05-01 MED ORDER — PANTOPRAZOLE SODIUM 40 MG PO TBEC: 40.0000 mg | DELAYED_RELEASE_TABLET | Freq: Every day | ORAL | 1 refills | Status: DC

## 2016-05-01 MED ORDER — FLUTICASONE FUROATE-VILANTEROL 100-25 MCG/INH IN AEPB: 1.0000 | INHALATION_SPRAY | Freq: Every day | RESPIRATORY_TRACT | 1 refills | Status: DC

## 2016-05-01 NOTE — Telephone Encounter (Signed)
Ok to do 90 days of meds sa per her request.  Please let her know

## 2016-05-14 ENCOUNTER — Institutional Professional Consult (permissible substitution): Payer: BLUE CROSS/BLUE SHIELD | Admitting: Internal Medicine

## 2016-05-14 ENCOUNTER — Other Ambulatory Visit: Payer: Self-pay | Admitting: Internal Medicine

## 2016-05-16 ENCOUNTER — Encounter: Payer: Self-pay | Admitting: Internal Medicine

## 2016-05-16 ENCOUNTER — Ambulatory Visit (INDEPENDENT_AMBULATORY_CARE_PROVIDER_SITE_OTHER): Payer: BLUE CROSS/BLUE SHIELD | Admitting: Internal Medicine

## 2016-05-16 VITALS — BP 118/80 | HR 70 | Ht 61.5 in | Wt 134.0 lb

## 2016-05-16 DIAGNOSIS — R05 Cough: Secondary | ICD-10-CM | POA: Diagnosis not present

## 2016-05-16 DIAGNOSIS — R053 Chronic cough: Secondary | ICD-10-CM

## 2016-05-16 DIAGNOSIS — J45991 Cough variant asthma: Secondary | ICD-10-CM | POA: Diagnosis not present

## 2016-05-16 MED ORDER — MOMETASONE FURO-FORMOTEROL FUM 100-5 MCG/ACT IN AERO: 2.0000 | INHALATION_SPRAY | Freq: Two times a day (BID) | RESPIRATORY_TRACT | 0 refills | Status: DC

## 2016-05-16 MED ORDER — MOMETASONE FURO-FORMOTEROL FUM 100-5 MCG/ACT IN AERO: 2.0000 | INHALATION_SPRAY | Freq: Two times a day (BID) | RESPIRATORY_TRACT | 11 refills | Status: DC

## 2016-05-16 MED ORDER — ACAPELLA MISC
0 refills | Status: DC
Start: 2016-05-16 — End: 2018-09-12

## 2016-05-16 MED ORDER — FAMOTIDINE 20 MG PO TABS: ORAL_TABLET | ORAL | 11 refills | Status: DC

## 2016-05-16 NOTE — Telephone Encounter (Signed)
Sent to the pharmacy by e-scribe for 3 months.  Pt due for cpx 09/2016. 

## 2016-05-16 NOTE — Progress Notes (Signed)
Subjective:     Patient ID: Lisa Lee, female   DOB: 07/07/1961,    MRN: [id number]  HPI  [id number] [id number] NP/pediatrics   Never smoker ? Cough p ex as child but after childbirth 1991 noct cough >  Bad symptoms of cough /wheeze immediately p MCT in ? Mid 1990's > [facility] > coughing at night improved    05/16/2016 1st [facility] office visit/ [facility]   Chief Complaint  Patient presents with  . Pulmonary Consult    referred by [name] for cough.  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  singulair just started one month prior to OV   Cough is worse immediately at hs and wakes her up most nights not flaring in ams proair use minimal  asthmanex / Breo both dpi versions some better only  protonix back on it x summer 2017  Chlorpheniramine 4 mg x one at hs  Pos urinary incont/ occ gagging with severe cough which is mostly dry Fumes/ talking make it worse  Some hb/ some nasal congestion but don't necessary correlate with the cough  Not sob unless coughing   No obvious patterns in day to day or daytime variability or assoc excess/ purulent sputum or mucus plugs or hemoptysis or cp or chest tightness, subjective wheeze.   No unusual exp hx or h/o childhood pna/ asthma or knowledge of premature birth.  Sleeping ok without nocturnal  or early am exacerbation  of respiratory  c/o's or need for noct saba. Also denies any obvious fluctuation of symptoms with weather or environmental changes or other aggravating or alleviating factors except as outlined above   Current Medications, Allergies, Complete Past Medical History, Past Surgical History, Family History, and Social History were reviewed in [facility] electronic  medical record.  ROS  The following are not active complaints unless bolded sore throat, dysphagia, dental problems, itching, sneezing,  nasal congestion or excess/ purulent secretions, ear ache,   fever, chills, sweats, unintended wt loss, classically pleuritic or exertional cp,  orthopnea pnd or leg swelling, presyncope, palpitations, abdominal pain, anorexia, nausea, vomiting, diarrhea  or change in bowel or bladder habits, change in stools or urine, dysuria,hematuria,  rash, arthralgias, visual complaints, headache, numbness, weakness or ataxia or problems with walking or coordination,  change in mood/affect or memory.               Review of Systems     Objective:   Physical Exam amb wf nad  Wt Readings from Last 3 Encounters:  05/16/16 134 lb (60.8 kg)  04/16/16 135 lb 3.2 oz (61.3 kg)  03/27/16 134 lb (60.8 kg)    Vital signs reviewed   HEENT: nl dentition, turbinates, and oropharynx. Nl external ear canals without cough reflex   NECK :  without JVD/Nodes/TM/ nl carotid upstrokes bilaterally   LUNGS: no acc muscle use,  Nl contour chest which is clear to A and P bilaterally with cough and end exp     CV:  RRR  no s3 or murmur or increase in P2, no edema   ABD:  soft and nontender with nl inspiratory excursion in the supine position. No bruits or organomegaly, bowel sounds nl  MS:  Nl gait/ ext warm without deformities, calf tenderness, cyanosis or clubbing No obvious joint restrictions   SKIN: warm and dry without lesions    NEURO:  alert, approp, nl sensorium with  no motor deficits         Assessment:

## 2016-05-16 NOTE — Patient Instructions (Addendum)
Stop breo   Plan A = Automatic =  Dulera 100 Take 2 puffs first thing in am and then another 2 puffs about 12 hours later and continue protonix 40 mg Take 30-60 min before first meal of the day and add pepcid 20 mg one hour before bedtime with chlorpheniramine x 2    Work on inhaler technique:  relax and gently blow all the way out then take a nice smooth deep breath back in, triggering the inhaler at same time you start breathing in.  Hold for up to 5 seconds if you can. Blow out thru nose. Rinse and gargle with water when done     Plan B = Backup Only use your albuterol as a rescue medication to be used if you can't catch your breath by resting or doing a relaxed purse lip breathing pattern.  - The less you use it, the better it will work when you need it. - Ok to use the inhaler up to 2 puffs  every 4 hours if you must but call for appointment if use goes up over your usual need - Don't leave home without it !!  (think of it like the spare tire for your car)   For cough > mucinex dm 1200 mg or delsym 2 tsp every 12 hours and use the flutter valve as much as possible   GERD (REFLUX)  is an extremely common cause of respiratory symptoms just like yours , many times with no obvious heartburn at all.    It can be treated with medication, but also with lifestyle changes including elevation of the head of your bed (ideally with 6 inch  bed blocks),  Smoking cessation, avoidance of late meals, excessive alcohol, and avoid fatty foods, chocolate, peppermint, colas, red wine, and acidic juices such as orange juice.  NO MINT OR MENTHOL PRODUCTS SO NO COUGH DROPS   USE SUGARLESS CANDY INSTEAD (Jolley ranchers or Stover's or Life Savers) or even ice chips will also do - the key is to swallow to prevent all throat clearing. NO OIL BASED VITAMINS - use powdered substitutes.   Please schedule a follow up office visit in 4 weeks, sooner if needed

## 2016-05-17 NOTE — Assessment & Plan Note (Signed)
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 Upper airway cough syndrome (previously labeled PNDS) , is  so named because it's frequently impossible to sort out how much is  CR/sinusitis with freq throat clearing (which can be related to primary GERD)   vs  causing  secondary (" extra esophageal")  GERD from wide swings in gastric pressure that occur with throat clearing, often  promoting self use of mint and menthol lozenges that reduce the lower esophageal sphincter tone and exacerbate the problem further in a cyclical fashion.   These are the same pts (now being labeled as having "irritable larynx syndrome" by some cough centers) who not infrequently have a history of having failed to tolerate ace inhibitors,  dry powder inhalers or biphosphonates or report having atypical/extraesophageal reflux symptoms that don't respond to standard doses of PPI  and are easily confused as having aecopd or asthma flares by even experienced allergists/ pulmonologists (myself included).   rec flutter/ increase h1/ max doses of delsym or mucinex dm but avoid narcs if possible except for severe flares

## 2016-05-17 NOTE — Assessment & Plan Note (Addendum)
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 05/16/2016  After extensive coaching HFA effectiveness =    90% so rec Trial off breo and on dulera 100 2bid  DDX of  difficult airways management almost all start with A and  include Adherence, Ace Inhibitors, Acid Reflux, Active Sinus Disease, Alpha 1 Antitripsin deficiency, Anxiety masquerading as Airways dz,  ABPA,  Allergy(esp in young), Aspiration (esp in elderly), Adverse effects of meds,  Active smokers, A bunch of PE's (a small clot burden can't cause this syndrome unless there is already severe underlying pulm or vascular dz with poor reserve) plus two Bs  = Bronchiectasis and Beta blocker use..and one C= CHF    Adherence is always the initial "prime suspect" and is a multilayered concern that requires a "trust but verify" approach in every patient - starting with knowing how to use medications, especially inhalers, correctly, keeping up with refills and understanding the fundamental difference between maintenance and prns vs those medications only taken for a very short course and then stopped and not refilled.  - - The proper method of use, as well as anticipated side effects, of a metered-dose inhaler are discussed and demonstrated to the patient. Improved effectiveness after extensive coaching during this visit to a level of approximately 90 % from a baseline of 75 %    ? Adverse effects of dpi > change to low dose ICS  ? Allergy component > consider feno/allergy profile next ov but flares are more assoc with uri's so unlikely this will be pos - for now continue flonase/ singulair   ? Active sinus dz > already on flonase/ consider Sinus CT next   ? Acid (or non-acid) GERD > always difficult to exclude as up to 75% of pts in some series report no assoc GI/ Heartburn symptoms> rec continue max (24h)  acid suppression and diet restrictions/ reviewed      Total time devoted to counseling  = 35/9846m review case with pt/ discussion of options/alternatives/  personally creating written instructions  in presence of pt  then going over those specific  Instructions directly with the pt including how to use all of the meds but in particular covering each new medication in detail and the difference between the maintenance/automatic meds and the prns using an action plan format for the latter.

## 2016-05-24 ENCOUNTER — Encounter: Payer: Self-pay | Admitting: Internal Medicine

## 2016-05-24 ENCOUNTER — Ambulatory Visit (INDEPENDENT_AMBULATORY_CARE_PROVIDER_SITE_OTHER): Payer: BLUE CROSS/BLUE SHIELD | Admitting: Internal Medicine

## 2016-05-24 ENCOUNTER — Other Ambulatory Visit (INDEPENDENT_AMBULATORY_CARE_PROVIDER_SITE_OTHER): Payer: BLUE CROSS/BLUE SHIELD

## 2016-05-24 VITALS — BP 108/62 | HR 69 | Ht 61.5 in | Wt 133.6 lb

## 2016-05-24 DIAGNOSIS — J45991 Cough variant asthma: Secondary | ICD-10-CM | POA: Diagnosis not present

## 2016-05-24 DIAGNOSIS — R05 Cough: Secondary | ICD-10-CM | POA: Diagnosis not present

## 2016-05-24 DIAGNOSIS — R053 Chronic cough: Secondary | ICD-10-CM

## 2016-05-24 LAB — CBC WITH DIFFERENTIAL/PLATELET
BASOS PCT: 0.5 % (ref 0.0–3.0)
Basophils Absolute: 0.1 10*3/uL (ref 0.0–0.1)
EOS ABS: 0.4 10*3/uL (ref 0.0–0.7)
EOS PCT: 3.9 % (ref 0.0–5.0)
HEMATOCRIT: 41.6 % (ref 36.0–46.0)
HEMOGLOBIN: 14 g/dL (ref 12.0–15.0)
Lymphocytes Relative: 24 % (ref 12.0–46.0)
Lymphs Abs: 2.5 10*3/uL (ref 0.7–4.0)
MCHC: 33.7 g/dL (ref 30.0–36.0)
MCV: 87.9 fl (ref 78.0–100.0)
MONOS PCT: 7.5 % (ref 3.0–12.0)
Monocytes Absolute: 0.8 10*3/uL (ref 0.1–1.0)
NEUTROS ABS: 6.8 10*3/uL (ref 1.4–7.7)
Neutrophils Relative %: 64.1 % (ref 43.0–77.0)
PLATELETS: 295 10*3/uL (ref 150.0–400.0)
RBC: 4.73 Mil/uL (ref 3.87–5.11)
RDW: 12.2 % (ref 11.5–15.5)
WBC: 10.6 10*3/uL — AB (ref 4.0–10.5)

## 2016-05-24 LAB — NITRIC OXIDE: Nitric Oxide: 30

## 2016-05-24 MED ORDER — METHYLPREDNISOLONE ACETATE 80 MG/ML IJ SUSP
120.0000 mg | Freq: Once | INTRAMUSCULAR | Status: AC
  Administered 2016-05-24: 120 mg via INTRAMUSCULAR

## 2016-05-24 MED ORDER — CEFDINIR 300 MG PO CAPS: 300.0000 mg | ORAL_CAPSULE | Freq: Two times a day (BID) | ORAL | 0 refills | Status: DC

## 2016-05-24 NOTE — Patient Instructions (Addendum)
For drainage / throat tickle try take CHLORPHENIRAMINE  4 mg - take one every 4 hours as needed - available over the counter- may cause drowsiness so start with just a bedtime one dose or two and see how you tolerate it before trying in daytime    Work on inhaler technique:  relax and gently blow all the way out then take a nice smooth deep breath back in, triggering the inhaler at same time you start breathing in.  Hold for up to 5 seconds if you can. Blow out thru nose. Rinse and gargle with water when done  Suppress cough as much as possible with the hycodan when you can afford to be sleepy When you can't's your best bet is mucinex dm 1200 mg every 12 hours and use the flutter valve as much as possible  omnicef 300 mg twice daily x 10 days   depomedrol 120 mg IM today  Please remember to go to the lab department downstairs for your tests - we will call you with the results when they are available.  Keep previous appt

## 2016-05-24 NOTE — Progress Notes (Signed)
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 Cough p ex as child but after childbirth 1991 noct cough >  Bad symptoms of cough /wheeze immediately p MCT in ? Mid 1990's > [facility] Dr [facility] [address] rx ICS > coughing at night improved    History of Present Illness  05/16/2016 1st [facility] office visit/ Lisa Lee  Cough x 7 years  Chief Complaint  Patient presents with  . Pulmonary Consult    referred by [name] for cough.  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 started one month prior to OV   Cough is worse immediately at hs and wakes her up most nights not flaring in ams proair use minimal  asthmanex / Breo both dpi versions some better only  protonix back on it x summer 2017  Chlorpheniramine 4 mg x one at hs  Pos urinary incont/ occ gagging with severe cough fits  Fumes/ talking make it worse  Some hb/ some nasal congestion but don't necessary correlate with the cough  Not sob unless coughing  rec Stop breo  Plan A = Automatic =  Dulera 100 Take 2 puffs first thing in am and then another 2 puffs about 12 hours later and continue protonix 40 mg Take 30-60 min before first meal of the day and add pepcid 20 mg one hour before bedtime with chlorpheniramine x 2  Work on inhaler technique:  relax and gently blow all the way out then take a nice smooth deep breath back in, triggering the inhaler at same time you start breathing in.  Hold for up to 5 seconds if you can. Blow out thru nose. Rinse and gargle with water when done Plan B = Backup Only use your albuterol as a rescue medication For cough > mucinex  dm 1200 mg or delsym 2 tsp every 12 hours and use the flutter valve as much as possible  GERD diet     05/24/2016 acute extended ov/Lisa Lee re: flare in cough present since 2010 ?  cough variant asthma / dulera 100 2bid poor hfa  Chief Complaint  Patient presents with  . Acute Visit    Cough not improving. She is occ coughing up some green sputum.   only one chlorpheniramine at hs - has not tried in daytime  Was some better on dulera 100 2bid then took a turn for the worse 05/20/16 and cough changed to productive green mucus,  no better with mucinex dm/ flutter (not really using well)   No obvious day to day or daytime variability or assoc sob  or cp or chest tightness, subjective wheeze or overt sinus or hb symptoms. No unusual exp hx or h/o childhood pna/ asthma or knowledge of premature birth.  Sleeping ok without nocturnal  or early am exacerbation  of respiratory  c/o's or need for noct saba. Also denies any obvious fluctuation of symptoms with weather or environmental changes or other aggravating or alleviating factors except as outlined above   Current Medications, Allergies, Complete Past Medical History, Past Surgical History, Family History,  and Social History were reviewed in [employer] record.  ROS  The following are not active complaints unless bolded sore throat, dysphagia, dental problems, itching, sneezing,  nasal congestion or excess/ purulent secretions, ear ache,   fever, chills, sweats, unintended wt loss, classically pleuritic or exertional cp, hemoptysis,  orthopnea pnd or leg swelling, presyncope, palpitations, abdominal pain, anorexia, nausea, vomiting, diarrhea  or change in bowel or bladder habits, change in stools or urine, dysuria,hematuria,  rash, arthralgias, visual complaints, headache, numbness, weakness or ataxia or problems with walking or coordination,  change in mood/affect or memory.               Objective:   Physical Exam amb  wf nad   05/24/2016       05/16/16 134 lb (60.8 kg)  04/16/16 135 lb 3.2 oz (61.3 kg)  03/27/16 134 lb (60.8 kg)    Vital signs reviewed - Note on arrival 02 sats  97% on RA     HEENT: nl dentition, turbinates, and oropharynx. Nl external ear canals without cough reflex   NECK :  without JVD/Nodes/TM/ nl carotid upstrokes bilaterally   LUNGS: no acc muscle use,  Nl contour chest / marked pseudwheeze better with plm/ no cough at end exp now  CV:  RRR  no s3 or murmur or increase in P2, no edema   ABD:  soft and nontender with nl inspiratory excursion in the supine position. No bruits or organomegaly, bowel sounds nl  MS:  Nl gait/ ext warm without deformities, calf tenderness, cyanosis or clubbing No obvious joint restrictions   SKIN: warm and dry without lesions    NEURO:  alert, approp, nl sensorium with  no motor deficits      Labs ordered 05/24/2016  Allergy profile       Assessment:

## 2016-05-24 NOTE — Assessment & Plan Note (Addendum)
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 /  IgE    Based on the feno and exam (no longer wheeze/ cough on exp) the asthmatic component of the cough is better and developing more of a pattern of uacs/ cyclical cough now so do not rec higher dose of ics at this point   - The proper method of use, as well as anticipated side effects, of a metered-dose inhaler are discussed and demonstrated to the patient. Improved effectiveness after extensive coaching during this visit to a level of approximately 75 % from a baseline of 50  %  Ok for depomedrol 120 mg IM x one trial basis  but no further steroids indicated here

## 2016-05-24 NOTE — Assessment & Plan Note (Addendum)
Onset after childbirth 1991 - flutter valve rx  05/16/2016 >>>  Upper airway cough syndrome (previously labeled PNDS) , is  so named because it's frequently impossible to sort out how much is  CR/sinusitis with freq throat clearing (which can be related to primary GERD)   vs  causing  secondary (" extra esophageal")  GERD from wide swings in gastric pressure that occur with throat clearing, often  promoting self use of mint and menthol lozenges that reduce the lower esophageal sphincter tone and exacerbate the problem further in a cyclical fashion.   These are the same pts (now being labeled as having "irritable larynx syndrome" by some cough centers) who not infrequently have a history of having failed to tolerate ace inhibitors,  dry powder inhalers or biphosphonates or report having atypical/extraesophageal reflux symptoms that don't respond to standard doses of PPI  and are easily confused as having aecopd or asthma flares by even experienced allergists/ pulmonologists (myself included).    rec max rx for gerd/ cyclical cough/ omnicef x 10 days for possible sinusitis then consider hrct chest/ sinus if remains refractory  I had an extended discussion with the patient reviewing all relevant studies completed to date and  lasting 15 to 20 minutes of a 25 minute visit    Each maintenance medication was reviewed in detail including most importantly the difference between maintenance and prns and under what circumstances the prns are to be triggered using an action plan format that is not reflected in the computer generated alphabetically organized AVS.    Please see instructions for details which were reviewed in writing and the patient given a copy highlighting the part that I personally wrote and discussed at today's ov.

## 2016-05-25 LAB — RESPIRATORY ALLERGY PROFILE REGION II ~~LOC~~
Allergen, A. alternata, m6: <0.1 kU/L
Allergen, C. Herbarum, M2: <0.1 kU/L
Allergen, D pternoyssinus,d7: <0.1 kU/L
Allergen, Mulberry, t76: <0.1 kU/L
Aspergillus fumigatus, m3: <0.1 kU/L
Box Elder IgE: <0.1 kU/L
Cockroach: <0.1 kU/L
D. farinae: <0.1 kU/L
IGE (IMMUNOGLOBULIN E), SERUM: 34 kU/L (ref <115)
Johnson Grass: <0.1 kU/L
Pecan/Hickory Tree IgE: <0.1 kU/L
Rough Pigweed  IgE: <0.1 kU/L
Sheep Sorrel IgE: <0.1 kU/L
Timothy Grass: <0.1 kU/L

## 2016-05-28 NOTE — Progress Notes (Signed)
Spoke with pt and notified of results per Dr. Wert. Pt verbalized understanding and denied any questions. 

## 2016-05-31 ENCOUNTER — Encounter: Payer: Self-pay | Admitting: Internal Medicine

## 2016-05-31 NOTE — Telephone Encounter (Addendum)
Dr Sherene SiresWert please review the email exchange below and advise recs thanks!  Hi Amish Mintzer. I am using max strength Mucinex DM. The hycodan was prescribed by my PCP who referred me. I am still on the [facility] and all the other stuff. He knows about the hycodan and said he would try something else if needed. Please- I just need one night of sleep. My next appointment is the 13th.   ----- Message ----- From: CMA Jearld AdjutantLeslie M R Sent: 05/31/16, 2:13 PM To: Keyra D. Conchar-Mabe Subject: RE: Visit Follow-Up Question  Hi Azusena,  I see that Dr Sherene SiresWert had recommended that you take Mucinex otc for the cough. Have you been using this? I do not see that he ever recommended Hydromet, this must have been given by a different provider. Either way, if you are not improving on current regimen, I recommend that you call for a sooner appointment. Hope you feel better soon!  ----- Message -----    From: Jalene Mulletathryn D. Conchar-Mabe    Sent: 05/31/2016  1:28 PM EDT      To: Sandrea HughsMichael Wert, MD Subject: Visit Follow-Up Question  Dr. Sherene SiresWert, I am still battling the productive cough despite using everything you gave me. The night cough is the worst and I feel exhausted. I was wondering if I could change the hydromet to tussionex? That has worked well for me in the past and I'm will to try anything to get some rest.  What do you think? Shenetta   Here are the last ov recs:                    For drainage / throat tickle try take CHLORPHENIRAMINE 4 mg - take one every 4 hours as needed - available over the counter- may cause drowsiness so start with just a bedtime one dose or two and see how you tolerate it before trying in daytime  Work on inhaler technique: relax and gently blow all the way out then take a nice smooth deep breath back in, triggering the inhaler at same time you start breathing in. Hold for up to 5 seconds if you can. Blow out thru nose. Rinse and gargle with water when done  Suppress cough as much as  possible with the hycodan when you can afford to be sleepy When you can't's your best bet is mucinex dm 1200 mg every 12 hours and use the flutter valve as much as possible  omnicef 300 mg twice daily x 10 days

## 2016-05-31 NOTE — Telephone Encounter (Signed)
Called spoke with patient to schedule appt with MW.  Pt voiced her understanding but is unable to schedule appt at this time and asked to call back tomorrow 11.3.17 or Monday 11.6.17.  Advised pt that the ladies that answer the phone will be able to schedule the appt for her.  She is aware to bring all meds to appt.  Nothing further needed at this time; will sign off

## 2016-05-31 NOTE — Telephone Encounter (Signed)
Nothing stronger over the phone, ok to move up f/u with all meds in hand, me or Tammy NP

## 2016-06-04 ENCOUNTER — Encounter: Payer: Self-pay | Admitting: Internal Medicine

## 2016-06-04 NOTE — Telephone Encounter (Signed)
Add on to 06/07/16 pm with all meds in hand

## 2016-06-04 NOTE — Telephone Encounter (Signed)
Spoke with the pt and scheduled ov for 06/07/16 at 4:15 and advised her to bring all meds  Nothing further needed per pt

## 2016-06-07 ENCOUNTER — Encounter: Payer: Self-pay | Admitting: Internal Medicine

## 2016-06-07 ENCOUNTER — Ambulatory Visit (INDEPENDENT_AMBULATORY_CARE_PROVIDER_SITE_OTHER): Payer: BLUE CROSS/BLUE SHIELD | Admitting: Internal Medicine

## 2016-06-07 VITALS — BP 114/72 | HR 73 | Ht 60.25 in | Wt 133.0 lb

## 2016-06-07 DIAGNOSIS — R05 Cough: Secondary | ICD-10-CM

## 2016-06-07 DIAGNOSIS — R053 Chronic cough: Secondary | ICD-10-CM

## 2016-06-07 DIAGNOSIS — J45991 Cough variant asthma: Secondary | ICD-10-CM | POA: Diagnosis not present

## 2016-06-07 MED ORDER — HYDROCODONE-ACETAMINOPHEN 5-325 MG PO TABS: 1.0000 | ORAL_TABLET | ORAL | 0 refills | Status: DC | PRN

## 2016-06-07 NOTE — Progress Notes (Signed)
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 Cough p ex as child but after childbirth 1991 noct cough >  Bad symptoms of cough /wheeze immediately p MCT in ? Mid 1990's > eval by Dr Maple [facility]Young on [facility] [address] rx ICS > coughing at night improved      History of Present Illness  05/16/2016 1st [facility] office visit/ [name]  Cough x 7 years  Chief Complaint  Patient presents with  . Pulmonary Consult    referred by [name] for cough.  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 started one month prior to OV   Cough is worse immediately at hs and wakes her up most nights not flaring in ams proair use minimal  asthmanex / Breo both dpi versions some better only  protonix back on it x summer 2017  Chlorpheniramine 4 mg x one at hs  Pos urinary incont/ occ gagging with severe cough fits  Fumes/ talking make it worse  Some hb/ some nasal congestion but don't necessary correlate with the cough  Not sob unless coughing  rec Stop breo  Plan A = Automatic =  Dulera 100 Take 2 puffs first thing in am and then another 2 puffs about 12 hours later and continue protonix 40 mg Take 30-60 min before first meal of the day and add pepcid 20 mg one hour before bedtime with chlorpheniramine x 2  Work on inhaler technique:    Plan B = Backup Only use your albuterol as a rescue medication For cough > mucinex dm 1200 mg or delsym 2 tsp every 12 hours and use the flutter valve as much as possible  GERD diet    05/24/2016 acute extended ov/[name] re: flare in cough present since 2010 ?  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 Acute Visit    Cough not improving. She is occ coughing up some green sputum.   only one chlorpheniramine at hs - has not tried in daytime  Was some better on dulera 100 2bid then took a turn for the worse 05/20/16 and cough changed to productive green mucus,  no better with mucinex dm/ flutter (not really using well)  rec For drainage / throat tickle try take CHLORPHENIRAMINE  4 mg - take one every 4 hours as needed -   Work on inhaler technique:    suppress cough as much as possible with the hycodan when you can afford to be sleepy When you can't's your best bet is mucinex dm 1200 mg every 12 hours and use the flutter valve as much as possible omnicef 300 mg twice daily x 10 days  depomedrol 120 mg IM today     06/07/2016  f/u ov/[name] re: cough  x 7 y with prev Pos MCT/ maint rx dulera 100 2bid and singulair  Chief Complaint  Patient presents with  . Acute Visit    Cough still bothering her. She states she is coughing some less. Still coughing up clear sputum.  Not able to adhere to rx for cyclical cough/ called requesting tussionex/ denied / mucus remains clear and no worse first thing in am than the rest of the day  And turned from green to white p rx with omnicef   No obvious day to day or daytime variability or assoc sob  or cp or chest tightness, subjective wheeze or overt sinus or hb symptoms. No unusual exp hx or h/o childhood pna/ asthma or knowledge of premature birth.  Sleeping ok without nocturnal  or early am exacerbation  of respiratory  c/o's or need for noct saba. Also denies any obvious fluctuation of symptoms with weather or environmental changes or other aggravating or alleviating factors except as outlined above   Current Medications, Allergies, Complete Past Medical History, Past Surgical History, Family History, and Social History were reviewed in [facility] electronic medical record.  ROS  The following are not  active complaints unless bolded sore throat, dysphagia, dental problems, itching, sneezing,  nasal congestion or excess/ purulent secretions, ear ache,   fever, chills, sweats, unintended wt loss, classically pleuritic or exertional cp, hemoptysis,  orthopnea pnd or leg swelling, presyncope, palpitations, abdominal pain, anorexia, nausea, vomiting, diarrhea  or change in bowel or bladder habits, change in stools or urine, dysuria,hematuria,  rash, arthralgias, visual complaints, headache, numbness, weakness or ataxia or problems with walking or coordination,  change in mood/affect or memory.               Objective:   Physical Exam    amb wf nad harsh  Barking quality upper airway cough    06/07/2016        133 05/16/16 134 lb (60.8 kg)  04/16/16 135 lb 3.2 oz (61.3 kg)  03/27/16 134 lb (60.8 kg)    Vital signs reviewed - Note on arrival 02 sats  97% on RA     HEENT: nl dentition, turbinates, and oropharynx. Nl external ear canals without cough reflex   NECK :  without JVD/Nodes/TM/ nl carotid upstrokes bilaterally   LUNGS: no acc muscle use,  Nl contour chest  Perfectly clear bilaterally with plm   CV:  RRR  no s3 or murmur or increase in P2, no edema   ABD:  soft and nontender with nl inspiratory excursion in the supine position. No bruits or organomegaly, bowel sounds nl  MS:  Nl gait/ ext warm without deformities, calf tenderness, cyanosis or clubbing No obvious joint restrictions   SKIN: warm and dry without lesions    NEURO:  alert, approp, nl sensorium with  no motor deficits           Assessment:

## 2016-06-07 NOTE — Patient Instructions (Addendum)
Once you get the depomerol 120 mg IM >> goal is 100% cough suppression x 3 days: Take delsym two tsp every 12 hours and supplement if needed with vicodin up to 2 every 4 hours to suppress the urge to cough. Swallowing water or using ice chips/non mint and menthol containing candies (such as lifesavers or sugarless jolly ranchers) are also effective.  You should rest your voice and avoid activities that you know make you cough.  Once you have eliminated the cough for 3 straight days try reducing the vicodin  first,  then the delsym as tolerated.    We will call you 11/10 to arrange sinus CT and high resolution chest CT and arrange follow up accordingly

## 2016-06-08 ENCOUNTER — Telehealth: Payer: Self-pay | Admitting: Internal Medicine

## 2016-06-08 ENCOUNTER — Encounter: Payer: Self-pay | Admitting: Family Medicine

## 2016-06-08 NOTE — Telephone Encounter (Signed)
Should have been on whatever day she was starting the heavy cough suppression so if that's Monday 11/13 then that's when she needs to be seen

## 2016-06-08 NOTE — Assessment & Plan Note (Signed)
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

## 2016-06-08 NOTE — Telephone Encounter (Signed)
appt moved to Monday, pt aware.  Nothing further needed.

## 2016-06-08 NOTE — Telephone Encounter (Signed)
Pt had OV with MW on 06-08-16. Pt states she was told to start cough syrup & Vicodin on Monday 06-11-16 and was to get her depo the same day.  Pt is scheduled on 06-12-16 to have depo injection but is concerned that isn't correct.  MW please advise if it's okay to keep 11-14 appt for depo injection. Thanks.

## 2016-06-08 NOTE — Assessment & Plan Note (Addendum)
Lack of cough resolution on a verified empirical regimen could mean an alternative diagnosis (uacs/irritable larynx for which rx is trial of gabapentin/discussed with pt), persistence of the disease state (eg sinusitis or bronchiectasis which we have note excluded but need to do now) , or inadequacy of currently available therapy (eg no medical rx available for non-acid gerd  In pt who hasn't stopped coughing in 7 years)  For now will focus on eliminating cyclical  Cough and proceed with w/u with ct's as per avs    I had an extended discussion with the patient reviewing all relevant studies completed to date and  lasting 25 minutes of a 40  minute visit  Focusing on refractory cough management options/final recs that will fit her work schedule   Each maintenance medication was reviewed in detail including most importantly the difference between maintenance and prns and under what circumstances the prns are to be triggered using an action plan format that is not reflected in the computer generated alphabetically organized AVS.    Please see instructions for details which were reviewed in writing and the patient given a copy highlighting the part that I personally wrote and discussed at today's ov.

## 2016-06-11 ENCOUNTER — Ambulatory Visit: Payer: BLUE CROSS/BLUE SHIELD | Admitting: Internal Medicine

## 2016-06-11 DIAGNOSIS — R05 Cough: Secondary | ICD-10-CM

## 2016-06-11 DIAGNOSIS — R053 Chronic cough: Secondary | ICD-10-CM

## 2016-06-11 MED ORDER — METHYLPREDNISOLONE ACETATE 80 MG/ML IJ SUSP
120.0000 mg | Freq: Once | INTRAMUSCULAR | Status: AC
  Administered 2016-06-11: 120 mg via INTRAMUSCULAR

## 2016-06-12 ENCOUNTER — Ambulatory Visit: Payer: BLUE CROSS/BLUE SHIELD | Admitting: Internal Medicine

## 2016-06-12 ENCOUNTER — Ambulatory Visit (INDEPENDENT_AMBULATORY_CARE_PROVIDER_SITE_OTHER)
Admission: RE | Admit: 2016-06-12 | Discharge: 2016-06-12 | Disposition: A | Discharge status: Home / Self Care | Payer: BLUE CROSS/BLUE SHIELD | Source: Ambulatory Visit | Attending: Internal Medicine | Admitting: Internal Medicine

## 2016-06-12 DIAGNOSIS — J45991 Cough variant asthma: Secondary | ICD-10-CM

## 2016-06-13 ENCOUNTER — Encounter: Payer: Self-pay | Admitting: Cardiology

## 2016-06-13 NOTE — Progress Notes (Signed)
Spoke with pt and notified of results per Dr. Wert. Pt verbalized understanding and denied any questions. 

## 2016-06-29 ENCOUNTER — Encounter: Payer: Self-pay | Admitting: Internal Medicine

## 2016-07-09 ENCOUNTER — Encounter: Payer: Self-pay | Admitting: Internal Medicine

## 2016-07-09 NOTE — Telephone Encounter (Signed)
Please see patient email and advise on refills request and follow up appt.  Patient is requesting that you refill her Montelukast which was originally being filled by Dr Fabian SharpPanosh.  Upon your response the [facility] and the Famotidine will be sent in as well as you are already prescribing these.   ----- Message -----  From: Lisa Lee  Sent: 07/09/2016 12:45 PM EST  To: Sandrea HughsMichael Wert, MD Subject: Non-Urgent Medical Question  I would like to request that 3 of my medications be sent to [facility] for further refills.  Dulera inhaler Montelukast 10 mg tab daily Famotidine 20 mg tablet daily.   [name] was the original prescriber for the Montelukast, but since Dr. Sherene SiresWert is managing my asthma now, it seems reasonable that he manage my medications.  Lastly, I never heard back on whether Dr. Sherene SiresWert wants to see in a follow up visit.  Thanks, [employer]

## 2016-07-09 NOTE — Telephone Encounter (Signed)
Ok to refill with 3 month supply x one year as long as feels she's doing better - if not it doesn't make any sense to do a 3 months on anything at this point as we may change the rx at the next ov which should happen in 6 weeks

## 2016-07-10 NOTE — Telephone Encounter (Signed)
MW  Please see pt. email that was sent below and well as your previous message. An appointment was made for the pt. In January. She wanted to make you aware of how she is currently doing  Lisa Lee  to Nyoka CowdenMichael B Wert, MD       12:47 AM  Please tell Dr. Sherene SiresWert that I am doing fair. I have chest congestion again for about that last 10 days or so. I am using the tools and meds he has given/taught me so the coughing isn't as bad as in the past. I have a few Vicodin left over and have to use them on occasion.  I thought about coming in to be seen but was hesitant to do as no one contacted me when I asked about the need for follow up after the testing and 3 day trial last month. I'm kind of lost about who to turn to.    Nyoka CowdenMichael B Wert, MD      5:27 PM  Note    Ok to refill with 3 month supply x one year as long as feels she's doing better - if not it doesn't make any sense to do a 3 months on anything at this point as we may change the rx at the next ov which should happen in 6 weeks

## 2016-07-10 NOTE — Telephone Encounter (Signed)
Emailed the pt. Back asking her to give our office a call to go over Dr. Thurston HoleWert's message, seeing that she made need an appointment. His message is below  Telephone Encounter Encounter Date: 07/09/2016 5:27 PM Nyoka CowdenMichael B Wert, MD  Pulmonary Disease    [] Hide copied text [] Hover for attribution information Ok to refill with 3 month supply x one year as long as feels she's doing better - if not it doesn't make any sense to do a 3 months on anything at this point as we may change the rx at the next ov which should happen in 6 weeks     Electronically signed by Nyoka CowdenMichael B Wert, MD at 07/09/2016 5:29 PM    [name], CMA    [] Hide copied text Please see patient email and advise on refills request and follow up appt.  Patient is requesting that you refill her Montelukast which was originally being filled by Dr Fabian SharpPanosh.  [facility] your [facility] the F[facility] will be sent in as well as you are already prescribing these.   ----- Message -----  From: Lisa Lee  Sent: 07/09/2016 12:45 PM EST  To: Sandrea HughsMichael Wert, MD Subject: Non-Urgent Medical Question  I would like to request that 3 of my medications be sent to [facility] for further refills.  Dulera inhaler Montelukast 10 mg tab daily Famotidine 20 mg tablet daily.   [name] was the original prescriber for the Montelukast, but since Dr. Sherene SiresWert is managing my asthma now, it seems reasonable that he manage my medications.  Lastly, I never heard back on whether Dr. Sherene SiresWert wants to see in a follow up visit.  Thanks, [employer]

## 2016-07-10 NOTE — Telephone Encounter (Signed)
The scans were neg but we should see her back in office if not 100% improved to her satisfaction   If needs to return for this then bring all active meds including otcs

## 2016-07-11 ENCOUNTER — Other Ambulatory Visit: Payer: Self-pay | Admitting: Internal Medicine

## 2016-07-11 MED ORDER — MONTELUKAST SODIUM 10 MG PO TABS: 10.0000 mg | ORAL_TABLET | Freq: Every day | ORAL | 3 refills | Status: DC

## 2016-07-11 MED ORDER — MOMETASONE FURO-FORMOTEROL FUM 100-5 MCG/ACT IN AERO: 2.0000 | INHALATION_SPRAY | Freq: Two times a day (BID) | RESPIRATORY_TRACT | 3 refills | Status: DC

## 2016-07-11 MED ORDER — FAMOTIDINE 20 MG PO TABS: ORAL_TABLET | ORAL | 3 refills | Status: DC

## 2016-07-18 ENCOUNTER — Encounter: Payer: Self-pay | Admitting: Internal Medicine

## 2016-07-18 NOTE — Telephone Encounter (Signed)
Spoke with pt over the phone. I have moved her appointment up to 07/19/16 at 3:45pm. Nothing further was needed.

## 2016-07-19 ENCOUNTER — Encounter: Payer: Self-pay | Admitting: Internal Medicine

## 2016-07-19 ENCOUNTER — Ambulatory Visit (INDEPENDENT_AMBULATORY_CARE_PROVIDER_SITE_OTHER): Payer: BLUE CROSS/BLUE SHIELD | Admitting: Internal Medicine

## 2016-07-19 VITALS — BP 110/68 | HR 75 | Ht 61.0 in | Wt 135.8 lb

## 2016-07-19 DIAGNOSIS — R05 Cough: Secondary | ICD-10-CM

## 2016-07-19 DIAGNOSIS — J45991 Cough variant asthma: Secondary | ICD-10-CM

## 2016-07-19 DIAGNOSIS — R053 Chronic cough: Secondary | ICD-10-CM

## 2016-07-19 MED ORDER — CEFDINIR 300 MG PO CAPS: 300.0000 mg | ORAL_CAPSULE | Freq: Two times a day (BID) | ORAL | 0 refills | Status: DC

## 2016-07-19 MED ORDER — METHYLPREDNISOLONE ACETATE 80 MG/ML IJ SUSP
120.0000 mg | Freq: Once | INTRAMUSCULAR | Status: AC
  Administered 2016-07-19: 120 mg via INTRAMUSCULAR

## 2016-07-19 MED ORDER — HYDROCODONE-ACETAMINOPHEN 5-325 MG PO TABS: 1.0000 | ORAL_TABLET | ORAL | 0 refills | Status: DC | PRN

## 2016-07-19 NOTE — Progress Notes (Signed)
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 Cough p ex as child but after childbirth 1991 noct cough >  Bad symptoms of cough /wheeze immediately p MCT in ? Mid 1990's > [facility] rx ICS > coughing at night improved    History of Present Illness  05/16/2016 1st [facility] office visit/ Wert  Cough x 7 years  Chief Complaint  Patient presents with  . Pulmonary Consult    referred by [name] for cough.  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 started one month prior to OV   Cough is worse immediately at hs and wakes her up most nights not flaring in ams proair use minimal  asthmanex / Breo both dpi versions some better only  protonix back on it x summer 2017  Chlorpheniramine 4 mg x one at hs  Pos urinary incont/ occ gagging with severe cough fits  Fumes/ talking make it worse  Some hb/ some nasal congestion but don't necessary correlate with the cough  Not sob unless coughing  rec Stop breo  Plan A = Automatic =  Dulera 100 Take 2 puffs first thing in am and then another 2 puffs about 12 hours later and continue protonix 40 mg Take 30-60 min before first meal of the day and add pepcid 20 mg one hour before bedtime with chlorpheniramine x 2  Work on inhaler technique:    Plan B = Backup Only use your albuterol as a rescue medication For cough > mucinex dm 1200 mg or delsym 2 tsp every 12 hours and use the flutter valve as much as possible  GERD diet    05/24/2016 acute extended ov/Wert re: flare in cough present since 2010 ?  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 Acute Visit    Cough not improving. She is occ coughing up some green sputum.   only one chlorpheniramine at hs - has not tried in daytime  Was some better on dulera 100 2bid then took a turn for the worse 05/20/16 and cough changed to productive green mucus,  no better with mucinex dm/ flutter (not really using well)  rec For drainage / throat tickle try take CHLORPHENIRAMINE  4 mg - take one every 4 hours as needed -   Work on inhaler technique:    suppress cough as much as possible with the hycodan when you can afford to be sleepy When you can't's your best bet is mucinex dm 1200 mg every 12 hours and use the flutter valve as much as possible omnicef 300 mg twice daily x 10 days  depomedrol 120 mg IM today     06/07/2016  f/u ov/Wert re: cough  x 7 y with prev Pos MCT/ maint rx dulera 100 2bid and singulair  Chief Complaint  Patient presents with  . Acute Visit    Cough still bothering her. She states she is coughing some less. Still coughing up clear sputum.  rec Once you get the depomerol 120 mg IM >> goal is 100% cough suppression x 3 days: Take delsym two tsp every 12 hours and supplement if needed with vicodin up to 2 every 4 hours to suppress the urge to cough. Swallowing water or using ice chips/non mint and menthol containing candies (such as lifesavers or sugarless jolly ranchers) are also effective.  You should rest your voice and avoid activities that you know make you cough. Once you have eliminated the cough for 3 straight days try reducing the vicodin  first,  then the delsym as tolerated.   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 Acute Visit    Pt c/o increased cough for the past 3 wks- prod with green sputum.     best she's been in a while was after cough suppression maybe 80% then abruptly  worse after caught a cold from a contact Cough is worse at hs and early in am s sob or wheeze    No obvious day to day or daytime variability or assoc  Mucus plug/hemoptysis or cp or chest tightness, subjective wheeze or overt sinus or hb symptoms. No unusual exp hx or h/o childhood pna/ asthma or knowledge of premature birth.  Sleeping ok without nocturnal  or early am exacerbation  of respiratory  c/o's or need for noct saba. Also denies any obvious fluctuation of symptoms with weather or environmental changes or other aggravating or alleviating factors except as outlined above   Current Medications, Allergies, Complete Past Medical History, Past Surgical History, Family History, and Social History were reviewed in [facility] electronic [facility] record.  ROS  The following are not active complaints unless bolded sore throat, dysphagia, dental problems, itching, sneezing,  nasal congestion or excess/ purulent secretions, ear ache,   fever, chills, sweats, unintended wt loss, classically pleuritic or exertional cp, hemoptysis,  orthopnea pnd or leg swelling, presyncope, palpitations, abdominal pain, anorexia, nausea, vomiting, diarrhea  or change in bowel or bladder habits, change in stools or urine, dysuria,hematuria,  rash, arthralgias, visual complaints, headache, numbness, weakness or ataxia or problems with walking or coordination,  change in mood/affect or memory.               Objective:   Physical Exam    amb wf nad  upper airway cough not nearly as harsh as in past   07/19/2016      135 06/07/2016        133 05/16/16 134 lb (60.8 kg)  04/16/16 135 lb 3.2 oz (61.3 kg)  03/27/16 134 lb (60.8 kg)    Vital signs reviewed - Note on arrival 02 sats  99% on RA     HEENT: nl dentition, turbinates, and oropharynx. Nl external ear canals without cough reflex   NECK :  without JVD/Nodes/TM/ nl carotid upstrokes bilaterally   LUNGS: no acc muscle use,  Nl contour chest   Perfectly clear bilaterally with plm   CV:  RRR  no s3 or murmur or increase in P2, no edema   ABD:  soft and nontender with nl inspiratory excursion in the supine position. No bruits or organomegaly, bowel sounds nl  MS:  Nl gait/ ext warm without deformities, calf tenderness, cyanosis or clubbing No obvious joint restrictions   SKIN: warm and dry without lesions    NEURO:  alert, approp, nl  sensorium with  no motor deficits           Assessment:

## 2016-07-19 NOTE — Patient Instructions (Addendum)
Change the bedtime doses to 1 hour before bed  pepcid and chlorphenirmine   goal is 100% cough suppression x 3 days: Take delsym two tsp every 12 hours and supplement if needed with vicodin up to 2 every 4 hours to suppress the urge to cough. Swallowing water or using ice chips/non mint and menthol containing candies (such as lifesavers or sugarless jolly ranchers) are also effective.  You should rest your voice and avoid activities that you know make you cough   omnicef 300 mg twice daily x 10 days   depomedrol 120 mg IM today  After the holidays if not 100% improved you need a methacholine challenge test - call [facility] and shedule [phone]  Keep your 08/14/15 visit but bring all active medications including over the counters

## 2016-07-20 ENCOUNTER — Ambulatory Visit: Payer: BLUE CROSS/BLUE SHIELD | Admitting: Internal Medicine

## 2016-07-20 ENCOUNTER — Encounter: Payer: Self-pay | Admitting: Internal Medicine

## 2016-07-20 NOTE — Assessment & Plan Note (Signed)
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 05/16/2016  rec Trial off breo and on dulera 100 2bid - FENO 05/24/2016  =   30 on dulera 100 2bid despite poor hfa - Allergy profile 05/24/2016 >  Eos 0.4 /  IgE 34 Neg RAST   - 06/07/2016  After extensive coaching HFA effectiveness =    90%  Lack of cough resolution on a verified empirical regimen** could mean an alternative diagnosis(irritable larynx syndrome/ not cough variant asthma), persistence of the disease state (eg sinusitis or bronchiectasis- both recently excluded) , or inadequacy of currently available therapy (eg no medical rx available for non-acid gerd)   For now it appears the asthma component is not the problem, plan to do MCT p holidays to prove this if cough persists but  **cautioned pt that we need to validate 100% adherence with all listed meds from this point forward before prescribing narcotics for cough. return with all meds in hand using a trust but verify approach to confirm accurate Medication  Reconciliation The principal here is that until we are certain that the  patients are doing what we've asked, it makes no sense to ask them to do more.

## 2016-07-20 NOTE — Assessment & Plan Note (Addendum)
Onset after childbirth 1991 - flutter valve rx  05/16/2016 >>> - Sinus CT 06/12/2016 No evidence for acute or chronic paranasal sinus disease. - CT chest hrct 06/12/2016 wnl x coronary calcium    Recurrent in setting of URI but never really eliminate  in decades typical of Upper airway cough syndrome (previously labeled PNDS) , is  so named because it's frequently impossible to sort out how much is  CR/sinusitis with freq throat clearing (which can be related to primary GERD)   vs  causing  secondary (" extra esophageal")  GERD from wide swings in gastric pressure that occur with throat clearing, often  promoting self use of mint and menthol lozenges that reduce the lower esophageal sphincter tone and exacerbate the problem further in a cyclical fashion.   These are the same pts (now being labeled as having "irritable larynx syndrome" by some cough centers) who not infrequently have a history of having failed to tolerate ace inhibitors,  dry powder inhalers or biphosphonates or report having atypical/extraesophageal reflux symptoms that don't respond to standard doses of PPI  and are easily confused as having aecopd or asthma flares by even experienced allergists/ pulmonologists (myself included).   Will try rx with another round of abx/cyclical cough suppression then consider repeat MCT and trial of gabapentin  I had an extended discussion with the patient reviewing all relevant studies completed to date and  lasting 25 minutes of a 40  minute acute  visit  re  Refractory non-specific but potentially very serious pulmonary symptoms of unknown etiology.  Each maintenance medication was reviewed in detail including most importantly the difference between maintenance and prns and under what circumstances the prns are to be triggered using an action plan format that is not reflected in the computer generated alphabetically organized AVS.    Please see AVS for unique instructions that I personally wrote  and verbalized to the the pt in detail and then reviewed with pt  by my nurse highlighting any  changes in therapy recommended at today's visit to their plan of care.

## 2016-08-02 ENCOUNTER — Encounter: Payer: Self-pay | Admitting: Cardiology

## 2016-08-13 ENCOUNTER — Encounter: Payer: Self-pay | Admitting: Internal Medicine

## 2016-08-13 ENCOUNTER — Ambulatory Visit (INDEPENDENT_AMBULATORY_CARE_PROVIDER_SITE_OTHER): Payer: BLUE CROSS/BLUE SHIELD | Admitting: Internal Medicine

## 2016-08-13 VITALS — BP 112/74 | HR 83 | Ht 61.0 in | Wt 136.0 lb

## 2016-08-13 DIAGNOSIS — R053 Chronic cough: Secondary | ICD-10-CM

## 2016-08-13 DIAGNOSIS — R05 Cough: Secondary | ICD-10-CM | POA: Diagnosis not present

## 2016-08-13 DIAGNOSIS — J45991 Cough variant asthma: Secondary | ICD-10-CM | POA: Diagnosis not present

## 2016-08-13 NOTE — Patient Instructions (Addendum)
Decrease dulera to one twice daily to see if you get same control without the tremor - if cough worse then restart the 2 every 12 hour dosing   Please see patient coordinator before you leave today  to schedule Methacholine challenge with no dulera for 12 hours but everything else stay the same

## 2016-08-13 NOTE — Assessment & Plan Note (Signed)
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 05/16/2016  rec Trial off breo and on dulera 100 2bid - FENO 05/24/2016  =   30 on dulera 100 2bid despite poor hfa - Allergy profile 05/24/2016 >  Eos 0.4 /  IgE 34 Neg RAST   - 06/07/2016  After extensive coaching HFA effectiveness =    90% - Repeat MCT rec 08/13/2016 >>>   Not really clear which if any of her symptoms are related to asthma vs uacs > MCT next but since slt tremor from laba rec try dulera 100 one bid   Discussed in detail all the  indications, usual  risks and alternatives  relative to the benefits with patient who agrees to proceed with w/u as outlined   I had an extended discussion with the patient reviewing all relevant studies completed to date and  lasting 15 to 20 minutes of a 25 minute visit    Each maintenance medication was reviewed in detail including most importantly the difference between maintenance and prns and under what circumstances the prns are to be triggered using an action plan format that is not reflected in the computer generated alphabetically organized AVS.    Please see AVS for specific instructions unique to this visit that I personally wrote and verbalized to the the pt in detail and then reviewed with pt  by my nurse highlighting any  changes in therapy recommended at today's visit to their plan of care.

## 2016-08-13 NOTE — Assessment & Plan Note (Signed)
Onset after childbirth 1991 - flutter valve rx  05/16/2016 >>> - Sinus CT 06/12/2016 No evidence for acute or chronic paranasal sinus disease. - CT chest hrct 06/12/2016 wnl x coronary calcium    Still has elements clearly of Upper airway cough syndrome (previously labeled PNDS) , is  so named because it's frequently impossible to sort out how much is  CR/sinusitis with freq throat clearing (which can be related to primary GERD)   vs  causing  secondary (" extra esophageal")  GERD from wide swings in gastric pressure that occur with throat clearing, often  promoting self use of mint and menthol lozenges that reduce the lower esophageal sphincter tone and exacerbate the problem further in a cyclical fashion.   These are the same pts (now being labeled as having "irritable larynx syndrome" by some cough centers) who not infrequently have a history of having failed to tolerate ace inhibitors,  dry powder inhalers or biphosphonates or report having atypical/extraesophageal reflux symptoms that don't respond to standard doses of PPI  and are easily confused as having aecopd or asthma flares by even experienced allergists/ pulmonologists (myself included).   rec continue rx as is since the 1st gen h1 response strongly suggests pnds

## 2016-08-13 NOTE — Progress Notes (Signed)
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 Cough p ex as child but after childbirth 1991 noct cough >  Bad symptoms of cough /wheeze immediately p MCT in ? Mid 1990's > [facility] Dr Maple [facility]Young [facility] rx ICS > coughing at night improved    History of Present Illness  05/16/2016 1st [facility] office visit/ Lisa Lee  Cough x 7 years  Chief Complaint  Patient presents with  . Pulmonary Consult    referred by [name] for cough.  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 started one month prior to OV   Cough is worse immediately at hs and wakes her up most nights not flaring in ams proair use minimal  asthmanex / Breo both dpi versions some better only  protonix back on it x summer 2017  Chlorpheniramine 4 mg x one at hs  Pos urinary incont/ occ gagging with severe cough fits  Fumes/ talking make it worse  Some hb/ some nasal congestion but don't necessary correlate with the cough  Not sob unless coughing  rec Stop breo  Plan A = Automatic =  Dulera 100 Take 2 puffs first thing in am and then another 2 puffs about 12 hours later and continue protonix 40 mg Take 30-60 min before first meal of the day and add pepcid 20 mg one hour before bedtime with chlorpheniramine x 2  Work on inhaler technique:    Plan B = Backup Only use your albuterol as a rescue medication For cough > mucinex dm 1200 mg or delsym 2 tsp every 12 hours and use the flutter valve as much as possible  GERD diet    05/24/2016 acute extended ov/Lisa Lee re: flare in cough present since 2010 ?  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 Acute Visit    Cough not improving. She is occ coughing up some green sputum.   only one chlorpheniramine at hs - has not tried in daytime  Was some better on dulera 100 2bid then took a turn for the worse 05/20/16 and cough changed to productive green mucus,  no better with mucinex dm/ flutter (not really using well)  rec For drainage / throat tickle try take CHLORPHENIRAMINE  4 mg - take one every 4 hours as needed -   Work on inhaler technique:    suppress cough as much as possible with the hycodan when you can afford to be sleepy When you can't's your best bet is mucinex dm 1200 mg every 12 hours and use the flutter valve as much as possible omnicef 300 mg twice daily x 10 days  depomedrol 120 mg IM today     06/07/2016  f/u ov/Lisa Lee re: cough  x 7 y with prev Pos MCT/ maint rx dulera 100 2bid and singulair  Chief Complaint  Patient presents with  . Acute Visit    Cough still bothering her. She states she is coughing some less. Still coughing up clear sputum.  rec Once you get the depomerol 120 mg IM >> goal is 100% cough suppression x 3 days: Take delsym two tsp every 12 hours and supplement if needed with vicodin up to 2 every 4 hours to suppress the urge to cough. Swallowing water or using ice chips/non mint and menthol containing candies (such as lifesavers or sugarless jolly ranchers) are also effective.  You should rest your voice and avoid activities that you know make you cough. Once you have eliminated the cough for 3 straight days try reducing the vicodin  first,  then the delsym as tolerated.   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 Acute Visit    Pt c/o increased cough for the past 3 wks- prod with green sputum.     best she's been in a while was after cough suppression maybe 80% then abruptly  worse after caught a cold from a contact Cough is worse at hs and early in am s sob or wheeze  rec Change the bedtime doses to 1 hour before bed  pepcid and chlorphenirmine  Goal is 100% cough suppression x 3 days: Take delsym two tsp every 12 hours and supplement if needed with vicodin up to 2 every 4 hours to suppress the urge to cough. Swallowing water or using ice chips/non mint and menthol containing candies (such as lifesavers or sugarless jolly ranchers) are also effective.  You should rest your voice and avoid activities that you know make you cough  Omnicef 300 mg twice daily x 10 days  Depomedrol 120 mg IM today      08/13/2016  f/u ov/Anthony Roland re: ? Cough variant asthma vs uacs  Chief Complaint  Patient presents with  . Follow-up    Cough has improved some but not completely resolved.   saba sometimes helps the cough, sometimes does not / avg use  Cough is 90% resolved on dulera 100 2bid/ singulair/ ppi ac and  pepcid and h1 hs and during the day one or two h1's  Noct much better p 1st gen h1/ some ttimes using 1-2 h1s during the day too     Not limited by breathing from desired activities   No obvious day to day or daytime variability or assoc  Mucus plug/hemoptysis or cp or chest tightness, subjective wheeze or overt sinus or hb symptoms. No unusual exp hx or h/o childhood pna/ asthma or knowledge of premature birth.  Sleeping ok without nocturnal  or early am exacerbation  of respiratory  c/o's or need for noct saba. Also denies any obvious fluctuation of symptoms with weather or environmental changes or other aggravating or alleviating factors except as outlined above   Current Medications, Allergies, Complete Past Medical History, Past Surgical History, Family History, and Social History were reviewed in [employer] record.  ROS  The following are not active complaints unless bolded sore throat, dysphagia, dental problems, itching, sneezing,  nasal  congestion or excess/ purulent secretions, ear ache,   fever, chills, sweats, unintended wt loss, classically pleuritic or exertional cp, hemoptysis,  orthopnea pnd or leg swelling, presyncope, palpitations, abdominal pain, anorexia, nausea, vomiting, diarrhea  or change in bowel or bladder habits, change in stools or urine, dysuria,hematuria,  rash, arthralgias, visual complaints, headache, numbness, weakness or ataxia or problems with walking or coordination,  change in mood/affect or memory.  Objective:   Physical Exam    amb wf nad      08/13/2016        136  07/19/2016      135 06/07/2016        133 05/16/16 134 lb (60.8 kg)  04/16/16 135 lb 3.2 oz (61.3 kg)  03/27/16 134 lb (60.8 kg)    Vital signs reviewed - Note on arrival 02 sats  98% on RA     HEENT: nl dentition, turbinates, and oropharynx. Nl external ear canals without cough reflex   NECK :  without JVD/Nodes/TM/ nl carotid upstrokes bilaterally   LUNGS: no acc muscle use,  Nl contour chest  Perfectly clear bilaterally with  No reproducible cough on insp or exp   CV:  RRR  no s3 or murmur or increase in P2, no edema   ABD:  soft and nontender with nl inspiratory excursion in the supine position. No bruits or organomegaly, bowel sounds nl  MS:  Nl gait/ ext warm without deformities, calf tenderness, cyanosis or clubbing No obvious joint restrictions   SKIN: warm and dry without lesions    NEURO:  alert, approp, nl sensorium with  no motor deficits           Assessment:

## 2016-08-21 ENCOUNTER — Ambulatory Visit (HOSPITAL_COMMUNITY)
Admission: RE | Admit: 2016-08-21 | Discharge: 2016-08-21 | Disposition: A | Discharge status: Home / Self Care | Payer: BLUE CROSS/BLUE SHIELD | Source: Ambulatory Visit | Attending: Internal Medicine | Admitting: Internal Medicine

## 2016-08-21 DIAGNOSIS — J45991 Cough variant asthma: Secondary | ICD-10-CM | POA: Insufficient documentation

## 2016-08-21 LAB — PULMONARY FUNCTION TEST
FEF 25-75 POST: 3.31 L/s
FEF 25-75 PRE: 3.27 L/s
FEF2575-%CHANGE-POST: 1 %
FEF2575-%PRED-POST: 142 %
FEF2575-%PRED-PRE: 141 %
FEV1-%CHANGE-POST: 0 %
FEV1-%PRED-POST: 110 %
FEV1-%Pred-Pre: 111 %
FEV1-POST: 2.6 L
FEV1-Pre: 2.62 L
FEV1FVC-%Change-Post: 0 %
FEV1FVC-%Pred-Pre: 108 %
FEV6-%CHANGE-POST: -1 %
FEV6-%PRED-POST: 102 %
FEV6-%Pred-Pre: 103 %
FEV6-Post: 3.01 L
FEV6-Pre: 3.05 L
FEV6FVC-%Pred-Post: 103 %
FEV6FVC-%Pred-Pre: 103 %
FVC-%Change-Post: -1 %
FVC-%Pred-Post: 99 %
FVC-%Pred-Pre: 100 %
FVC-Post: 3.01 L
FVC-Pre: 3.05 L
POST FEV1/FVC RATIO: 87 %
PRE FEV1/FVC RATIO: 86 %
Post FEV6/FVC ratio: 100 %
Pre FEV6/FVC Ratio: 100 %

## 2016-08-21 MED ORDER — METHACHOLINE 4 MG/ML NEB SOLN
2.0000 mL | Freq: Once | RESPIRATORY_TRACT | Status: AC
  Administered 2016-08-21: 8 mg via RESPIRATORY_TRACT

## 2016-08-21 MED ORDER — ALBUTEROL SULFATE (2.5 MG/3ML) 0.083% IN NEBU
2.5000 mg | INHALATION_SOLUTION | Freq: Once | RESPIRATORY_TRACT | Status: AC
  Administered 2016-08-21: 2.5 mg via RESPIRATORY_TRACT

## 2016-08-21 MED ORDER — SODIUM CHLORIDE 0.9 % IN NEBU
3.0000 mL | INHALATION_SOLUTION | Freq: Once | RESPIRATORY_TRACT | Status: AC
  Administered 2016-08-21: 3 mL via RESPIRATORY_TRACT

## 2016-08-21 MED ORDER — METHACHOLINE 1 MG/ML NEB SOLN
2.0000 mL | Freq: Once | RESPIRATORY_TRACT | Status: AC
  Administered 2016-08-21: 2 mg via RESPIRATORY_TRACT

## 2016-08-21 MED ORDER — METHACHOLINE 0.25 MG/ML NEB SOLN
2.0000 mL | Freq: Once | RESPIRATORY_TRACT | Status: AC
  Administered 2016-08-21: 0.5 mg via RESPIRATORY_TRACT

## 2016-08-21 MED ORDER — METHACHOLINE 16 MG/ML NEB SOLN
2.0000 mL | Freq: Once | RESPIRATORY_TRACT | Status: AC
  Administered 2016-08-21: 32 mg via RESPIRATORY_TRACT

## 2016-08-21 MED ORDER — METHACHOLINE 0.0625 MG/ML NEB SOLN
2.0000 mL | Freq: Once | RESPIRATORY_TRACT | Status: AC
  Administered 2016-08-21: 0.125 mg via RESPIRATORY_TRACT

## 2016-08-22 NOTE — Progress Notes (Signed)
Spoke with pt and notified of results per Dr. Wert. Pt verbalized understanding.

## 2016-08-30 ENCOUNTER — Encounter: Payer: Self-pay | Admitting: Cardiology

## 2016-08-31 MED ORDER — ATORVASTATIN CALCIUM 10 MG PO TABS: 10.0000 mg | ORAL_TABLET | Freq: Every day | ORAL | 1 refills | Status: DC

## 2016-09-24 ENCOUNTER — Ambulatory Visit (INDEPENDENT_AMBULATORY_CARE_PROVIDER_SITE_OTHER): Payer: BLUE CROSS/BLUE SHIELD | Admitting: Internal Medicine

## 2016-09-24 ENCOUNTER — Encounter: Payer: Self-pay | Admitting: Internal Medicine

## 2016-09-24 VITALS — BP 118/74 | HR 79 | Ht 61.0 in | Wt 134.8 lb

## 2016-09-24 DIAGNOSIS — R053 Chronic cough: Secondary | ICD-10-CM

## 2016-09-24 DIAGNOSIS — J45991 Cough variant asthma: Secondary | ICD-10-CM | POA: Diagnosis not present

## 2016-09-24 DIAGNOSIS — R05 Cough: Secondary | ICD-10-CM | POA: Diagnosis not present

## 2016-09-24 NOTE — Progress Notes (Signed)
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 Pulmonary Consult    referred by [name] for cough.  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 started one month prior to OV   Cough is worse immediately at hs and wakes her up most nights not flaring in ams proair use minimal  asthmanex / Breo both dpi versions some better only  protonix back on it x summer 2017  Chlorpheniramine 4 mg x one at hs  Pos urinary incont/ occ gagging with severe cough fits  Fumes/ talking make it worse  Some hb/ some nasal congestion but don't necessary correlate with the cough  Not sob unless coughing  rec Stop breo  Plan A = Automatic =  Dulera 100 Take 2 puffs first thing in am and then another 2 puffs about 12 hours later and continue protonix 40 mg Take 30-60 min before first meal of the day and add pepcid 20 mg one hour before bedtime with chlorpheniramine x 2  Work on inhaler technique:    Plan B = Backup Only use your albuterol as a rescue medication For cough > mucinex dm 1200 mg or delsym 2 tsp every 12 hours and use the flutter valve as much as possible  GERD diet    05/24/2016 acute extended ov/Ashland Osmer re: flare in cough present since 2010 ?  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 Acute Visit    Cough not improving. She is occ coughing up some green sputum.   only one chlorpheniramine at hs - has not tried in daytime  Was some better on dulera 100 2bid then took a turn for the worse 05/20/16 and cough changed to productive green mucus,  no better with mucinex dm/ flutter (not really using well)  rec For drainage / throat tickle try take CHLORPHENIRAMINE  4 mg - take one every 4 hours as needed -   Work on inhaler technique:    suppress cough as much as possible with the hycodan when you can afford to be sleepy When you can't's your best bet is mucinex dm 1200 mg every 12 hours and use the flutter valve as much as possible omnicef 300 mg twice daily x 10 days  depomedrol 120 mg IM today     06/07/2016  f/u ov/Bellah Alia re: cough  x 7 y with prev Pos MCT/ maint rx dulera 100 2bid and singulair  Chief Complaint  Patient presents with  . Acute Visit    Cough still bothering her. She states she is coughing some less. Still coughing up clear sputum.  rec Once you get the depomerol 120 mg IM >> goal is 100% cough suppression x 3 days: Take delsym two tsp every 12 hours and supplement if needed with vicodin up to 2 every 4 hours to suppress the urge to cough. Swallowing water or using ice chips/non mint and menthol containing candies (such as lifesavers or sugarless jolly ranchers) are also effective.  You should rest your voice and avoid activities that you know make you cough. Once you have eliminated the cough for 3 straight days try reducing the vicodin  first,  then the delsym as tolerated.   F/u ct sinus/chest neg    07/19/2016  Acute extended  ov/[name] re:  Cough variant asthma/ on dulera 100 2bid/singulair /ppi/h2hsh1hs Chief Complaint  Patient presents with  . Acute Visit    Pt c/o increased cough for the past 3 wks- prod with green sputum.     best she's been in a while was after cough suppression maybe 80% then abruptly  worse after caught a cold from a contact Cough is worse at hs and early in am s sob or wheeze  rec Change the bedtime doses to 1 hour before bed  pepcid and chlorphenirmine  Goal is 100% cough suppression x 3 days: Take delsym two tsp every 12 hours and supplement if needed with vicodin up to 2 every 4 hours to suppress the urge to cough. Swallowing water or using ice chips/non mint and menthol containing candies (such as lifesavers or sugarless jolly ranchers) are also effective.  You should rest your voice and avoid activities that you know make you cough  Omnicef 300 mg twice daily x 10 days  Depomedrol 120 mg IM today      08/13/2016  f/u ov/[name] re: ? Cough variant asthma vs uacs  Chief Complaint  Patient presents with  . Follow-up    Cough has improved some but not completely resolved.   saba sometimes helps the cough, sometimes does not / avg use  Cough is 90% resolved on dulera 100 2bid/ singulair/ ppi ac and  pepcid and h1 hs and during the day one or two h1's  Noct much better p 1st gen h1/ some ttimes using 1-2 h1s during the day too  rec Decrease dulera to one twice daily to see if you get same control without the tremor - if cough worse then restart the 2 every 12 hour dosing  schedule Methacholine challenge with no dulera for 12 hours but everything else stay the same   > done 07/21/17 and neg     09/24/2016  f/u ov/[name] re:  uacs  Chief Complaint  Patient presents with  . Follow-up    Cough is much improved.   some throat clearing since working outside, convinced she has allergies/ better with 1st gen h1, still on dulera 100 one bid   Not limited by breathing from desired activities    No obvious day to day or daytime variability or assoc excess/ purulent sputum or mucus plugs or hemoptysis or cp or chest tightness, subjective wheeze or overt sinus or hb symptoms. No unusual exp hx or h/o childhood pna/ asthma or knowledge of premature birth.  Sleeping ok without  nocturnal  or early am exacerbation  of respiratory  c/o's or need for noct saba. Also denies any obvious fluctuation of symptoms with weather or environmental changes or other aggravating or alleviating factors except as outlined above   Current Medications, Allergies, Complete Past Medical History, Past Surgical  History, Family History, and Social History were reviewed in [facility] electronic medical record.  ROS  The following are not active complaints unless bolded sore throat, dysphagia, dental problems, itching, sneezing,  nasal congestion or excess/ purulent secretions, ear ache,   fever, chills, sweats, unintended wt loss, classically pleuritic or exertional cp,  orthopnea pnd or leg swelling, presyncope, palpitations, abdominal pain, anorexia, nausea, vomiting, diarrhea  or change in bowel or bladder habits, change in stools or urine, dysuria,hematuria,  rash, arthralgias, visual complaints, headache, numbness, weakness or ataxia or problems with walking or coordination,  change in mood/affect or memory.             Objective:   Physical Exam    amb wf nad   With freq throat clearing   09/24/2016        134  08/13/2016        136  07/19/2016      135 06/07/2016        133 05/16/16 134 lb (60.8 kg)  04/16/16 135 lb 3.2 oz (61.3 kg)  03/27/16 134 lb (60.8 kg)    Vital signs reviewed - Note on arrival 02 sats  98% on RA     HEENT: nl dentition, turbinates, and oropharynx which is pristine . Nl external ear canals without cough reflex   NECK :  without JVD/Nodes/TM/ nl carotid upstrokes bilaterally   LUNGS: no acc muscle use,  Nl contour chest  Perfectly clear bilaterally with  No reproducible cough on insp or exp   CV:  RRR  no s3 or murmur or increase in P2, no edema   ABD:  soft and nontender with nl inspiratory excursion in the supine position. No bruits or organomegaly, bowel sounds nl  MS:  Nl gait/ ext warm without deformities, calf tenderness, cyanosis or  clubbing No obvious joint restrictions   SKIN: warm and dry without lesions    NEURO:  alert, approp, nl sensorium with  no motor deficits           Assessment:

## 2016-09-24 NOTE — Patient Instructions (Addendum)
Try to clear your throat - chlorpheniramine and hard rock candy will help  Please see patient coordinator before you leave today  to schedule [facility] evaluation but hold antihistamines first  Try off dulera to what difference if any this makes - ok to restart if worse   Call if neg work up for trial of gabapentin 100 mg three times day   See Emeline GeneralSandy Fuller for snoring

## 2016-09-24 NOTE — Assessment & Plan Note (Signed)
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  Please see AVS for specific  Instructions which are unique to this visit and I personally typed out  which were reviewed in detail in writing with the patient and a copy provided.

## 2016-09-24 NOTE — Assessment & Plan Note (Signed)
Onset after childbirth 1991 - flutter valve rx  05/16/2016 >>> - Sinus CT 06/12/2016 No evidence for acute or chronic paranasal sinus disease. - CT chest hrct 06/12/2016 wnl x coronary calcium  - [facility] but if neg > rec gabapentin trial     Upper airway cough syndrome (previously labeled PNDS) , is  so named because it's frequently impossible to sort out how much is  CR/sinusitis with freq throat clearing (which can be related to primary GERD)   vs  causing  secondary (" extra esophageal")  GERD from wide swings in gastric pressure that occur with throat clearing, often  promoting self use of mint and menthol lozenges that reduce the lower esophageal sphincter tone and exacerbate the problem further in a cyclical fashion.   These are the same pts (now being labeled as having "irritable larynx syndrome" by some cough centers) who not infrequently have a history of having failed to tolerate ace inhibitors,  dry powder inhalers or biphosphonates or report having atypical/extraesophageal reflux symptoms that don't respond to standard doses of PPI  and are easily confused as having aecopd or asthma flares by even experienced allergists/ pulmonologists (myself included).   She is better on gerd rx/ 1st gen H1 but still convinced she has pnds related to allergies so needs allergy eval then ok to rx with gabapentin next

## 2016-10-01 ENCOUNTER — Other Ambulatory Visit: Payer: Self-pay | Admitting: Cardiology

## 2016-10-18 ENCOUNTER — Encounter: Payer: Self-pay | Admitting: Internal Medicine

## 2016-10-19 NOTE — Telephone Encounter (Signed)
yes

## 2016-10-23 ENCOUNTER — Other Ambulatory Visit: Payer: BLUE CROSS/BLUE SHIELD

## 2016-10-29 NOTE — Progress Notes (Signed)
Chief Complaint  Patient presents with  . Annual Exam    labs for other providers med managment    HPI: Patient  Lisa Lee  56 y.o. comes in today for Preventive Health Care visit  Hx abn pap and conization pos hpv following dr Stefano Gaul Cough better under eval no asthma now  Hs been on steroids  injectinos to min  Se  Not recnetly m had 3 months of deopmed 120 in the fall  None since? Needs [facility]  lithium and bmp tsh Need refill protonix   init per dr Lacretia Nicks ? Continue  ctm helpd the PND   Health Maintenance  Topic Date Due  . Hepatitis C Screening  Mar 28, 1961  . HIV Screening  10/30/2017 (Originally 10/30/1974)  . INFLUENZA VACCINE  02/27/2017  . MAMMOGRAM  05/30/2018  . PAP SMEAR  06/07/2019  . TETANUS/TDAP  02/21/2021  . COLONOSCOPY  02/13/2025   Health Maintenance Review LIFESTYLE:  Exercise:  Not now  Tobacco/ETS:n Alcohol: ocass Sugar beverages:n Sleep:8 hours Drug use: no HH of  Work:44-50 hours going to days     ROS:  Hs abn paps  Had conization  Pos hrhpv nl pathology  GEN/ HEENT: No fever, significant weight changes sweats headaches vision problems hearing changes, CV/ PULM; No chest pain shortness of breath cough, syncope,edema  change in exercise tolerance. GI /GU: No adominal pain, vomiting, change in bowel habits. No blood in the stool. No significant GU symptoms. SKIN/HEME: ,no acute skin rashes suspicious lesions or bleeding. No lymphadenopathy, nodules, masses.  NEURO/ PSYCH:  No neurologic signs such as weakness numbness. No depression anxiety. IMM/ Allergy: No unusual infections.  Allergy .   REST of 12 system review negative except as per HPI   Past Medical History:  Diagnosis Date  . Acid reflux   . Allergic rhinitis   . Asthma   . Depression   . Diarrhea 08/09/2011   Poss ibs  Vs other  Related to stress  consdier other eval if needed. Had colonoscoopy per dr Matthias Hughs   . Hemorrhoids   . High cholesterol   .  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 Hypothyroidism   . Medication side effect 11/01/2011   tussionex   distrubed sleep     Past Surgical History:  Procedure Laterality Date  . CERVICAL CONIZATION W/BX N/A 07/21/2015   Procedure: CONIZATION CERVIX WITH BIOPSY;  Surgeon: Kirkland Hun, MD;  Location: [facility];  Service: Gynecology;  Laterality: N/A;  . CERVICAL CRYOTHERAPY  1984  . COLPOSCOPY  08/26/13  . DILATION AND CURETTAGE, DIAGNOSTIC / THERAPEUTIC  1994   Blighted Ovum  . HYSTEROSCOPY     uterine polypectomy Jan 7th  . HYSTEROSCOPY WITH RESECTOSCOPE  08/04/2009   Removed polyp & IUD  . LYMPH NODE BIOPSY    . TONSILLECTOMY      Family History  Problem Relation Age of Onset  . Heart disease Sister 74    cabg stent  . Arthritis Mother   . Heart disease Mother   . Heart disease Father   . Colon cancer Father   . Parkinson's disease Father   . Pulmonary embolism Daughter   . Heart disease Sister   . Thyroid disease Sister     Social History   Social History  . Marital status: Married    Spouse name: N/A  . Number of children: N/A  . Years of education: N/A   Social History Main Topics  .  Smoking status: Never Smoker  . Smokeless tobacco: Never Used  . Alcohol use 0.0 oz/week     Comment: once a month  . Drug use: No  . Sexual activity: Yes    Birth control/ protection: None   Other Topics Concern  . None   Social History Narrative    [employer] 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 albuterol (PROAIR HFA) 108 (90 Base) MCG/ACT inhaler Inhale 2 puffs into the lungs every 6 (six) hours as needed for wheezing. 3 Inhaler 0  . aspirin EC 81 MG tablet Take 1 tablet (81 mg total) by mouth daily.    Marland Kitchen atorvastatin (LIPITOR) 10 MG tablet TAKE 1  TABLET BY MOUTH daily 30 tablet 1  . Doxylamine Succinate, Sleep, (UNISOM PO) Take 1 tablet by mouth at bedtime as needed (for sleep).     Marland Kitchen escitalopram (LEXAPRO) 20 MG tablet Take 1 tablet (20 mg total) by mouth daily. (Patient taking differently: Take 20 mg by mouth 2 (two) times daily. ) 30 tablet 5  . famotidine (PEPCID) 20 MG tablet One at bedtime 90 tablet 3  . fluticasone (FLONASE) 50 MCG/ACT nasal spray Place into both nostrils daily.    Marland Kitchen ibuprofen (ADVIL,MOTRIN) 200 MG tablet Take 400 mg by mouth every 6 (six) hours as needed for headache or mild pain.     Marland Kitchen levothyroxine (SYNTHROID, LEVOTHROID) 75 MCG tablet Take 1 tablet (75 mcg total) by mouth daily. 90 tablet 3  . lithium carbonate (LITHOBID) 300 MG CR tablet Take 1 tablet (300 mg total) by mouth 2 (two) times daily. 180 tablet 0  . Misc. Devices (ACAPELLA) MISC Use as directed 1 each 0  . montelukast (SINGULAIR) 10 MG tablet Take 1 tablet (10 mg total) by mouth at bedtime. 90 tablet 3  . Multiple Vitamins-Minerals (MULTIVITAMIN WITH MINERALS) tablet Take 1 tablet by mouth daily.    . chlorpheniramine (CHLOR-TRIMETON) 4 MG tablet daily. 2 at bedtime and then as needed during the day     . Estradiol (VAGIFEM) 10 MCG TABS vaginal tablet Place 1 each vaginally 2 (two) times a week.     . pantoprazole (PROTONIX) 40 MG tablet Take 1 tablet (40 mg total) by mouth daily. 90 tablet 1  . mometasone-formoterol (DULERA) 100-5 MCG/ACT AERO Inhale 2 puffs into the lungs 2 (two) times daily. (Patient not taking: Reported on 10/30/2016) 3 Inhaler 3   No facility-administered medications prior to visit.      EXAM:  BP 118/70 (BP Location: Right Arm, Patient Position: Sitting, Cuff Size: Normal)   Pulse 72   Temp 98 F (36.7 C) (Oral)   Ht  (1.549 m)   Wt 137 lb 9.6 oz (62.4 kg)   LMP 12/09/2013   BMI 26.00 kg/m   Body mass index is 26 kg/m. Wt Readings from Last 3 Encounters:  10/31/16 137 lb 9.6 oz (62.4 kg)  10/30/16 137 lb  9.6 oz (62.4 kg)  09/24/16 134 lb 12.8 oz (61.1 kg)    Physical Exam: Vital signs reviewed ZOX:WRUE is a well-developed well-nourished alert cooperative    who appearsr stated age in no acute distress.  Looks [employer]  puffy steroid? facies HEENT: normocephalic atraumatic , Eyes: PERRL EOM's full, conjunctiva clear, Nares: paten,t no deformity discharge or tenderness., Ears: no deformity EAC's clear TMs with normal landmarks. Mouth: clear OP, no lesions, edema.  Moist mucous membranes. Dentition in adequate repair. NECK: supple without masses, thyromegaly or bruits. CHEST/PULM:  Clear to auscultation and percussion breath sounds equal no wheeze , rales or rhonchi. No chest wall deformities or tenderness. Mild kyphosis   Breast: normal by inspection . No dimpling, discharge, masses, tenderness or discharge . CV: PMI is nondisplaced, S1 S2 no gallops, murmurs, rubs. Peripheral pulses are full without delay.No JVD .  ABDOMEN: Bowel sounds normal nontender  No guard or rebound, no hepato splenomegal no CVA tenderness.  No hernia. Extremtities:  No clubbing cyanosis or edema, no acute joint swelling or redness no focal atrophy NEURO:  Oriented x3, cranial nerves 3-12 appear to be intact, no obvious focal weakness,gait within normal limits no abnormal reflexes or asymmetrical SKIN: No acute rashes normal turgor, color, no bruising or petechiae. PSYCH: Oriented, good eye contact, no obvious depression anxiety, cognition and judgment appear normal. LN: no cervical axillary inguinal adenopathy  Lab Results  Component Value Date   WBC 10.6 (H) 10/30/2016   HGB 13.9 10/30/2016   HCT 42.1 10/30/2016   PLT 332.0 10/30/2016   GLUCOSE 91 10/30/2016   CHOL 196 10/30/2016   TRIG 208.0 (H) 10/30/2016   HDL 63.50 10/30/2016   LDLDIRECT 101.0 10/30/2016   LDLCALC 81 01/24/2016   ALT 21 10/30/2016   AST 17 10/30/2016   NA 140 10/30/2016   K 4.6 10/30/2016   CL 106 10/30/2016   CREATININE 0.86 10/30/2016    BUN 17 10/30/2016   CO2 27 10/30/2016   TSH 2.14 10/30/2016    BP Readings from Last 3 Encounters:  10/31/16 132/76  10/30/16 118/70  09/24/16 118/74   Wt Readings from Last 3 Encounters:  10/31/16 137 lb 9.6 oz (62.4 kg)  10/30/16 137 lb 9.6 oz (62.4 kg)  09/24/16 134 lb 12.8 oz (61.1 kg)    Lab results reviewed with patient   ASSESSMENT AND PLAN:  Discussed the following assessment and plan:  Visit for preventive health examination - Plan: Basic metabolic panel, CBC with Differential/Platelet, Hepatic function panel, Lipid panel, TSH, Lithium level  Hypothyroidism, unspecified type - Plan: Basic metabolic panel, CBC with Differential/Platelet, Hepatic function panel, Lipid panel, TSH, Lithium level  Medication management - refill ppi for now future plan to dec dose  used per pulm for cough ?  - Plan: Basic metabolic panel, CBC with Differential/Platelet, Hepatic function panel, Lipid panel, TSH, Lithium level  FH: premature coronary heart disease  Drug therapy - Plan: Basic metabolic panel, CBC with Differential/Platelet, Hepatic function panel, Lipid panel, TSH, Lithium level  Hyperlipidemia, unspecified hyperlipidemia type - Plan: Basic metabolic panel, CBC with Differential/Platelet, Hepatic function panel, Lipid panel, TSH, Lithium level  Problem related to lifestyle - Plan: Hepatitis C antibody  Patient Care Team: Madelin Headings, MD as PCP - General Loletha Carrow, MD (Ophthalmology) Kirkland Hun, MD (Obstetrics and Gynecology) Bernette Redbird, MD as Consulting Physician (Gastroenterology) [name], MD as Consulting Physician (Pulmonary Disease) Patient Instructions   Attention to healthy lifestyle exercise  And   Bone health    Attention cause tof the steroids you had to be on .  Glad the cough is getting better.  BP Readings from Last 3 Encounters:  10/30/16 118/70  09/24/16 118/74  08/13/16 112/74  Standley Brooking. Moranda Billiot M.D.

## 2016-10-30 ENCOUNTER — Ambulatory Visit (INDEPENDENT_AMBULATORY_CARE_PROVIDER_SITE_OTHER): Payer: BLUE CROSS/BLUE SHIELD | Admitting: Internal Medicine

## 2016-10-30 ENCOUNTER — Encounter: Payer: Self-pay | Admitting: Internal Medicine

## 2016-10-30 VITALS — BP 118/70 | HR 72 | Temp 98.0°F | Ht 61.0 in | Wt 137.6 lb

## 2016-10-30 DIAGNOSIS — E785 Hyperlipidemia, unspecified: Secondary | ICD-10-CM | POA: Diagnosis not present

## 2016-10-30 DIAGNOSIS — Z729 Problem related to lifestyle, unspecified: Secondary | ICD-10-CM

## 2016-10-30 DIAGNOSIS — Z79899 Other long term (current) drug therapy: Secondary | ICD-10-CM

## 2016-10-30 DIAGNOSIS — Z0000 Encounter for general adult medical examination without abnormal findings: Secondary | ICD-10-CM

## 2016-10-30 DIAGNOSIS — E039 Hypothyroidism, unspecified: Secondary | ICD-10-CM | POA: Diagnosis not present

## 2016-10-30 DIAGNOSIS — Z8249 Family history of ischemic heart disease and other diseases of the circulatory system: Secondary | ICD-10-CM

## 2016-10-30 LAB — CBC WITH DIFFERENTIAL/PLATELET
BASOS PCT: 0.6 % (ref 0.0–3.0)
Basophils Absolute: 0.1 10*3/uL (ref 0.0–0.1)
EOS ABS: 0.4 10*3/uL (ref 0.0–0.7)
Eosinophils Relative: 3.5 % (ref 0.0–5.0)
HEMATOCRIT: 42.1 % (ref 36.0–46.0)
HEMOGLOBIN: 13.9 g/dL (ref 12.0–15.0)
LYMPHS PCT: 23.9 % (ref 12.0–46.0)
Lymphs Abs: 2.5 10*3/uL (ref 0.7–4.0)
MCHC: 33.1 g/dL (ref 30.0–36.0)
MCV: 90 fl (ref 78.0–100.0)
MONOS PCT: 7.3 % (ref 3.0–12.0)
Monocytes Absolute: 0.8 10*3/uL (ref 0.1–1.0)
NEUTROS ABS: 6.8 10*3/uL (ref 1.4–7.7)
Neutrophils Relative %: 64.7 % (ref 43.0–77.0)
Platelets: 332 10*3/uL (ref 150.0–400.0)
RBC: 4.68 Mil/uL (ref 3.87–5.11)
RDW: 12.5 % (ref 11.5–15.5)
WBC: 10.6 10*3/uL — AB (ref 4.0–10.5)

## 2016-10-30 LAB — HEPATIC FUNCTION PANEL
ALBUMIN: 4.5 g/dL (ref 3.5–5.2)
ALK PHOS: 64 U/L (ref 39–117)
ALT: 21 U/L (ref 0–35)
AST: 17 U/L (ref 0–37)
BILIRUBIN DIRECT: 0.1 mg/dL (ref 0.0–0.3)
TOTAL PROTEIN: 7.1 g/dL (ref 6.0–8.3)
Total Bilirubin: 0.5 mg/dL (ref 0.2–1.2)

## 2016-10-30 LAB — BASIC METABOLIC PANEL
BUN: 17 mg/dL (ref 6–23)
CHLORIDE: 106 meq/L (ref 96–112)
CO2: 27 meq/L (ref 19–32)
CREATININE: 0.86 mg/dL (ref 0.40–1.20)
Calcium: 10.2 mg/dL (ref 8.4–10.5)
GFR: 72.29 mL/min (ref >60.00)
GLUCOSE: 91 mg/dL (ref 70–99)
Potassium: 4.6 mEq/L (ref 3.5–5.1)
SODIUM: 140 meq/L (ref 135–145)

## 2016-10-30 LAB — TSH: TSH: 2.14 u[IU]/mL (ref 0.35–4.50)

## 2016-10-30 LAB — LIPID PANEL
Cholesterol: 196 mg/dL (ref 0–200)
HDL: 63.5 mg/dL (ref >39.00)
NONHDL: 132.31
TRIGLYCERIDES: 208 mg/dL — AB (ref 0.0–149.0)
Total CHOL/HDL Ratio: 3
VLDL: 41.6 mg/dL — ABNORMAL HIGH (ref 0.0–40.0)

## 2016-10-30 LAB — LDL CHOLESTEROL, DIRECT: Direct LDL: 101 mg/dL

## 2016-10-30 MED ORDER — PANTOPRAZOLE SODIUM 40 MG PO TBEC: 40.0000 mg | DELAYED_RELEASE_TABLET | Freq: Every day | ORAL | 2 refills | Status: DC

## 2016-10-30 NOTE — Patient Instructions (Addendum)
Attention to healthy lifestyle exercise  And   Bone health    Attention cause tof the steroids you had to be on .  Glad the cough is getting better.  BP Readings from Last 3 Encounters:  10/30/16 118/70  09/24/16 118/74  08/13/16 112/74

## 2016-10-31 ENCOUNTER — Ambulatory Visit (INDEPENDENT_AMBULATORY_CARE_PROVIDER_SITE_OTHER): Payer: BLUE CROSS/BLUE SHIELD | Admitting: Allergy and Immunology

## 2016-10-31 ENCOUNTER — Encounter: Payer: Self-pay | Admitting: Allergy and Immunology

## 2016-10-31 VITALS — BP 132/76 | HR 72 | Temp 98.4°F | Resp 18 | Ht 60.83 in | Wt 137.6 lb

## 2016-10-31 DIAGNOSIS — D721 Eosinophilia, unspecified: Secondary | ICD-10-CM

## 2016-10-31 DIAGNOSIS — J3089 Other allergic rhinitis: Secondary | ICD-10-CM

## 2016-10-31 DIAGNOSIS — D7218 Eosinophilia in diseases classified elsewhere: Secondary | ICD-10-CM

## 2016-10-31 DIAGNOSIS — J40 Bronchitis, not specified as acute or chronic: Secondary | ICD-10-CM

## 2016-10-31 DIAGNOSIS — K219 Gastro-esophageal reflux disease without esophagitis: Secondary | ICD-10-CM | POA: Diagnosis not present

## 2016-10-31 LAB — HEPATITIS C ANTIBODY: HCV Ab: NEGATIVE

## 2016-10-31 LAB — LITHIUM LEVEL: Lithium Lvl: 0.6 mmol/L (ref 0.6–1.2)

## 2016-10-31 MED ORDER — FLUTICASONE FUROATE 100 MCG/ACT IN AEPB: 1.0000 | INHALATION_SPRAY | Freq: Every day | RESPIRATORY_TRACT | 5 refills | Status: DC

## 2016-10-31 NOTE — Progress Notes (Signed)
Dear Dr. Sherene Sires,  Thank you for referring Lisa Lee to the [facility] [place] on 10/31/2016.   Below is a summation of this patient's evaluation and recommendations.  Thank you for your referral. I will keep you informed about this patient's response to treatment.   If you have any questions please do not hesitate to contact me.   Sincerely,  Jessica Priest, MD Allergy / Immunology [facility]   ______________________________________________________________________    NEW PATIENT NOTE  Referring Provider: [name], MD Primary Provider: Lorretta Harp, MD Date of office visit: 10/31/2016    Subjective:   Chief Complaint:  Lisa Lee (DOB: 11/07/1960) is a 56 y.o. female who presents to the clinic on 10/31/2016 with a chief complaint of Cough .     HPI: Lisa Lee presents to this clinic in evaluation of unrelenting cough.  Lisa Lee has a history of developing "bronchitis" about twice a year and underwent evaluation with Dr. Maple Hudson many years ago including a positive methacholine challenge test and was treated as though she had asthma for a prolonged period in time. Over the course of the past year she had multiple events of this "bronchitis" manifested as unrelenting and prolonged cough with coughing spells with gagging and retching and micturition and vomiting. She did not respond to the administration of a short acting bronchodilator. Each one of these episodes was precipitated by a head cold that dropped down into her chest. She's been treated with multiple courses of systemic steroids and antibiotics which have not really helped her very much.  She recently had evaluation with Dr. Sherene Sires who performed a chest CT scan, sinus CT scan, blood tests, and a methacholine challenge test all of which did not identify any significant respiratory tract issue including no evidence  of significant hyperresponsive bronchospastic lung disease. She has been treated with a combination of a proton pump inhibitor and H2 receptor blocker recently for reflux-induced respiratory disease. She still had one of her episodes even while utilizing this plan. All of her respiratory tract inhalers have been removed but she continues on Flonase and chlorpheniramine for "postnasal drip" which does appear to help this issue. She does have some intermittent heartburn that is completely controlled on her medications.  Past Medical History:  Diagnosis Date  . Acid reflux   . Allergic rhinitis   . Asthma   . Depression   . Diarrhea 08/09/2011   Poss ibs  Vs other  Related to stress  consdier other eval if needed. Had colonoscoopy per dr Matthias Hughs   . Hemorrhoids   . High cholesterol   . 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 Hypothyroidism   . Medication side effect 11/01/2011   tussionex   distrubed sleep     Past Surgical History:  Procedure Laterality Date  . CERVICAL CONIZATION W/BX N/A 07/21/2015   Procedure: CONIZATION CERVIX WITH BIOPSY;  Surgeon: Kirkland Hun, MD;  Location: [facility];  Service: Gynecology;  Laterality: N/A;  . CERVICAL CRYOTHERAPY  1984  . COLPOSCOPY  08/26/13  . DILATION AND CURETTAGE, DIAGNOSTIC / THERAPEUTIC  1994   Blighted Ovum  . HYSTEROSCOPY     uterine polypectomy Jan 7th  . HYSTEROSCOPY WITH RESECTOSCOPE  08/04/2009   Removed polyp & IUD  . LYMPH NODE BIOPSY    . TONSILLECTOMY      Allergies as of 10/31/2016      Reactions  Penicillins Hives, Shortness Of Breath   Has patient had a PCN reaction causing immediate rash, facial/tongue/throat swelling, SOB or lightheadedness with hypotension: Yes Has patient had a PCN reaction causing severe rash involving mucus membranes or skin necrosis: No Has patient had a PCN reaction that required hospitalization No Has patient had a PCN reaction occurring within the last 10 years: No If all of the above answers are "NO", then may  proceed with Cephalosporin use.   Pravastatin Other (See Comments)   Cause muscle aches and cramps   Rosuvastatin Other (See Comments)   Caused pain in hands, shoulders, back, and indigestion      Medication List      ACAPELLA Misc Use as directed   albuterol 108 (90 Base) MCG/ACT inhaler Commonly known as:  PROAIR HFA Inhale 2 puffs into the lungs every 6 (six) hours as needed for wheezing.   aspirin EC 81 MG tablet Take 1 tablet (81 mg total) by mouth daily.   atorvastatin 10 MG tablet Commonly known as:  LIPITOR TAKE 1 TABLET BY MOUTH daily   chlorpheniramine 4 MG tablet Commonly known as:  CHLOR-TRIMETON Take 4 mg by mouth daily as needed for allergies.   escitalopram 20 MG tablet Commonly known as:  LEXAPRO Take 1 tablet (20 mg total) by mouth daily.   famotidine 20 MG tablet Commonly known as:  PEPCID One at bedtime   fluticasone 50 MCG/ACT nasal spray Commonly known as:  FLONASE Place into both nostrils daily.   ibuprofen 200 MG tablet Commonly known as:  ADVIL,MOTRIN Take 400 mg by mouth every 6 (six) hours as needed for headache or mild pain.   levothyroxine 75 MCG tablet Commonly known as:  SYNTHROID, LEVOTHROID Take 1 tablet (75 mcg total) by mouth daily.   lithium carbonate 300 MG CR tablet Commonly known as:  LITHOBID Take 1 tablet (300 mg total) by mouth 2 (two) times daily.   mometasone-formoterol 100-5 MCG/ACT Aero Commonly known as:  DULERA Inhale 2 puffs into the lungs 2 (two) times daily.   montelukast 10 MG tablet Commonly known as:  SINGULAIR Take 1 tablet (10 mg total) by mouth at bedtime.   multivitamin with minerals tablet Take 1 tablet by mouth daily.   pantoprazole 40 MG tablet Commonly known as:  PROTONIX Take 1 tablet (40 mg total) by mouth daily.   UNISOM PO Take 1 tablet by mouth at bedtime as needed (for sleep).   YUVAFEM 10 MCG Tabs vaginal tablet Generic drug:  Estradiol       Review of systems negative  except as noted in HPI / PMHx or noted below:  Review of Systems  Constitutional: Negative.   HENT: Negative.   Eyes: Negative.   Respiratory: Negative.   Cardiovascular: Negative.   Gastrointestinal: Negative.   Genitourinary: Negative.   Musculoskeletal: Negative.   Skin: Negative.   Neurological: Negative.   Endo/Heme/Allergies: Negative.   Psychiatric/Behavioral: Negative.     Family History  Problem Relation Age of Onset  . Heart disease Sister 72    cabg stent  . Arthritis Mother   . Heart disease Mother   . Heart disease Father   . Colon cancer Father   . Parkinson's disease Father   . Pulmonary embolism Daughter   . Heart disease Sister   . Thyroid disease Sister     Social History   Social History  . Marital status: Married    Spouse name: N/A  . Number of children: N/A  .  Years of education: N/A   Occupational History  . Not on file.   Social History Main Topics  . Smoking status: Never Smoker  . Smokeless tobacco: Never Used  . Alcohol use 0.0 oz/week     Comment: once a month  . Drug use: No  . Sexual activity: Yes    Birth control/ protection: None   Other Topics Concern  . Not on file   Social History Narrative    [employer] 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 She is a Patent examiner.  Objective:   Vitals:   10/31/16 1002  BP: 132/76  Pulse: 72  Resp: 18  Temp: 98.4 F (36.9 C)   Height: 5' 0.83" (154.5 cm) Weight: 137 lb 9.6 oz (62.4 kg)  Physical Exam  Constitutional: She is well-developed, well-nourished, and in no distress.  HENT:  Head:  Normocephalic. Head is without right periorbital erythema and without left periorbital erythema.  Right Ear: Tympanic membrane, external ear and ear canal normal.  Left Ear: Tympanic membrane, external ear and ear canal normal.  Nose: Nose normal. No mucosal edema or rhinorrhea.  Mouth/Throat: Oropharynx is clear and moist and mucous membranes are normal. No oropharyngeal exudate.  Eyes: Conjunctivae and lids are normal. Pupils are equal, round, and reactive to light.  Neck: Trachea normal. No tracheal deviation present. No thyromegaly present.  Cardiovascular: Normal rate, regular rhythm, S1 normal, S2 normal and normal heart sounds.   No murmur heard. Pulmonary/Chest: Effort normal. No stridor. No tachypnea. No respiratory distress. She has no wheezes. She has no rales. She exhibits no tenderness.  Abdominal: Soft. She exhibits no distension and no mass. There is no hepatosplenomegaly. There is no tenderness. There is no rebound and no guarding.  Musculoskeletal: She exhibits no edema or tenderness.  Lymphadenopathy:       Head (right side): No tonsillar adenopathy present.       Head (left side): No tonsillar adenopathy present.    She has no cervical adenopathy.    She has no axillary adenopathy.  Neurological: She is alert. Gait normal.  Skin: No rash noted. She is not diaphoretic. No erythema. No pallor. Nails show no clubbing.  Psychiatric: Mood and affect normal.    Diagnostics: Allergy skin tests were performed. She demonstrated hypersensitivity to house dust mite.  Spirometry was performed and demonstrated an FEV1 of 2.27 @ 101 % of predicted.  Results of blood tests obtained on 10/30/2016 identified normal hepatic and renal function, white blood cell count 10.6 with a normal differential, absolute eosinophil count 400, hemoglobin 13.9, platelet 332.  Results of blood tests obtained on 05/24/2016 identified a negative Aero allergen profile with a total IgE of 34KU/L.  Results  of a high resolution chest CT scan obtained on 06/12/2016 identified the following:  1. No evidence of interstitial lung disease. No acute pulmonarydisease. 2. Mild patchy air trapping in both lungs, compatible with smallairways disease. 3. Aortic atherosclerosis. Left main and 1 vessel coronaryatherosclerosis.  Results of a sinus CT scan  obtained 06/12/2016 identified the following:  The frontal, maxillary, sphenoid, and ethmoid sinuses are clear.  There is marked nasal septal deviation LEFT-to-RIGHT of 4 mm. No nasal cavity masses. Prominent BILATERAL ethmoidal bullae, contribute to potential BILATERAL infundibular narrowing.  Visualized intracranial compartment and extracranial soft tissues appear unremarkable. No visible facial fracture. Negative appearing orbits.  Results of a methacholine challenge test obtained 08/21/2016 identified no bronchial hyperreactivity at 16 MG/DL.   Assessment and Plan:    1. LPRD (laryngopharyngeal reflux disease)   2. Other allergic rhinitis   3. Eosinophilic bronchitis   4. Eosinophilia     1. Allergen avoidance measures?  2. Treat reflux:   A. consolidate all caffeine and chocolate consumption  B. continue combination of Protonix 40 mg in a.m. and Pepcid 20 mg in PM  3. Treat inflammation:   A. continue Flonase or similar one-2 sprays each nostril daily  B. Start Arnuity 100 - one inhalation 1 time per day. Coupon. Sample  4. If needed:   A. OTC chlorpheniramine   B. Proventil HFA or similar 2 inhalations every 4-6 hours  5. "Action plan" for flareup:   A. increase Protonix to 40 mg twice a day  B. increase Pepcid to 40 mg in PM  C. increase Arnuity to one inhalation twice a day  D. start Mucinex DM 2 tablets twice a day  E. use chlorpheniramine 12 hour tablet twice a day  F. continue Flonase  6. Return to clinic in 12 weeks or earlier if problem  7. Contact clinic if flareup in the future  Lisa Lee has some form of  respiratory tract irritation and I suspect that most of this is related to her reflux-induced respiratory disease. However, she very well may have eosinophilic bronchitis as well given her peripheral eosinophilia and the fact that she really did so much worse with the intensity and frequency of her respiratory episodes since she has had discontinuation of her inhaled steroid over the course of the past year. Eosinophilic bronchitis will present with a negative methacholine challenge test. I am going to have her use not just a fluticasone preparation for her nose but also for her lung at this point and we'll get her to treat her reflux a little more aggressively as noted above. I given her a "action plan" to utilize the next time she develops one of her episodes.  Jessica Priest, MD [facility] [place]

## 2016-10-31 NOTE — Patient Instructions (Addendum)
  1. Allergen avoidance measures?  2. Treat reflux:   A. consolidate all caffeine and chocolate consumption  B. continue combination of Protonix 40 mg in a.m. and Pepcid 20 mg in PM  3. Treat inflammation:   A. continue Flonase or similar one-2 sprays each nostril daily  B. Start Arnuity 100 - one inhalation 1 time per day. Coupon. Sample  4. If needed:   A. OTC chlorpheniramine   B. Proventil HFA or similar 2 inhalations every 4-6 hours  5. "Action plan" for flareup:   A. increase Protonix to 40 mg twice a day  B. increase Pepcid to 40 mg in PM  C. increase Arnuity to one inhalation twice a day  D. start Mucinex DM 2 tablets twice a day  E. use chlorpheniramine 12 hour tablet twice a day  F. continue Flonase  6. Return to clinic in 12 weeks or earlier if problem  7. Contact clinic if flareup in the future

## 2016-11-02 ENCOUNTER — Encounter: Payer: Self-pay | Admitting: Cardiology

## 2016-11-02 DIAGNOSIS — E785 Hyperlipidemia, unspecified: Secondary | ICD-10-CM

## 2016-11-06 MED ORDER — FENOFIBRATE 160 MG PO TABS: 160.0000 mg | ORAL_TABLET | Freq: Every day | ORAL | 0 refills | Status: DC

## 2016-11-06 NOTE — Telephone Encounter (Signed)
Spoke with the pt via [employer].  Pt agreed to recommendations per Dr Delton See, to add fenofibrate 160 mg po daily to her regimen.  Confirmed the pharmacy of choice with the pt.  Pt aware that she needs to come in for a lab appt to recheck a cmet and lipids, for 2 months out.  Pt is aware that she needs to come fasting to that lab appt.  Pt states she will call us back closer to that time, to schedule her lab appt.  Pt states she needs to check her schedule, when it gets closer to that date.   Placed the lab orders in the system, so when pt calls back this can be linked to her lab appt.  Pt verbalized understanding and agrees with this plan.

## 2016-11-06 NOTE — Addendum Note (Signed)
Addended by: Loa Socks on: 11/06/2016 01:29 PM   Modules accepted: Orders

## 2016-12-04 ENCOUNTER — Encounter: Payer: Self-pay | Admitting: Cardiology

## 2016-12-05 ENCOUNTER — Other Ambulatory Visit: Payer: Self-pay | Admitting: *Deleted

## 2016-12-05 MED ORDER — ATORVASTATIN CALCIUM 10 MG PO TABS
10.0000 mg | ORAL_TABLET | Freq: Every day | ORAL | 0 refills | Status: DC
Start: 2016-12-05 — End: 2017-01-07

## 2016-12-06 ENCOUNTER — Other Ambulatory Visit: Payer: Self-pay | Admitting: Cardiology

## 2017-01-07 ENCOUNTER — Telehealth: Payer: Self-pay | Admitting: *Deleted

## 2017-01-07 ENCOUNTER — Other Ambulatory Visit: Payer: BLUE CROSS/BLUE SHIELD | Admitting: *Deleted

## 2017-01-07 ENCOUNTER — Other Ambulatory Visit: Payer: Self-pay | Admitting: Emergency Medicine

## 2017-01-07 DIAGNOSIS — E785 Hyperlipidemia, unspecified: Secondary | ICD-10-CM

## 2017-01-07 LAB — COMPREHENSIVE METABOLIC PANEL
ALT: 31 IU/L (ref 0–32)
AST: 25 IU/L (ref 0–40)
Albumin/Globulin Ratio: 2.1 (ref 1.2–2.2)
Albumin: 4.6 g/dL (ref 3.5–5.5)
Alkaline Phosphatase: 52 IU/L (ref 39–117)
BUN/Creatinine Ratio: 22 (ref 9–23)
BUN: 22 mg/dL (ref 6–24)
Bilirubin Total: 0.4 mg/dL (ref 0.0–1.2)
CO2: 22 mmol/L (ref 20–29)
Calcium: 10.1 mg/dL (ref 8.7–10.2)
Chloride: 102 mmol/L (ref 96–106)
Creatinine, Ser: 1.02 mg/dL — ABNORMAL HIGH (ref 0.57–1.00)
GFR calc Af Amer: 71 mL/min/{1.73_m2} (ref >59)
GFR calc non Af Amer: 61 mL/min/{1.73_m2} (ref >59)
Globulin, Total: 2.2 g/dL (ref 1.5–4.5)
Glucose: 82 mg/dL (ref 65–99)
Potassium: 5.1 mmol/L (ref 3.5–5.2)
Sodium: 137 mmol/L (ref 134–144)
Total Protein: 6.8 g/dL (ref 6.0–8.5)

## 2017-01-07 LAB — LIPID PANEL
Chol/HDL Ratio: 2.7 ratio (ref 0.0–4.4)
Cholesterol, Total: 165 mg/dL (ref 100–199)
HDL: 61 mg/dL (ref >39)
LDL Calculated: 84 mg/dL (ref 0–99)
Triglycerides: 100 mg/dL (ref 0–149)
VLDL Cholesterol Cal: 20 mg/dL (ref 5–40)

## 2017-01-07 MED ORDER — LEVOTHYROXINE SODIUM 75 MCG PO TABS: 75.0000 ug | ORAL_TABLET | Freq: Every day | ORAL | 3 refills | Status: DC

## 2017-01-07 MED ORDER — ATORVASTATIN CALCIUM 10 MG PO TABS: 10.0000 mg | ORAL_TABLET | Freq: Every day | ORAL | 3 refills | Status: DC

## 2017-01-07 NOTE — Telephone Encounter (Signed)
Spoke with the pt and informed her that per Dr Delton SeeNelson, her labs showed great lipids, normal LDL and TG, improved from 409208 -> 100, and she wanted me to ask her how she is tolerating her meds?  Pt states she is tolerating her meds appropriately and will need a 90 day supply [facility].  [facility] refills [facility].  Advised the pt to continue her current regimen and I will let Dr Delton SeeNelson know that she is tolerating her meds appropriately. Pt verbalized understanding and agrees with this plan.

## 2017-01-22 ENCOUNTER — Telehealth: Payer: Self-pay | Admitting: Internal Medicine

## 2017-01-22 NOTE — Telephone Encounter (Signed)
Patient presented on 10/30/16 for her CPE.  Patient was billed the CPE visit but in addition to this she was billed a 25 minute Office Visit.  In reviewing the notes for the visit I see a couple things discussed during this visit but not sure if was intended to be coded as an Office Visit in addition to the CPE or if this was in error.  Can you please advise?

## 2017-01-23 NOTE — Telephone Encounter (Signed)
I cant  tell what dx were used  but the coding was an e 3 (not  25 minutes)   . That  Seemed to address  Medication management prescribing  meds from specialists a  and  Lab monitoring for  Conditions disease states that are over and above  Preventive .   Please have the coders review to  look at this to see if needs to be changed .

## 2017-01-27 ENCOUNTER — Encounter: Payer: Self-pay | Admitting: Cardiology

## 2017-01-28 ENCOUNTER — Other Ambulatory Visit: Payer: Self-pay | Admitting: Cardiology

## 2017-01-28 DIAGNOSIS — E785 Hyperlipidemia, unspecified: Secondary | ICD-10-CM

## 2017-01-28 MED ORDER — FENOFIBRATE 160 MG PO TABS: 160.0000 mg | ORAL_TABLET | Freq: Every day | ORAL | 0 refills | Status: DC

## 2017-01-28 NOTE — Telephone Encounter (Signed)
Pt's medication was sent to pt's pharmacy as requested. Confirmation received.  °

## 2017-01-31 ENCOUNTER — Other Ambulatory Visit: Payer: Self-pay | Admitting: Cardiology

## 2017-01-31 DIAGNOSIS — E785 Hyperlipidemia, unspecified: Secondary | ICD-10-CM

## 2017-02-05 NOTE — Telephone Encounter (Signed)
I don't know if if this is something you can help me with or not, but [name] advised that the coding was "an e 3 (not 25 minutes).  Does that change what the patient would owe?

## 2017-02-05 NOTE — Telephone Encounter (Signed)
The [phone] is coming from the visit from Dr. Sherene SiresWert in 2/18.  [name] did do [phone] w/ the preventive.  You can call me if any questions.  [phone]

## 2017-03-06 ENCOUNTER — Encounter: Payer: Self-pay | Admitting: Internal Medicine

## 2017-03-07 NOTE — Telephone Encounter (Signed)
  MW please advise. Thanks.    Jayli D Conchar-Mabe  to Nyoka CowdenMichael B Wert, MD     7:48 PM  Hello Dr. Sherene SiresWert,  I didn't have a very good experience with Dr. Lucie LeatherKozlow. It was evident that your referral regarding immunology was not reviewed. He did a little breathing test, and a full skin allergy testing. I was only positive for dust mites. You had already down serum allergy testing and pulmonary function test. I left there with a prescription for Arnuity, a diagnosis of eosinophilic asthma and an after insurance bill over $1000. I'm still paying for testing that was not needed.  Here is my question- I have started to cough again. I'm out of Arnuity but I still have Dulera, Asmanex and Brio. I'm now using Brio because the ingredients were closest.   What do you think of this? I don't want to see an allergist to manage asthma when I have a pulmonologist. Do I come to you, or can my PCP manage this?  I appreciate your advice and guidance.  Gae Conchar- Mabe

## 2017-03-13 ENCOUNTER — Encounter: Payer: Self-pay | Admitting: Cardiology

## 2017-03-27 ENCOUNTER — Encounter: Payer: Self-pay | Admitting: Cardiology

## 2017-03-28 NOTE — Telephone Encounter (Signed)
Spoke with the pt on the phone and endorsed to her Dr Lindaann SloughNelson's recommendations for her to stop her Lipitor and start taking Livalo 1 mg po daily.  Informed the pt that I will have a co-pay card for this med, available for her to pick up at the front desk, at her OV appt on 9/5. Per the pt, she prefers to stop taking her Lipitor, see if her symptoms improve, then discuss this in further detail with Dr Delton SeeNelson at her next OV on 9/5. Informed the pt that would be fine, and I will update her chart to reflect her intolerance to Lipitor. Informed the pt that I will make Dr Delton SeeNelson aware of her decision stop her current statin and discuss this in further detail at her next OV on 9/5.  Pt verbalized understanding and agrees with this plan.

## 2017-03-28 NOTE — Telephone Encounter (Signed)
Thank you [name]!  Ivy, could you call her and start livalo 1 mg, please obtain a copay card for her.  Thank you!  KN    ----- Message -----  From: [name], [name], [facility]  Sent: 03/27/2017  1:27 PM  To: Lars MassonKatarina H Nelson, MD  Subject: RE: Non-Urgent Medical Question           Could try Livalo 1mg  or 2mg  daily to see if she tolerates this better. She should qualify for the copay card since she has Nurse, learning disabilitycommercial insurance. She will not currently qualify for PCSK9i due to lack of ASCVD or FH diagnosis. If she is intolerant to low dose Livalo, would try Zetia 10mg  next.    Thanks,  [name]      ----- Message -----  From: Lars MassonNelson, Katarina H, MD  Sent: 03/27/2017 11:22 AM  To: Awilda MetroMegan E Supple, RPH  Subject: FW: Non-Urgent Medical Question             [name],   This patient was intolerant to crestor and pravastatin, tolerating lipitor 10 mg po daily, now intolerant to that. Any suggestions?  Thank you,  KN

## 2017-04-03 ENCOUNTER — Ambulatory Visit (INDEPENDENT_AMBULATORY_CARE_PROVIDER_SITE_OTHER): Payer: BLUE CROSS/BLUE SHIELD | Admitting: Cardiology

## 2017-04-03 ENCOUNTER — Encounter: Payer: Self-pay | Admitting: Cardiology

## 2017-04-03 VITALS — BP 124/66 | HR 68 | Ht 60.0 in | Wt 138.0 lb

## 2017-04-03 DIAGNOSIS — L989 Disorder of the skin and subcutaneous tissue, unspecified: Secondary | ICD-10-CM | POA: Insufficient documentation

## 2017-04-03 DIAGNOSIS — N941 Unspecified dyspareunia: Secondary | ICD-10-CM | POA: Insufficient documentation

## 2017-04-03 DIAGNOSIS — E785 Hyperlipidemia, unspecified: Secondary | ICD-10-CM

## 2017-04-03 DIAGNOSIS — Z789 Other specified health status: Secondary | ICD-10-CM | POA: Diagnosis not present

## 2017-04-03 DIAGNOSIS — N870 Mild cervical dysplasia: Secondary | ICD-10-CM | POA: Insufficient documentation

## 2017-04-03 DIAGNOSIS — B977 Papillomavirus as the cause of diseases classified elsewhere: Secondary | ICD-10-CM | POA: Insufficient documentation

## 2017-04-03 DIAGNOSIS — R21 Rash and other nonspecific skin eruption: Secondary | ICD-10-CM | POA: Insufficient documentation

## 2017-04-03 DIAGNOSIS — N952 Postmenopausal atrophic vaginitis: Secondary | ICD-10-CM | POA: Insufficient documentation

## 2017-04-03 DIAGNOSIS — R87619 Unspecified abnormal cytological findings in specimens from cervix uteri: Secondary | ICD-10-CM | POA: Insufficient documentation

## 2017-04-03 DIAGNOSIS — N951 Menopausal and female climacteric states: Secondary | ICD-10-CM | POA: Insufficient documentation

## 2017-04-03 DIAGNOSIS — A6000 Herpesviral infection of urogenital system, unspecified: Secondary | ICD-10-CM | POA: Insufficient documentation

## 2017-04-03 NOTE — Patient Instructions (Signed)

## 2017-04-03 NOTE — Progress Notes (Signed)
Cardiology Office Note    Date:  04/03/2017   ID:  Lisa Lee, DOB 1961-01-14, MRN [id number]  PCP:  Madelin Headings, MD  Cardiologist:  Tobias Alexander, MD   Chief complain: 4 months follow up  History of Present Illness:  Lisa Lee is a 56 y.o. female with hyperlipidemia and significant FH of premature CAD. She is very active and asymptomatic. She is on carb free diet and recently lost 15 lbs. Denies any SOB, CP, no LE edema, no orthopnea, palpitations or syncope. She underwent a calcium scoring in 2013 that was 0.   04/03/2017 - 4 months follow-up, she feels tired and she is working 5 days a week for 10 hours. She was started rosuvastatin, that she didn't tolerate, then atorvastatin, pravastatin, most recently switched to Livalo 1 mg po daily. However she didn't start using again, she feels that her shoulder pain and hip pain is related to her work rather than medicine. She will stop using it for a week and see if that helps. She has also noticed some mild memory impairment. No significant shortness of breath or chest pain. No lower extremity edema.   Past Medical History:  Diagnosis Date  . Acid reflux   . Allergic rhinitis   . Asthma   . Depression   . Diarrhea 08/09/2011   Poss ibs  Vs other  Related to stress  consdier other eval if needed. Had colonoscoopy per dr Matthias Hughs   . Hemorrhoids   . High cholesterol   . 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 Hypothyroidism   . Medication side effect 11/01/2011   tussionex   distrubed sleep    Past Surgical History:  Procedure Laterality Date  . CERVICAL CONIZATION W/BX N/A 07/21/2015   Procedure: CONIZATION CERVIX WITH BIOPSY;  Surgeon: Kirkland Hun, MD;  Location: [facility];  Service: Gynecology;  Laterality: N/A;  . CERVICAL CRYOTHERAPY  1984  . COLPOSCOPY  08/26/13  . DILATION AND CURETTAGE, DIAGNOSTIC / THERAPEUTIC  1994   Blighted Ovum  . HYSTEROSCOPY     uterine polypectomy Jan 7th  . HYSTEROSCOPY WITH RESECTOSCOPE   08/04/2009   Removed polyp & IUD  . LYMPH NODE BIOPSY    . TONSILLECTOMY      Current Medications: Outpatient Medications Prior to Visit  Medication Sig Dispense Refill  . albuterol (PROAIR HFA) 108 (90 Base) MCG/ACT inhaler Inhale 2 puffs into the lungs every 6 (six) hours as needed for wheezing. 3 Inhaler 0  . aspirin EC 81 MG tablet Take 1 tablet (81 mg total) by mouth daily.    . chlorpheniramine (CHLOR-TRIMETON) 4 MG tablet Take 4 mg by mouth daily as needed for allergies.    Marland Kitchen escitalopram (LEXAPRO) 20 MG tablet Take 1 tablet (20 mg total) by mouth daily. 30 tablet 5  . famotidine (PEPCID) 20 MG tablet One at bedtime 90 tablet 3  . fenofibrate 160 MG tablet Take 1 tablet (160 mg total) by mouth daily. 90 tablet 0  . Fluticasone Furoate (ARNUITY ELLIPTA) 100 MCG/ACT AEPB Inhale 1 Dose into the lungs daily. Rinse, gargle, and spit after use. 30 each 5  . ibuprofen (ADVIL,MOTRIN) 200 MG tablet Take 400 mg by mouth every 6 (six) hours as needed for headache or mild pain.     Marland Kitchen levothyroxine (SYNTHROID, LEVOTHROID) 75 MCG tablet Take 1 tablet (75 mcg total) by mouth daily. 90 tablet 3  . Misc. Devices (ACAPELLA) MISC Use as directed 1 each 0  .  Multiple Vitamins-Minerals (MULTIVITAMIN WITH MINERALS) tablet Take 1 tablet by mouth daily.    . pantoprazole (PROTONIX) 40 MG tablet Take 1 tablet (40 mg total) by mouth daily. 90 tablet 2  . YUVAFEM 10 MCG TABS vaginal tablet     . Doxylamine Succinate, Sleep, (UNISOM PO) Take 1 tablet by mouth at bedtime as needed (for sleep).     . fluticasone (FLONASE) 50 MCG/ACT nasal spray Place into both nostrils daily.    Marland Kitchen lithium carbonate (LITHOBID) 300 MG CR tablet Take 1 tablet (300 mg total) by mouth 2 (two) times daily. (Patient not taking: Reported on 04/03/2017) 180 tablet 0  . mometasone-formoterol (DULERA) 100-5 MCG/ACT AERO Inhale 2 puffs into the lungs 2 (two) times daily. (Patient not taking: Reported on 10/30/2016) 3 Inhaler 3  . montelukast  (SINGULAIR) 10 MG tablet Take 1 tablet (10 mg total) by mouth at bedtime. 90 tablet 3   No facility-administered medications prior to visit.      Allergies:   Penicillins; Atorvastatin; Pravastatin; and Rosuvastatin   Social History   Social History  . Marital status: Married    Spouse name: N/A  . Number of children: N/A  . Years of education: N/A   Social History Main Topics  . Smoking status: Never Smoker  . Smokeless tobacco: Never Used  . Alcohol use 0.0 oz/week     Comment: once a month  . Drug use: No  . Sexual activity: Yes    Birth control/ protection: None   Other Topics Concern  . None   Social History Narrative    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 Father had MI at age 35.  Sister stents at 32, CABG x 4 at 61. Brother at 34 CABG x 3.   ROS:   Please see the history of present illness.    ROS All other systems reviewed and are negative.  PHYSICAL EXAM:   VS:  BP 124/66   Pulse 68   Ht 5' (1.524 m)   Wt 138 lb (62.6 kg)   LMP 12/09/2013   BMI 26.95 kg/m    GEN: Well nourished, well developed, in no acute distress  HEENT: normal  Neck: no JVD, carotid bruits, or masses Cardiac: RRR; no murmurs, rubs, or gallops,no edema  Respiratory:  clear to auscultation bilaterally, normal work of breathing GI: soft, nontender, nondistended, + BS MS: no deformity or atrophy  Skin: warm and dry, no rash Neuro:  Alert and Oriented x 3, Strength and sensation are intact Psych: euthymic  mood, full affect  Wt Readings from Last 3 Encounters:  04/03/17 138 lb (62.6 kg)  10/31/16 137 lb 9.6 oz (62.4 kg)  10/30/16 137 lb 9.6 oz (62.4 kg)    Studies/Labs Reviewed:   EKG:  EKG is ordered and shows SR, normal ECG.  Recent Labs: 10/30/2016: Hemoglobin 13.9; Platelets 332.0; TSH 2.14 01/07/2017: ALT 31; BUN 22; Creatinine, Ser 1.02; Potassium 5.1; Sodium  137   Lipid Panel    Component Value Date/Time   CHOL 165 01/07/2017 1117   TRIG 100 01/07/2017 1117   HDL 61 01/07/2017 1117   CHOLHDL 2.7 01/07/2017 1117   CHOLHDL 3 10/30/2016 1051   VLDL 41.6 (H) 10/30/2016 1051   LDLCALC 84 01/07/2017 1117   LDLDIRECT 101.0 10/30/2016 1051    Additional studies/ records that were reviewed today include:  Calcium scoring in 2013: 0  EKG performed today 03/07/2017 shows normal sinus rhythm and normal EKG. This was personally reviewed.  ASSESSMENT:    1. Hyperlipidemia, unspecified hyperlipidemia type   2. Statin intolerance      PLAN:  In order of problems listed above:  1. The patient is active and completely asymptomatic. Her family h/o early CAD is very significant however the fact that she had normal calcium score 5 years ago is reassuring.She is advised to find time for herself and start yoga classes once a week. 2. She will hold Lipitor and restart if no improvement in her symptoms. Her lipids were at acceptable ranges when checked in June 2018.  3. She has no signs of cholesterol deposits - xanthomas, xanthelasmas. 4. She will have her TSH rechecked by her primary care physician as her lithium was recently increased.   Medication Adjustments/Labs and Tests Ordered: Current medicines are reviewed at length with the patient today.  Concerns regarding medicines are outlined above.  Medication changes, Labs and Tests ordered today are listed in the Patient Instructions below. Patient Instructions  Medication Instructions:   Your physician recommends that you continue  on your current medications as directed. Please refer to the Current Medication list given to you today.    Follow-Up:  Your physician wants you to follow-up in: 6 MONTHS WITH DR Johnell ComingsNELSON You will receive a reminder letter in the mail two months in advance. If you don't receive a letter, please call our office to schedule the follow-up appointment.        If you need a refill on your cardiac medications before your next appointment, please call your pharmacy.      Signed, Tobias AlexanderKatarina Shiree Altemus, MD  04/03/2017 9:07 AM    [facility] [address], [place], [place]  [address] Phone: [phone]; Fax: [phone]

## 2017-04-15 ENCOUNTER — Encounter: Payer: Self-pay | Admitting: Internal Medicine

## 2017-04-15 MED ORDER — MOMETASONE FUROATE 100 MCG/ACT IN AERO: 2.0000 | INHALATION_SPRAY | Freq: Two times a day (BID) | RESPIRATORY_TRACT | 2 refills | Status: DC

## 2017-04-19 ENCOUNTER — Encounter: Payer: Self-pay | Admitting: Internal Medicine

## 2017-04-28 ENCOUNTER — Other Ambulatory Visit: Payer: Self-pay | Admitting: Cardiology

## 2017-04-28 DIAGNOSIS — E785 Hyperlipidemia, unspecified: Secondary | ICD-10-CM

## 2017-05-03 ENCOUNTER — Ambulatory Visit (INDEPENDENT_AMBULATORY_CARE_PROVIDER_SITE_OTHER): Payer: BLUE CROSS/BLUE SHIELD | Admitting: Internal Medicine

## 2017-05-03 ENCOUNTER — Encounter: Payer: Self-pay | Admitting: Internal Medicine

## 2017-05-03 VITALS — BP 132/66 | HR 75 | Ht 60.0 in | Wt 140.0 lb

## 2017-05-03 DIAGNOSIS — Z23 Encounter for immunization: Secondary | ICD-10-CM | POA: Diagnosis not present

## 2017-05-03 DIAGNOSIS — J45991 Cough variant asthma: Secondary | ICD-10-CM | POA: Diagnosis not present

## 2017-05-03 DIAGNOSIS — R05 Cough: Secondary | ICD-10-CM | POA: Diagnosis not present

## 2017-05-03 DIAGNOSIS — R058 Other specified cough: Secondary | ICD-10-CM

## 2017-05-03 MED ORDER — GABAPENTIN 100 MG PO CAPS: 100.0000 mg | ORAL_CAPSULE | Freq: Three times a day (TID) | ORAL | 2 refills | Status: DC

## 2017-05-03 NOTE — Progress Notes (Signed)
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 Cough p ex as child but after childbirth 1991 noct cough >  Bad symptoms of cough /wheeze immediately p MCT in ? 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 Follow-up    Cough is much improved.   some throat clearing since working outside, convinced she has allergies/ better with 1st gen h1, still on dulera 100 one bid  rec Try to clear your throat - chlorpheniramine and hard rock candy will help Please see patient coordinator before you leave today  to schedule [place] evaluation but hold antihistamines first try off dulera to what difference if any this makes - ok to restart if worse  Call if neg work up for trial of gabapentin 100 mg three times day      05/03/2017  Extended  f/u ov/Quinetta Shilling re: re-establish UACS  - cough x 27 years, esp since 2010  Chief Complaint  Patient presents with  . Acute Visit    Pt states her cough has been worse for the past 3 months.    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 No unusual exp hx or h/o childhood pna/ asthma or knowledge of premature birth.  Sleeping ok flat without nocturnal  or early am exacerbation  of respiratory  c/o's or need for noct saba. Also denies any obvious fluctuation of symptoms with weather or environmental changes or other aggravating or alleviating factors except as outlined above   Current Allergies, Complete Past Medical History, Past Surgical History, Family History, and Social History were reviewed in [employer] record.  ROS  The following are not active complaints unless bolded Hoarseness, sore throat, dysphagia, dental problems, itching, sneezing,  nasal congestion or discharge of excess mucus or purulent secretions, ear ache,   fever, chills, sweats, unintended wt loss or wt gain, classically pleuritic or  exertional cp,  orthopnea pnd or leg swelling, presyncope, palpitations, abdominal pain, anorexia, nausea, vomiting, diarrhea  or change in bowel habits or change in bladder habits, change in stools or change in urine, dysuria, hematuria,  rash, arthralgias, visual complaints, headache, numbness, weakness or ataxia or problems with walking or coordination,  change in mood/affect or memory.        Current Meds  Medication Sig  . albuterol (PROAIR HFA) 108 (90 Base) MCG/ACT inhaler Inhale 2 puffs into the lungs every 6 (six) hours as needed for wheezing.  Marland Kitchen aspirin EC 81 MG tablet Take 1 tablet (81 mg total) by mouth daily.  Marland Kitchen atorvastatin (LIPITOR) 10 MG tablet Take 10 mg by mouth daily.  . chlorpheniramine (CHLOR-TRIMETON) 4 MG  tablet Take 4 mg by mouth daily as needed for allergies.  Marland Kitchen dextromethorphan-guaiFENesin (MUCINEX DM) 30-600 MG 12hr tablet Take 1 tablet by mouth 2 (two) times daily as needed for cough.  . doxylamine, Sleep, (UNISOM) 25 MG tablet Take 25 mg by mouth at bedtime as needed.  Marland Kitchen escitalopram (LEXAPRO) 20 MG tablet Take 30 mg by mouth daily.  . famotidine (PEPCID) 20 MG tablet One at bedtime  . fenofibrate 160 MG tablet TAKE 1 TABLET DAILY (KEEP UPCOMING APPOINTMENT FOR FUTURE REFILLS)  . ibuprofen (ADVIL,MOTRIN) 200 MG tablet Take 400 mg by mouth every 6 (six) hours as needed for headache or mild pain.   Marland Kitchen levothyroxine (SYNTHROID, LEVOTHROID) 75 MCG tablet Take 1 tablet (75 mcg total) by mouth daily.  Marland Kitchen lithium carbonate (LITHOBID) 300 MG CR tablet 2 every am and 1 every pm  . Misc. Devices (ACAPELLA) MISC Use as directed  . Mometasone Furoate (ASMANEX HFA) 100 MCG/ACT AERO Inhale 2 puffs into the lungs 2 (two) times daily.  . Multiple Vitamins-Minerals (MULTIVITAMIN WITH MINERALS) tablet Take 1 tablet by mouth daily.  . pantoprazole (PROTONIX) 40 MG tablet Take 1 tablet (40 mg total) by mouth daily.  . phenylephrine (SUDAFED PE) 10 MG TABS tablet Take 10 mg by mouth every 4 (four) hours as needed.  [name] 10 MCG TABS vaginal tablet Take 1 tablet by mouth every 14 (fourteen) days.                       Objective:   Physical Exam    amb wf nad   With  occ  throat clearing and unusual almost helpless affect  09/24/2016        134  08/13/2016        136  07/19/2016      135 06/07/2016        133 05/16/16 134 lb (60.8 kg)  04/16/16 135 lb 3.2 oz (61.3 kg)  03/27/16 134 lb (60.8 kg)    Vital signs reviewed - Note on arrival 02 sats  97% on RA     HEENT: nl dentition, turbinates, and oropharynx which is pristine . Nl external ear canals without cough reflex   NECK :  without JVD/Nodes/TM/ nl carotid upstrokes bilaterally   LUNGS: no acc muscle use,  Nl contour chest   Perfectly clear bilaterally with  No reproducible cough on insp or exp   CV:  RRR  no s3 or murmur or increase in P2, no edema   ABD:  soft and nontender with nl inspiratory excursion in the supine position. No bruits or organomegaly, bowel sounds nl  MS:  Nl gait/ ext warm without deformities, calf tenderness, cyanosis or clubbing No obvious  joint restrictions   SKIN: warm and dry without lesions    NEURO:  alert, approp, nl sensorium with  no motor deficits           Assessment:

## 2017-05-03 NOTE — Patient Instructions (Addendum)
Gabapentin 100 mg three times a day   For drainage / throat tickle try take CHLORPHENIRAMINE  4 mg - take one every 4 hours as needed - available over the counter- may cause drowsiness so start with just a bedtime dose or two and see how you tolerate it before trying in daytime    No change in acid suppression or diet or asthmanex dosing for now    See Tammy NP 4 weeks with all your medications, even over the counter meds, separated in two separate bags, the ones you take no matter what vs the ones you stop once you feel better and take only as needed when you feel you need them.   Tammy  will generate for you a new user friendly medication calendar that will put [place] all on the same page re: your medication use.     Without this process, it simply isn't possible to assure that we are providing  your outpatient care  with  the attention to detail we feel you deserve.   If we cannot assure that you're getting that kind of care,  then we cannot manage your problem effectively from this clinic.  Once you have seen Tammy and we are sure that we're all on the same page with your medication use she will arrange follow up with me.

## 2017-05-04 NOTE — Assessment & Plan Note (Signed)
Onset after childbirth 1991 - flutter valve rx  05/16/2016 >>> - Sinus CT 06/12/2016 No evidence for acute or chronic paranasal sinus disease. - CT chest hrct 06/12/2016 wnl x coronary calcium  - referred to [facility] 09/24/2016  > ? Better on arnuity suggesting component of eos bronchitis      - trial of gabapentin 100 tid 05/03/2017 >>>  I had an extended discussion with the patient reviewing all relevant studies completed to date and  lasting 25 minutes of a 40  minute office  visit to re-establish   re  severe non-specific but potentially very serious refractory respiratory symptoms of uncertain and potentially multiple  Etiologies.  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  Please see AVS for specific  Instructions which are unique to this visit and I personally typed out  which were reviewed in detail in writing with the patient and a copy provided.       Marland Kitchen

## 2017-05-04 NOTE — Assessment & Plan Note (Signed)
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 05/16/2016  rec Trial off breo and on dulera 100 2bid - FENO 05/24/2016  =   30 on dulera 100 2bid despite poor hfa - Allergy profile 05/24/2016 >  Eos 0.4 /  IgE 34 Neg RAST   - Repeat MCT  08/21/16 > neg > rec 09/24/2016 try off dulera  - allergy eval 10/31/16 Dr Lucie Leather > c/w LPR - reported better on arnuity but still throat clearing 05/03/2017 > changed to asmanex 100 2bid  - 05/03/2017  After extensive coaching HFA effectiveness =    90%  The reported response to ICS wth neg MCT is suggestive of Eos bronchitis so reasonable to continue low dose ics but avoid DPI since can aggravate her other major problem uacs (see separate a/p)

## 2017-05-06 ENCOUNTER — Other Ambulatory Visit: Payer: Self-pay | Admitting: Internal Medicine

## 2017-05-07 NOTE — Telephone Encounter (Signed)
Patient sees Dr Sherene Sires at Pulmonary, please advise if you want to defer refill to Dr Sherene Sires or refill this month?

## 2017-05-07 NOTE — Telephone Encounter (Signed)
Yes please have dr Sherene Sires and team  Refill medication as   They  advise.

## 2017-06-04 ENCOUNTER — Encounter: Payer: Self-pay | Admitting: Adult Health

## 2017-06-04 ENCOUNTER — Ambulatory Visit (INDEPENDENT_AMBULATORY_CARE_PROVIDER_SITE_OTHER): Payer: BLUE CROSS/BLUE SHIELD | Admitting: Adult Health

## 2017-06-04 DIAGNOSIS — R05 Cough: Secondary | ICD-10-CM

## 2017-06-04 DIAGNOSIS — R058 Other specified cough: Secondary | ICD-10-CM

## 2017-06-04 DIAGNOSIS — J45991 Cough variant asthma: Secondary | ICD-10-CM | POA: Diagnosis not present

## 2017-06-04 NOTE — Assessment & Plan Note (Signed)
Improved control on Gabapentin .  Cont on cough control regimen  Patient Instructions  Continue on current regimen  follow up Dr. Sherene SiresWert  In 6-8 weeks and As needed

## 2017-06-04 NOTE — Progress Notes (Signed)
 @Patient  ID: Lisa Lee, female    DOB: 05/22/1961, 56 y.o.   MRN: [id number]  Chief Complaint  Patient presents with  . 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 Cough p ex as child but after childbirth 1991 noct cough >  Bad symptoms of cough /wheeze immediately p MCT in ? 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 Last ov she was started on Gabapentin 100mg  Three times a day  . Says cough is much better. After few days noticed that she was coughing much less. Could only take Twice daily  Due to sleepiness.  Taking Chlortabs As needed  Drainage .  Not having to use Mucinex DM much at all.  Denies fever, chest pain, orthopnea or edema.  We reviewed her meds and updated her MAR.  Appears to be taking correctly.    Allergies  Allergen Reactions  . Penicillins Hives and Shortness Of Breath    Has patient had a PCN reaction causing immediate rash, facial/tongue/throat swelling, SOB or lightheadedness with hypotension: Yes Has patient had a PCN reaction causing severe rash involving mucus membranes or skin necrosis: No Has patient had a PCN reaction that required hospitalization No Has patient had a PCN reaction occurring within the last 10 years: No If all of the above answers are "NO", then may proceed with Cephalosporin use.   . Atorvastatin Other (See Comments)    Causes lower extremity muscle aches  . Pravastatin Other (See  Comments)    Cause muscle aches and cramps  . Rosuvastatin Other (See Comments)    Caused pain in hands, shoulders, back, and indigestion    Immunization History  Administered Date(s) Administered  . Influenza,inj,Quad PF,6+ Mos 04/29/2013, 04/16/2016, 05/03/2017  . PPD Test 10/05/2013  . Pneumococcal Conjugate-13 10/07/2013  . Td 07/30/2002    Past Medical History:  Diagnosis Date  . Acid reflux   . Allergic rhinitis   . Asthma   . Depression   . Diarrhea 08/09/2011   Poss ibs  Vs other  Related to stress  consdier other eval if needed. Had colonoscoopy per Lisa Lee   . Hemorrhoids   . High cholesterol   . 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 Hypothyroidism   . Medication side effect 11/01/2011   tussionex   distrubed sleep     Tobacco History: Social History   Tobacco Use  Smoking Status Never Smoker  Smokeless Tobacco Never Used   Counseling given: Not Answered   Outpatient Encounter Medications as of 06/04/2017  Medication Sig  . albuterol (PROAIR HFA) 108 (90 Base) MCG/ACT inhaler Inhale 2 puffs into the lungs every 6 (six) hours as needed for wheezing.  Marland Kitchen. aspirin EC 81 MG tablet Take 1 tablet (81 mg total) by mouth daily.  .Marland Kitchen  atorvastatin (LIPITOR) 10 MG tablet Take 10 mg by mouth daily.  . chlorpheniramine (CHLOR-TRIMETON) 4 MG tablet Take 4 mg by mouth daily as needed for allergies.  Marland Kitchen. dextromethorphan-guaiFENesin (MUCINEX DM) 30-600 MG 12hr tablet Take 1 tablet by mouth 2 (two) times daily as needed for cough.  . doxylamine, Sleep, (UNISOM) 25 MG tablet Take 25 mg by mouth at bedtime as needed.  Marland Kitchen. escitalopram (LEXAPRO) 20 MG tablet Take 30 mg by mouth daily.  . famotidine (PEPCID) 20 MG tablet One at bedtime  . fenofibrate 160 MG tablet TAKE 1 TABLET DAILY (KEEP UPCOMING APPOINTMENT FOR FUTURE REFILLS)  . gabapentin (NEURONTIN) 100 MG capsule Take 1 capsule (100 mg total) by mouth 3 (three) times daily. One three times daily  . ibuprofen (ADVIL,MOTRIN) 200 MG tablet Take  400 mg by mouth every 6 (six) hours as needed for headache or mild pain.   Marland Kitchen. levothyroxine (SYNTHROID, LEVOTHROID) 75 MCG tablet Take 1 tablet (75 mcg total) by mouth daily.  Marland Kitchen. lithium carbonate (LITHOBID) 300 MG CR tablet 2 every am and 1 every pm  . lithium carbonate 150 MG capsule Take 150 mg by mouth every morning.  . Misc. Devices (ACAPELLA) MISC Use as directed  . Mometasone Furoate (ASMANEX HFA) 100 MCG/ACT AERO Inhale 2 puffs into the lungs 2 (two) times daily.  . Multiple Vitamins-Minerals (MULTIVITAMIN WITH MINERALS) tablet Take 1 tablet by mouth daily.  . pantoprazole (PROTONIX) 40 MG tablet Take 1 tablet (40 mg total) by mouth daily.  . phenylephrine (SUDAFED PE) 10 MG TABS tablet Take 10 mg by mouth every 4 (four) hours as needed.  [name]. YUVAFEM 10 MCG TABS vaginal tablet Take 1 tablet by mouth every 14 (fourteen) days.    No facility-administered encounter medications on file as of 06/04/2017.      Review of Systems  Constitutional:   No  weight loss, night sweats,  Fevers, chills, fatigue, or  lassitude.  HEENT:   No headaches,  Difficulty swallowing,  Tooth/dental problems, or  Sore throat,                No sneezing, itching, ear ache,  +nasal congestion, post nasal drip,   CV:  No chest pain,  Orthopnea, PND, swelling in lower extremities, anasarca, dizziness, palpitations, syncope.   GI  No heartburn, indigestion, abdominal pain, nausea, vomiting, diarrhea, change in bowel habits, loss of appetite, bloody stools.   Resp: No shortness of breath with exertion or at rest.  No excess mucus, no productive cough,  No non-productive cough,  No coughing up of blood.  No change in color of mucus.  No wheezing.  No chest wall deformity  Skin: no rash or lesions.  GU: no dysuria, change in color of urine, no urgency or frequency.  No flank pain, no hematuria   MS:  No joint pain or swelling.  No decreased range of motion.  No back pain.    Physical Exam  BP 112/78 (BP  Location: Left Arm, Cuff Size: Normal)   Pulse 71   Ht 5\' 1"  (1.549 m)   Wt 138 lb 6.4 oz (62.8 kg)   LMP 12/09/2013   SpO2 98%   BMI 26.15 kg/m   GEN: A/Ox3; pleasant , NAD, well nourished   HEENT:  [place]/AT,  EACs-clear, TMs-wnl, NOSE-clear, THROAT-clear, no lesions, no postnasal drip or exudate noted.   NECK:  Supple w/ fair ROM; no JVD; normal carotid impulses w/o bruits; no thyromegaly or nodules palpated; no lymphadenopathy.  RESP  Clear  P & A; w/o, wheezes/ rales/ or rhonchi. no accessory muscle use, no dullness to percussion  CARD:  RRR, no m/r/g, no peripheral edema, pulses intact, no cyanosis or clubbing.  GI:   Soft & nt; nml bowel sounds; no organomegaly or masses detected.   Musco: Warm bil, no deformities or joint swelling noted.   Neuro: alert, no focal deficits noted.    Skin: Warm, no lesions or rashes    Lab Results:  CBC  BMET   BNP No results found for: BNP  ProBNP No results found for: PROBNP  Imaging: No results found.   Assessment & Plan:   Cough variant asthma  vs  Eos bronchitis Controlled on current regimen   Plan  Patient Instructions  Continue on current regimen  follow up Lisa. Sherene Sires  In 6-8 weeks and As needed      Upper airway cough syndrome Improved control on Gabapentin .  Cont on cough control regimen  Patient Instructions  Continue on current regimen  follow up Lisa. Sherene Sires  In 6-8 weeks and As needed         Rubye Oaks, NP 06/04/2017

## 2017-06-04 NOTE — Patient Instructions (Addendum)
Continue on current regimen  follow up Dr. Sherene SiresWert  In 6-8 weeks and As needed

## 2017-06-04 NOTE — Progress Notes (Signed)
Chart and office note reviewed in detail  > agree with a/p as outlined    

## 2017-06-04 NOTE — Assessment & Plan Note (Signed)
Controlled on current regimen   Plan  Patient Instructions  Continue on current regimen  follow up Dr. Sherene SiresWert  In 6-8 weeks and As needed

## 2017-06-24 ENCOUNTER — Other Ambulatory Visit: Payer: Self-pay | Admitting: Internal Medicine

## 2017-06-25 ENCOUNTER — Other Ambulatory Visit: Payer: Self-pay | Admitting: Obstetrics and Gynecology

## 2017-07-02 ENCOUNTER — Encounter: Payer: Self-pay | Admitting: Internal Medicine

## 2017-07-16 ENCOUNTER — Encounter: Payer: Self-pay | Admitting: Internal Medicine

## 2017-07-16 ENCOUNTER — Ambulatory Visit (INDEPENDENT_AMBULATORY_CARE_PROVIDER_SITE_OTHER): Payer: BLUE CROSS/BLUE SHIELD | Admitting: Internal Medicine

## 2017-07-16 VITALS — BP 134/72 | HR 70 | Ht 61.0 in | Wt 140.4 lb

## 2017-07-16 DIAGNOSIS — J45991 Cough variant asthma: Secondary | ICD-10-CM | POA: Diagnosis not present

## 2017-07-16 DIAGNOSIS — R05 Cough: Secondary | ICD-10-CM

## 2017-07-16 DIAGNOSIS — R058 Other specified cough: Secondary | ICD-10-CM

## 2017-07-16 NOTE — Patient Instructions (Addendum)
No change in medications except try off asmanex to see if you notice any difference and if you get worse then restart   Please schedule a follow up visit in 3 months but call sooner if needed

## 2017-07-16 NOTE — Progress Notes (Signed)
Subjective:     Patient ID: Lisa Lee, female   DOB: 09/08/1960,    MRN: [id number]     Brief patient profile:  55 yowf NP/pediatrics   Never smoker ? Cough p ex as child but after childbirth 1991 noct cough >  Bad symptoms of cough /wheeze immediately p MCT in ? 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 Follow-up    Cough is much improved.   some throat clearing since working outside, convinced she has allergies/ better with 1st gen h1, still on dulera 100 one bid  rec Try to clear your throat - chlorpheniramine and hard rock candy will help Please see patient coordinator before you leave today  to schedule [facility]low evaluation but hold antihistamines first try off dulera to what difference if any this makes - ok to restart if worse  Call if neg work up for trial of gabapentin 100 mg three times day      05/03/2017  Extended  f/u ov/Lisa Lee re: re-establish UACS  - cough x 27 years, esp since 2010  Chief Complaint  Patient presents with  . Acute Visit    Pt states her cough has been worse for the past 3 months.    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 at the time the cough was gone Thinks maybe was Maintaining on chlortrimeton hs and arnuity  one daily / ppi and hs but not flonase  Pattern now = no noct cough but Wakes up 5 am then starts the throat clearing and dry cough  Not limited by breathing from desired activities   rec Gabapentin 100 mg three times a day  For drainage / throat tickle try take CHLORPHENIRAMINE  4 mg - take one every 4 hours as needed - available over the counter- may cause drowsiness so start with just a bedtime dose or two and see how you tolerate it before trying in daytime   No change in acid suppression or diet or asthmanex dosing for now     07/16/2017  f/u ov/Lisa Lee re:  Cough x 27 years finally gone on gabapentin 100 mg at bedtime  Chief Complaint  Patient presents with  . Follow-up    Breathing is doing well and she is no longer coughing. She has not had to use her albuterol inhaler.    wakes up feeling rested after 6-7 hours using both 1st gen H1 blockers per guidelines  And gabapentin 100 at hs No sob at all though no aerobics / cut down her asmanex on her own  to 1 bid/ no need for saba   No obvious day to day or daytime variability or assoc excess/ purulent sputum or mucus plugs or hemoptysis or cp or chest tightness, subjective wheeze or overt sinus or hb symptoms. No unusual exposure hx or h/o childhood pna/ asthma or knowledge of premature birth.  Sleeping ok flat without nocturnal  or early am exacerbation  of respiratory  c/o's or need for noct saba. Also denies any obvious fluctuation of symptoms with weather or environmental changes or other aggravating or alleviating factors except as outlined above   Current Allergies, Complete Past Medical History, Past Surgical History, Family History, and Social History were reviewed in [facility] electronic medical record.  ROS  The following are not active complaints unless bolded Hoarseness, sore throat, dysphagia, dental problems, itching, sneezing,  nasal congestion or discharge of excess mucus or purulent secretions, ear ache,   fever,  chills, sweats, unintended wt loss or wt gain, classically pleuritic or exertional cp,  orthopnea pnd or leg swelling, presyncope, palpitations, abdominal pain, anorexia, nausea, vomiting, diarrhea  or change in bowel habits or change in bladder habits, change in stools or change in urine, dysuria, hematuria,  rash, arthralgias, visual complaints, headache, numbness, weakness or ataxia or problems with walking or coordination,  change in mood/affect or memory.        Current Meds  Medication Sig  . albuterol (PROAIR HFA) 108 (90 Base) MCG/ACT inhaler Inhale 2 puffs into the lungs every 6 (six) hours as needed for wheezing.  Marland Kitchen. aspirin EC 81 MG tablet Take 1 tablet (81 mg total) by mouth daily.  Marland Kitchen. atorvastatin (LIPITOR) 10 MG tablet Take 10 mg by mouth daily.  . chlorpheniramine (CHLOR-TRIMETON) 4 MG tablet Take 4 mg by mouth daily as needed for allergies.  Marland Kitchen. dextromethorphan-guaiFENesin (MUCINEX DM) 30-600 MG 12hr tablet Take 1 tablet by mouth 2 (two) times daily as needed for cough.  . doxylamine, Sleep, (UNISOM) 25 MG tablet Take 25 mg by mouth at bedtime as needed.  Marland Kitchen. escitalopram (LEXAPRO) 20 MG tablet Take 30 mg by mouth daily.  . famotidine (PEPCID) 20 MG tablet TAKE 1 TABLET AT BEDTIME  . fenofibrate 160 MG tablet TAKE 1 TABLET DAILY (KEEP UPCOMING APPOINTMENT FOR FUTURE REFILLS)  . gabapentin (NEURONTIN) 100 MG capsule Take 100 mg by mouth daily.  Marland Kitchen. ibuprofen (ADVIL,MOTRIN) 200 MG tablet Take 400 mg by mouth every 6 (six) hours as needed for headache or mild pain.   Marland Kitchen. levothyroxine (SYNTHROID, LEVOTHROID) 75 MCG tablet Take 1 tablet (75 mcg total) by mouth daily.  Marland Kitchen. lithium carbonate (LITHOBID) 300 MG CR tablet 2 every am and 1 every pm  . lithium carbonate 150 MG capsule Take 150 mg by mouth every morning.  . Misc. Devices (ACAPELLA) MISC Use as directed  . Multiple Vitamins-Minerals (MULTIVITAMIN WITH MINERALS) tablet Take 1 tablet by mouth daily.  . pantoprazole (PROTONIX) 40 MG tablet  Take 1 tablet (40 mg total) by mouth daily.  . phenylephrine (SUDAFED PE) 10 MG TABS tablet Take 10 mg by mouth every 4 (four) hours as needed.  [name]. YUVAFEM 10 MCG TABS vaginal tablet Place 1 tablet vaginally every 14 (fourteen) days.   .   Mometasone Furoate (ASMANEX HFA) 100 MCG/ACT AERO Inhale 1 puff into the lungs 2 (two) times daily.                              Objective:  Physical Exam    amb wf/ all smiles      07/16/2017      140  09/24/2016        134  08/13/2016        136  07/19/2016      135 06/07/2016        133 05/16/16 134 lb (60.8 kg)  04/16/16 135 lb 3.2 oz (61.3 kg)  03/27/16 134 lb (60.8 kg)      Vital signs reviewed - Note on arrival 02 sats  96% on  RA     HEENT: nl dentition, turbinates bilaterally, and oropharynx. Nl external ear canals without cough reflex   NECK :  without JVD/Nodes/TM/ nl carotid upstrokes bilaterally   LUNGS: no acc muscle use,  Nl contour chest which is clear to A and P bilaterally without cough on insp or exp maneuvers   CV:  RRR  no s3 or murmur or increase in P2, and no edema   ABD:  soft and nontender with nl inspiratory excursion in the supine position. No bruits or organomegaly appreciated, bowel sounds nl  MS:  Nl gait/ ext warm without deformities, calf tenderness, cyanosis or clubbing No obvious joint restrictions   SKIN: warm and dry without lesions    NEURO:  alert, approp, nl sensorium with  no motor or cerebellar deficits apparent.             Assessment:

## 2017-07-17 ENCOUNTER — Encounter: Payer: Self-pay | Admitting: Internal Medicine

## 2017-07-17 NOTE — Assessment & Plan Note (Addendum)
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 05/16/2016  rec Trial off breo and on dulera 100 2bid - FENO 05/24/2016  =   30 on dulera 100 2bid despite poor hfa - Allergy profile 05/24/2016 >  Eos 0.4 /  IgE 34 Neg RAST   - Repeat MCT  08/21/16 > neg > rec 09/24/2016 try off dulera  - allergy eval 10/31/16 Dr Lucie [facility]atherKozlow > c/w LPR - reported better on arnuity but still throat clearing 05/03/2017 > changed to asmanex 100 2bid  - 05/03/2017  After extensive coaching HFA effectiveness =    90%  - 07/16/2017 try off asmanex as already had tapered down to one bid s flare   NB the  ramp to expected improvement in symptoms from an empiric trial of ICS and for that matter, worsening, if a chronic effective medication is stopped)  can be measured in weeks, not days, a common misconception  so that response to therapy or lack thereof can be very difficult to assess especially if the patient is not adherent to the treatment plan which includes all the other meds for uacs/ reviewed

## 2017-07-17 NOTE — Assessment & Plan Note (Signed)
Onset after childbirth 1991 - flutter valve rx  05/16/2016 >>> - Sinus CT 06/12/2016 No evidence for acute or chronic paranasal sinus disease. - CT chest hrct 06/12/2016 wnl x coronary calcium  [facility]  > ? Better on arnuity suggesting component of eos bronchitis      - trial of gabapentin 100 tid 05/03/2017 >  Cough resolved, could not tol tid but did not recur p taper to 100 hs only    After 27 years she's finally convinced on the right track and now interested in tapering meds but I'm reluctant to back off of an of the uacs meds yet > see cough variant asthma a/p since this is the much less likely source for cough  I had an extended discussion with the patient reviewing all relevant studies completed to date and  lasting 15 to 20 minutes of a 25 minute visit    Each maintenance medication was reviewed in detail including most importantly the difference between maintenance and prns and under what circumstances the prns are to be triggered using an action plan format that is not reflected in the computer generated alphabetically organized AVS.    Please see AVS for specific instructions unique to this visit that I personally wrote and verbalized to the the pt in detail and then reviewed with pt  by my nurse highlighting any  changes in therapy recommended at today's visit to their plan of care.

## 2017-08-04 ENCOUNTER — Other Ambulatory Visit: Payer: Self-pay | Admitting: Internal Medicine

## 2017-09-30 ENCOUNTER — Other Ambulatory Visit: Payer: Self-pay | Admitting: Internal Medicine

## 2017-09-30 MED ORDER — GABAPENTIN 100 MG PO CAPS: 100.0000 mg | ORAL_CAPSULE | Freq: Every day | ORAL | 0 refills | Status: DC

## 2017-10-14 ENCOUNTER — Encounter: Payer: Self-pay | Admitting: Internal Medicine

## 2017-10-14 ENCOUNTER — Ambulatory Visit: Payer: BLUE CROSS/BLUE SHIELD | Admitting: Internal Medicine

## 2017-10-14 VITALS — BP 120/72 | HR 74 | Temp 98.1°F | Wt 140.0 lb

## 2017-10-14 DIAGNOSIS — R05 Cough: Secondary | ICD-10-CM | POA: Diagnosis not present

## 2017-10-14 DIAGNOSIS — R059 Cough, unspecified: Secondary | ICD-10-CM

## 2017-10-14 DIAGNOSIS — J029 Acute pharyngitis, unspecified: Secondary | ICD-10-CM

## 2017-10-14 LAB — POCT RAPID STREP A (OFFICE): RAPID STREP A SCREEN: NEGATIVE

## 2017-10-14 LAB — POCT INFLUENZA A/B
INFLUENZA A, POC: NEGATIVE
INFLUENZA B, POC: NEGATIVE

## 2017-10-14 NOTE — Progress Notes (Signed)
Chief Complaint  Patient presents with  . Sore Throat    HPI: Lisa Lee 57 y.o.   sda     To go on vacation and out of town  And  Sick last evening and cough and st.    Takes care of pat on trach who had a fever . Patient    Had   Ear infection .  No wheezing  .   SOB  Hx of cough and  Gabapentin   100  And helped   And not  Since then .  ROS: See pertinent positives and negatives per HPI.  Past Medical History:  Diagnosis Date  . Acid reflux   . Allergic rhinitis   . Asthma   . Depression   . Diarrhea 08/09/2011   Poss ibs  Vs other  Related to stress  consdier other eval if needed. Had colonoscoopy per dr Lisa Lee   . Hemorrhoids   . High cholesterol   . 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 Hypothyroidism   . Medication side effect 11/01/2011   tussionex   distrubed sleep     Family History  Problem Relation Age of Onset  . Heart disease Sister [id number]       cabg stent  . Arthritis Mother   . Heart disease Mother   . Heart disease Father   . Colon cancer Father   . Parkinson's disease Father   . Pulmonary embolism Daughter   . Heart disease Sister   . Thyroid disease Sister     Social History   Socioeconomic History  . Marital status: Married    Spouse name: None  . Number of children: None  . Years of education: None  . Highest education level: None  Social Needs  . Financial resource strain: None  . Food insecurity - worry: None  . Food insecurity - inability: None  . Transportation needs - medical: None  . Transportation needs - non-medical: None  Occupational History  . None  Tobacco Use  . Smoking status: Never Smoker  . Smokeless tobacco: Never Used  Substance and Sexual Activity  . Alcohol use: Yes    Alcohol/week: 0.0 oz    Comment: once a month  . Drug use: No  . Sexual activity: Yes    Birth control/protection: None  Other Topics Concern  . None  Social History Narrative    [employer]. 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 albuterol (PROAIR HFA) 108 (90 Base) MCG/ACT inhaler Inhale 2 puffs into the lungs every 6 (six) hours as needed for wheezing. 3 Inhaler 0  . aspirin EC 81 MG tablet Take 1 tablet (81 mg total) by mouth daily.    Marland Kitchen. atorvastatin (LIPITOR) 10 MG tablet Take 10 mg by mouth daily.    . chlorpheniramine (CHLOR-TRIMETON) 4 MG tablet Take 4 mg by mouth daily as needed for allergies.    Marland Kitchen. dextromethorphan-guaiFENesin (MUCINEX DM) 30-600 MG 12hr tablet Take 1 tablet by mouth 2 (two) times daily as needed for cough.    . doxylamine, Sleep, (UNISOM) 25 MG tablet Take 25 mg by mouth at bedtime as needed.    .Marland Kitchen  escitalopram (LEXAPRO) 20 MG tablet Take 20 mg by mouth daily.     . famotidine (PEPCID) 20 MG tablet TAKE 1 TABLET AT BEDTIME 90 tablet 3  . fenofibrate 160 MG tablet TAKE 1 TABLET DAILY (KEEP UPCOMING APPOINTMENT FOR FUTURE REFILLS) 90 tablet 3  . gabapentin (NEURONTIN) 100 MG capsule Take 1 capsule (100 mg total) by mouth daily. 90 capsule 0  . ibuprofen (ADVIL,MOTRIN) 200 MG tablet Take 400 mg by mouth every 6 (six) hours as needed for headache or mild pain.     Marland Kitchen levothyroxine (SYNTHROID, LEVOTHROID) 75 MCG tablet Take 1 tablet (75 mcg total) by mouth daily. 90 tablet 3  . lithium carbonate (LITHOBID) 300 MG CR tablet daily. Pt is taking 1 tab in the AM    . lithium carbonate 150 MG capsule Take 150 mg by mouth every morning.  2  . Misc. Devices (ACAPELLA) MISC Use as directed 1 each 0  . Multiple Vitamins-Minerals (MULTIVITAMIN WITH MINERALS) tablet Take 1 tablet by mouth daily.    . pantoprazole (PROTONIX) 40 MG tablet TAKE 1 TABLET DAILY 90 tablet 0  . phenylephrine (SUDAFED PE) 10 MG TABS tablet Take 10 mg by mouth every 4 (four) hours as needed.    Lisa Lee 10  MCG TABS vaginal tablet Place 1 tablet vaginally every 14 (fourteen) days.      No facility-administered medications prior to visit.      EXAM:  BP 120/72 (BP Location: Left Arm, Patient Position: Sitting, Cuff Size: Normal)   Pulse 74   Temp 98.1 F (36.7 C) (Oral)   Wt 140 lb (63.5 kg)   LMP 12/09/2013   SpO2 98%   BMI 26.45 kg/m   Body mass index is 26.45 kg/m.  GENERAL: vitals reviewed and listed above, alert, oriented, appears well hydrated and in no acute distress HEENT: atraumatic, conjunctiva  clear, no obvious abnormalities on inspection of external nose and ears tm cleared OP : no lesion edema or exudate  Very red  Palat and OP no lesion NECK: no obvious masses on inspection palpation  LUNGS: clear to auscultation bilaterally, no wheezes, rales or rhonchi, good air movement CV: HRRR, no clubbing cyanosis or  peripheral edema nl cap refill  MS: moves all extremities without noticeable focal  abnormality PSYCH: pleasant and cooperative, no obvious depression or anxiety rs neg poct flu negative  ASSESSMENT AND PLAN:  Discussed the following assessment and plan:  Acute pharyngitis, unspecified etiology - Plan: POCT rapid strep A, POC Influenza A/B, Culture, Group A Strep  Cough - Plan: POCT rapid strep A, POC Influenza A/B, Culture, Group A Strep prob viral resp sx  Support .   -Patient advised to return or notify health care team  if symptoms worsen ,persist or new concerns arise.  Patient Instructions  Probably  Viral  .    Infection.    Throat culture  Pending.   symptomatic   Relief  At this time.      Lisa Lee. Lisa Lee M.D.

## 2017-10-14 NOTE — Patient Instructions (Signed)
Probably  Viral  .    Infection.    Throat culture  Pending.   symptomatic   Relief  At this time.

## 2017-10-15 ENCOUNTER — Ambulatory Visit: Payer: BLUE CROSS/BLUE SHIELD | Admitting: Internal Medicine

## 2017-10-16 LAB — CULTURE, GROUP A STREP
MICRO NUMBER: 90338600
SPECIMEN QUALITY:: ADEQUATE

## 2017-10-21 ENCOUNTER — Encounter: Payer: Self-pay | Admitting: Internal Medicine

## 2017-10-21 NOTE — Telephone Encounter (Signed)
Please advise Dr Panosh, thanks.   

## 2017-10-21 NOTE — Telephone Encounter (Signed)
Ulcers in the throat usually caused by viral  Respiratory infection .  But really hurt ! If no  Fever and using mask  Gloves  and  Feeling some better    Ok to work

## 2017-10-29 ENCOUNTER — Other Ambulatory Visit: Payer: Self-pay | Admitting: Cardiology

## 2017-10-29 DIAGNOSIS — E785 Hyperlipidemia, unspecified: Secondary | ICD-10-CM

## 2017-10-29 MED ORDER — FENOFIBRATE 160 MG PO TABS: ORAL_TABLET | ORAL | 1 refills | Status: DC

## 2017-11-01 ENCOUNTER — Other Ambulatory Visit: Payer: Self-pay | Admitting: Internal Medicine

## 2017-11-01 NOTE — Progress Notes (Signed)
Chief Complaint  Patient presents with  . Annual Exam    no new concerns  . Medication Management  . Hypothyroidism    HPI: Patient  Lisa Lee  57 y.o. comes in today for Preventive Health Care visit  And Chronic disease management  Thyroid  Due for checking  lipids sees cards  Missed one week of ?  Fenofibrate?   But ok on meds  Had labs done feb  At  Psych  Calcium 10  .  5  utd on gyne   Has osteopenia byu dexa      Health Maintenance  Topic Date Due  . HIV Screening  10/30/1974  . INFLUENZA VACCINE  02/27/2018  . MAMMOGRAM  05/30/2018  . PAP SMEAR  12/05/2019  . TETANUS/TDAP  02/21/2021  . COLONOSCOPY  02/13/2025  . Hepatitis C Screening  Completed   Health Maintenance Review LIFESTYLE:  Exercise:  No formal  But osteoporosis and doing weight bearing .  Tobacco/ETS: no Alcohol:  Very little  Sugar beverages: none  Sleep: 7 hours  Drug use: no HH of   2  Pets 1 dog  Work:  50 hours    husband lost jobs     ROS:  GEN/ HEENT: No fever, significant weight changes sweats headaches vision problems hearing changes, CV/ PULM; No chest pain shortness of breath cough, syncope,edema  change in exercise tolerance. GI /GU: No adominal pain, vomiting, change in bowel habits. No blood in the stool. No significant GU symptoms. SKIN/HEME: ,no acute skin rashes suspicious lesions or bleeding. No lymphadenopathy, nodules, masses.  NEURO/ PSYCH:  No neurologic signs such as weakness numbness. No depression anxiety. IMM/ Allergy: No unusual infections.  Allergy .   REST of 12 system review negative except as per HPI   Past Medical History:  Diagnosis Date  . Acid reflux   . Allergic rhinitis   . Asthma   . Depression   . Diarrhea 08/09/2011   Poss ibs  Vs other  Related to stress  consdier other eval if needed. Had colonoscoopy per dr Cristina Gong   . Hemorrhoids   . High cholesterol   . 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 Hypothyroidism   . Medication side effect 11/01/2011     tussionex   distrubed sleep     Past Surgical History:  Procedure Laterality Date  . CERVICAL CONIZATION W/BX N/A 07/21/2015   Procedure: CONIZATION CERVIX WITH BIOPSY;  Surgeon: Ena Dawley, MD;  Location: [facility];  Service: Gynecology;  Laterality: N/A;  . CERVICAL CRYOTHERAPY  1984  . COLPOSCOPY  08/26/13  . DILATION AND CURETTAGE, DIAGNOSTIC / THERAPEUTIC  1994   Blighted Ovum  . HYSTEROSCOPY     uterine polypectomy Jan 7th  . HYSTEROSCOPY WITH RESECTOSCOPE  08/04/2009   Removed polyp & IUD  . LYMPH NODE BIOPSY    . TONSILLECTOMY      Family History  Problem Relation Age of Onset  . Heart disease Sister 63       cabg stent  . Arthritis Mother   . Heart disease Mother   . Heart disease Father   . Colon cancer Father   . Parkinson's disease Father   . Pulmonary embolism Daughter   . Heart disease Sister   . Thyroid disease Sister     Social History   Socioeconomic History  . Marital status: Married    Spouse name: Not on file  . Number of children: Not on file  .  Years of education: Not on file  . Highest education level: Not on file  Occupational History  . Not on file  Social Needs  . Financial resource strain: Not on file  . Food insecurity:    Worry: Not on file    Inability: Not on file  . Transportation needs:    Medical: Not on file    Non-medical: Not on file  Tobacco Use  . Smoking status: Never Smoker  . Smokeless tobacco: Never Used  Substance and Sexual Activity  . Alcohol use: Yes    Alcohol/week: 0.0 oz    Comment: once a month  . Drug use: No  . Sexual activity: Yes    Birth control/protection: None  Lifestyle  . Physical activity:    Days per week: Not on file    Minutes per session: Not on file  . Stress: Not on file  Relationships  . Social connections:    Talks on phone: Not on file    Gets together: Not on file    Attends religious service: Not on file    Active member of club or organization: Not on file    Attends  meetings of clubs or organizations: Not on file    Relationship status: Not on file  Other Topics Concern  . Not on file  Social History Narrative    [employer]. 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 albuterol (PROAIR HFA) 108 (90 Base) MCG/ACT inhaler Inhale 2 puffs into the lungs every 6 (six) hours as needed for wheezing. 3 Inhaler 0  . aspirin EC 81 MG tablet Take 1 tablet (81 mg total) by mouth daily.    Marland Kitchen atorvastatin (LIPITOR) 10 MG tablet Take 10 mg by mouth daily.    . chlorpheniramine (CHLOR-TRIMETON) 4 MG tablet Take 4 mg by mouth daily as needed for allergies.    . Cholecalciferol (VITAMIN D) 2000 units tablet Take 2,000 Units by mouth daily.    Marland Kitchen dextromethorphan-guaiFENesin (MUCINEX DM) 30-600 MG 12hr tablet Take 1 tablet by mouth 2 (two) times daily as needed for cough.    . doxylamine, Sleep, (UNISOM) 25 MG tablet Take 25 mg by mouth at bedtime as needed.    Marland Kitchen escitalopram (LEXAPRO) 20 MG tablet Take 20 mg by mouth daily.     . famotidine (PEPCID) 20 MG tablet TAKE 1 TABLET AT BEDTIME 90 tablet 3  . fenofibrate 160 MG tablet TAKE 1 TABLET DAILY (KEEP UPCOMING APPOINTMENT FOR FUTURE REFILLS) 90 tablet 1  . gabapentin (NEURONTIN) 100 MG capsule Take 1 capsule (100 mg total) by mouth daily. 90 capsule 0  . ibuprofen (ADVIL,MOTRIN) 200 MG tablet Take 400 mg by mouth every 6 (six) hours as needed for headache or mild pain.     Marland Kitchen levothyroxine (SYNTHROID, LEVOTHROID) 75 MCG tablet Take 1 tablet (75 mcg total) by mouth daily. 90 tablet 3  . lithium carbonate (LITHOBID) 300 MG CR tablet daily. Pt is taking 1 tab in the AM    . Misc. Devices (ACAPELLA) MISC Use as directed 1 each 0  . modafinil (PROVIGIL) 100 MG tablet Take 25 mg by mouth daily.      Marland Kitchen  Multiple Vitamins-Minerals (MULTIVITAMIN WITH MINERALS) tablet Take 1 tablet by mouth daily.    . pantoprazole (PROTONIX) 40 MG tablet TAKE 1 TABLET BY MOUTH EVERY DAY 90 tablet 0  . phenylephrine (SUDAFED PE) 10 MG TABS tablet Take 10 mg by mouth every 4 (four) hours as needed.    Merril Abbe 10 MCG TABS vaginal tablet Place 1 tablet vaginally every 14 (fourteen) days.     Marland Kitchen lithium carbonate 150 MG capsule Take 150 mg by mouth every morning.  2   No facility-administered medications prior to visit.      EXAM:  BP 118/62 (BP Location: Left Arm, Patient Position: Sitting, Cuff Size: Normal)   Pulse 69   Temp 98.8 F (37.1 C) (Oral)   Ht 5' 1.5" (1.562 m)   Wt 137 lb 9.6 oz (62.4 kg)   LMP 12/09/2013   BMI 25.58 kg/m   Body mass index is 25.58 kg/m. Wt Readings from Last 3 Encounters:  11/05/17 137 lb 9.6 oz (62.4 kg)  10/14/17 140 lb (63.5 kg)  07/16/17 140 lb 6.4 oz (63.7 kg)    Physical Exam: Vital signs reviewed HCW:CBJS is a well-developed well-nourished alert cooperative    who appearsr stated age in no acute distress.  HEENT: normocephalic atraumatic , Eyes: PERRL EOM's full, conjunctiva clear, Nares: paten,t no deformity discharge or tenderness., Ears: no deformity EAC's clear TMs with normal landmarks. Mouth: clear OP, no lesions, edema.  Moist mucous membranes. Dentition in adequate repair. NECK: supple without masses, thyromegaly or bruits. CHEST/PULM:  Clear to auscultation and percussion breath sounds equal no wheeze , rales or rhonchi. No chest wall deformities or tenderness. Breast: normal by inspection . No dimpling, discharge, masses, tenderness or discharge . CV: PMI is nondisplaced, S1 S2 no gallops, murmurs, rubs. Peripheral pulses are full without delay.No JVD .  ABDOMEN: Bowel sounds normal nontender  No guard or rebound, no hepato splenomegal no CVA tenderness.  . Extremtities:  No clubbing cyanosis or edema, no acute joint swelling or redness no focal  atrophy NEURO:  Oriented x3, cranial nerves 3-12 appear to be intact, no obvious focal weakness,gait within normal limits no abnormal reflexes or asymmetrical SKIN: No acute rashes normal turgor, color, no bruising or petechiae. PSYCH: Oriented, good eye contact, no obvious depression anxiety, cognition and judgment appear normal. LN: no cervical axillary inguinal adenopathy  Lab Results  Component Value Date   WBC 10.6 (H) 10/30/2016   HGB 13.9 10/30/2016   HCT 42.1 10/30/2016   PLT 332.0 10/30/2016   GLUCOSE 82 01/07/2017   CHOL 165 01/07/2017   TRIG 100 01/07/2017   HDL 61 01/07/2017   LDLDIRECT 101.0 10/30/2016   LDLCALC 84 01/07/2017   ALT 31 01/07/2017   AST 25 01/07/2017   NA 137 01/07/2017   K 5.1 01/07/2017   CL 102 01/07/2017   CREATININE 1.02 (H) 01/07/2017   BUN 22 01/07/2017   CO2 22 01/07/2017   TSH 2.14 10/30/2016    BP Readings from Last 3 Encounters:  11/05/17 118/62  10/14/17 120/72  07/16/17 134/72    Wt Readings from Last 3 Encounters:  11/05/17 137 lb 9.6 oz (62.4 kg)  10/14/17 140 lb (63.5 kg)  07/16/17 140 lb 6.4 oz (63.7 kg)     ASSESSMENT AND PLAN:  Discussed the following assessment and plan:  Visit for preventive health examination - Plan: Lipid panel, TSH, CBC with Differential/Platelet, CMP, Hemoglobin A1c  Medication management - Plan: Lipid panel, TSH, CBC with Differential/Platelet,  CMP, Hemoglobin A1c  Hypothyroidism, unspecified type - Plan: Lipid panel, TSH, CBC with Differential/Platelet  FH: premature coronary heart disease - Plan: Lipid panel, TSH, CBC with Differential/Platelet  Hyperlipidemia, unspecified hyperlipidemia type - Plan: Lipid panel, TSH, CBC with Differential/Platelet, CMP, Hemoglobin A1c  Fasting hyperglycemia - Plan: Lipid panel, TSH, CBC with Differential/Platelet, CMP, Hemoglobin A1c  Osteopenia, unspecified location - dexa -1 and -1.5 hip  per gyne  2019 Osteopenia .    -1.0 and -1.5    Now on 2000 iu  vit d   Checking  a1c for fbs of 104    Patient Care Team: Jala Dundon, Standley Brooking, MD as PCP - Silvano Rusk, MD (Ophthalmology) Ena Dawley, MD (Obstetrics and Gynecology) Ronald Lobo, MD as Consulting Physician (Gastroenterology) Tanda Rockers, MD as Consulting Physician (Pulmonary Disease) Aldean Baker, MD as Consulting Physician (Cardiology) Patient Instructions  Will notify you  of labs when available.   Continue lifestyle intervention healthy eating and exercise . Weight bearing exercise   Health Maintenance, Female Adopting a healthy lifestyle and getting preventive care can go a long way to promote health and wellness. Talk with your health care provider about what schedule of regular examinations is right for you. This is a good chance for you to check in with your provider about disease prevention and staying healthy. In between checkups, there are plenty of things you can do on your own. Experts have done a lot of research about which lifestyle changes and preventive measures are most likely to keep you healthy. Ask your health care provider for more information. Weight and diet Eat a healthy diet  Be sure to include plenty of vegetables, fruits, low-fat dairy products, and lean protein.  Do not eat a lot of foods high in solid fats, added sugars, or salt.  Get regular exercise. This is one of the most important things you can do for your health. ? Most adults should exercise for at least 150 minutes each week. The exercise should increase your heart rate and make you sweat (moderate-intensity exercise). ? Most adults should also do strengthening exercises at least twice a week. This is in addition to the moderate-intensity exercise.  Maintain a healthy weight  Body mass index (BMI) is a measurement that can be used to identify possible weight problems. It estimates body fat based on height and weight. Your health care provider can help  determine your BMI and help you achieve or maintain a healthy weight.  For females 64 years of age and older: ? A BMI below 18.5 is considered underweight. ? A BMI of 18.5 to 24.9 is normal. ? A BMI of 25 to 29.9 is considered overweight. ? A BMI of 30 and above is considered obese.  Watch levels of cholesterol and blood lipids  You should start having your blood tested for lipids and cholesterol at 58 years of age, then have this test every 5 years.  You may need to have your cholesterol levels checked more often if: ? Your lipid or cholesterol levels are high. ? You are older than 58 years of age. ? You are at high risk for heart disease.  Cancer screening Lung Cancer  Lung cancer screening is recommended for adults 59-3 years old who are at high risk for lung cancer because of a history of smoking.  A yearly low-dose CT scan of the lungs is recommended for people who: ? Currently smoke. ? Have quit within the past 15 years. ?  Have at least a 30-pack-year history of smoking. A pack year is smoking an average of one pack of cigarettes a day for 1 year.  Yearly screening should continue until it has been 15 years since you quit.  Yearly screening should stop if you develop a health problem that would prevent you from having lung cancer treatment.  Breast Cancer  Practice breast self-awareness. This means understanding how your breasts normally appear and feel.  It also means doing regular breast self-exams. Let your health care provider know about any changes, no matter how small.  If you are in your 20s or 30s, you should have a clinical breast exam (CBE) by a health care provider every 1-3 years as part of a regular health exam.  If you are 57 or older, have a CBE every year. Also consider having a breast X-ray (mammogram) every year.  If you have a family history of breast cancer, talk to your health care provider about genetic screening.  If you are at high risk for  breast cancer, talk to your health care provider about having an MRI and a mammogram every year.  Breast cancer gene (BRCA) assessment is recommended for women who have family members with BRCA-related cancers. BRCA-related cancers include: ? Breast. ? Ovarian. ? Tubal. ? Peritoneal cancers.  Results of the assessment will determine the need for genetic counseling and BRCA1 and BRCA2 testing.  Cervical Cancer Your health care provider may recommend that you be screened regularly for cancer of the pelvic organs (ovaries, uterus, and vagina). This screening involves a pelvic examination, including checking for microscopic changes to the surface of your cervix (Pap test). You may be encouraged to have this screening done every 3 years, beginning at age 25.  For women ages 61-65, health care providers may recommend pelvic exams and Pap testing every 3 years, or they may recommend the Pap and pelvic exam, combined with testing for human papilloma virus (HPV), every 5 years. Some types of HPV increase your risk of cervical cancer. Testing for HPV may also be done on women of any age with unclear Pap test results.  Other health care providers may not recommend any screening for nonpregnant women who are considered low risk for pelvic cancer and who do not have symptoms. Ask your health care provider if a screening pelvic exam is right for you.  If you have had past treatment for cervical cancer or a condition that could lead to cancer, you need Pap tests and screening for cancer for at least 20 years after your treatment. If Pap tests have been discontinued, your risk factors (such as having a new sexual partner) need to be reassessed to determine if screening should resume. Some women have medical problems that increase the chance of getting cervical cancer. In these cases, your health care provider may recommend more frequent screening and Pap tests.  Colorectal Cancer  This type of cancer can be  detected and often prevented.  Routine colorectal cancer screening usually begins at 58 years of age and continues through 58 years of age.  Your health care provider may recommend screening at an earlier age if you have risk factors for colon cancer.  Your health care provider may also recommend using home test kits to check for hidden blood in the stool.  A small camera at the end of a tube can be used to examine your colon directly (sigmoidoscopy or colonoscopy). This is done to check for the earliest forms of colorectal  cancer.  Routine screening usually begins at age 10.  Direct examination of the colon should be repeated every 5-10 years through 58 years of age. However, you may need to be screened more often if early forms of precancerous polyps or small growths are found.  Skin Cancer  Check your skin from head to toe regularly.  Tell your health care provider about any new moles or changes in moles, especially if there is a change in a mole's shape or color.  Also tell your health care provider if you have a mole that is larger than the size of a pencil eraser.  Always use sunscreen. Apply sunscreen liberally and repeatedly throughout the day.  Protect yourself by wearing long sleeves, pants, a wide-brimmed hat, and sunglasses whenever you are outside.  Heart disease, diabetes, and high blood pressure  High blood pressure causes heart disease and increases the risk of stroke. High blood pressure is more likely to develop in: ? People who have blood pressure in the high end of the normal range (130-139/85-89 mm Hg). ? People who are overweight or obese. ? People who are African American.  If you are 30-81 years of age, have your blood pressure checked every 3-5 years. If you are 59 years of age or older, have your blood pressure checked every year. You should have your blood pressure measured twice-once when you are at a hospital or clinic, and once when you are not at a  hospital or clinic. Record the average of the two measurements. To check your blood pressure when you are not at a hospital or clinic, you can use: ? An automated blood pressure machine at a pharmacy. ? A home blood pressure monitor.  If you are between 100 years and 47 years old, ask your health care provider if you should take aspirin to prevent strokes.  Have regular diabetes screenings. This involves taking a blood sample to check your fasting blood sugar level. ? If you are at a normal weight and have a low risk for diabetes, have this test once every three years after 58 years of age. ? If you are overweight and have a high risk for diabetes, consider being tested at a younger age or more often. Preventing infection Hepatitis B  If you have a higher risk for hepatitis B, you should be screened for this virus. You are considered at high risk for hepatitis B if: ? You were born in a country where hepatitis B is common. Ask your health care provider which countries are considered high risk. ? Your parents were born in a high-risk country, and you have not been immunized against hepatitis B (hepatitis B vaccine). ? You have HIV or AIDS. ? You use needles to inject street drugs. ? You live with someone who has hepatitis B. ? You have had sex with someone who has hepatitis B. ? You get hemodialysis treatment. ? You take certain medicines for conditions, including cancer, organ transplantation, and autoimmune conditions.  Hepatitis C  Blood testing is recommended for: ? Everyone born from [birth year] through 1966. ? Anyone with known risk factors for hepatitis C.  Sexually transmitted infections (STIs)  You should be screened for sexually transmitted infections (STIs) including gonorrhea and chlamydia if: ? You are sexually active and are younger than 58 years of age. ? You are older than 58 years of age and your health care provider tells you that you are at risk for this type of  infection. ? Your  sexual activity has changed since you were last screened and you are at an increased risk for chlamydia or gonorrhea. Ask your health care provider if you are at risk.  If you do not have HIV, but are at risk, it may be recommended that you take a prescription medicine daily to prevent HIV infection. This is called pre-exposure prophylaxis (PrEP). You are considered at risk if: ? You are sexually active and do not regularly use condoms or know the HIV status of your partner(s). ? You take drugs by injection. ? You are sexually active with a partner who has HIV.  Talk with your health care provider about whether you are at high risk of being infected with HIV. If you choose to begin PrEP, you should first be tested for HIV. You should then be tested every 3 months for as long as you are taking PrEP. Pregnancy  If you are premenopausal and you may become pregnant, ask your health care provider about preconception counseling.  If you may become pregnant, take 400 to 800 micrograms (mcg) of folic acid every day.  If you want to prevent pregnancy, talk to your health care provider about birth control (contraception). Osteoporosis and menopause  Osteoporosis is a disease in which the bones lose minerals and strength with aging. This can result in serious bone fractures. Your risk for osteoporosis can be identified using a bone density scan.  If you are 47 years of age or older, or if you are at risk for osteoporosis and fractures, ask your health care provider if you should be screened.  Ask your health care provider whether you should take a calcium or vitamin D supplement to lower your risk for osteoporosis.  Menopause may have certain physical symptoms and risks.  Hormone replacement therapy may reduce some of these symptoms and risks. Talk to your health care provider about whether hormone replacement therapy is right for you. Follow these instructions at home:  Schedule  regular health, dental, and eye exams.  Stay current with your immunizations.  Do not use any tobacco products including cigarettes, chewing tobacco, or electronic cigarettes.  If you are pregnant, do not drink alcohol.  If you are breastfeeding, limit how much and how often you drink alcohol.  Limit alcohol intake to no more than 1 drink per day for nonpregnant women. One drink equals 12 ounces of beer, 5 ounces of wine, or 1 ounces of hard liquor.  Do not use street drugs.  Do not share needles.  Ask your health care provider for help if you need support or information about quitting drugs.  Tell your health care provider if you often feel depressed.  Tell your health care provider if you have ever been abused or do not feel safe at home. This information is not intended to replace advice given to you by your health care provider. Make sure you discuss any questions you have with your health care provider. Document Released: 01/29/2011 Document Revised: 12/22/2015 Document Reviewed: 04/19/2015 Elsevier Interactive Patient Education  2018 [place] protect organs, store calcium, and anchor muscles. Good health habits, such as eating nutritious foods and exercising regularly, are important for maintaining healthy bones. They can also help to prevent a condition that causes bones to lose density and become weak and brittle (osteoporosis). Why is bone mass important? Bone mass refers to the amount of bone tissue that you have. The higher your bone mass, the stronger your bones.  An important step toward having healthy bones throughout life is to have strong and dense bones during childhood. A young adult who has a high bone mass is more likely to have a high bone mass later in life. Bone mass at its greatest it is called peak bone mass. A large decline in bone mass occurs in older adults. In women, it occurs about the time of menopause. During this time, it is  important to practice good health habits, because if more bone is lost than what is replaced, the bones will become less healthy and more likely to break (fracture). If you find that you have a low bone mass, you may be able to prevent osteoporosis or further bone loss by changing your diet and lifestyle. How can I find out if my bone mass is low? Bone mass can be measured with an X-ray test that is called a bone mineral density (BMD) test. This test is recommended for all women who are age 52 or older. It may also be recommended for men who are age 37 or older, or for people who are more likely to develop osteoporosis due to:  Having bones that break easily.  Having a long-term disease that weakens bones, such as kidney disease or rheumatoid arthritis.  Having menopause earlier than normal.  Taking medicine that weakens bones, such as steroids, thyroid hormones, or hormone treatment for breast cancer or prostate cancer.  Smoking.  Drinking three or more alcoholic drinks each day.  What are the nutritional recommendations for healthy bones? To have healthy bones, you need to get enough of the right minerals and vitamins. Most nutrition experts recommend getting these nutrients from the foods that you eat. Nutritional recommendations vary from person to person. Ask your health care provider what is healthy for you. Here are some general guidelines. Calcium Recommendations Calcium is the most important (essential) mineral for bone health. Most people can get enough calcium from their diet, but supplements may be recommended for people who are at risk for osteoporosis. Good sources of calcium include:  Dairy products, such as low-fat or nonfat milk, cheese, and yogurt.  Dark green leafy vegetables, such as bok choy and broccoli.  Calcium-fortified foods, such as orange juice, cereal, bread, soy beverages, and tofu products.  Nuts, such as almonds.  Follow these recommended amounts for daily  calcium intake:  Children, age 35?3: 700 mg.  Children, age 26?8: 1,000 mg.  Children, age 25?13: 1,300 mg.  Teens, age 36?18: 1,300 mg.  Adults, age 28?50: 1,000 mg.  Adults, age 84?70: ? Men: 1,000 mg. ? Women: 1,200 mg.  Adults, age 359 or older: 1,200 mg.  Pregnant and breastfeeding females: ? Teens: 1,300 mg. ? Adults: 1,000 mg.  Vitamin D Recommendations Vitamin D is the most essential vitamin for bone health. It helps the body to absorb calcium. Sunlight stimulates the skin to make vitamin D, so be sure to get enough sunlight. If you live in a cold climate or you do not get outside often, your health care provider may recommend that you take vitamin D supplements. Good sources of vitamin D in your diet include:  Egg yolks.  Saltwater fish.  Milk and cereal fortified with vitamin D.  Follow these recommended amounts for daily vitamin D intake:  Children and teens, age 80?18: 68 international units.  Adults, age 95 or younger: 400-800 international units.  Adults, age 269 or older: 800-1,000 international units.  Other Nutrients Other nutrients for bone health include:  Phosphorus. This mineral is found in meat, poultry, dairy foods, nuts, and legumes. The recommended daily intake for adult men and adult women is 700 mg.  Magnesium. This mineral is found in seeds, nuts, dark green vegetables, and legumes. The recommended daily intake for adult men is 400?420 mg. For adult women, it is 310?320 mg.  Vitamin K. This vitamin is found in green leafy vegetables. The recommended daily intake is 120 mg for adult men and 90 mg for adult women.  What type of physical activity is best for building and maintaining healthy bones? Weight-bearing and strength-building activities are important for building and maintaining peak bone mass. Weight-bearing activities cause muscles and bones to work against gravity. Strength-building activities increases muscle strength that supports  bones. Weight-bearing and muscle-building activities include:  Walking and hiking.  Jogging and running.  Dancing.  Gym exercises.  Lifting weights.  Tennis and racquetball.  Climbing stairs.  Aerobics.  Adults should get at least 30 minutes of moderate physical activity on most days. Children should get at least 60 minutes of moderate physical activity on most days. Ask your health care provide what type of exercise is best for you. Where can I find more information? For more information, check out the following websites:  [place]: [employer]  [employer]: http://www.niams.AnonymousEar.fr.asp  This information is not intended to replace advice given to you by your health care provider. Make sure you discuss any questions you have with your health care provider. Document Released: 10/06/2003 Document Revised: 02/03/2016 Document Reviewed: 07/21/2014 Elsevier Interactive Patient Education  2018 [place]. Jariana Shumard M.D.

## 2017-11-05 ENCOUNTER — Encounter: Payer: Self-pay | Admitting: Internal Medicine

## 2017-11-05 ENCOUNTER — Ambulatory Visit (INDEPENDENT_AMBULATORY_CARE_PROVIDER_SITE_OTHER): Payer: BLUE CROSS/BLUE SHIELD | Admitting: Internal Medicine

## 2017-11-05 VITALS — BP 118/62 | HR 69 | Temp 98.8°F | Ht 61.5 in | Wt 137.6 lb

## 2017-11-05 VITALS — BP 130/82 | HR 71 | Ht 61.5 in | Wt 137.0 lb

## 2017-11-05 DIAGNOSIS — Z8249 Family history of ischemic heart disease and other diseases of the circulatory system: Secondary | ICD-10-CM | POA: Diagnosis not present

## 2017-11-05 DIAGNOSIS — E785 Hyperlipidemia, unspecified: Secondary | ICD-10-CM

## 2017-11-05 DIAGNOSIS — Z79899 Other long term (current) drug therapy: Secondary | ICD-10-CM | POA: Diagnosis not present

## 2017-11-05 DIAGNOSIS — R05 Cough: Secondary | ICD-10-CM | POA: Diagnosis not present

## 2017-11-05 DIAGNOSIS — R7301 Impaired fasting glucose: Secondary | ICD-10-CM | POA: Diagnosis not present

## 2017-11-05 DIAGNOSIS — E039 Hypothyroidism, unspecified: Secondary | ICD-10-CM

## 2017-11-05 DIAGNOSIS — J45991 Cough variant asthma: Secondary | ICD-10-CM

## 2017-11-05 DIAGNOSIS — R058 Other specified cough: Secondary | ICD-10-CM

## 2017-11-05 DIAGNOSIS — M8580 Other specified disorders of bone density and structure, unspecified site: Secondary | ICD-10-CM | POA: Diagnosis not present

## 2017-11-05 DIAGNOSIS — Z0000 Encounter for general adult medical examination without abnormal findings: Secondary | ICD-10-CM

## 2017-11-05 LAB — COMPREHENSIVE METABOLIC PANEL
ALT: 38 U/L — ABNORMAL HIGH (ref 0–35)
AST: 26 U/L (ref 0–37)
Albumin: 4.6 g/dL (ref 3.5–5.2)
Alkaline Phosphatase: 52 U/L (ref 39–117)
BUN: 22 mg/dL (ref 6–23)
CHLORIDE: 107 meq/L (ref 96–112)
CO2: 25 meq/L (ref 19–32)
Calcium: 10.3 mg/dL (ref 8.4–10.5)
Creatinine, Ser: 0.93 mg/dL (ref 0.40–1.20)
GFR: 65.81 mL/min (ref >60.00)
GLUCOSE: 94 mg/dL (ref 70–99)
POTASSIUM: 4.8 meq/L (ref 3.5–5.1)
SODIUM: 139 meq/L (ref 135–145)
Total Bilirubin: 0.5 mg/dL (ref 0.2–1.2)
Total Protein: 7.2 g/dL (ref 6.0–8.3)

## 2017-11-05 LAB — LIPID PANEL
CHOL/HDL RATIO: 3
CHOLESTEROL: 192 mg/dL (ref 0–200)
HDL: 56.7 mg/dL (ref >39.00)
LDL CALC: 111 mg/dL — AB (ref 0–99)
NonHDL: 135.03
Triglycerides: 119 mg/dL (ref 0.0–149.0)
VLDL: 23.8 mg/dL (ref 0.0–40.0)

## 2017-11-05 LAB — CBC WITH DIFFERENTIAL/PLATELET
BASOS PCT: 0.8 % (ref 0.0–3.0)
Basophils Absolute: 0.1 10*3/uL (ref 0.0–0.1)
EOS ABS: 0.5 10*3/uL (ref 0.0–0.7)
EOS PCT: 5.9 % — AB (ref 0.0–5.0)
HCT: 39.5 % (ref 36.0–46.0)
Hemoglobin: 13.4 g/dL (ref 12.0–15.0)
LYMPHS ABS: 1.9 10*3/uL (ref 0.7–4.0)
Lymphocytes Relative: 22.3 % (ref 12.0–46.0)
MCHC: 33.9 g/dL (ref 30.0–36.0)
MCV: 89.8 fl (ref 78.0–100.0)
MONO ABS: 0.6 10*3/uL (ref 0.1–1.0)
Monocytes Relative: 7.4 % (ref 3.0–12.0)
NEUTROS PCT: 63.6 % (ref 43.0–77.0)
Neutro Abs: 5.5 10*3/uL (ref 1.4–7.7)
PLATELETS: 380 10*3/uL (ref 150.0–400.0)
RBC: 4.4 Mil/uL (ref 3.87–5.11)
RDW: 12.8 % (ref 11.5–15.5)
WBC: 8.6 10*3/uL (ref 4.0–10.5)

## 2017-11-05 LAB — TSH: TSH: 1.84 u[IU]/mL (ref 0.35–4.50)

## 2017-11-05 LAB — HEMOGLOBIN A1C: HEMOGLOBIN A1C: 5.5 % (ref 4.6–6.5)

## 2017-11-05 NOTE — Patient Instructions (Signed)
Will notify you  of labs when available.   Continue lifestyle intervention healthy eating and exercise . Weight bearing exercise   Health Maintenance, Female Adopting a healthy lifestyle and getting preventive care can go a long way to promote health and wellness. Talk with your health care provider about what schedule of regular examinations is right for you. This is a good chance for you to check in with your provider about disease prevention and staying healthy. In between checkups, there are plenty of things you can do on your own. Experts have done a lot of research about which lifestyle changes and preventive measures are most likely to keep you healthy. Ask your health care provider for more information. Weight and diet Eat a healthy diet  Be sure to include plenty of vegetables, fruits, low-fat dairy products, and lean protein.  Do not eat a lot of foods high in solid fats, added sugars, or salt.  Get regular exercise. This is one of the most important things you can do for your health. ? Most adults should exercise for at least 150 minutes each week. The exercise should increase your heart rate and make you sweat (moderate-intensity exercise). ? Most adults should also do strengthening exercises at least twice a week. This is in addition to the moderate-intensity exercise.  Maintain a healthy weight  Body mass index (BMI) is a measurement that can be used to identify possible weight problems. It estimates body fat based on height and weight. Your health care provider can help determine your BMI and help you achieve or maintain a healthy weight.  For females 82 years of age and older: ? A BMI below 18.5 is considered underweight. ? A BMI of 18.5 to 24.9 is normal. ? A BMI of 25 to 29.9 is considered overweight. ? A BMI of 30 and above is considered obese.  Watch levels of cholesterol and blood lipids  You should start having your blood tested for lipids and cholesterol at 58  years of age, then have this test every 5 years.  You may need to have your cholesterol levels checked more often if: ? Your lipid or cholesterol levels are high. ? You are older than 58 years of age. ? You are at high risk for heart disease.  Cancer screening Lung Cancer  Lung cancer screening is recommended for adults 33-8 years old who are at high risk for lung cancer because of a history of smoking.  A yearly low-dose CT scan of the lungs is recommended for people who: ? Currently smoke. ? Have quit within the past 15 years. ? Have at least a 30-pack-year history of smoking. A pack year is smoking an average of one pack of cigarettes a day for 1 year.  Yearly screening should continue until it has been 15 years since you quit.  Yearly screening should stop if you develop a health problem that would prevent you from having lung cancer treatment.  Breast Cancer  Practice breast self-awareness. This means understanding how your breasts normally appear and feel.  It also means doing regular breast self-exams. Let your health care provider know about any changes, no matter how small.  If you are in your 20s or 30s, you should have a clinical breast exam (CBE) by a health care provider every 1-3 years as part of a regular health exam.  If you are 52 or older, have a CBE every year. Also consider having a breast X-ray (mammogram) every year.  If you  have a family history of breast cancer, talk to your health care provider about genetic screening.  If you are at high risk for breast cancer, talk to your health care provider about having an MRI and a mammogram every year.  Breast cancer gene (BRCA) assessment is recommended for women who have family members with BRCA-related cancers. BRCA-related cancers include: ? Breast. ? Ovarian. ? Tubal. ? Peritoneal cancers.  Results of the assessment will determine the need for genetic counseling and BRCA1 and BRCA2 testing.  Cervical  Cancer Your health care provider may recommend that you be screened regularly for cancer of the pelvic organs (ovaries, uterus, and vagina). This screening involves a pelvic examination, including checking for microscopic changes to the surface of your cervix (Pap test). You may be encouraged to have this screening done every 3 years, beginning at age 45.  For women ages 13-65, health care providers may recommend pelvic exams and Pap testing every 3 years, or they may recommend the Pap and pelvic exam, combined with testing for human papilloma virus (HPV), every 5 years. Some types of HPV increase your risk of cervical cancer. Testing for HPV may also be done on women of any age with unclear Pap test results.  Other health care providers may not recommend any screening for nonpregnant women who are considered low risk for pelvic cancer and who do not have symptoms. Ask your health care provider if a screening pelvic exam is right for you.  If you have had past treatment for cervical cancer or a condition that could lead to cancer, you need Pap tests and screening for cancer for at least 20 years after your treatment. If Pap tests have been discontinued, your risk factors (such as having a new sexual partner) need to be reassessed to determine if screening should resume. Some women have medical problems that increase the chance of getting cervical cancer. In these cases, your health care provider may recommend more frequent screening and Pap tests.  Colorectal Cancer  This type of cancer can be detected and often prevented.  Routine colorectal cancer screening usually begins at 58 years of age and continues through 58 years of age.  Your health care provider may recommend screening at an earlier age if you have risk factors for colon cancer.  Your health care provider may also recommend using home test kits to check for hidden blood in the stool.  A small camera at the end of a tube can be used to  examine your colon directly (sigmoidoscopy or colonoscopy). This is done to check for the earliest forms of colorectal cancer.  Routine screening usually begins at age 42.  Direct examination of the colon should be repeated every 5-10 years through 58 years of age. However, you may need to be screened more often if early forms of precancerous polyps or small growths are found.  Skin Cancer  Check your skin from head to toe regularly.  Tell your health care provider about any new moles or changes in moles, especially if there is a change in a mole's shape or color.  Also tell your health care provider if you have a mole that is larger than the size of a pencil eraser.  Always use sunscreen. Apply sunscreen liberally and repeatedly throughout the day.  Protect yourself by wearing long sleeves, pants, a wide-brimmed hat, and sunglasses whenever you are outside.  Heart disease, diabetes, and high blood pressure  High blood pressure causes heart disease and increases the  risk of stroke. High blood pressure is more likely to develop in: ? People who have blood pressure in the high end of the normal range (130-139/85-89 mm Hg). ? People who are overweight or obese. ? People who are African American.  If you are 59-63 years of age, have your blood pressure checked every 3-5 years. If you are 32 years of age or older, have your blood pressure checked every year. You should have your blood pressure measured twice-once when you are at a hospital or clinic, and once when you are not at a hospital or clinic. Record the average of the two measurements. To check your blood pressure when you are not at a hospital or clinic, you can use: ? An automated blood pressure machine at a pharmacy. ? A home blood pressure monitor.  If you are between 37 years and 78 years old, ask your health care provider if you should take aspirin to prevent strokes.  Have regular diabetes screenings. This involves taking a  blood sample to check your fasting blood sugar level. ? If you are at a normal weight and have a low risk for diabetes, have this test once every three years after 58 years of age. ? If you are overweight and have a high risk for diabetes, consider being tested at a younger age or more often. Preventing infection Hepatitis B  If you have a higher risk for hepatitis B, you should be screened for this virus. You are considered at high risk for hepatitis B if: ? You were born in a country where hepatitis B is common. Ask your health care provider which countries are considered high risk. ? Your parents were born in a high-risk country, and you have not been immunized against hepatitis B (hepatitis B vaccine). ? You have HIV or AIDS. ? You use needles to inject street drugs. ? You live with someone who has hepatitis B. ? You have had sex with someone who has hepatitis B. ? You get hemodialysis treatment. ? You take certain medicines for conditions, including cancer, organ transplantation, and autoimmune conditions.  Hepatitis C  Blood testing is recommended for: ? Everyone born from [birth year] through 1966. ? Anyone with known risk factors for hepatitis C.  Sexually transmitted infections (STIs)  You should be screened for sexually transmitted infections (STIs) including gonorrhea and chlamydia if: ? You are sexually active and are younger than 58 years of age. ? You are older than 58 years of age and your health care provider tells you that you are at risk for this type of infection. ? Your sexual activity has changed since you were last screened and you are at an increased risk for chlamydia or gonorrhea. Ask your health care provider if you are at risk.  If you do not have HIV, but are at risk, it may be recommended that you take a prescription medicine daily to prevent HIV infection. This is called pre-exposure prophylaxis (PrEP). You are considered at risk if: ? You are sexually active and  do not regularly use condoms or know the HIV status of your partner(s). ? You take drugs by injection. ? You are sexually active with a partner who has HIV.  Talk with your health care provider about whether you are at high risk of being infected with HIV. If you choose to begin PrEP, you should first be tested for HIV. You should then be tested every 3 months for as long as you are taking PrEP. Pregnancy  If you are premenopausal and you may become pregnant, ask your health care provider about preconception counseling.  If you may become pregnant, take 400 to 800 micrograms (mcg) of folic acid every day.  If you want to prevent pregnancy, talk to your health care provider about birth control (contraception). Osteoporosis and menopause  Osteoporosis is a disease in which the bones lose minerals and strength with aging. This can result in serious bone fractures. Your risk for osteoporosis can be identified using a bone density scan.  If you are 98 years of age or older, or if you are at risk for osteoporosis and fractures, ask your health care provider if you should be screened.  Ask your health care provider whether you should take a calcium or vitamin D supplement to lower your risk for osteoporosis.  Menopause may have certain physical symptoms and risks.  Hormone replacement therapy may reduce some of these symptoms and risks. Talk to your health care provider about whether hormone replacement therapy is right for you. Follow these instructions at home:  Schedule regular health, dental, and eye exams.  Stay current with your immunizations.  Do not use any tobacco products including cigarettes, chewing tobacco, or electronic cigarettes.  If you are pregnant, do not drink alcohol.  If you are breastfeeding, limit how much and how often you drink alcohol.  Limit alcohol intake to no more than 1 drink per day for nonpregnant women. One drink equals 12 ounces of beer, 5 ounces of  wine, or 1 ounces of hard liquor.  Do not use street drugs.  Do not share needles.  Ask your health care provider for help if you need support or information about quitting drugs.  Tell your health care provider if you often feel depressed.  Tell your health care provider if you have ever been abused or do not feel safe at home. This information is not intended to replace advice given to you by your health care provider. Make sure you discuss any questions you have with your health care provider. Document Released: 01/29/2011 Document Revised: 12/22/2015 Document Reviewed: 04/19/2015 Elsevier Interactive Patient Education  2018 [place] protect organs, store calcium, and anchor muscles. Good health habits, such as eating nutritious foods and exercising regularly, are important for maintaining healthy bones. They can also help to prevent a condition that causes bones to lose density and become weak and brittle (osteoporosis). Why is bone mass important? Bone mass refers to the amount of bone tissue that you have. The higher your bone mass, the stronger your bones. An important step toward having healthy bones throughout life is to have strong and dense bones during childhood. A young adult who has a high bone mass is more likely to have a high bone mass later in life. Bone mass at its greatest it is called peak bone mass. A large decline in bone mass occurs in older adults. In women, it occurs about the time of menopause. During this time, it is important to practice good health habits, because if more bone is lost than what is replaced, the bones will become less healthy and more likely to break (fracture). If you find that you have a low bone mass, you may be able to prevent osteoporosis or further bone loss by changing your diet and lifestyle. How can I find out if my bone mass is low? Bone mass can be measured with an X-ray test that is called a bone  mineral  density (BMD) test. This test is recommended for all women who are age 68 or older. It may also be recommended for men who are age 35 or older, or for people who are more likely to develop osteoporosis due to:  Having bones that break easily.  Having a long-term disease that weakens bones, such as kidney disease or rheumatoid arthritis.  Having menopause earlier than normal.  Taking medicine that weakens bones, such as steroids, thyroid hormones, or hormone treatment for breast cancer or prostate cancer.  Smoking.  Drinking three or more alcoholic drinks each day.  What are the nutritional recommendations for healthy bones? To have healthy bones, you need to get enough of the right minerals and vitamins. Most nutrition experts recommend getting these nutrients from the foods that you eat. Nutritional recommendations vary from person to person. Ask your health care provider what is healthy for you. Here are some general guidelines. Calcium Recommendations Calcium is the most important (essential) mineral for bone health. Most people can get enough calcium from their diet, but supplements may be recommended for people who are at risk for osteoporosis. Good sources of calcium include:  Dairy products, such as low-fat or nonfat milk, cheese, and yogurt.  Dark green leafy vegetables, such as bok choy and broccoli.  Calcium-fortified foods, such as orange juice, cereal, bread, soy beverages, and tofu products.  Nuts, such as almonds.  Follow these recommended amounts for daily calcium intake:  Children, age 53?3: 700 mg.  Children, age 82?8: 1,000 mg.  Children, age 58?13: 1,300 mg.  Teens, age 28?18: 1,300 mg.  Adults, age 73?50: 1,000 mg.  Adults, age 3?70: ? Men: 1,000 mg. ? Women: 1,200 mg.  Adults, age 77 or older: 1,200 mg.  Pregnant and breastfeeding females: ? Teens: 1,300 mg. ? Adults: 1,000 mg.  Vitamin D Recommendations Vitamin D is the most essential vitamin for  bone health. It helps the body to absorb calcium. Sunlight stimulates the skin to make vitamin D, so be sure to get enough sunlight. If you live in a cold climate or you do not get outside often, your health care provider may recommend that you take vitamin D supplements. Good sources of vitamin D in your diet include:  Egg yolks.  Saltwater fish.  Milk and cereal fortified with vitamin D.  Follow these recommended amounts for daily vitamin D intake:  Children and teens, age 56?18: 43 international units.  Adults, age 22 or younger: 400-800 international units.  Adults, age 825 or older: 800-1,000 international units.  Other Nutrients Other nutrients for bone health include:  Phosphorus. This mineral is found in meat, poultry, dairy foods, nuts, and legumes. The recommended daily intake for adult men and adult women is 700 mg.  Magnesium. This mineral is found in seeds, nuts, dark green vegetables, and legumes. The recommended daily intake for adult men is 400?420 mg. For adult women, it is 310?320 mg.  Vitamin K. This vitamin is found in green leafy vegetables. The recommended daily intake is 120 mg for adult men and 90 mg for adult women.  What type of physical activity is best for building and maintaining healthy bones? Weight-bearing and strength-building activities are important for building and maintaining peak bone mass. Weight-bearing activities cause muscles and bones to work against gravity. Strength-building activities increases muscle strength that supports bones. Weight-bearing and muscle-building activities include:  Walking and hiking.  Jogging and running.  Dancing.  Gym exercises.  Lifting weights.  Tennis and racquetball.  Climbing stairs.  Aerobics.  Adults should get at least 30 minutes of moderate physical activity on most days. Children should get at least 60 minutes of moderate physical activity on most days. Ask your health care provide what type of  exercise is best for you. Where can I find more information? For more information, check out the following websites:  [place]: [employer]se  [employer]: http://www.niams.AnonymousEar.fr.asp  This information is not intended to replace advice given to you by your health care provider. Make sure you discuss any questions you have with your health care provider. Document Released: 10/06/2003 Document Revised: 02/03/2016 Document Reviewed: 07/21/2014 Elsevier Interactive Patient Education  [employer].

## 2017-11-05 NOTE — Progress Notes (Addendum)
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 Cough p ex as child but after childbirth 1991 noct cough >  Bad symptoms of cough /wheeze immediately p MCT in ? 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 Follow-up    Cough is much improved.   some throat clearing since working outside, convinced she has allergies/ better with 1st gen h1, still on dulera 100 one bid  rec Try to clear your throat - chlorpheniramine and hard rock candy will help Please see patient coordinator before you leave today  to schedule Kozlow evaluation but hold antihistamines first try off dulera to what difference if any this makes - ok to restart if worse  Call if neg work up for trial of gabapentin 100 mg three times day      05/03/2017  Extended  f/u ov/Imelda Dandridge re: re-establish UACS  - cough x 27 years, esp since 2010  Chief Complaint  Patient presents with  . Acute Visit    Pt states her cough has been worse for the past 3 months.    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 at the time the cough was gone Thinks maybe was Maintaining on chlortrimeton hs and arnuity  one daily / ppi and hs but not flonase  Pattern now = no noct cough but Wakes up 5 am then starts the throat clearing and dry cough  Not limited by breathing from desired activities   rec Gabapentin 100 mg three times a day  For drainage / throat tickle try take CHLORPHENIRAMINE  4 mg - take one every 4 hours as needed - available over the counter- may cause drowsiness so start with just a bedtime dose or two and see how you tolerate it before trying in daytime   No change in acid suppression or diet or asthmanex dosing for now     07/16/2017  f/u ov/Doak Mah re:  Cough x 27 years finally gone on gabapentin 100 mg at bedtime  Chief Complaint  Patient presents with  . Follow-up    Breathing is doing well and she is no longer coughing. She has not had to use her albuterol inhaler.   wakes up feeling rested after 6-7 hours using both 1st gen H1 blockers per guidelines  And gabapentin 100 at hs No sob at all though no aerobics / cut down her asmanex on her own to  1 bid/ no need for saba  rec No change in medications except try off asmanex to see if you notice any difference and if you get worse then restart  Please schedule a follow up visit in 3 months but call sooner if needed      11/05/2017  Acute  ov/Rodrickus Min re:  uacs flare p viral uri Chief Complaint  Patient presents with  . Acute Visit    Doing well has productive cough white in color Since 10/14/17, NO SOB     was doing great in terms of Dyspnea:  Not limited by breathing from desired activities  / no aerobics Cough: x  10/14/17 prior to trip to [place] worse noct - did not add full reflux regimen as previously instructed for flare. Sleep: waking up coughing  SABA use:  None    No obvious day to day or daytime variability or assoc excess/ purulent sputum or mucus plugs or hemoptysis or cp or chest tightness, subjective wheeze or overt sinus or hb symptoms. No unusual exposure hx or h/o childhood pna/ asthma or knowledge of premature  birth.    Also denies any obvious fluctuation of symptoms with weather or environmental changes or other aggravating or alleviating factors except as outlined above   Current Allergies, Complete Past Medical History, Past Surgical History, Family History, and Social History were reviewed in [employer] record.  ROS  The following are not active complaints unless bolded Hoarseness, sore throat, dysphagia, dental problems, itching, sneezing,  nasal congestion or discharge of excess mucus or purulent secretions, ear ache,   fever, chills, sweats, unintended wt loss or wt gain, classically pleuritic or exertional cp,  orthopnea pnd or arm/hand swelling  or leg swelling, presyncope, palpitations, abdominal pain, anorexia, nausea, vomiting, diarrhea  or change in bowel habits or change in bladder habits, change in stools or change in urine, dysuria, hematuria,  rash, arthralgias, visual complaints, headache, numbness, weakness or ataxia or problems with walking or coordination,  change in mood or  memory.        Current Meds  Medication Sig  . albuterol (PROAIR HFA) 108 (90 Base) MCG/ACT inhaler Inhale 2 puffs into the lungs every 6 (six) hours as needed for wheezing.  Marland Kitchen aspirin EC 81 MG tablet Take 1 tablet (81 mg total) by mouth daily.  Marland Kitchen atorvastatin (LIPITOR) 10 MG tablet Take 10 mg by mouth daily.  . chlorpheniramine (CHLOR-TRIMETON) 4 MG tablet Take 4 mg by mouth daily as needed for allergies.  . Cholecalciferol (VITAMIN D) 2000 units tablet Take 2,000 Units by mouth daily.  Marland Kitchen dextromethorphan-guaiFENesin (MUCINEX DM) 30-600 MG 12hr tablet Take 1 tablet by mouth 2 (two) times daily as needed for cough.  . doxylamine, Sleep, (UNISOM) 25 MG tablet Take 25 mg by mouth at bedtime as needed.  Marland Kitchen escitalopram (LEXAPRO) 20 MG tablet Take 20 mg by mouth daily.   . famotidine (PEPCID) 20 MG tablet TAKE 1 TABLET AT BEDTIME  . fenofibrate 160 MG tablet TAKE 1 TABLET DAILY (KEEP  UPCOMING APPOINTMENT FOR FUTURE REFILLS)  . gabapentin (NEURONTIN) 100 MG capsule Take 1 capsule (100 mg total) by mouth daily.  Marland Kitchen ibuprofen (ADVIL,MOTRIN) 200 MG tablet Take 400 mg by mouth every 6 (six) hours as needed for headache or mild pain.   Marland Kitchen levothyroxine (SYNTHROID, LEVOTHROID) 75 MCG tablet Take 1 tablet (75 mcg total) by mouth daily.  Marland Kitchen lithium carbonate (LITHOBID) 300 MG CR tablet daily. Pt is taking 1 tab in  the AM  . Misc. Devices (ACAPELLA) MISC Use as directed  . modafinil (PROVIGIL) 100 MG tablet Take 25 mg by mouth daily.   . Multiple Vitamins-Minerals (MULTIVITAMIN WITH MINERALS) tablet Take 1 tablet by mouth daily.  . pantoprazole (PROTONIX) 40 MG tablet TAKE 1 TABLET BY MOUTH EVERY DAY  . phenylephrine (SUDAFED PE) 10 MG TABS tablet Take 10 mg by mouth every 4 (four) hours as needed.  [name]. YUVAFEM 10 MCG TABS vaginal tablet Place 1 tablet vaginally every 14 (fourteen) days.                           Objective:   Physical Exam   amb wf nad     11/05/2017          137 07/16/2017      140  09/24/2016        134  08/13/2016        136  07/19/2016      135 06/07/2016        133 05/16/16 134 lb (60.8 kg)  04/16/16 135 lb 3.2 oz (61.3 kg)  03/27/16 134 lb (60.8 kg)      Vital signs reviewed - Note on arrival 02 sats  98% on RA      HEENT: nl dentition, turbinates bilaterally, and oropharynx. Nl external ear canals without cough reflex   NECK :  without JVD/Nodes/TM/ nl carotid upstrokes bilaterally   LUNGS: no acc muscle use,  Nl contour chest which is clear to A and P bilaterally without cough on insp or exp maneuvers   CV:  RRR  no s3 or murmur or increase in P2, and no edema   ABD:  soft and nontender with nl inspiratory excursion in the supine position. No bruits or organomegaly appreciated, bowel sounds nl  MS:  Nl gait/ ext warm without deformities, calf tenderness, cyanosis or clubbing No obvious joint restrictions   SKIN: warm and dry  without lesions    NEURO:  alert, approp, nl sensorium with  no motor or cerebellar deficits apparent.                Assessment:

## 2017-11-05 NOTE — Patient Instructions (Signed)
For drainage / throat tickle try take CHLORPHENIRAMINE  4 mg - take one every 4 hours as needed - available over the counter- may cause drowsiness so start with just a bedtime dose or two and see how you tolerate it before trying in daytime    Ok to take extra gabapentin at bedtime until cough is gone completely    GERD (REFLUX)  is an extremely common cause of respiratory symptoms just like yours , many times with no obvious heartburn at all.    It can be treated with medication, but also with lifestyle changes including elevation of the head of your bed (ideally with 6 inch  bed blocks),  Smoking cessation, avoidance of late meals, excessive alcohol, and avoid fatty foods, chocolate, peppermint, colas, red wine, and acidic juices such as orange juice.  NO MINT OR MENTHOL PRODUCTS SO NO COUGH DROPS   USE SUGARLESS CANDY INSTEAD (Jolley ranchers or Stover's or Life Savers) or even ice chips will also do - the key is to swallow to prevent all throat clearing. NO OIL BASED VITAMINS - use powdered substitutes.   Please schedule a follow up visit in 3 months but call sooner if needed

## 2017-11-06 ENCOUNTER — Encounter: Payer: Self-pay | Admitting: Internal Medicine

## 2017-11-06 NOTE — Assessment & Plan Note (Signed)
Onset after childbirth 1991 - flutter valve rx  05/16/2016 >>> - Sinus CT 06/12/2016 No evidence for acute or chronic paranasal sinus disease. - CT chest hrct 06/12/2016 wnl x coronary calcium  - referred to [facility] 09/24/2016  > ? Better on arnuity suggesting component of eos bronchitis      - trial of gabapentin 100 tid 05/03/2017 >  Cough resolved, could not tol tid but did not recur p taper to 100 hs only    Of the three most common causes of  Sub-acute or recurrent or chronic cough, only one (GERD)  can actually contribute to/ trigger  the other two (asthma and post nasal drip syndrome)  and perpetuate the cylce of cough.  While not intuitively obvious, many patients with chronic low grade reflux do not cough until there is a primary insult that disturbs the protective epithelial barrier and exposes sensitive nerve endings.   This is typically viral as likely the case here  But also can be due to pnds     The point is that once this occurs, it is difficult to eliminate the cycle  using anything but a maximally effective acid suppression regimen at least in the short run, accompanied by an appropriate diet to address non acid GERD and control / eliminate the  pnds with 1st gen H1 blockers per guidelines  And add a second gabapentin at hs to control irritable larynx component   I had an extended discussion with the patient reviewing all relevant studies completed to date and  lasting 15 to 20 minutes of a 25 minute acute office visit    Each maintenance medication was reviewed in detail including most importantly the difference between maintenance and prns and under what circumstances the prns are to be triggered using an action plan format that is not reflected in the computer generated alphabetically organized AVS.    Please see AVS for specific instructions unique to this visit that I personally wrote and verbalized to the the pt in detail and then reviewed with pt  by my nurse highlighting  any  changes in therapy recommended at today's visit to their plan of care.

## 2017-11-06 NOTE — Assessment & Plan Note (Signed)
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 05/16/2016  rec Trial off breo and on dulera 100 2bid - FENO 05/24/2016  =   30 on dulera 100 2bid despite poor hfa - Allergy profile 05/24/2016 >  Eos 0.4 /  IgE 34 Neg RAST   - Repeat MCT  08/21/16 > neg > rec 09/24/2016 try off dulera  - allergy eval 10/31/16 Dr Lucie LeatherKozlow > c/w LPR - reported better on arnuity but still throat clearing 05/03/2017 > changed to asmanex 100 2bid  - 05/03/2017  After extensive coaching HFA effectiveness =    90% - 07/16/2017 try off asmanex as already had tapered down to one bid s flare > no flare as of 11/05/2017   Symptoms are much more consistent with upper airway irritability than asthma at this point and no   asthma treatment is needed.

## 2017-11-10 ENCOUNTER — Encounter: Payer: Self-pay | Admitting: Internal Medicine

## 2017-11-11 NOTE — Telephone Encounter (Signed)
No need to repeat  Hep c aby   Please edit on my chart

## 2017-11-11 NOTE — Telephone Encounter (Signed)
Please advise Dr Panosh, thanks.   

## 2017-12-17 ENCOUNTER — Telehealth: Payer: Self-pay | Admitting: Internal Medicine

## 2017-12-17 DIAGNOSIS — R945 Abnormal results of liver function studies: Secondary | ICD-10-CM

## 2017-12-17 DIAGNOSIS — R7989 Other specified abnormal findings of blood chemistry: Secondary | ICD-10-CM

## 2017-12-17 NOTE — Telephone Encounter (Signed)
Copied from CRM [id number]. Topic: Quick Communication - See Telephone Encounter >> Dec 17, 2017  1:23 PM Cipriano Bunker wrote: CRM for notification. See Telephone encounter for: 12/17/17.  Pt. Is asking for doctor to put order in for liver panel - she is wanting to go to [facility] to have drawn, as she works close to [employer].  Please call her so she can set appt. Up

## 2017-12-17 NOTE — Telephone Encounter (Signed)
Please advise Dr Panosh, thanks.   

## 2017-12-19 NOTE — Telephone Encounter (Signed)
I dont know how to put in in that order  But if ok with  [facility] ok with me .    Please place the order if ok with [facility]?  lfts and hep c aby  Dx abnormal lfts

## 2017-12-20 NOTE — Telephone Encounter (Signed)
Orders placed. Hep C Aby cancelled.  Pt made aware of labs placed with [facility] locations.   Pt is not having Hep C Aby test as per previous message Dr Fabian Sharp said did not need this.   Panosh, Neta Mends, MD  5:27 PM Note  No need to repeat  Hep c aby   Please edit on my chart       This was updated in the Health Maintenance section of her chart to reflect this being done.

## 2018-01-15 ENCOUNTER — Other Ambulatory Visit: Payer: Self-pay | Admitting: Internal Medicine

## 2018-01-31 ENCOUNTER — Ambulatory Visit (INDEPENDENT_AMBULATORY_CARE_PROVIDER_SITE_OTHER): Payer: BLUE CROSS/BLUE SHIELD | Admitting: Cardiology

## 2018-01-31 ENCOUNTER — Encounter: Payer: Self-pay | Admitting: Cardiology

## 2018-01-31 ENCOUNTER — Encounter: Payer: Self-pay | Admitting: Internal Medicine

## 2018-01-31 ENCOUNTER — Ambulatory Visit (INDEPENDENT_AMBULATORY_CARE_PROVIDER_SITE_OTHER): Payer: BLUE CROSS/BLUE SHIELD | Admitting: Internal Medicine

## 2018-01-31 VITALS — BP 130/70 | HR 64 | Ht 61.5 in | Wt 131.4 lb

## 2018-01-31 VITALS — BP 104/60 | HR 74 | Ht 61.5 in | Wt 131.4 lb

## 2018-01-31 DIAGNOSIS — E785 Hyperlipidemia, unspecified: Secondary | ICD-10-CM | POA: Diagnosis not present

## 2018-01-31 DIAGNOSIS — Z8249 Family history of ischemic heart disease and other diseases of the circulatory system: Secondary | ICD-10-CM | POA: Diagnosis not present

## 2018-01-31 DIAGNOSIS — R05 Cough: Secondary | ICD-10-CM | POA: Diagnosis not present

## 2018-01-31 DIAGNOSIS — R058 Other specified cough: Secondary | ICD-10-CM

## 2018-01-31 DIAGNOSIS — J45991 Cough variant asthma: Secondary | ICD-10-CM

## 2018-01-31 MED ORDER — FENOFIBRATE 160 MG PO TABS: ORAL_TABLET | ORAL | 3 refills | Status: DC

## 2018-01-31 MED ORDER — ATORVASTATIN CALCIUM 10 MG PO TABS: 10.0000 mg | ORAL_TABLET | Freq: Every day | ORAL | 3 refills | Status: DC

## 2018-01-31 NOTE — Progress Notes (Signed)
Cardiology Office Note    Date:  01/31/2018   ID:  Lisa JunkerCathryn D Lee, DOB 07/17/1961, MRN [id number]  PCP:  Madelin HeadingsPanosh, Wanda K, MD  Cardiologist:  Tobias AlexanderKatarina Trinita Devlin, MD   Chief complain: 4 months follow up  History of Present Illness:  Lisa Lee is a 57 y.o. female with hyperlipidemia and significant FH of premature CAD. She is very active and asymptomatic. She is on carb free diet and recently lost 15 lbs. Denies any SOB, CP, no LE edema, no orthopnea, palpitations or syncope. She underwent a calcium scoring in 2013 that was 0.   04/03/2017 - 4 months follow-up, she feels tired and she is working 5 days a week for 10 hours. She was started rosuvastatin, that she didn't tolerate, then atorvastatin, pravastatin, most recently switched to Livalo 1 mg po daily. However she didn't start using again, she feels that her shoulder pain and hip pain is related to her work rather than medicine. She will stop using it for a week and see if that helps. She has also noticed some mild memory impairment. No significant shortness of breath or chest pain. No lower extremity edema.  01/31/2018 -this is 1 year follow-up, she has been compliant with her medications, denies any chest pain shortness of breath, no lower extremity edema, orthopnea, paroxysmal nocturnal dyspnea, no palpitation, no claudications.  She has no side effects from her medications.  She had cholesterol checked in April of this year when her LDL was 111 but since then she changed her diet, she has been counting calories and lost 8 pounds since then.  She is currently 130 pounds with goal under 120 pounds.  Past Medical History:  Diagnosis Date  . Acid reflux   . Allergic rhinitis   . Asthma   . Depression   . Diarrhea 08/09/2011   Poss ibs  Vs other  Related to stress  consdier other eval if needed. Had colonoscoopy per dr Matthias HughsBuccini   . Hemorrhoids   . High cholesterol   . 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 Hypothyroidism   . Medication  side effect 11/01/2011   tussionex   distrubed sleep    Past Surgical History:  Procedure Laterality Date  . CERVICAL CONIZATION W/BX N/A 07/21/2015   Procedure: CONIZATION CERVIX WITH BIOPSY;  Surgeon: Kirkland HunArthur Stringer, MD;  Location: [facility];  Service: Gynecology;  Laterality: N/A;  . CERVICAL CRYOTHERAPY  1984  . COLPOSCOPY  08/26/13  . DILATION AND CURETTAGE, DIAGNOSTIC / THERAPEUTIC  1994   Blighted Ovum  . HYSTEROSCOPY     uterine polypectomy Jan 7th  . HYSTEROSCOPY WITH RESECTOSCOPE  08/04/2009   Removed polyp & IUD  . LYMPH NODE BIOPSY    . TONSILLECTOMY      Current Medications: Outpatient Medications Prior to Visit  Medication Sig Dispense Refill  . albuterol (PROAIR HFA) 108 (90 Base) MCG/ACT inhaler Inhale 2 puffs into the lungs every 6 (six) hours as needed for wheezing. 3 Inhaler 0  . aspirin EC 81 MG tablet Take 1 tablet (81 mg total) by mouth daily.    . chlorpheniramine (CHLOR-TRIMETON) 4 MG tablet Take 4 mg by mouth daily as needed for allergies.    . Cholecalciferol (VITAMIN D) 2000 units tablet Take 2,000 Units by mouth daily.    Marland Kitchen. dextromethorphan-guaiFENesin (MUCINEX DM) 30-600 MG 12hr tablet Take 1 tablet by mouth 2 (two) times daily as needed for cough.    . doxylamine, Sleep, (UNISOM) 25 MG tablet Take 25  mg by mouth at bedtime as needed.    Marland Kitchen escitalopram (LEXAPRO) 20 MG tablet Take 20 mg by mouth daily.     . famotidine (PEPCID) 20 MG tablet TAKE 1 TABLET AT BEDTIME 90 tablet 3  . gabapentin (NEURONTIN) 100 MG capsule Take 1 capsule (100 mg total) by mouth daily. 90 capsule 0  . ibuprofen (ADVIL,MOTRIN) 200 MG tablet Take 400 mg by mouth every 6 (six) hours as needed for headache or mild pain.     Marland Kitchen levothyroxine (SYNTHROID, LEVOTHROID) 75 MCG tablet TAKE 1 TABLET DAILY 90 tablet 3  . lithium carbonate (LITHOBID) 300 MG CR tablet daily. Pt is taking 1 tab in the AM    . Misc. Devices (ACAPELLA) MISC Use as directed 1 each 0  . modafinil (PROVIGIL) 100 MG tablet  Take 25 mg by mouth daily.     . Multiple Vitamins-Minerals (MULTIVITAMIN WITH MINERALS) tablet Take 1 tablet by mouth daily.    . pantoprazole (PROTONIX) 40 MG tablet TAKE 1 TABLET BY MOUTH EVERY DAY 90 tablet 0  . phenylephrine (SUDAFED PE) 10 MG TABS tablet Take 10 mg by mouth every 4 (four) hours as needed.    [name] 10 MCG TABS vaginal tablet Place 1 tablet vaginally every 14 (fourteen) days.     Marland Kitchen atorvastatin (LIPITOR) 10 MG tablet Take 10 mg by mouth daily.    . fenofibrate 160 MG tablet TAKE 1 TABLET DAILY (KEEP UPCOMING APPOINTMENT FOR FUTURE REFILLS) 90 tablet 1   No facility-administered medications prior to visit.      Allergies:   Penicillins; Atorvastatin; Pravastatin; and Rosuvastatin   Social History   Socioeconomic History  . Marital status: Married    Spouse name: Not on file  . Number of children: Not on file  . Years of education: Not on file  . Highest education level: Not on file  Occupational History  . Not on file  Social Needs  . Financial resource strain: Not on file  . Food insecurity:    Worry: Not on file    Inability: Not on file  . Transportation needs:    Medical: Not on file    Non-medical: Not on file  Tobacco Use  . Smoking status: Never Smoker  . Smokeless tobacco: Never Used  Substance and Sexual Activity  . Alcohol use: Yes    Alcohol/week: 0.0 oz    Comment: once a month  . Drug use: No  . Sexual activity: Yes    Birth control/protection: None  Lifestyle  . Physical activity:    Days per week: Not on file    Minutes per session: Not on file  . Stress: Not on file  Relationships  . Social connections:    Talks on phone: Not on file    Gets together: Not on file    Attends religious service: Not on file    Active member of club or organization: Not on file    Attends meetings of clubs or organizations: Not on file    Relationship status: Not on file  Other Topics Concern  . Not on file  Social History Narrative    And  [employer]. 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 Father had MI at age 65.  Sister stents at 47, CABG x 4 at 63. Brother at 18 CABG x 3.   ROS:   Please see the history of present illness.    ROS All other systems reviewed and are negative.  PHYSICAL EXAM:   VS:  BP 130/70   Pulse 64   Ht 5' 1.5" (1.562 m)   Wt 131 lb 6.4 oz (59.6 kg)   LMP 12/09/2013   SpO2 98%   BMI 24.43 kg/m    GEN: Well nourished, well developed, in no acute distress  HEENT: normal  Neck: no JVD, carotid bruits, or masses Cardiac: RRR; no murmurs, rubs, or gallops,no edema  Respiratory:  clear to auscultation bilaterally, normal work of breathing GI: soft, nontender, nondistended, + BS MS: no deformity or atrophy  Skin: warm and dry, no rash Neuro:  Alert and Oriented x 3, Strength and sensation are intact Psych: euthymic mood, full affect  Wt Readings from Last 3 Encounters:  01/31/18 131 lb 6.4 oz (59.6 kg)  01/31/18 131 lb 6.4 oz (59.6 kg)  11/05/17 137 lb (62.1 kg)    Studies/Labs Reviewed:   EKG:  EKG is ordered and shows SR, normal ECG.  Recent Labs: 11/05/2017: ALT 38; BUN 22; Creatinine, Ser 0.93; Hemoglobin 13.4; Platelets 380.0; Potassium 4.8; Sodium 139; TSH 1.84   Lipid Panel    Component Value Date/Time   CHOL 192 11/05/2017 1004   CHOL 165 01/07/2017 1117   TRIG 119.0 11/05/2017 1004   HDL 56.70 11/05/2017 1004   HDL 61 01/07/2017 1117   CHOLHDL 3 11/05/2017 1004   VLDL 23.8 11/05/2017 1004   LDLCALC 111 (H) 11/05/2017  1004   LDLCALC 84 01/07/2017 1117   LDLDIRECT 101.0 10/30/2016 1051    Additional studies/ records that were reviewed today include:  Calcium scoring in 2013: 0  EKG performed today 01/31/2018 shows normal sinus rhythm normal EKG unchanged from prior.  This was personally reviewed.    ASSESSMENT:    1. Hyperlipidemia, unspecified hyperlipidemia type   2. Family history of early CAD    PLAN:  In order of problems listed above:  1. The patient is active and completely asymptomatic. Her family h/o early CAD is very significant however the fact that she had normal calcium score 3 years ago is reassuring.we will continue primary prevention with atorvastatin, regular exercise and good diet, she is doing all the right things and complemented on it. 2. She has no signs of cholesterol deposits - xanthomas, xanthelasmas.  Medication Adjustments/Labs and Tests Ordered: Current medicines are reviewed at length with the patient today.  Concerns regarding medicines are outlined above.  Medication changes, Labs and Tests ordered today are listed in the Patient Instructions below. Patient Instructions  Medication Instructions:  Your physician recommends that you continue on your current medications as directed. Please refer to the Current Medication list given to you today.   Labwork: None ordered  Testing/Procedures: None ordered  Follow-Up: Your physician wants you to follow-up in: 1 year with Dr. Delton See. You will receive a reminder letter in the mail two months in advance. If you don't receive a letter, please call our office to schedule the follow-up appointment.   Any Other  Special Instructions Will Be Listed Below (If Applicable).     If you need a refill on your cardiac medications before your next appointment, please call your pharmacy.      Signed, Tobias Alexander, MD  01/31/2018 5:04 PM    [facility] [address] [place], [place], [place]  [address] Phone:  [phone]; Fax: [phone]

## 2018-01-31 NOTE — Patient Instructions (Signed)
Ok to increase the gabapentin to as much as 300 mg at bedtime when cough is active then wean back to 100 mg daily   Please schedule a follow up visit in 6 months but call sooner if needed

## 2018-01-31 NOTE — Patient Instructions (Signed)
Medication Instructions:  Your physician recommends that you continue on your current medications as directed. Please refer to the Current Medication list given to you today.   Labwork: None ordered  Testing/Procedures: None ordered  Follow-Up: Your physician wants you to follow-up in: 1 year with Dr. Nelson. You will receive a reminder letter in the mail two months in advance. If you don't receive a letter, please call our office to schedule the follow-up appointment.   Any Other Special Instructions Will Be Listed Below (If Applicable).     If you need a refill on your cardiac medications before your next appointment, please call your pharmacy.   

## 2018-01-31 NOTE — Progress Notes (Signed)
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 Cough p ex as child but after childbirth 1991 noct cough >  Bad symptoms of cough /wheeze immediately p MCT in ? 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 Follow-up    Cough is much improved.   some throat clearing since working outside, convinced she has allergies/ better with 1st gen h1, still on dulera 100 one bid  rec Try to clear your throat - chlorpheniramine and hard rock candy will help Please see patient coordinator before you leave today  to schedule Kozlow evaluation but hold antihistamines first try off dulera to what difference if any this makes - ok to restart if worse  Call if neg work up for trial of gabapentin 100 mg three times day      05/03/2017  Extended  f/u ov/Wert re: re-establish UACS  - cough x 27 years, esp since 2010  Chief Complaint  Patient presents with  . Acute Visit    Pt states her cough has been worse for the past 3 months.    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 at the time the cough was gone Thinks maybe was Maintaining on chlortrimeton hs and arnuity  one daily / ppi and hs but not flonase  Pattern now = no noct cough but Wakes up 5 am then starts the throat clearing and dry cough  Not limited by breathing from desired activities   rec Gabapentin 100 mg three times a day  For drainage / throat tickle try take CHLORPHENIRAMINE  4 mg - take one every 4 hours as needed - available over the counter- may cause drowsiness so start with just a bedtime dose or two and see how you tolerate it before trying in daytime   No change in acid suppression or diet or asthmanex dosing for now     07/16/2017  f/u ov/Wert re:  Cough x 27 years finally gone on gabapentin 100 mg at bedtime  Chief Complaint  Patient presents with  . Follow-up    Breathing is doing well and she is no longer coughing. She has not had to use her albuterol inhaler.   wakes up feeling rested after 6-7 hours using both 1st gen H1 blockers per guidelines  And gabapentin 100 at hs No sob at all though no aerobics / cut down her asmanex on her own to  1 bid/ no need for saba  rec No change in medications except try off asmanex to see if you notice any difference and if you get worse then restart  Please schedule a follow up visit in 3 months but call sooner if needed      11/05/2017  Acute  ov/Wert re:  uacs flare p viral uri Chief Complaint  Patient presents with  . Acute Visit    Doing well has productive cough white in color Since 10/14/17, NO SOB     was doing great in terms of Dyspnea:  Not limited by breathing from desired activities  / no aerobics Cough: x  10/14/17 prior to trip to [facility] worse noct - did not add full reflux regimen as previously instructed for flare. Sleep: waking up coughing  SABA use:  None  rec For drainage / throat tickle try take CHLORPHENIRAMINE  4 mg - take one every 4 hours as needed   Ok to take extra gabapentin at bedtime until cough is gone completely  GERD diet    01/31/2018  f/u ov/Wert re: uacs Chief Complaint  Patient presents with  .  Follow-up    increased cough over the past 3 days- non prod. She rarely uses her proair.   Dyspnea:  Not limited by breathing from desired activities   Cough: dry x 3 days / flared off gabapentin 100 mg one hs  Sleeping: more cough x 3 days at hs  SABA use: rare  No obvious day to day or daytime variability or assoc excess/ purulent sputum or mucus plugs or hemoptysis or cp or chest tightness, subjective wheeze or overt sinus or hb symptoms.    Also denies any obvious fluctuation of symptoms with weather or environmental changes or other aggravating or alleviating factors except as outlined above   No unusual exposure hx or h/o childhood pna/ asthma or knowledge of premature birth.  Current Allergies, Complete Past Medical History, Past Surgical History, Family History, and Social History were reviewed in [employer] record.  ROS  The following are not active complaints unless bolded Hoarseness, sore throat, dysphagia, dental problems, itching, sneezing,  nasal congestion or discharge of excess mucus or purulent secretions, ear ache,   fever, chills, sweats, unintended wt loss or wt gain, classically pleuritic or exertional cp,  orthopnea pnd or arm/hand swelling  or leg swelling, presyncope, palpitations, abdominal pain, anorexia, nausea, vomiting, diarrhea  or change in bowel habits or change in bladder habits, change in stools or change in urine, dysuria, hematuria,  rash, arthralgias, visual complaints, headache, numbness, weakness or ataxia or problems with walking or coordination,  change in mood or  memory.        Current Meds  Medication Sig  . albuterol (PROAIR HFA) 108 (90 Base) MCG/ACT inhaler Inhale 2 puffs into the lungs every 6 (six) hours as needed for wheezing.  Marland Kitchen aspirin EC 81 MG tablet Take 1 tablet (81 mg total) by mouth daily.  . chlorpheniramine (CHLOR-TRIMETON) 4 MG tablet Take 4 mg by mouth daily as needed for allergies.  . Cholecalciferol  (VITAMIN D) 2000 units tablet Take 2,000 Units by mouth daily.  Marland Kitchen dextromethorphan-guaiFENesin (MUCINEX DM) 30-600 MG 12hr tablet Take 1 tablet by mouth 2 (two) times daily as needed for cough.  . doxylamine, Sleep, (UNISOM) 25 MG tablet Take 25 mg by mouth at bedtime as needed.  Marland Kitchen escitalopram (LEXAPRO) 20 MG tablet Take 20 mg by mouth daily.   . famotidine (  PEPCID) 20 MG tablet TAKE 1 TABLET AT BEDTIME  . gabapentin (NEURONTIN) 100 MG capsule Take 1 capsule (100 mg total) by mouth daily.  Marland Kitchen. ibuprofen (ADVIL,MOTRIN) 200 MG tablet Take 400 mg by mouth every 6 (six) hours as needed for headache or mild pain.   Marland Kitchen. levothyroxine (SYNTHROID, LEVOTHROID) 75 MCG tablet TAKE 1 TABLET DAILY  . lithium carbonate (LITHOBID) 300 MG CR tablet daily. Pt is taking 1 tab in the AM  . Misc. Devices (ACAPELLA) MISC Use as directed  . modafinil (PROVIGIL) 100 MG tablet Take 25 mg by mouth daily.   . Multiple Vitamins-Minerals (MULTIVITAMIN WITH MINERALS) tablet Take 1 tablet by mouth daily.  . pantoprazole (PROTONIX) 40 MG tablet TAKE 1 TABLET BY MOUTH EVERY DAY  . phenylephrine (SUDAFED PE) 10 MG TABS tablet Take 10 mg by mouth every 4 (four) hours as needed.  [name]. YUVAFEM 10 MCG TABS vaginal tablet Place 1 tablet vaginally every 14 (fourteen) days.   . [DISCONTINUED] atorvastatin (LIPITOR) 10 MG tablet Take 10 mg by mouth daily.  . [DISCONTINUED] fenofibrate 160 MG tablet TAKE 1 TABLET DAILY (KEEP UPCOMING APPOINTMENT FOR FUTURE REFILLS)                                Objective:   Physical Exam   amb wf nad     01/31/2018          131  11/05/2017          137 07/16/2017      140  09/24/2016        134  08/13/2016        136  07/19/2016      135 06/07/2016        133 05/16/16 134 lb (60.8 kg)  04/16/16 135 lb 3.2 oz (61.3 kg)  03/27/16 134 lb (60.8 kg)    Vital signs reviewed - Note on arrival 02 sats  98% on RA      HEENT: nl dentition, turbinates bilaterally, and oropharynx. Nl external  ear canals without cough reflex   NECK :  without JVD/Nodes/TM/ nl carotid upstrokes bilaterally   LUNGS: no acc muscle use,  Nl contour chest which is clear to A and P bilaterally without cough on insp or exp maneuvers   CV:  RRR  no s3 or murmur or increase in P2, and no edema   ABD:  soft and nontender with nl inspiratory excursion in the supine position. No bruits or organomegaly appreciated, bowel sounds nl  MS:  Nl gait/ ext warm without deformities, calf tenderness, cyanosis or clubbing No obvious joint restrictions   SKIN: warm and dry without lesions    NEURO:  alert, approp, nl sensorium with  no motor or cerebellar deficits apparent.                Assessment:

## 2018-02-01 ENCOUNTER — Encounter: Payer: Self-pay | Admitting: Internal Medicine

## 2018-02-01 NOTE — Assessment & Plan Note (Addendum)
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 05/16/2016  rec Trial off breo and on dulera 100 2bid - FENO 05/24/2016  =   30 on dulera 100 2bid despite poor hfa - Allergy profile 05/24/2016 >  Eos 0.4 /  IgE 34 Neg RAST   - Repeat MCT  08/21/16 > neg > rec 09/24/2016 try off dulera  - allergy eval 10/31/16 Dr Neldon Mc > c/w LPR - reported better on arnuity but still throat clearing 05/03/2017 > changed to asmanex 100 2bid  - 05/03/2017  After extensive coaching HFA effectiveness =    90%  - 07/16/2017 try off asmanex as already had tapered down to one bid s flare > no flare as of 11/05/2017   All goals of chronic asthma control met including optimal function and elimination of symptoms with minimal need for rescue therapy.  Contingencies discussed in full including contacting this office immediately if not controlling the symptoms using the rule of two's.

## 2018-02-01 NOTE — Assessment & Plan Note (Signed)
Onset after childbirth 1991 - flutter valve rx  05/16/2016 >>> - Sinus CT 06/12/2016 No evidence for acute or chronic paranasal sinus disease. - CT chest hrct 06/12/2016 wnl x coronary calcium  [facility]  > ? Better on arnuity suggesting component of eos bronchitis (see sep a/p)   - trial of gabapentin 100 tid 05/03/2017 >  Cough resolved, could not tol tid but did not recur p taper to 100 hs only   - 01/31/2018 reported flare off gabapentin > rec 100 mg x 3 hs until better then maint on 100mg  daily or whatever dose she needs to control the cough with the only down side mild daytime drowsiness

## 2018-02-08 ENCOUNTER — Other Ambulatory Visit: Payer: Self-pay | Admitting: Internal Medicine

## 2018-02-09 ENCOUNTER — Encounter: Payer: Self-pay | Admitting: Internal Medicine

## 2018-02-10 ENCOUNTER — Other Ambulatory Visit: Payer: Self-pay | Admitting: Cardiology

## 2018-02-10 NOTE — Telephone Encounter (Signed)
MW please advise.  Thanks.  

## 2018-02-10 NOTE — Telephone Encounter (Signed)
Fine with me to give another 100 pills of the 100 mg strength or she can have a bottle of the 300 # 30 to take hs  when cough is active with refill x 11

## 2018-02-11 ENCOUNTER — Other Ambulatory Visit: Payer: Self-pay | Admitting: Internal Medicine

## 2018-02-11 ENCOUNTER — Other Ambulatory Visit (INDEPENDENT_AMBULATORY_CARE_PROVIDER_SITE_OTHER): Payer: BLUE CROSS/BLUE SHIELD

## 2018-02-11 DIAGNOSIS — R945 Abnormal results of liver function studies: Secondary | ICD-10-CM

## 2018-02-11 DIAGNOSIS — R7989 Other specified abnormal findings of blood chemistry: Secondary | ICD-10-CM

## 2018-02-11 LAB — HEPATIC FUNCTION PANEL
ALBUMIN: 4.7 g/dL (ref 3.5–5.2)
ALT: 32 U/L (ref 0–35)
AST: 21 U/L (ref 0–37)
Alkaline Phosphatase: 49 U/L (ref 39–117)
Bilirubin, Direct: 0.1 mg/dL (ref 0.0–0.3)
TOTAL PROTEIN: 7.2 g/dL (ref 6.0–8.3)
Total Bilirubin: 0.4 mg/dL (ref 0.2–1.2)

## 2018-02-11 MED ORDER — GABAPENTIN 100 MG PO CAPS: 100.0000 mg | ORAL_CAPSULE | Freq: Every day | ORAL | 3 refills | Status: DC

## 2018-02-12 MED ORDER — GABAPENTIN 300 MG PO CAPS: 300.0000 mg | ORAL_CAPSULE | Freq: Every day | ORAL | 0 refills | Status: DC

## 2018-02-12 NOTE — Telephone Encounter (Signed)
Refill of the Gabapentin 300mg  #30 sent as okayed by MW below Nothing further needed; will sign off

## 2018-02-18 ENCOUNTER — Other Ambulatory Visit: Payer: Self-pay | Admitting: Internal Medicine

## 2018-02-18 NOTE — Telephone Encounter (Signed)
Copied from CRM [phone]. Topic: Quick Communication - See Telephone Encounter >> Feb 18, 2018 10:25 AM Jolayne Hainesaylor, Brittany L wrote: CRM for notification. See Telephone encounter for: 02/18/18.  [facility] prescription for pantoprazole (PROTONIX) 40 MG tablet.

## 2018-02-19 MED ORDER — PANTOPRAZOLE SODIUM 40 MG PO TBEC: 40.0000 mg | DELAYED_RELEASE_TABLET | Freq: Every day | ORAL | 1 refills | Status: DC

## 2018-02-19 NOTE — Telephone Encounter (Signed)
LOV 11/05/17 [name] Last refill 02/10/18 # 90 with 0 refill to local pharmacy. Mail order pharmacy asking for a new rx.

## 2018-03-24 ENCOUNTER — Ambulatory Visit: Payer: BLUE CROSS/BLUE SHIELD | Admitting: Internal Medicine

## 2018-03-24 ENCOUNTER — Encounter: Payer: Self-pay | Admitting: Internal Medicine

## 2018-03-24 VITALS — BP 126/70 | HR 70 | Temp 98.4°F | Wt 131.1 lb

## 2018-03-24 DIAGNOSIS — J22 Unspecified acute lower respiratory infection: Secondary | ICD-10-CM

## 2018-03-24 NOTE — Progress Notes (Signed)
Chief Complaint  Patient presents with  . Sore Throat    since friday  . Cough    with green sputum and body aches, fatigue    HPI: Lisa Lee 57 y.o. sda  Has hx of cough variant asthma and upper airway cough problem  Prodrome with headache and then diarrhea and cough   Fever but feels feverish .  Had been better   From baseline cough .   Has been taking  Delsym and  Gabapentin.  Helped . Client   6321 mos old with a trach  patient had  Coughing green .   Illness put of omnicef    Probably got from him .  Still a bit achy but no rfeer  No sob and no wheeezing    ROS: See pertinent positives and negatives per HPI.  Past Medical History:  Diagnosis Date  . Acid reflux   . Allergic rhinitis   . Asthma   . Depression   . Diarrhea 08/09/2011   Poss ibs  Vs other  Related to stress  consdier other eval if needed. Had colonoscoopy per dr Matthias HughsBuccini   . Hemorrhoids   . High cholesterol   . 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 Hypothyroidism   . Medication side effect 11/01/2011   tussionex   distrubed sleep     Family History  Problem Relation Age of Onset  . Heart disease Sister [id number]       cabg stent  . Arthritis Mother   . Heart disease Mother   . Heart disease Father   . Colon cancer Father   . Parkinson's disease Father   . Pulmonary embolism Daughter   . Heart disease Sister   . Thyroid disease Sister     Social History   Socioeconomic History  . Marital status: Married    Spouse name: Not on file  . Number of children: Not on file  . Years of education: Not on file  . Highest education level: Not on file  Occupational History  . Not on file  Social Needs  . Financial resource strain: Not on file  . Food insecurity:    Worry: Not on file    Inability: Not on file  . Transportation needs:    Medical: Not on file    Non-medical: Not on file  Tobacco Use  . Smoking status: Never Smoker  . Smokeless tobacco: Never Used  Substance and Sexual Activity  . Alcohol  use: Yes    Alcohol/week: 0.0 standard drinks    Comment: once a month  . Drug use: No  . Sexual activity: Yes    Birth control/protection: None  Lifestyle  . Physical activity:    Days per week: Not on file    Minutes per session: Not on file  . Stress: Not on file  Relationships  . Social connections:    Talks on phone: Not on file    Gets together: Not on file    Attends religious service: Not on file    Active member of club or organization: Not on file    Attends meetings of clubs or organizations: Not on file    Relationship status: Not on file  Other Topics Concern  . Not on file  Social History Narrative    [employer]. 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 albuterol (PROAIR HFA) 108 (90 Base) MCG/ACT inhaler Inhale 2 puffs into the lungs every 6 (six) hours as needed for wheezing. 3 Inhaler 0  . aspirin EC 81 MG tablet Take 1 tablet (81 mg total) by mouth daily.    Marland Kitchen atorvastatin (LIPITOR) 10 MG tablet TAKE 1 TABLET DAILY 90 tablet 3  . chlorpheniramine (CHLOR-TRIMETON) 4 MG tablet Take 4 mg by mouth daily as needed for allergies.    . Cholecalciferol (VITAMIN D) 2000 units tablet Take 2,000 Units by mouth daily.    Marland Kitchen dextromethorphan-guaiFENesin (MUCINEX DM) 30-600 MG 12hr tablet Take 1 tablet by mouth 2 (two) times daily as needed for cough.    . doxylamine, Sleep, (UNISOM) 25 MG tablet Take 25 mg by mouth at bedtime as needed.    Marland Kitchen escitalopram (LEXAPRO) 20 MG tablet Take 20 mg by mouth daily.     . famotidine (PEPCID) 20 MG tablet TAKE 1 TABLET AT BEDTIME 90 tablet 3  . fenofibrate 160 MG tablet TAKE 1 TABLET DAILY 90 tablet 3  . gabapentin (NEURONTIN) 100 MG capsule Take 1 capsule (100 mg total) by mouth daily. 270 capsule 3  .  ibuprofen (ADVIL,MOTRIN) 200 MG tablet Take 400 mg by mouth every 6 (six) hours as needed for headache or mild pain.     Marland Kitchen levothyroxine (SYNTHROID, LEVOTHROID) 75 MCG tablet TAKE 1 TABLET DAILY 90 tablet 3  . lithium carbonate (LITHOBID) 300 MG CR tablet daily. Pt is taking 1 tab in the AM    . Misc. Devices (ACAPELLA) MISC Use as directed 1 each 0  . modafinil (PROVIGIL) 100 MG tablet Take 25 mg by mouth daily.     . Multiple Vitamins-Minerals (MULTIVITAMIN WITH MINERALS) tablet Take 1 tablet by mouth daily.    . pantoprazole (PROTONIX) 40 MG tablet Take 1 tablet (40 mg total) by mouth daily. 90 tablet 1  . phenylephrine (SUDAFED PE) 10 MG TABS tablet Take 10 mg by mouth every 4 (four) hours as needed.    [name] 10 MCG TABS vaginal tablet Place 1 tablet vaginally every 14 (fourteen) days.     Marland Kitchen gabapentin (NEURONTIN) 300 MG capsule Take 1 capsule (300 mg total) by mouth at bedtime. 30 capsule 0   No facility-administered medications prior to visit.      EXAM:  BP 126/70 (BP Location: Right Arm, Patient Position: Sitting, Cuff Size: Normal)   Pulse 70   Temp 98.4 F (36.9 C) (Oral)   Wt 131 lb 2 oz (59.5 kg)   LMP 12/09/2013   SpO2 98%   BMI 24.37 kg/m   Body mass index is 24.37 kg/m. WDWN in NAD  quiet respirations;  somewhat hoarse. Non toxic . HEENT: Normocephalic ;atraumatic , Eyes;  PERRL, EOMs  Full, lids and conjunctiva clear,,Ears: no deformities, canals nl, TM landmarks normal, Nose: no deformity or discharge but congested;face minimally tender Mouth : OP clear without lesion or edema  Cobblestoning . Neck: Supple without adenopathy or masses or bruits Chest:  Clear to A without wheezes rales or rhonchi CV:  S1-S2 no gallops or murmurs peripheral perfusion is normal Skin :nl perfusion and no acute rashes   ASSESSMENT AND PLAN:  Discussed the following assessment and plan:  Acute respiratory infection prob viral resp infection no evidence  of  pna     4-5 days into   Sx   Expectant management. And rest      Disc  Getting flu vaccine  In the fall  .  No alarm findings or sx  At this time.  -Patient advised to return or notify health care team  if symptoms worsen ,persist or new concerns arise.  Patient Instructions  Exam is reassuring   This is most likely viral resp infection that should resolved  on its own. Expect improvement the next 5 days.  Stay on your cough regimen .         Neta Mends. Deborah Dondero M.D.

## 2018-03-24 NOTE — Patient Instructions (Addendum)
Exam is reassuring   This is most likely viral resp infection that should resolved  on its own. Expect improvement the next 5 days.  Stay on your cough regimen .

## 2018-04-10 ENCOUNTER — Encounter: Payer: Self-pay | Admitting: Gastroenterology

## 2018-04-16 ENCOUNTER — Other Ambulatory Visit: Payer: Self-pay | Admitting: Cardiology

## 2018-04-16 DIAGNOSIS — E785 Hyperlipidemia, unspecified: Secondary | ICD-10-CM

## 2018-04-25 ENCOUNTER — Ambulatory Visit (INDEPENDENT_AMBULATORY_CARE_PROVIDER_SITE_OTHER): Payer: BLUE CROSS/BLUE SHIELD | Admitting: Family Medicine

## 2018-04-25 ENCOUNTER — Other Ambulatory Visit: Payer: Self-pay

## 2018-04-25 ENCOUNTER — Encounter: Payer: Self-pay | Admitting: Family Medicine

## 2018-04-25 VITALS — BP 102/64 | HR 70 | Temp 97.8°F | Ht 61.5 in | Wt 130.9 lb

## 2018-04-25 DIAGNOSIS — K148 Other diseases of tongue: Secondary | ICD-10-CM

## 2018-04-25 NOTE — Patient Instructions (Signed)
We will set up ENT

## 2018-04-25 NOTE — Progress Notes (Signed)
Subjective:     Patient ID: Lisa Lee, female   DOB: Apr 21, 1961, 57 y.o.   MRN: [id number]  HPI Patient seen with some discoloration tip of tongue.  She noticed this several weeks ago.  She was concerned whether this could be precancerous type change.  She has some mild discomfort.  She is not aware of any definite trauma.  She states she has past history of HPV and was concerned whether she may be at high risk for oral cancers.  No history of tobacco use.  Denies any appetite or weight changes.  No recent neck adenopathy.  Past Medical History:  Diagnosis Date  . Acid reflux   . Allergic rhinitis   . Asthma   . Depression   . Diarrhea 08/09/2011   Poss ibs  Vs other  Related to stress  consdier other eval if needed. Had colonoscoopy per dr Matthias Hughs   . Hemorrhoids   . High cholesterol   . 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 Hypothyroidism   . Medication side effect 11/01/2011   tussionex   distrubed sleep    Past Surgical History:  Procedure Laterality Date  . CERVICAL CONIZATION W/BX N/A 07/21/2015   Procedure: CONIZATION CERVIX WITH BIOPSY;  Surgeon: Kirkland Hun, MD;  Location: [facility];  Service: Gynecology;  Laterality: N/A;  . CERVICAL CRYOTHERAPY  1984  . COLPOSCOPY  08/26/13  . DILATION AND CURETTAGE, DIAGNOSTIC / THERAPEUTIC  1994   Blighted Ovum  . HYSTEROSCOPY     uterine polypectomy Jan 7th  . HYSTEROSCOPY WITH RESECTOSCOPE  08/04/2009   Removed polyp & IUD  . LYMPH NODE BIOPSY    . TONSILLECTOMY      reports that she has never smoked. She has never used smokeless tobacco. She reports that she drinks alcohol. She reports that she does not use drugs. family history includes Arthritis in her mother; Colon cancer in her father; Heart disease in her father, mother, and sister; Heart disease (age of onset: 66) in her sister; Parkinson's disease in her father; Pulmonary embolism in her daughter; Thyroid disease in her sister. Allergies  Allergen Reactions  . Penicillins Hives  and Shortness Of Breath    Has patient had a PCN reaction causing immediate rash, facial/tongue/throat swelling, SOB or lightheadedness with hypotension: Yes Has patient had a PCN reaction causing severe rash involving mucus membranes or skin necrosis: No Has patient had a PCN reaction that required hospitalization No Has patient had a PCN reaction occurring within the last 10 years: No If all of the above answers are "NO", then may proceed with Cephalosporin use.   . Atorvastatin Other (See Comments)    Causes lower extremity muscle aches  . Pravastatin Other (See Comments)    Cause muscle aches and cramps  . Rosuvastatin Other (See Comments)    Caused pain in hands, shoulders, back, and indigestion     Review of Systems  Constitutional: Negative for appetite change and unexpected weight change.  Hematological: Negative for adenopathy.       Objective:   Physical Exam  Constitutional: She appears well-developed and well-nourished.  HENT:  He has some whitish discoloration involving the tip of the tongue.  No ulceration.  Minimally indurated to palpation       Assessment:     Whitish discoloration tip of tongue.    Plan:     -Set up ENT referral for further evaluation  Kristian Covey MD [facility]

## 2018-05-23 ENCOUNTER — Other Ambulatory Visit (INDEPENDENT_AMBULATORY_CARE_PROVIDER_SITE_OTHER): Payer: BLUE CROSS/BLUE SHIELD

## 2018-05-23 ENCOUNTER — Encounter: Payer: Self-pay | Admitting: Gastroenterology

## 2018-05-23 ENCOUNTER — Ambulatory Visit (INDEPENDENT_AMBULATORY_CARE_PROVIDER_SITE_OTHER): Payer: BLUE CROSS/BLUE SHIELD | Admitting: Gastroenterology

## 2018-05-23 VITALS — BP 104/62 | HR 72 | Ht 61.5 in | Wt 130.4 lb

## 2018-05-23 DIAGNOSIS — R194 Change in bowel habit: Secondary | ICD-10-CM

## 2018-05-23 DIAGNOSIS — Z800 Family history of malignant neoplasm of digestive organs: Secondary | ICD-10-CM

## 2018-05-23 DIAGNOSIS — Z860100 Personal history of colon polyps, unspecified: Secondary | ICD-10-CM

## 2018-05-23 LAB — C-REACTIVE PROTEIN: CRP: 0.1 mg/dL — ABNORMAL LOW (ref 0.5–20.0)

## 2018-05-23 LAB — IGA: IgA: 106 mg/dL (ref 68–378)

## 2018-05-23 LAB — SEDIMENTATION RATE: SED RATE: 7 mm/h (ref 0–30)

## 2018-05-23 NOTE — Progress Notes (Signed)
Referring Provider: Burnis Medin, MD Primary Care Physician:  Lisa Medin, MD   Reason for Consultation: Change in bowel habits   IMPRESSION:  Change in bowel habits x 40 years History of colon polyps    - 62m cecal tubular adenoma on colonoscopy 2016 Family history of colon cancer (father before the age of 668    - colonoscopy every 5 years with Dr. BCristina Gongsince age [age]Prior hemorrhoid surgery 25 years ago  The differential diagnosis of chronic diarrhea without alarm features includes: irritable bowel syndrome, IBD, celiac disease, missed infection (such as giardia), food intolerance,microscopic colitis, other functional GI disease.    PLAN:  - Fecal calprotectin, ESR, and CRP to screen for IBD - Giardia testing - IgA tissue transglutaminase, IgA level to test for celiac disease - EGD with duodenal biopsies given the patient's prior medical care - Colonoscopy with random biopsies and evaluation of the terminal ileum - Trial of stool bulking agent recommend between now and her endoscopy - Resume daily probiotic (Culturelle or Align discussed) - She was asked to keep a stool and food diary   I consented the patient at the bedside today discussing the risks, benefits, and alternatives to endoscopic evaluation. In particular, we discussed the risks that include, but are not limited to, reaction to medication, cardiopulmonary compromise, bleeding requiring blood transfusion, aspiration resulting in pneumonia, perforation requiring surgery, and even death. We reviewed the risk of missed lesion including polyps or even cancer. The patient acknowledges these risks and asks that we proceed.   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 Former [employer]  History is obtained through the patient and review of her electronic health record.  Many days of normal stools with 1-2 formed bowel  movements.Then a random day of non-radiating lower-abdominal cramping, urgency, and diarrhea of 4-5 loose stools x 40 years. Some mucous. No blood.  Symptoms first started as a teenager. Multiple near accidents. Carries Immodium with her. Previously controlled on 1, but now she has to use 2 to get the same response. Kept a diary with [employer] and has been unable to identify any food triggers. [place]food, salad, and heavy foods have had some association but not consistently. Minimal dairy in her diet.  Using Activa daily. No other probiotic use except for the use of Culturelle while on Keflex for strep throat several months ago.  Some weight fluctuations. No other associated symptoms. No identified exacerbating or relieving features.   Wants to get rid of the urgency. Doesn't want to end up in a nursing home having problems and looking for answers now.  She had a screening colonoscopy 02/14/2015 with Lisa Lee  History noted colon cancer in her father before the age of 624  The exam revealed a few medium mouth 5 diverticulum in the a sending colon, nonbleeding internal hemorrhoids, and a 2 mm cecal polyp. No GI symptoms noted in his records at that time.   History of hemorrhoids. Had hemorrhoid surgery after her second child.   Past Medical History:  Diagnosis Date  . Acid reflux   . Allergic rhinitis   . Asthma   . Depression   . Diarrhea 08/09/2011   Poss ibs  Vs other  Related to stress  consdier other eval if needed. Had colonoscoopy per dr Lisa Lee  . Hemorrhoids   . High cholesterol   . 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  Hypothyroidism   . Medication side effect 11/01/2011   tussionex   distrubed sleep     Past Surgical History:  Procedure Laterality Date  . CERVICAL CONIZATION W/BX N/A 07/21/2015   Procedure: CONIZATION CERVIX WITH BIOPSY;  Surgeon: Ena Dawley, MD;  Location: [facility];  Service: Gynecology;  Laterality: N/A;  . CERVICAL CRYOTHERAPY  1984  . COLPOSCOPY  08/26/13  .  DILATION AND CURETTAGE, DIAGNOSTIC / THERAPEUTIC  1994   Blighted Ovum  . HYSTEROSCOPY     uterine polypectomy Jan 7th  . HYSTEROSCOPY WITH RESECTOSCOPE  08/04/2009   Removed polyp & IUD  . LYMPH NODE BIOPSY    . TONSILLECTOMY      Prior to Admission medications   Medication Sig Start Date End Date Taking? Authorizing Provider  albuterol (PROAIR HFA) 108 (90 Base) MCG/ACT inhaler Inhale 2 puffs into the lungs every 6 (six) hours as needed for wheezing. 12/14/15   Panosh, Standley Brooking, MD  aspirin EC 81 MG tablet Take 1 tablet (81 mg total) by mouth daily. 08/28/11   Larey Dresser, MD  atorvastatin (LIPITOR) 10 MG tablet TAKE 1 TABLET DAILY 02/11/18   Dorothy Spark, MD  chlorpheniramine (CHLOR-TRIMETON) 4 MG tablet Take 4 mg by mouth daily as needed for allergies.    [provider]  Cholecalciferol (VITAMIN D) 2000 units tablet Take 2,000 Units by mouth daily.    [provider]  dextromethorphan-guaiFENesin (MUCINEX DM) 30-600 MG 12hr tablet Take 1 tablet by mouth 2 (two) times daily as needed for cough.    [provider]  doxylamine, Sleep, (UNISOM) 25 MG tablet Take 25 mg by mouth at bedtime as needed.    [provider]  escitalopram (LEXAPRO) 20 MG tablet Take 20 mg by mouth daily.     [provider]  famotidine (PEPCID) 20 MG tablet TAKE 1 TABLET AT BEDTIME 06/25/17   Tanda Rockers, MD  fenofibrate 160 MG tablet TAKE 1 TABLET DAILY 01/31/18   Dorothy Spark, MD  fenofibrate 160 MG tablet TAKE 1 TABLET BY MOUTH DAILY 04/17/18   Dorothy Spark, MD  gabapentin (NEURONTIN) 100 MG capsule Take 1 capsule (100 mg total) by mouth daily. 02/11/18   Tanda Rockers, MD  ibuprofen (ADVIL,MOTRIN) 200 MG tablet Take 400 mg by mouth every 6 (six) hours as needed for headache or mild pain.     [provider]  Influenza vac split quadrivalent PF (AFLURIA QUADRIVALENT) 0.5 ML injection Afluria Qd 2019-20 (36 mos up)(PF)60 mcg (15 mcg x4)/0.5  mL IM syringe  PHARMACIST TO INJECT ONE DOSE AS DIRECTED.    [provider]  levothyroxine (SYNTHROID, LEVOTHROID) 75 MCG tablet TAKE 1 TABLET DAILY 01/16/18   Panosh, Standley Brooking, MD  lidocaine (XYLOCAINE) 5 % ointment APPLY TO AFFECTED AREA 4 TIMES DAILY AS NEEDED 04/10/18   [provider]  lithium carbonate (LITHOBID) 300 MG CR tablet daily. Pt is taking 1 tab in the AM    [provider]  Misc. Devices (ACAPELLA) MISC Use as directed 05/16/16   Tanda Rockers, MD  modafinil (PROVIGIL) 100 MG tablet Take 25 mg by mouth daily.     [provider]  Multiple Vitamins-Minerals (MULTIVITAMIN WITH MINERALS) tablet Take 1 tablet by mouth daily.    [provider]  pantoprazole (PROTONIX) 40 MG tablet Take 1 tablet (40 mg total) by mouth daily. 02/19/18   Panosh, Standley Brooking, MD  phenylephrine (SUDAFED PE) 10 MG TABS tablet  Take 10 mg by mouth every 4 (four) hours as needed.    [provider]  valACYclovir (VALTREX) 500 MG tablet valacyclovir 500 mg tablet  For outbreak: Take one tablet by oral route twice a day for 3 days. For suppression: Take one tablet by oral route daily.    [provider]  YUVAFEM 10 MCG TABS vaginal tablet Place 1 tablet vaginally every 14 (fourteen) days.  10/15/16   [provider]    Current Outpatient Medications  Medication Sig Dispense Refill  . albuterol (PROAIR HFA) 108 (90 Base) MCG/ACT inhaler Inhale 2 puffs into the lungs every 6 (six) hours as needed for wheezing. 3 Inhaler 0  . aspirin EC 81 MG tablet Take 1 tablet (81 mg total) by mouth daily.    Marland Kitchen atorvastatin (LIPITOR) 10 MG tablet TAKE 1 TABLET DAILY 90 tablet 3  . chlorpheniramine (CHLOR-TRIMETON) 4 MG tablet Take 4 mg by mouth daily as needed for allergies.    . Cholecalciferol (VITAMIN D) 2000 units tablet Take 2,000 Units by mouth daily.    Marland Kitchen dextromethorphan-guaiFENesin (MUCINEX DM) 30-600 MG 12hr tablet Take 1 tablet by mouth 2 (two) times  daily as needed for cough.    . doxylamine, Sleep, (UNISOM) 25 MG tablet Take 25 mg by mouth at bedtime as needed.    Marland Kitchen escitalopram (LEXAPRO) 20 MG tablet Take 20 mg by mouth daily.     . famotidine (PEPCID) 20 MG tablet TAKE 1 TABLET AT BEDTIME 90 tablet 3  . fenofibrate 160 MG tablet TAKE 1 TABLET DAILY 90 tablet 3  . fenofibrate 160 MG tablet TAKE 1 TABLET BY MOUTH DAILY 90 tablet 2  . gabapentin (NEURONTIN) 100 MG capsule Take 1 capsule (100 mg total) by mouth daily. 270 capsule 3  . ibuprofen (ADVIL,MOTRIN) 200 MG tablet Take 400 mg by mouth every 6 (six) hours as needed for headache or mild pain.     . Influenza vac split quadrivalent PF (AFLURIA QUADRIVALENT) 0.5 ML injection Afluria Qd 2019-20 (36 mos up)(PF)60 mcg (15 mcg x4)/0.5 mL IM syringe  PHARMACIST TO INJECT ONE DOSE AS DIRECTED.    Marland Kitchen levothyroxine (SYNTHROID, LEVOTHROID) 75 MCG tablet TAKE 1 TABLET DAILY 90 tablet 3  . lidocaine (XYLOCAINE) 5 % ointment APPLY TO AFFECTED AREA 4 TIMES DAILY AS NEEDED  1  . lithium carbonate (LITHOBID) 300 MG CR tablet daily. Pt is taking 1 tab in the AM    . Misc. Devices (ACAPELLA) MISC Use as directed 1 each 0  . modafinil (PROVIGIL) 100 MG tablet Take 25 mg by mouth daily.     . Multiple Vitamins-Minerals (MULTIVITAMIN WITH MINERALS) tablet Take 1 tablet by mouth daily.    . pantoprazole (PROTONIX) 40 MG tablet Take 1 tablet (40 mg total) by mouth daily. 90 tablet 1  . phenylephrine (SUDAFED PE) 10 MG TABS tablet Take 10 mg by mouth every 4 (four) hours as needed.    . valACYclovir (VALTREX) 500 MG tablet valacyclovir 500 mg tablet  For outbreak: Take one tablet by oral route twice a day for 3 days. For suppression: Take one tablet by oral route daily.    Merril Abbe 10 MCG TABS vaginal tablet Place 1 tablet vaginally every 14 (fourteen) days.      No current facility-administered medications for this visit.     Allergies as of 05/23/2018 - Review Complete 04/25/2018  Allergen Reaction  Noted  . Penicillins Hives and Shortness Of Breath   . Atorvastatin Other (  See Comments) 03/28/2017  . Pravastatin Other (See Comments) 08/31/2016  . Rosuvastatin Other (See Comments) 01/09/2016    Family History  Problem Relation Age of Onset  . Heart disease Sister 19       cabg stent  . Arthritis Mother   . Heart disease Mother   . Heart disease Father   . Colon cancer Father   . Parkinson's disease Father   . Pulmonary embolism Daughter   . Heart disease Sister   . Thyroid disease Sister     Social History   Socioeconomic History  . Marital status: Married    Spouse name: Not on file  . Number of children: Not on file  . Years of education: Not on file  . Highest education level: Not on file  Occupational History  . Not on file  Social Needs  . Financial resource strain: Not on file  . Food insecurity:    Worry: Not on file    Inability: Not on file  . Transportation needs:    Medical: Not on file    Non-medical: Not on file  Tobacco Use  . Smoking status: Never Smoker  . Smokeless tobacco: Never Used  Substance and Sexual Activity  . Alcohol use: Yes    Alcohol/week: 0.0 standard drinks    Comment: once a month  . Drug use: No  . Sexual activity: Yes    Birth control/protection: None  Lifestyle  . Physical activity:    Days per week: Not on file    Minutes per session: Not on file  . Stress: Not on file  Relationships  . Social connections:    Talks on phone: Not on file    Gets together: Not on file    Attends religious service: Not on file    Active member of club or organization: Not on file    Attends meetings of clubs or organizations: Not on file    Relationship status: Not on file  . Intimate partner violence:    Fear of current or ex partner: Not on file    Emotionally abused: Not on file    Physically abused: Not on file    Forced sexual activity: Not on file  Other Topics Concern  . Not on file  Social History Narrative    And  [employer]. 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   Physical Exam: Vital signs were reviewed. General:   Alert, well-nourished, pleasant and cooperative in NAD Head:  Normocephalic and atraumatic. Eyes:  Sclera clear, no icterus.   Conjunctiva pink. Mouth:  No deformity or lesions.   Neck:  Supple; no thyromegaly. Lungs:  Clear throughout to auscultation.   No wheezes.  Heart:  Regular rate and rhythm; no murmurs Abdomen:  Soft, nontender, normal bowel sounds. No rebound or guarding. No hepatosplenomegaly Rectal:  Deferred  Msk:  Symmetrical without gross deformities. Extremities:  No gross deformities or edema. Neurologic:  Alert and  oriented x4;  grossly nonfocal Skin:  No rash or bruise. Psych:  Alert and cooperative. Normal mood and affect.   Chaelyn Bunyan L. Tarri Glenn Md, MPH [facility] 05/23/2018, 8:40 AM

## 2018-05-23 NOTE — Patient Instructions (Addendum)
I have recommended labs, stools studies, endoscopy and a trial of treatment to work to determine why you are having days of diarrhea.  Please use a daily stool bulking agent between now and the time of your endoscopy. I recommend Metamucil or Benefiber.   Continue with a daily probiotic such as Culturelle or Align.   Tips for colonoscopy:  -STAY WELL HYDRATED FOR 3-4 DAYS PRIOR TO THE EXAM. This reduces nausea and dehydration.  -TO PREVENT SKIN/HEMORRHOID IRRITATION- prior to wiping, put A&Dointment or vaseline on the toilet paper. -Keep a towel or pad on the bed.  -DRINK 64oz of clear liquids in the morning of prep day (PRIOR TO STARTING THE PREP) to be sure that there is enough fluid to flush the colon and stay hydrated!!!! This is in addition to the fluids required for preparation.

## 2018-05-26 LAB — GIARDIA ANTIGEN
MICRO NUMBER: 91286472
RESULT:: NOT DETECTED
SPECIMEN QUALITY:: ADEQUATE

## 2018-05-26 LAB — TISSUE TRANSGLUTAMINASE, IGA: (TTG) AB, IGA: 1 U/mL

## 2018-05-27 LAB — CALPROTECTIN, FECAL: CALPROTECTIN, FECAL: 56 ug/g (ref 0–120)

## 2018-06-06 ENCOUNTER — Other Ambulatory Visit: Payer: Self-pay | Admitting: Physician Assistant

## 2018-06-09 NOTE — Telephone Encounter (Signed)
Check lithium level °

## 2018-06-09 NOTE — Telephone Encounter (Signed)
Pt instructed to get lithium level, pt states she will go on Friday. 11/15.

## 2018-06-13 ENCOUNTER — Other Ambulatory Visit: Payer: Self-pay | Admitting: Psychiatry

## 2018-06-14 LAB — LITHIUM LEVEL: LITHIUM LVL: 0.8 mmol/L (ref 0.6–1.2)

## 2018-06-17 ENCOUNTER — Encounter: Payer: Self-pay | Admitting: Emergency Medicine

## 2018-06-17 DIAGNOSIS — G4700 Insomnia, unspecified: Secondary | ICD-10-CM

## 2018-06-17 DIAGNOSIS — F3175 Bipolar disorder, in partial remission, most recent episode depressed: Secondary | ICD-10-CM | POA: Insufficient documentation

## 2018-06-18 ENCOUNTER — Telehealth: Payer: Self-pay

## 2018-06-18 NOTE — Telephone Encounter (Signed)
-----   Message from [name], PA-C sent at 06/16/2018  6:09 PM EST ----- Let pt know the Lisa Lee level is perfect on Li ER 300 mg bid.  Cont same.

## 2018-06-25 ENCOUNTER — Encounter: Payer: Self-pay | Admitting: Gastroenterology

## 2018-06-28 ENCOUNTER — Other Ambulatory Visit: Payer: Self-pay | Admitting: Internal Medicine

## 2018-07-04 ENCOUNTER — Ambulatory Visit (AMBULATORY_SURGERY_CENTER): Payer: BLUE CROSS/BLUE SHIELD | Admitting: Gastroenterology

## 2018-07-04 ENCOUNTER — Encounter: Payer: Self-pay | Admitting: Gastroenterology

## 2018-07-04 VITALS — BP 96/75 | HR 70 | Temp 98.0°F | Resp 15 | Ht 61.5 in | Wt 130.0 lb

## 2018-07-04 DIAGNOSIS — K5520 Angiodysplasia of colon without hemorrhage: Secondary | ICD-10-CM

## 2018-07-04 DIAGNOSIS — Z86010 Personal history of colonic polyps: Secondary | ICD-10-CM

## 2018-07-04 DIAGNOSIS — R109 Unspecified abdominal pain: Secondary | ICD-10-CM

## 2018-07-04 DIAGNOSIS — R197 Diarrhea, unspecified: Secondary | ICD-10-CM

## 2018-07-04 DIAGNOSIS — K5730 Diverticulosis of large intestine without perforation or abscess without bleeding: Secondary | ICD-10-CM | POA: Diagnosis not present

## 2018-07-04 DIAGNOSIS — R194 Change in bowel habit: Secondary | ICD-10-CM

## 2018-07-04 HISTORY — PX: UPPER GI ENDOSCOPY: SHX6162

## 2018-07-04 HISTORY — PX: OTHER SURGICAL HISTORY: SHX169

## 2018-07-04 MED ORDER — SODIUM CHLORIDE 0.9 % IV SOLN: 500.0000 mL | Freq: Once | INTRAVENOUS | Status: DC

## 2018-07-04 NOTE — Patient Instructions (Signed)
YOU HAD AN ENDOSCOPIC PROCEDURE TODAY AT THE [facility]:   Refer to the procedure report that was given to you for any specific questions about what was found during the examination.  If the procedure report does not answer your questions, please call your gastroenterologist to clarify.  If you requested that your care partner not be given the details of your procedure findings, then the procedure report has been included in a sealed envelope for you to review at your convenience later.  YOU SHOULD EXPECT: Some feelings of bloating in the abdomen. Passage of more gas than usual.  Walking can help get rid of the air that was put into your GI tract during the procedure and reduce the bloating. If you had a lower endoscopy (such as a colonoscopy or flexible sigmoidoscopy) you may notice spotting of blood in your stool or on the toilet paper. If you underwent a bowel prep for your procedure, you may not have a normal bowel movement for a few days.  Please Note:  You might notice some irritation and congestion in your nose or some drainage.  This is from the oxygen used during your procedure.  There is no need for concern and it should clear up in a day or so.  SYMPTOMS TO REPORT IMMEDIATELY:   Following lower endoscopy (colonoscopy or flexible sigmoidoscopy):  Excessive amounts of blood in the stool  Significant tenderness or worsening of abdominal pains  Swelling of the abdomen that is new, acute  Fever of 100F or higher   Following upper endoscopy (EGD)  Vomiting of blood or coffee ground material  New chest pain or pain under the shoulder blades  Painful or persistently difficult swallowing  New shortness of breath  Fever of 100F or higher  Black, tarry-looking stools  For urgent or emergent issues, a gastroenterologist can be reached at any hour by calling [phone].   DIET:  We do recommend a small meal at first, but then you may proceed to your regular diet. Follow a  High Fiber diet. Drink plenty of fluids but you should avoid alcoholic beverages for 24 hours.  MEDICATIONS: Continue present medications.   Please see handouts given to you by your recovery nurse.  Follow up with Dr. Orvan FalconerBeavers in her office in 2 weeks.   ACTIVITY:  You should plan to take it easy for the rest of today and you should NOT DRIVE or use heavy machinery until tomorrow (because of the sedation medicines used during the test).    FOLLOW UP: Our staff will call the number listed on your records the next business day following your procedure to check on you and address any questions or concerns that you may have regarding the information given to you following your procedure. If we do not reach you, we will leave a message.  However, if you are feeling well and you are not experiencing any problems, there is no need to return our call.  We will assume that you have returned to your regular daily activities without incident.  If any biopsies were taken you will be contacted by phone or by letter within the next 1-3 weeks.  Please call us at [phone] if you have not heard about the biopsies in 3 weeks.   Thank you for allowing us to provide for your healthcare needs today.  SIGNATURES/CONFIDENTIALITY: You and/or your care partner have signed paperwork which will be entered into your electronic medical record.  These signatures attest to  the fact that that the information above on your After Visit Summary has been reviewed and is understood.  Full responsibility of the confidentiality of this discharge information lies with you and/or your care-partner.

## 2018-07-04 NOTE — Progress Notes (Signed)
Called to room to assist during endoscopic procedure.  Patient ID and intended procedure confirmed with present staff. Received instructions for my participation in the procedure from the performing physician.  

## 2018-07-04 NOTE — Op Note (Signed)
[facility] Patient Name: Lisa GalasCathryn Lee Procedure Date: 07/04/2018 7:58 AM MRN: [id number] Endoscopist: Tressia DanasKimberly Alinah Sheard MD, MD Age: 57 Referring MD:  Date of Birth: 02/11/1961 Gender: Female Account #: [id number] Procedure:                Colonoscopy Indications:              Chronic diarrhea Medicines:                See the Anesthesia note for documentation of the                            administered medications. History of tubular                            adenoma on colonoscopy 2016. Family history of                            colon cancer (father before age 58). Procedure:                Pre-Anesthesia Assessment:                           - Prior to the procedure, a History and Physical                            was performed, and patient medications and                            allergies were reviewed. The patient's tolerance of                            previous anesthesia was also reviewed. The risks                            and benefits of the procedure and the sedation                            options and risks were discussed with the patient.                            All questions were answered, and informed consent                            was obtained. Prior Anticoagulants: The patient has                            taken no previous anticoagulant or antiplatelet                            agents. ASA Grade Assessment: III - A patient with                            severe systemic disease. After reviewing the risks  and benefits, the patient was deemed in                            satisfactory condition to undergo the procedure.                           After obtaining informed consent, the colonoscope                            was passed under direct vision. Throughout the                            procedure, the patient's blood pressure, pulse, and                            oxygen saturations were  monitored continuously. The                            Colonoscope was introduced through the anus and                            advanced to the the terminal ileum, with                            identification of the appendiceal orifice and IC                            valve. The colonoscopy was performed without                            difficulty. The patient tolerated the procedure                            well. The quality of the bowel preparation was good. Scope In: 8:39:44 AM Scope Out: 8:53:18 AM Scope Withdrawal Time: 0 hours 10 minutes 55 seconds  Total Procedure Duration: 0 hours 13 minutes 34 seconds  Findings:                 The perianal and digital rectal examinations were                            normal.                           Diverticulosis present in the sigmoid colon. No                            evidence for diverticulitis.                           A small AVM was present in the ascending colon.                           The entire examined colon appeared normal. Biopsies  were taken with a cold forceps for histology.                           The terminal ileum appeared normal. Biopsies were                            taken with a cold forceps for histology.                           The exam was otherwise without abnormality on                            direct and retroflexion views. Complications:            No immediate complications. Estimated Blood Loss:     Estimated blood loss was minimal. Impression:               - A small AVM was present in the ascending colon.                           - Mild sigmoid diverticulosis without                            diverticulitis.                           - Colon and terminal ileum biopsies obtained given                            the history of unexplained diarrhea. Recommendation:           - Discharge patient to home.                           - Resume regular diet. High  fiber diet.                           - Continue present medications.                           - Surveillance colonoscopy due in 5 years.                           - Return to GI office in 2 weeks. Tressia Danas MD, MD 07/04/2018 9:05:28 AM This report has been signed electronically.

## 2018-07-04 NOTE — Op Note (Signed)
[facility] Patient Name: Lisa Lee Procedure Date: 07/04/2018 7:58 AM MRN: [id number] Endoscopist: Tressia Danas MD, MD Age: 57 Referring MD:  Date of Birth: [birth year] Gender: Female Account #: [id number] Procedure:                Upper GI endoscopy Indications:              Diarrhea - chronic Medicines:                See the Anesthesia note for documentation of the                            administered medications Procedure:                Pre-Anesthesia Assessment:                           - Prior to the procedure, a History and Physical                            was performed, and patient medications and                            allergies were reviewed. The patient's tolerance of                            previous anesthesia was also reviewed. The risks                            and benefits of the procedure and the sedation                            options and risks were discussed with the patient.                            All questions were answered, and informed consent                            was obtained. Prior Anticoagulants: The patient has                            taken no previous anticoagulant or antiplatelet                            agents. ASA Grade Assessment: III - A patient with                            severe systemic disease. After reviewing the risks                            and benefits, the patient was deemed in                            satisfactory condition to undergo the procedure.  After obtaining informed consent, the endoscope was                            passed under direct vision. Throughout the                            procedure, the patient's blood pressure, pulse, and                            oxygen saturations were monitored continuously. The                            Endoscope was introduced through the mouth, and                            advanced to the third part  of duodenum. The upper                            GI endoscopy was accomplished without difficulty.                            The patient tolerated the procedure well. Scope In: Scope Out: Findings:                 The examined esophagus was normal.                           Diffuse mildly erythematous mucosa without bleeding                            was found in the gastric body. Biopsies were taken                            with a cold forceps for histology.                           Diffuse mildly erythematous mucosa without active                            bleeding and with no stigmata of bleeding was found                            in the duodenal bulb. Biopsies were taken with a                            cold forceps for histology.                           The exam was otherwise without abnormality. Complications:            No immediate complications. Estimated Blood Loss:     Estimated blood loss was minimal. Impression:               - Normal esophagus.                           -  Erythematous mucosa in the gastric body. Biopsied.                           - Erythematous duodenopathy. Biopsied.                           - The examination was otherwise normal. Recommendation:           - Resume regular diet.                           - Continue present medications.                           - Proceed with colonoscopy today as previously                            scheduled. Tressia DanasKimberly Tanveer Brammer MD, MD 07/04/2018 8:58:52 AM This report has been signed electronically.

## 2018-07-04 NOTE — Progress Notes (Signed)
To PACU, VSS. Report to [email or web address] 

## 2018-07-07 ENCOUNTER — Telehealth: Payer: Self-pay | Admitting: *Deleted

## 2018-07-07 NOTE — Telephone Encounter (Signed)

## 2018-07-18 ENCOUNTER — Ambulatory Visit (INDEPENDENT_AMBULATORY_CARE_PROVIDER_SITE_OTHER): Payer: BLUE CROSS/BLUE SHIELD | Admitting: Gastroenterology

## 2018-07-18 ENCOUNTER — Encounter: Payer: Self-pay | Admitting: Gastroenterology

## 2018-07-18 VITALS — BP 124/66 | HR 60 | Ht 60.75 in | Wt 133.5 lb

## 2018-07-18 DIAGNOSIS — Z86010 Personal history of colonic polyps: Secondary | ICD-10-CM

## 2018-07-18 DIAGNOSIS — R194 Change in bowel habit: Secondary | ICD-10-CM | POA: Diagnosis not present

## 2018-07-18 DIAGNOSIS — Z800 Family history of malignant neoplasm of digestive organs: Secondary | ICD-10-CM | POA: Diagnosis not present

## 2018-07-18 NOTE — Progress Notes (Signed)
Referring Provider: Burnis Medin, MD Primary Care Physician:  Burnis Medin, MD   Chief complaint :  Change in bowel habits   IMPRESSION:  Change in bowel habits x 40 years History of colon polyps    - 46m cecal tubular adenoma on colonoscopy 2016 Family history of colon cancer (father before the age of 580    - colonoscopy every 5 years with Dr. BCristina Gongsince age [age]Prior hemorrhoid surgery 25 years ago  Functional diarrhea is the likely cause of her altered bowel habits. She does not meet the diagnostic criteria for diarrhea-predominant IBS. Reviewed the diagnosis and treatment options.   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  Fecal calprotectin 56, ESR 7, and CRP 0.1 were normal. Giardia testing was negative.  IgA tissue transglutaminase, IgA level to test for celiac disease were normal.  EGD with duodenal biopsies was normal. Colonoscopy with random biopsies was normal.  Trial of stool bulking agent recommended.   Endoscopic evaluation was performed 07/04/2018. Colonoscopy revealed sigmoid diverticulosis and a small a sending colon AVM.  Upper endoscopy revealed mild gastritis and duodenitis. Duodenal, gastric, random colon, and terminal ileal biopsies were all normal.  H. pylori testing was negative.  Having bigger, bulkier stools on benefiber. Otherwise, no change in diarrhea symptoms. Having diarrhea 1-2 days every week. Five BM yesterday. No BM today. Didn't find a food diarrhea helpful. Identified salad as a trigger.  Not taking ibuprofen. No other associated symptoms. No  identified exacerbating or relieving features.     Past Medical History:  Diagnosis Date  . Acid reflux   . Allergic rhinitis   . Asthma   . Depression   . Diarrhea 08/09/2011   Poss ibs  Vs other  Related to stress  consdier other eval if needed. Had colonoscoopy per dr BCristina Gong  . Hemorrhoids   . High cholesterol   . 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 Hypothyroidism   . Medication side effect 11/01/2011   tussionex   distrubed sleep   . Thyroid disease     Past Surgical History:  Procedure Laterality Date  . CERVICAL CONIZATION W/BX N/A 07/21/2015   Procedure: CONIZATION CERVIX WITH BIOPSY;  Surgeon: AEna Dawley MD;  [facility];  Service: Gynecology;  Laterality: N/A;  . CERVICAL CRYOTHERAPY  1984  . COLPOSCOPY  08/26/13  . DILATION AND CURETTAGE, DIAGNOSTIC / THERAPEUTIC  1994   Blighted Ovum  . HYSTEROSCOPY     uterine polypectomy Jan 7th  . HYSTEROSCOPY WITH RESECTOSCOPE  08/04/2009   Removed polyp & IUD  . LYMPH NODE BIOPSY    . TONSILLECTOMY      Current Outpatient Medications  Medication Sig Dispense Refill  . albuterol (PROAIR HFA) 108 (90 Base) MCG/ACT inhaler Inhale 2 puffs into the lungs every 6 (six) hours as needed for wheezing. 3 Inhaler 0  . aspirin EC 81 MG tablet Take 1 tablet (81 mg total) by mouth daily.    .Marland Kitchenatorvastatin (LIPITOR) 10 MG tablet TAKE 1 TABLET DAILY 90 tablet 3  . buPROPion (WELLBUTRIN XL) 150 MG 24 hr tablet Take 150 mg by mouth every morning.    .Marland Kitchen  chlorpheniramine (CHLOR-TRIMETON) 4 MG tablet Take 4 mg by mouth daily as needed for allergies.    . Cholecalciferol (VITAMIN D) 2000 units tablet Take 2,000 Units by mouth daily.    Marland Kitchen dextromethorphan-guaiFENesin (MUCINEX DM) 30-600 MG 12hr tablet Take 1 tablet by mouth 2 (two) times daily as needed for cough.    . doxylamine, Sleep, (UNISOM) 25 MG tablet Take 25 mg by mouth at bedtime as needed.    Marland Kitchen escitalopram (LEXAPRO) 20 MG tablet Take 20 mg by mouth daily.     . famotidine (PEPCID) 20 MG  tablet TAKE 1 TABLET AT BEDTIME 90 tablet 4  . fenofibrate 160 MG tablet TAKE 1 TABLET DAILY 90 tablet 3  . gabapentin (NEURONTIN) 100 MG capsule Take 1 capsule (100 mg total) by mouth daily. 270 capsule 3  . ibuprofen (ADVIL,MOTRIN) 200 MG tablet Take 400 mg by mouth every 6 (six) hours as needed for headache or mild pain.     Marland Kitchen levothyroxine (SYNTHROID, LEVOTHROID) 75 MCG tablet TAKE 1 TABLET DAILY 90 tablet 3  . lidocaine (XYLOCAINE) 5 % ointment APPLY TO AFFECTED AREA 4 TIMES DAILY AS NEEDED  1  . lithium carbonate (LITHOBID) 300 MG CR tablet TAKE 2 TABLETS DAILY 180 tablet 0  . Misc. Devices (ACAPELLA) MISC Use as directed 1 each 0  . modafinil (PROVIGIL) 200 MG tablet Take 200 mg by mouth. 1/2 po qam    . Multiple Vitamins-Minerals (MULTIVITAMIN WITH MINERALS) tablet Take 1 tablet by mouth daily.    . pantoprazole (PROTONIX) 40 MG tablet Take 1 tablet (40 mg total) by mouth daily. 90 tablet 1  . phenylephrine (SUDAFED PE) 10 MG TABS tablet Take 10 mg by mouth every 4 (four) hours as needed.    . valACYclovir (VALTREX) 500 MG tablet valacyclovir 500 mg tablet  For outbreak: Take one tablet by oral route twice a day for 3 days. For suppression: Take one tablet by oral route daily.    Merril Abbe 10 MCG TABS vaginal tablet Place 1 tablet vaginally every 14 (fourteen) days.      Current Facility-Administered Medications  Medication Dose Route Frequency Provider Last Rate Last Dose  . 0.9 %  sodium chloride infusion  500 mL Intravenous Once Thornton Park, MD        Allergies as of 07/18/2018 - Review Complete 07/04/2018  Allergen Reaction Noted  . Penicillins Hives and Shortness Of Breath   . Atorvastatin Other (See Comments) 03/28/2017  . Pravastatin Other (See Comments) 08/31/2016  . Rosuvastatin Other (See Comments) 01/09/2016    Family History  Problem Relation Age of Onset  . Heart disease Sister 68       cabg stent  . Arthritis Mother   . Heart disease Mother   . Heart  disease Father   . Colon cancer Father   . Parkinson's disease Father   . Pulmonary embolism Daughter   . Heart disease Sister   . Thyroid disease Sister   . Heart disease Brother   . Rectal cancer Neg Hx     Social History   Socioeconomic History  . Marital status: Married    Spouse name: Not on file  . Number of children: Not on file  . Years of education: Not on file  . Highest education level: Not on file  Occupational History  . Not on file  Social Needs  . Financial resource strain: Not on file  . Food insecurity:    Worry: Not on file  Inability: Not on file  . Transportation needs:    Medical: Not on file    Non-medical: Not on file  Tobacco Use  . Smoking status: Never Smoker  . Smokeless tobacco: Never Used  Substance and Sexual Activity  . Alcohol use: Yes    Alcohol/week: 0.0 standard drinks    Comment: once a month  . Drug use: No  . Sexual activity: Yes    Birth control/protection: None  Lifestyle  . Physical activity:    Days per week: Not on file    Minutes per session: Not on file  . Stress: Not on file  Relationships  . Social connections:    Talks on phone: Not on file    Gets together: Not on file    Attends religious service: Not on file    Active member of club or organization: Not on file    Attends meetings of clubs or organizations: Not on file    Relationship status: Not on file  . Intimate partner violence:    Fear of current or ex partner: Not on file    Emotionally abused: Not on file    Physically abused: Not on file    Forced sexual activity: Not on file  Other Topics Concern  . Not on file  Social History Narrative    [employer]. 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  There were no  vitals filed for this visit.  Physical Exam: Vital signs were reviewed. General:   Alert, well-nourished, pleasant and cooperative in NAD Head:  Normocephalic and atraumatic. Eyes:  Sclera clear, no icterus.   Conjunctiva pink. Mouth:  No deformity or lesions.   Neck:  Supple; no thyromegaly. Lungs:  Clear throughout to auscultation.   No wheezes.  Heart:  Regular rate and rhythm; no murmurs Abdomen:  Soft, nontender, normal bowel sounds. No rebound or guarding. No hepatosplenomegaly Rectal:  Deferred  Msk:  Symmetrical without gross deformities. Extremities:  No gross deformities or edema. Neurologic:  Alert and  oriented x4;  grossly nonfocal Skin:  No rash or bruise. Psych:  Alert and cooperative. Normal mood and affect.   Kidus Delman L. Tarri Glenn, MD, MPH [facility] 07/18/2018, 11:33 AM

## 2018-07-18 NOTE — Patient Instructions (Signed)
Avoid Nsaids.   Use Imodium 1/2 pill as needed.   We have given you a FODMAP diet.

## 2018-07-20 ENCOUNTER — Other Ambulatory Visit: Payer: Self-pay | Admitting: Internal Medicine

## 2018-07-25 LAB — HM PAP SMEAR

## 2018-07-30 NOTE — Progress Notes (Signed)
Chief Complaint  Patient presents with  . Dizziness    around late afternoon. Light headness , shakiness, almost like hungry or low blood sugar after 20 minutes after eating starts to feel better been going on for about 2 months but not every day  . Burn    on right index finger hasn't been healing incident happened 12 days ago and there has been drainage    HPI: Lisa Lee 58 y.o. come in for concern about  2 issues   Afternoon  Shaking tremor hand spells  At work  About 5 pm times   Tries a snack     Has happened  5-6 x  20 minutes  lighheaded . Usual late afternoon.  Lunch around noon.   Diet stuff  Not much caffiene .  No cp sob with this  No skipped meals but does eat lat after lunch wher  Often has sandwich. Had gi  Eval.     And was fine.   For loose stools  No new meds changes  caffiene  Neg fam hx except ? Gp may have had Parkinson  No incited anxiety related  Burned finger cooking a few weeks ago and still red a? Infection or other  No fever isg current pain  ROS: See pertinent positives and negatives per HPI. No tachy or syncope.   Past Medical History:  Diagnosis Date  . Acid reflux   . Allergic rhinitis   . Asthma   . Depression   . Diarrhea 08/09/2011   Poss ibs  Vs other  Related to stress  consdier other eval if needed. Had colonoscoopy per dr Matthias HughsBuccini   . Hemorrhoids   . High cholesterol   . HPV in female [id number]  . Hypothyroidism   . Medication side effect 11/01/2011   tussionex   distrubed sleep   . Thyroid disease     Family History  Problem Relation Age of Onset  . Heart disease Sister 457       cabg stent  . Arthritis Mother   . Heart disease Mother   . Heart disease Father   . Colon cancer Father   . Parkinson's disease Father   . Pulmonary embolism Daughter   . Heart disease Sister   . Thyroid disease Sister   . Heart disease Brother   . Rectal cancer Neg Hx     Social History   Socioeconomic History  . Marital status: Married      Spouse name: Not on file  . Number of children: Not on file  . Years of education: Not on file  . Highest education level: Not on file  Occupational History  . Not on file  Social Needs  . Financial resource strain: Not on file  . Food insecurity:    Worry: Not on file    Inability: Not on file  . Transportation needs:    Medical: Not on file    Non-medical: Not on file  Tobacco Use  . Smoking status: Never Smoker  . Smokeless tobacco: Never Used  Substance and Sexual Activity  . Alcohol use: Yes    Alcohol/week: 0.0 standard drinks    Comment: once a month  . Drug use: No  . Sexual activity: Yes    Birth control/protection: None  Lifestyle  . Physical activity:    Days per week: Not on file    Minutes per session: Not on file  . Stress: Not on file  Relationships  .  Social connections:    Talks on phone: Not on file    Gets together: Not on file    Attends religious service: Not on file    Active member of club or organization: Not on file    Attends meetings of clubs or organizations: Not on file    Relationship status: Not on file  Other Topics Concern  . Not on file  Social History Narrative    [employer]. 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 albuterol (PROAIR HFA) 108 (90 Base) MCG/ACT inhaler Inhale 2 puffs into the lungs every 6 (six) hours as needed for wheezing. 3 Inhaler 0  . aspirin EC 81 MG tablet Take 1 tablet (81 mg total) by mouth daily.    Marland Kitchen atorvastatin (LIPITOR) 10 MG tablet TAKE 1 TABLET DAILY 90 tablet 3  . chlorpheniramine (CHLOR-TRIMETON) 4 MG tablet Take 4 mg by mouth daily as needed for allergies.    . Cholecalciferol (VITAMIN D) 2000 units tablet Take 2,000 Units by mouth daily.    Marland Kitchen  dextromethorphan-guaiFENesin (MUCINEX DM) 30-600 MG 12hr tablet Take 1 tablet by mouth 2 (two) times daily as needed for cough.    . doxylamine, Sleep, (UNISOM) 25 MG tablet Take 25 mg by mouth at bedtime as needed.    Marland Kitchen escitalopram (LEXAPRO) 20 MG tablet Take 20 mg by mouth daily.     . famotidine (PEPCID) 20 MG tablet TAKE 1 TABLET AT BEDTIME 90 tablet 4  . fenofibrate 160 MG tablet TAKE 1 TABLET DAILY 90 tablet 3  . gabapentin (NEURONTIN) 100 MG capsule Take 1 capsule (100 mg total) by mouth daily. 270 capsule 3  . ibuprofen (ADVIL,MOTRIN) 200 MG tablet Take 400 mg by mouth every 6 (six) hours as needed for headache or mild pain.     Marland Kitchen levothyroxine (SYNTHROID, LEVOTHROID) 75 MCG tablet TAKE 1 TABLET DAILY 90 tablet 3  . lidocaine (XYLOCAINE) 5 % ointment APPLY TO AFFECTED AREA 4 TIMES DAILY AS NEEDED  1  . lithium carbonate (LITHOBID) 300 MG CR tablet TAKE 2 TABLETS DAILY (Patient taking differently: Take 300 mg by mouth 2 (two) times daily. ) 180 tablet 0  . loperamide (IMODIUM A-D) 2 MG tablet Take 1 mg by mouth as needed for diarrhea or loose stools.    . Misc. Devices (ACAPELLA) MISC Use as directed 1 each 0  . modafinil (PROVIGIL) 200 MG tablet Take 200 mg by mouth. 1/2 po qam    . Multiple Vitamins-Minerals (MULTIVITAMIN WITH MINERALS) tablet Take 1 tablet by mouth daily.    . pantoprazole (PROTONIX) 40 MG tablet TAKE 1 TABLET DAILY 90 tablet 4  . valACYclovir (VALTREX) 500 MG tablet valacyclovir 500 mg tablet  For outbreak: Take one tablet by oral route twice a day for 3 days. For suppression: Take one tablet by oral route daily.    . Wheat Dextrin (BENEFIBER) POWD Take 2 heaping teaspoons by mouth daily    . YUVAFEM 10 MCG TABS vaginal tablet Place 1 tablet vaginally every 14 (fourteen) days.      Facility-Administered Medications Prior to Visit  Medication Dose Route Frequency Provider Last Rate Last Dose  . 0.9 %  sodium chloride infusion  500 mL Intravenous Once Tressia Danas, MD         EXAM:  BP 130/68 (BP Location: Right Arm, Patient Position: Sitting, Cuff Size: Normal)   Pulse 65   Temp 98.4 F (36.9 C) (Oral)   Wt 132 lb 4.8 oz (60 kg)   LMP 12/09/2013   BMI 25.20 kg/m   Body mass index is 25.2 kg/m.  GENERAL: vitals reviewed and listed above, alert, oriented, appears well hydrated and in no acute distress HEENT: atraumatic, conjunctiva  clear, no obvious abnormalities on inspection of external nose and ears  NECK: no obvious masses on inspection no masses   LUNGS: clear to auscultation bilaterally, no wheezes, rales or rhonchi, good air movement CV: HRRR, no clubbing cyanosis or  peripheral edema nl cap refill  MS: moves all extremities without noticeable focal  Abnormality  right index with redness and mild swelling on tender  At base of nail  No bleeding or dc and no fluctuance  .neuro grossly nl  N? Fine physio  termor with intention no weakness or clonus PSYCH: pleasant and cooperative, no obvious depression or anxiety Lab Results  Component Value Date   WBC 8.2 08/01/2018   HGB 13.5 08/01/2018   HCT 40.0 08/01/2018   PLT 333.0 08/01/2018   GLUCOSE 132 (H) 08/01/2018   CHOL 176 08/01/2018   TRIG 135.0 08/01/2018   HDL 56.40 08/01/2018   LDLDIRECT 101.0 10/30/2016   LDLCALC 93 08/01/2018   ALT 31 08/01/2018   AST 25 08/01/2018   NA 140 08/01/2018   K 4.7 08/01/2018   CL 107 08/01/2018   CREATININE 1.00 08/01/2018   BUN 26 (H) 08/01/2018   CO2 25 08/01/2018   TSH 1.45 08/01/2018   HGBA1C 5.5 08/01/2018   BP Readings from Last 3 Encounters:  08/01/18 130/68  07/18/18 124/66  07/04/18 96/75    ASSESSMENT AND PLAN:  Discussed the following assessment and plan:  Shakiness - Plan: Basic metabolic panel, CBC with Differential/Platelet, Hepatic function panel, Hemoglobin A1c, Lipid panel, TSH, Glucose tolerance, 3 hours  Medication management - Plan: Basic metabolic panel, CBC with Differential/Platelet, Hepatic  function panel, Hemoglobin A1c, Lipid panel, TSH, Glucose tolerance, 3 hours  Hypothyroidism, unspecified type - Plan: Basic metabolic panel, CBC with Differential/Platelet, Hepatic function panel, Hemoglobin A1c, Lipid panel, TSH  FH: premature coronary heart disease - Plan: Basic metabolic panel, CBC with Differential/Platelet, Hepatic function panel, Hemoglobin A1c, Lipid panel, TSH  Hyperlipidemia, unspecified hyperlipidemia type - Plan: Basic metabolic panel, CBC with Differential/Platelet, Hepatic function panel, Hemoglobin A1c, Lipid panel, TSH  Fasting hyperglycemia - Plan: Glucose tolerance, 3 hours  Burn of finger, unspecified burn degree, unspecified laterality, initial encounter - healing  ? mild paro,ychia but doesnt seem infected and advise local gentle care no drainage indicated local car discussed  Uncertain cause bot  timing is late afternoon on work days   Only .   consdier p reactive hypoglycemia pre diabetes  Medications   Etc  . Doesn't sound cv but check pulse etc at time   Have  Afternoon snack ( not processed carbs)  And go form there .  Plan fu depndig on lab and gtt and  Sx    Expectant management. And local care for finger -Patient advised to return or notify health care team  if  new concerns arise.  Patient Instructions  Plan complex snack in afternoon .   Will order a GTT  To look for prediabetes .  Checking thyroid  And lab today  This could be an aggravation of    A baseline tremor .     Soak finger in luke warm and cover  For protection  For now.     Neta MendsWanda K. Eustacia Urbanek M.D.

## 2018-08-01 ENCOUNTER — Ambulatory Visit (INDEPENDENT_AMBULATORY_CARE_PROVIDER_SITE_OTHER): Payer: 59 | Admitting: Internal Medicine

## 2018-08-01 ENCOUNTER — Encounter: Payer: Self-pay | Admitting: Internal Medicine

## 2018-08-01 VITALS — BP 130/68 | HR 65 | Temp 98.4°F | Wt 132.3 lb

## 2018-08-01 DIAGNOSIS — E039 Hypothyroidism, unspecified: Secondary | ICD-10-CM

## 2018-08-01 DIAGNOSIS — Z79899 Other long term (current) drug therapy: Secondary | ICD-10-CM

## 2018-08-01 DIAGNOSIS — R251 Tremor, unspecified: Secondary | ICD-10-CM

## 2018-08-01 DIAGNOSIS — Z8249 Family history of ischemic heart disease and other diseases of the circulatory system: Secondary | ICD-10-CM

## 2018-08-01 DIAGNOSIS — T23029A Burn of unspecified degree of unspecified single finger (nail) except thumb, initial encounter: Secondary | ICD-10-CM

## 2018-08-01 DIAGNOSIS — E785 Hyperlipidemia, unspecified: Secondary | ICD-10-CM

## 2018-08-01 DIAGNOSIS — R7301 Impaired fasting glucose: Secondary | ICD-10-CM

## 2018-08-01 LAB — HEPATIC FUNCTION PANEL
ALBUMIN: 4.6 g/dL (ref 3.5–5.2)
ALT: 31 U/L (ref 0–35)
AST: 25 U/L (ref 0–37)
Alkaline Phosphatase: 50 U/L (ref 39–117)
Bilirubin, Direct: 0.1 mg/dL (ref 0.0–0.3)
Total Bilirubin: 0.4 mg/dL (ref 0.2–1.2)
Total Protein: 6.9 g/dL (ref 6.0–8.3)

## 2018-08-01 LAB — BASIC METABOLIC PANEL
BUN: 26 mg/dL — ABNORMAL HIGH (ref 6–23)
CO2: 25 mEq/L (ref 19–32)
Calcium: 10.3 mg/dL (ref 8.4–10.5)
Chloride: 107 mEq/L (ref 96–112)
Creatinine, Ser: 1 mg/dL (ref 0.40–1.20)
GFR: 60.37 mL/min (ref >60.00)
Glucose, Bld: 132 mg/dL — ABNORMAL HIGH (ref 70–99)
Potassium: 4.7 mEq/L (ref 3.5–5.1)
Sodium: 140 mEq/L (ref 135–145)

## 2018-08-01 LAB — CBC WITH DIFFERENTIAL/PLATELET
Basophils Absolute: 0.1 10*3/uL (ref 0.0–0.1)
Basophils Relative: 0.6 % (ref 0.0–3.0)
Eosinophils Absolute: 0.4 10*3/uL (ref 0.0–0.7)
Eosinophils Relative: 5.4 % — ABNORMAL HIGH (ref 0.0–5.0)
HEMATOCRIT: 40 % (ref 36.0–46.0)
Hemoglobin: 13.5 g/dL (ref 12.0–15.0)
LYMPHS ABS: 2.2 10*3/uL (ref 0.7–4.0)
Lymphocytes Relative: 26.2 % (ref 12.0–46.0)
MCHC: 33.8 g/dL (ref 30.0–36.0)
MCV: 92.6 fl (ref 78.0–100.0)
Monocytes Absolute: 0.6 10*3/uL (ref 0.1–1.0)
Monocytes Relative: 7.7 % (ref 3.0–12.0)
Neutro Abs: 4.9 10*3/uL (ref 1.4–7.7)
Neutrophils Relative %: 60.1 % (ref 43.0–77.0)
Platelets: 333 10*3/uL (ref 150.0–400.0)
RBC: 4.32 Mil/uL (ref 3.87–5.11)
RDW: 12.4 % (ref 11.5–15.5)
WBC: 8.2 10*3/uL (ref 4.0–10.5)

## 2018-08-01 LAB — TSH: TSH: 1.45 u[IU]/mL (ref 0.35–4.50)

## 2018-08-01 LAB — LIPID PANEL
Cholesterol: 176 mg/dL (ref 0–200)
HDL: 56.4 mg/dL (ref >39.00)
LDL Cholesterol: 93 mg/dL (ref 0–99)
NonHDL: 120.02
Total CHOL/HDL Ratio: 3
Triglycerides: 135 mg/dL (ref 0.0–149.0)
VLDL: 27 mg/dL (ref 0.0–40.0)

## 2018-08-01 LAB — HEMOGLOBIN A1C: Hgb A1c MFr Bld: 5.5 % (ref 4.6–6.5)

## 2018-08-01 NOTE — Patient Instructions (Signed)
Plan complex snack in afternoon .   Will order a GTT  To look for prediabetes .  Checking thyroid  And lab today  This could be an aggravation of    A baseline tremor .     Soak finger in luke warm and cover  For protection  For now.

## 2018-08-07 ENCOUNTER — Ambulatory Visit: Payer: BLUE CROSS/BLUE SHIELD | Admitting: Internal Medicine

## 2018-08-07 MED ORDER — DOXYCYCLINE HYCLATE 100 MG PO TABS: 100.0000 mg | ORAL_TABLET | Freq: Two times a day (BID) | ORAL | 0 refills | Status: DC

## 2018-08-07 NOTE — Telephone Encounter (Signed)
Please send in  Doxycyline( since you are allegic to pcn) 100 mg 1 po bid for 5 days disp 10  And continue to soak.    If not improving we may need to recheck in office  ( can also send in a picture after rx )

## 2018-08-13 ENCOUNTER — Encounter: Payer: Self-pay | Admitting: Internal Medicine

## 2018-08-15 NOTE — Telephone Encounter (Signed)
Send  [place] a picture   The sorry about the se of medication  In future would take with food if need to take this

## 2018-08-22 ENCOUNTER — Ambulatory Visit (INDEPENDENT_AMBULATORY_CARE_PROVIDER_SITE_OTHER): Payer: 59 | Admitting: Internal Medicine

## 2018-08-22 ENCOUNTER — Encounter: Payer: Self-pay | Admitting: Internal Medicine

## 2018-08-22 ENCOUNTER — Other Ambulatory Visit: Payer: Self-pay | Admitting: Obstetrics and Gynecology

## 2018-08-22 DIAGNOSIS — J45991 Cough variant asthma: Secondary | ICD-10-CM

## 2018-08-22 NOTE — Patient Instructions (Addendum)
Try gabapentin 100 mg every other night for a few weeks then stop completely if no cough    If you are satisfied with your treatment plan,  let your doctor know and he/she can either refill your medications or you can return here when your prescription runs out.     If in any way you are not 100% satisfied,  please tell us.  If 100% better, tell your friends!  Pulmonary follow up is as needed

## 2018-08-22 NOTE — Progress Notes (Signed)
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 Cough p ex as child but after childbirth 1991 noct cough >  Bad symptoms of cough /wheeze immediately p MCT in ? 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 Follow-up    Cough is much improved.   some throat clearing since working outside, convinced she has allergies/ better with 1st gen h1, still on dulera 100 one bid  rec Try to clear your throat - chlorpheniramine and hard rock candy will help Please see patient coordinator before you leave today  to schedule Kozlow evaluation but hold antihistamines first try off dulera to what difference if any this makes - ok to restart if worse  Call if neg work up for trial of gabapentin 100 mg three times day      05/03/2017  Extended  f/u ov/Lisa Lee re: re-establish UACS  - cough x 27 years, esp since 2010  Chief Complaint  Patient presents with  . Acute Visit    Pt states her cough has been worse for the past 3 months.    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 at the time the cough was gone Thinks maybe was Maintaining on chlortrimeton hs and arnuity  one daily / ppi and hs but not flonase  Pattern now = no noct cough but Wakes up 5 am then starts the throat clearing and dry cough  Not limited by breathing from desired activities   rec Gabapentin 100 mg three times a day  For drainage / throat tickle try take CHLORPHENIRAMINE  4 mg - take one every 4 hours as needed - available over the counter- may cause drowsiness so start with just a bedtime dose or two and see how you tolerate it before trying in daytime   No change in acid suppression or diet or asthmanex dosing for now     07/16/2017  f/u ov/Lisa Lee re:  Cough x 27 years finally gone on gabapentin 100 mg at bedtime  Chief Complaint  Patient presents with  . Follow-up    Breathing is doing well and she is no longer coughing. She has not had to use her albuterol inhaler.   wakes up feeling rested after 6-7 hours using both 1st gen H1 blockers per guidelines  And gabapentin 100 at hs No sob at all though no aerobics / cut down her asmanex on her own to  1 bid/ no need for saba  rec No change in medications except try off asmanex to see if you notice any difference and if you get worse then restart  Please schedule a follow up visit in 3 months but call sooner if needed      11/05/2017  Acute  ov/Lisa Lee re:  uacs flare p viral uri Chief Complaint  Patient presents with  . Acute Visit    Doing well has productive cough white in color Since 10/14/17, NO SOB     was doing great in terms of Dyspnea:  Not limited by breathing from desired activities  / no aerobics Cough: x  10/14/17 prior to trip to [facility] worse noct - did not add full reflux regimen as previously instructed for flare. Sleep: waking up coughing  SABA use:  None  rec For drainage / throat tickle try take CHLORPHENIRAMINE  4 mg - take one every 4 hours as needed   Ok to take extra gabapentin at bedtime until cough is gone completely  GERD diet    01/31/2018  f/u ov/Lisa Lee re: uacs Chief Complaint  Patient presents with  .  Follow-up    increased cough over the past 3 days- non prod. She rarely uses her proair.   Dyspnea:  Not limited by breathing from desired activities   Cough: dry x 3 days / flared off gabapentin 100 mg one hs  Sleeping: more cough x 3 days at hs  SABA use: rare rec Ok to increase the gabapentin to as much as 300 mg at bedtime when cough is active then wean back to 100 mg daily  Please schedule a follow up visit in 6 months but call sooner if needed    08/22/2018  f/u ov/Lisa Lee re: uacs onset in 1990sdoing great on just gabapentin 100 mg qhs Chief Complaint  Patient presents with  . Follow-up    Breathing is doing well.  She rarely uses her albuterol inhaler.  Dyspnea:  Not limited by breathing from desired activities   Cough: gone Sleeping: horizontal p gabapentin 100 at hs  SABA use: rare  02: gone    No obvious day to day or daytime variability or assoc excess/ purulent sputum or mucus plugs or hemoptysis or cp or chest tightness, subjective wheeze or overt sinus or hb symptoms.   Sleeping fine now  without nocturnal  or early am exacerbation  of respiratory  c/o's or need for noct saba. Also denies any obvious fluctuation of symptoms with weather or environmental changes or other aggravating or alleviating factors except as outlined above   No unusual exposure hx or h/o childhood pna/ asthma or knowledge of premature birth.  Current Allergies, Complete Past Medical History, Past Surgical History, Family History, and Social History were reviewed in [employer] record.  ROS  The following are not active complaints unless bolded Hoarseness, sore throat, dysphagia, dental problems, itching, sneezing,  nasal congestion or discharge of excess mucus or purulent secretions, ear ache,   fever, chills, sweats, unintended wt loss or wt gain, classically pleuritic or exertional cp,  orthopnea pnd or arm/hand swelling  or leg swelling, presyncope, palpitations, abdominal  pain, anorexia, nausea, vomiting, diarrhea  or change in bowel habits or change in bladder habits, change in stools or change in urine, dysuria, hematuria,  rash, arthralgias, visual complaints, headache, numbness, weakness or ataxia or problems with walking or coordination,  change in mood or  memory.  Current Meds  Medication Sig  . albuterol (PROAIR HFA) 108 (90 Base) MCG/ACT inhaler Inhale 2 puffs into the lungs every 6 (six) hours as needed for wheezing.  Marland Kitchen. aspirin EC 81 MG tablet Take 1 tablet (81 mg total) by mouth daily.  Marland Kitchen. atorvastatin (LIPITOR) 10 MG tablet TAKE 1 TABLET DAILY  . chlorpheniramine (CHLOR-TRIMETON) 4 MG tablet Take 4 mg by mouth daily as needed for allergies.  . Cholecalciferol (VITAMIN D) 2000 units tablet Take 2,000 Units by mouth daily.  Marland Kitchen. dextromethorphan-guaiFENesin (MUCINEX DM) 30-600 MG 12hr tablet Take 1 tablet by mouth 2 (two) times daily as needed for cough.  . doxylamine, Sleep, (UNISOM) 25 MG tablet Take 25 mg by mouth at bedtime as needed.  Marland Kitchen. escitalopram (LEXAPRO) 20 MG tablet Take 20 mg by mouth daily.   . famotidine (PEPCID) 20 MG tablet TAKE 1 TABLET AT BEDTIME  . fenofibrate 160 MG tablet TAKE 1 TABLET DAILY  . gabapentin (NEURONTIN) 100 MG capsule Take 1 capsule (100 mg total) by mouth daily.  Marland Kitchen. ibuprofen (ADVIL,MOTRIN) 200 MG tablet Take 400 mg by mouth every 6 (six) hours as needed for headache or mild pain.   Marland Kitchen. levothyroxine (SYNTHROID, LEVOTHROID) 75 MCG tablet TAKE 1 TABLET DAILY  . lidocaine (XYLOCAINE) 5 % ointment APPLY TO AFFECTED AREA 4 TIMES DAILY AS NEEDED  . lithium carbonate (LITHOBID) 300 MG CR tablet TAKE 2 TABLETS DAILY (Patient taking differently: Take 300 mg by mouth 2 (two) times daily. )  . loperamide (IMODIUM A-D) 2 MG tablet Take 1 mg by mouth as needed for diarrhea or loose stools.  . Misc. Devices (ACAPELLA) MISC Use as directed  . modafinil (PROVIGIL) 200 MG tablet Take 200 mg by mouth. 1/2 po qam  . Multiple  Vitamins-Minerals (MULTIVITAMIN WITH MINERALS) tablet Take 1 tablet by mouth daily.  . pantoprazole (PROTONIX) 40 MG tablet TAKE 1 TABLET DAILY  . valACYclovir (VALTREX) 500 MG tablet valacyclovir 500 mg tablet  For outbreak: Take one tablet by oral route twice a day for 3 days. For suppression: Take one tablet by oral route daily.  . Wheat Dextrin (BENEFIBER) POWD Take 2 heaping teaspoons by mouth daily  . YUVAFEM 10 MCG TABS vaginal tablet Place 1 tablet vaginally every 14 (fourteen) days.                             Objective:   Physical Exam   amb wf nad  - all smiles   08/22/2018         135 01/31/2018          131  11/05/2017          137 07/16/2017      140  09/24/2016        134  08/13/2016        136  07/19/2016      135 06/07/2016        133 05/16/16 134 lb (60.8 kg)  04/16/16 135 lb 3.2 oz (61.3 kg)  03/27/16 134 lb (60.8 kg)    Vital signs reviewed - Note on arrival 02 sats  97% on RA    HEENT: nl dentition, turbinates bilaterally, and oropharynx. Nl external ear canals without cough reflex   NECK :  without JVD/Nodes/TM/ nl carotid upstrokes bilaterally   LUNGS: no acc muscle use,  Nl contour chest which is clear to A and P bilaterally without cough on insp or exp maneuvers  CV:  RRR  no s3 or murmur or increase in P2, and no edema   ABD:  soft and nontender with nl inspiratory excursion in the supine position. No bruits or organomegaly appreciated, bowel sounds nl  MS:  Nl gait/ ext warm without deformities, calf tenderness, cyanosis or clubbing No obvious joint restrictions   SKIN: warm and dry without lesions    NEURO:  alert, approp, nl sensorium with  no motor or cerebellar deficits apparent.             Assessment:

## 2018-08-24 ENCOUNTER — Encounter: Payer: Self-pay | Admitting: Internal Medicine

## 2018-08-24 NOTE — Assessment & Plan Note (Signed)
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 05/16/2016  rec Trial off breo and on dulera 100 2bid - FENO 05/24/2016  =   30 on dulera 100 2bid despite poor hfa - Allergy profile 05/24/2016 >  Eos 0.4 /  IgE 34 Neg RAST   - Repeat MCT  08/21/16 > neg > rec 09/24/2016 try off dulera  - allergy eval 10/31/16 Dr Lucie Leather > c/w LPR - reported better on arnuity but still throat clearing 05/03/2017 > changed to asmanex 100 2bid  - 05/03/2017  After extensive coaching HFA effectiveness =    90% - 07/16/2017 try off asmanex as already had tapered down to one bid s flare > no flare as of 11/05/2017  - 01/31/2018 reported flare off gabapentin > rec 100 mg x 2 hs until better then maint on 100mg  daily    - 08/22/2018 ok to try again taper off gabapentin starting with qod dosing   No evidence of activeasthma here (off all asthma rx x months)  and cough well controlled on min dosing with gabapentin so ok to try taper off and pulmonary f/u prn.  I had an extended final summary  discussion with the patient reviewing all relevant studies completed to date and  lasting 10 minutes of a 15 minute visit    Each maintenance medication was reviewed in detail including most importantly the difference between maintenance and prns and under what circumstances the prns are to be triggered using an action plan format that is not reflected in the computer generated alphabetically organized AVS.     Please see AVS for specific instructions unique to this visit that I personally wrote and verbalized to the the pt in detail and then reviewed with pt  by my nurse highlighting any  changes in therapy recommended at today's visit to their plan of care.

## 2018-08-29 ENCOUNTER — Ambulatory Visit: Payer: 59 | Admitting: Physician Assistant

## 2018-08-29 ENCOUNTER — Encounter: Payer: Self-pay | Admitting: Physician Assistant

## 2018-08-29 DIAGNOSIS — F319 Bipolar disorder, unspecified: Secondary | ICD-10-CM

## 2018-08-29 DIAGNOSIS — G4700 Insomnia, unspecified: Secondary | ICD-10-CM

## 2018-08-29 MED ORDER — MODAFINIL 200 MG PO TABS: 100.0000 mg | ORAL_TABLET | Freq: Every day | ORAL | 5 refills | Status: DC

## 2018-08-29 MED ORDER — ESCITALOPRAM OXALATE 20 MG PO TABS: 20.0000 mg | ORAL_TABLET | Freq: Every day | ORAL | 1 refills | Status: DC

## 2018-08-29 MED ORDER — LITHIUM CARBONATE ER 300 MG PO TBCR: 300.0000 mg | EXTENDED_RELEASE_TABLET | Freq: Two times a day (BID) | ORAL | 1 refills | Status: DC

## 2018-08-29 NOTE — Progress Notes (Signed)
[facility] Med Check  Patient ID: SKYLYR RYGH,  MRN: [id number]  PCP: Madelin Headings, MD  Date of Evaluation: 08/29/2018 Time spent:15 minutes  Chief Complaint:  Chief Complaint    Follow-up      HISTORY/CURRENT STATUS: HPI Here for 6 month med check.  Not sure if Provigil is working much.  Only takes 50mg .  Lacks energy but still motivated.  Work is going well.  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  Patient denies loss of interest in usual activities and is able to enjoy things.  Denies decreased energy or motivation.  Appetite has not changed.  No extreme sadness, tearfulness, or feelings of hopelessness.  Denies any changes in concentration, making decisions or remembering things.  Denies suicidal or homicidal thoughts.  Patient denies increased energy with decreased need for sleep, no increased talkativeness, no racing thoughts, no impulsivity or risky behaviors, no increased spending, no increased libido, no grandiosity.    Individual Medical History/ Review of Systems: Changes? :No    Past medications for mental health diagnoses include: Lexapro, lithium  Allergies: Penicillins; Atorvastatin; Pravastatin; and Rosuvastatin  Current Medications:  Current Outpatient Medications:  .  aspirin EC 81 MG tablet, Take 1 tablet (81 mg total) by mouth daily., Disp: , Rfl:  .  atorvastatin (LIPITOR) 10 MG tablet, TAKE 1 TABLET DAILY, Disp: 90 tablet, Rfl: 3 .  Cholecalciferol (VITAMIN D) 2000 units tablet, Take 2,000 Units by mouth daily., Disp: , Rfl:  .  dextromethorphan-guaiFENesin (MUCINEX DM) 30-600 MG 12hr tablet, Take 1 tablet by mouth 2 (two) times daily as needed for cough., Disp: , Rfl:  .  doxylamine, Sleep, (UNISOM) 25 MG tablet, Take 25 mg by mouth at bedtime as needed., Disp: , Rfl:  .  escitalopram (LEXAPRO) 20 MG tablet, Take 1 tablet (20 mg total) by mouth daily., Disp: 90 tablet, Rfl: 1 .  famotidine (PEPCID) 20 MG tablet, TAKE  1 TABLET AT BEDTIME, Disp: 90 tablet, Rfl: 4 .  fenofibrate 160 MG tablet, TAKE 1 TABLET DAILY, Disp: 90 tablet, Rfl: 3 .  gabapentin (NEURONTIN) 100 MG capsule, Take 1 capsule (100 mg total) by mouth daily. (Patient taking differently: Take 100 mg by mouth daily. Qod.), Disp: 270 capsule, Rfl: 3 .  ibuprofen (ADVIL,MOTRIN) 200 MG tablet, Take 400 mg by mouth every 6 (six) hours as needed for headache or mild pain. , Disp: , Rfl:  .  levothyroxine (SYNTHROID, LEVOTHROID) 75 MCG tablet, TAKE 1 TABLET DAILY, Disp: 90 tablet, Rfl: 3 .  lithium carbonate (LITHOBID) 300 MG CR tablet, Take 1 tablet (300 mg total) by mouth 2 (two) times daily., Disp: 180 tablet, Rfl: 1 .  loperamide (IMODIUM A-D) 2 MG tablet, Take 1 mg by mouth as needed for diarrhea or loose stools., Disp: , Rfl:  .  modafinil (PROVIGIL) 200 MG tablet, Take 0.5 tablets (100 mg total) by mouth daily., Disp: 30 tablet, Rfl: 5 .  Multiple Vitamins-Minerals (MULTIVITAMIN WITH MINERALS) tablet, Take 1 tablet by mouth daily., Disp: , Rfl:  .  pantoprazole (PROTONIX) 40 MG tablet, TAKE 1 TABLET DAILY, Disp: 90 tablet, Rfl: 4 .  valACYclovir (VALTREX) 500 MG tablet, valacyclovir 500 mg tablet  For outbreak: Take one tablet by oral route twice a day for 3 days. For suppression: Take one tablet by oral route daily., Disp: , Rfl:  .  Wheat Dextrin (BENEFIBER) POWD, Take 2 heaping teaspoons by mouth daily, Disp: , Rfl:  .  YUVAFEM 10 MCG TABS vaginal tablet, Place 1 tablet vaginally every 14 (fourteen) days. , Disp: , Rfl:  .  albuterol (PROAIR HFA) 108 (90 Base) MCG/ACT inhaler, Inhale 2 puffs into the lungs every 6 (six) hours as needed for wheezing. (Patient not taking: Reported on 08/29/2018), Disp: 3 Inhaler, Rfl: 0 .  chlorpheniramine (CHLOR-TRIMETON) 4 MG tablet, Take 4 mg by mouth daily as needed for allergies., Disp: , Rfl:  .  lidocaine (XYLOCAINE) 5 % ointment, APPLY TO AFFECTED AREA 4 TIMES DAILY AS NEEDED, Disp: , Rfl: 1 .  Misc. Devices  (ACAPELLA) MISC, Use as directed, Disp: 1 each, Rfl: 0  Current Facility-Administered Medications:  .  0.9 %  sodium chloride infusion, 500 mL, Intravenous, Once, Tressia Danas, MD Medication Side Effects: none  Family Medical/ Social History: Changes? No  MENTAL HEALTH EXAM:  Last menstrual period 12/09/2013.There is no height or weight on file to calculate BMI.  General Appearance: Casual and Well Groomed  Eye Contact:  Good  Speech:  Clear and Coherent  Volume:  Normal  Mood:  Euthymic  Affect:  Appropriate  Thought Process:  Goal Directed  Orientation:  Full (Time, Place, and Person)  Thought Content: Logical   Suicidal Thoughts:  No  Homicidal Thoughts:  No  Memory:  WNL  Judgement:  Good  Insight:  Good  Psychomotor Activity:  Normal  Concentration:  Concentration: Good  Recall:  Good  Fund of Knowledge: Good  Language: Good  Assets:  Desire for Improvement  ADL's:  Intact  Cognition: WNL  Prognosis:  Good  06/13/18 Lithium 0.8, 08/05/18 TSH 1.45, BMP Glu 132 not fasting, BUN 26, Cr 1.0  DIAGNOSES:    ICD-10-CM   1. Bipolar I disorder (HCC) F31.9   2. Insomnia, unspecified type G47.00     Receiving Psychotherapy: No     RECOMMENDATIONS: Increase the Provigil to 100mg .  Continue Lexapro 20 mg p.o. daily. Continue Lithobid 300 mg twice daily. Continue Unisom as needed. Sleep hygiene was discussed. Return in 6 months or sooner as needed.    Melony Overly, PA-C

## 2018-09-01 ENCOUNTER — Telehealth: Payer: Self-pay | Admitting: *Deleted

## 2018-09-01 NOTE — Telephone Encounter (Signed)
Lori from [facility] called and scheduled a new patient appt. Lawson Fiscal will get in touch with the patient.

## 2018-09-09 ENCOUNTER — Ambulatory Visit: Payer: 59 | Admitting: Gynecology

## 2018-09-12 ENCOUNTER — Inpatient Hospital Stay: Payer: 59 | Attending: Gynecology | Admitting: Gynecologic Oncology

## 2018-09-12 ENCOUNTER — Encounter: Payer: Self-pay | Admitting: Gynecologic Oncology

## 2018-09-12 VITALS — BP 121/67 | HR 63 | Temp 97.8°F | Resp 20 | Ht 61.5 in

## 2018-09-12 DIAGNOSIS — R87610 Atypical squamous cells of undetermined significance on cytologic smear of cervix (ASC-US): Secondary | ICD-10-CM | POA: Diagnosis not present

## 2018-09-12 DIAGNOSIS — R87810 Cervical high risk human papillomavirus (HPV) DNA test positive: Secondary | ICD-10-CM | POA: Diagnosis not present

## 2018-09-12 DIAGNOSIS — R87611 Atypical squamous cells cannot exclude high grade squamous intraepithelial lesion on cytologic smear of cervix (ASC-H): Secondary | ICD-10-CM | POA: Insufficient documentation

## 2018-09-12 DIAGNOSIS — G8918 Other acute postprocedural pain: Secondary | ICD-10-CM

## 2018-09-12 MED ORDER — IBUPROFEN 800 MG PO TABS: 800.0000 mg | ORAL_TABLET | Freq: Three times a day (TID) | ORAL | 1 refills | Status: DC | PRN

## 2018-09-12 NOTE — Patient Instructions (Signed)
Plan to have a cold knife conization of the cervix at the [facility] on September 25, 2018.  You will receive a phone call from the pre-surgical RN to discuss instructions.  Please call for any questions or concerns.  Cervical Conization  Cervical conization (cone biopsy) is a procedure in which a cone-shaped portion of the cervix is cut out so that it can be examined under a microscope. The procedure is done to check for cancer cells or cells that might turn into cancer (precancerous cells). You may have this procedure if:  You have abnormal bleeding from your cervix.  You had an abnormal Pap test.  Something abnormal was seen on your cervix during an exam. This procedure is performed in either a health care provider's office or in an operating room. Tell a health care provider about:  Any allergies you have.  All medicines you are taking, including vitamins, herbs, eye drops, creams, and over-the-counter medicines.  Any problems you or family members have had with the use of anesthetic medicines.  Any blood disorders you have.  Any surgeries you have had.  Any medical conditions you have.  Your smoking habits.  When you normally have your period.  Whether you are pregnant or may be pregnant. What are the risks? Generally, this is a safe procedure. However, problems may occur, including:  Heavy bleeding for several days or weeks after the procedure.  Allergic reactions to medicines or dyes.  Increased risk of preterm labor in future pregnancies.  Infection (rare).  Damage to the cervix or other structures or organs (rare). What happens before the procedure? Staying hydrated Follow instructions from your health care provider about hydration  Eating and drinking restrictions Follow instructions from your health care provider about eating and drinking  General instructions  Do not douche, have sex, use tampons, or use any vaginal medicines  before the procedure as told by your health care provider.  You may be asked to empty your bladder and bowel right before the procedure.  Ask your health care provider about: ? Changing or stopping your normal medicines. This is important if you take diabetes medicines or blood thinners. ? Taking medicines such as aspirin and ibuprofen. These medicines can thin your blood. Do not take these medicines before your procedure if your doctor tells you not to.  Plan to have someone take you home from the hospital or clinic. What happens during the procedure?  To reduce your risk of infection: ? Your health care team will wash or sanitize their hands. ? Your skin will be washed with soap. ? Hair may be removed from the surgical area.  You will undress from the waist down and be given a gown to wear.  You will lie on an examining table and put your feet in stirrups.  An IV tube will be inserted into one of your veins.  You will be given one or more of the following: ? A medicine to help you relax (sedative). ? A medicine to numb the area (local anesthetic). ? A medicine to make you fall asleep (general anesthetic). ? A medicine that numbs the cervix (cervical block).  A lubricated device called a speculum will be inserted into your vagina. It will be used to spread open the walls of the vagina so your health care provider can see the inside of the vagina and cervix better.  An instrument that has a magnifying lens and a light (colposcope) will let your health  care provider examine the cervix more closely.  Your health care provider will apply a solution to your cervix. This turns abnormal areas a pale color.  A tissue sample will be removed from the cervix using one of the following methods: ? The cold knife method. In this method, the tissue is cut out with a knife (scalpel). ? The loop electrosurgical excision procedure (LEEP) method. In this method, the tissue is cut out with a thin  wire that can burn (cauterize) the tissue with an electrical current. ? Laser treatment method. In this method, the tissue is cut out and then cauterized with a laser beam to prevent bleeding.  Your health care provider will apply a paste over the biopsy areas to help control bleeding.  The tissue sample will be examined under a microscope. The procedure may vary among health care providers and hospitals. What happens after the procedure?  Your blood pressure, heart rate, breathing rate, and blood oxygen level will be monitored often until the medicines you were given have worn off.  If you were given a local anesthetic, you will rest at the clinic or hospital until you are stable and feel ready to go home.  If you were given a general anesthetic, you may be monitored for a longer period of time.  You may have some cramping.  You may have bloody discharge or light to moderate bleeding.  You may have dark discharge coming from your vagina. This is from the paste used on the cervix to prevent bleeding. Summary  Cervical conization is a procedure in which a cone-shaped portion of the cervix is cut out so that it can be examined under a microscope.  The procedure is done to check for cancer cells or cells that might turn into cancer (precancerous cells). This information is not intended to replace advice given to you by your health care provider. Make sure you discuss any questions you have with your health care provider. Document Released: 04/25/2005 Document Revised: 07/18/2016 Document Reviewed: 07/18/2016 Elsevier Interactive Patient Education  2019 [employer].

## 2018-09-12 NOTE — Progress Notes (Signed)
Consult Note: Gyn-Onc  Consult was requested by Dr. Stefano Gaul for the evaluation of Lisa Lee 58 y.o. female  CC:  Chief Complaint  Patient presents with  . Abnormal Pap Smear    ASC-US, high risk HPV present    Assessment/Plan:  Lisa Lee  is a 58 y.o.  year old for an abnormal pap and positive high risk HPV.  On examination the cervix is grossly normal but small.  I think a conization for diagnostic purposes is reasonable.   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 Stefano Gaul.  Given the small size of her cervix, I think she would be best served with a conization procedure which can tailor the size of the excision to the cervical anatomy.  We discussed cone biopsies, their risks, the anticipated recovery.  She has had a current procedure performed feels comfortable proceeding.  I discussed that after the conization she may require additional procedures depending upon the final pathology of that specimen.  We had a lengthy discussion about the natural history of HPV.  I discussed that hysterectomy would not eliminate HPV infection, and she would continue to require at least 20 additional years of surveillance or longer if she continued to have persistent HPV infection, which she likely well.  I discussed that vaginal cancer is still possible after hysterectomy with the presence of HPV and history of dysplasia.  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  However certainly hysterectomy is associated with a reduction in future episodes of high-grade dysplastic events including in the vagina.  I discussed that surveillance of vaginal dysplasia can be more technically difficult compared with cervical dysplasia given the fact  that the vagina contains irrigations which make complete and thorough evaluation challenging.  Therefore I am not recommending hysterectomy at this time.  I did discuss however that sometimes hysterectomy is necessary for further diagnostic work-up of high-grade dysplastic Pap smears in patients who have had multiple prior excisional procedures and have minimal residual cervix for additional excisional procedures.   HPI: Ms Lisa Lee is a 58 year old parous woman who is seen in consultation at the request of Dr. 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  The patient's cervical dysplasia history began in 2015 when a Pap smear revealed low-grade squamous lesion encompassing mild CIN-1 with a few cells but suspicious for high-grade lesion.  This was followed up by a conization in 2016 which showed only benign findings.  A Pap smear in 2017 showed ASCUS with high-risk HPV present.  Colposcopy and biopsy showed CIN-1.  Her Pap smear in 2018 showed ASCUS with high risk HPV, colposcopy with biopsy showed benign findings.  Most recently her Pap in December 2019 showed ASC- H with HPV high risk again positive.  Colposcopy and biopsies were performed however colposcopy was unsatisfactory because the transfer transitional zone could not be completely seen.  Biopsies from this showed a negative ECC and a negative cervical biopsy.  Pap smear taken at that same time showed ASCUS.  Current Meds:  Outpatient Encounter Medications as of 09/12/2018  Medication Sig  . albuterol (PROAIR HFA) 108 (90 Base) MCG/ACT inhaler Inhale 2 puffs into the lungs every 6 (six) hours as needed for wheezing.  Marland Kitchen  aspirin EC 81 MG tablet Take 1 tablet (81 mg total) by mouth daily.  Marland Kitchen atorvastatin (LIPITOR) 10 MG tablet TAKE 1 TABLET DAILY  . Cholecalciferol (VITAMIN D) 2000 units tablet Take 2,000 Units by mouth daily.  Marland Kitchen dextromethorphan-guaiFENesin (MUCINEX  DM) 30-600 MG 12hr tablet Take 1 tablet by mouth 2 (two) times daily as needed for cough.  . doxylamine, Sleep, (UNISOM) 25 MG tablet Take 25 mg by mouth at bedtime as needed.  Marland Kitchen escitalopram (LEXAPRO) 20 MG tablet Take 1 tablet (20 mg total) by mouth daily.  . famotidine (PEPCID) 20 MG tablet TAKE 1 TABLET AT BEDTIME  . fenofibrate 160 MG tablet TAKE 1 TABLET DAILY  . gabapentin (NEURONTIN) 100 MG capsule Take 1 capsule (100 mg total) by mouth daily. (Patient taking differently: Take 100 mg by mouth daily. Patient states that she currently takes every other day for 4wks, and then she is weaning off this medication.)  . levothyroxine (SYNTHROID, LEVOTHROID) 75 MCG tablet TAKE 1 TABLET DAILY  . lithium carbonate (LITHOBID) 300 MG CR tablet Take 1 tablet (300 mg total) by mouth 2 (two) times daily.  Marland Kitchen loperamide (IMODIUM A-D) 2 MG tablet Take 1 mg by mouth daily.   . modafinil (PROVIGIL) 200 MG tablet Take 0.5 tablets (100 mg total) by mouth daily.  . Multiple Vitamins-Minerals (MULTIVITAMIN WITH MINERALS) tablet Take 1 tablet by mouth daily.  . pantoprazole (PROTONIX) 40 MG tablet TAKE 1 TABLET DAILY  . valACYclovir (VALTREX) 500 MG tablet valacyclovir 500 mg tablet  For outbreak: Take one tablet by oral route twice a day for 3 days. For suppression: Take one tablet by oral route daily.  . Wheat Dextrin (BENEFIBER) POWD Take 2 heaping teaspoons by mouth daily  . YUVAFEM 10 MCG TABS vaginal tablet Place 1 tablet vaginally 2 (two) times a week.   . [DISCONTINUED] ibuprofen (ADVIL,MOTRIN) 200 MG tablet Take 400 mg by mouth every 6 (six) hours as needed for headache or mild pain.   . chlorpheniramine (CHLOR-TRIMETON) 4 MG tablet Take 4 mg by mouth daily as needed for allergies.  Marland Kitchen ibuprofen (ADVIL,MOTRIN) 800 MG tablet Take 1 tablet (800 mg total) by mouth every 8 (eight) hours as needed for moderate pain. For AFTER surgery  . [DISCONTINUED] lidocaine (XYLOCAINE) 5 % ointment APPLY TO AFFECTED AREA 4  TIMES DAILY AS NEEDED  . [DISCONTINUED] Misc. Devices (ACAPELLA) MISC Use as directed (Patient not taking: Reported on 09/12/2018)   Facility-Administered Encounter Medications as of 09/12/2018  Medication  . 0.9 %  sodium chloride infusion    Allergy:  Allergies  Allergen Reactions  . Penicillins Hives and Shortness Of Breath    Has patient had a PCN reaction causing immediate rash, facial/tongue/throat swelling, SOB or lightheadedness with hypotension: Yes Has patient had a PCN reaction causing severe rash involving mucus membranes or skin necrosis: No Has patient had a PCN reaction that required hospitalization No Has patient had a PCN reaction occurring within the last 10 years: No If all of the above answers are "NO", then may proceed with Cephalosporin use.   . Atorvastatin Other (See Comments)    Causes lower extremity muscle aches  . Pravastatin Other (See Comments)    Cause muscle aches and cramps  . Rosuvastatin Other (See Comments)    Caused pain in hands, shoulders, back, and indigestion    Social Hx:   Social History   Socioeconomic History  . Marital status: Married    Spouse name: Not on file  .  Number of children: Not on file  . Years of education: Not on file  . Highest education level: Not on file  Occupational History  . Not on file  Social Needs  . Financial resource strain: Not on file  . Food insecurity:    Worry: Not on file    Inability: Not on file  . Transportation needs:    Medical: Not on file    Non-medical: Not on file  Tobacco Use  . Smoking status: Never Smoker  . Smokeless tobacco: Never Used  Substance and Sexual Activity  . Alcohol use: Yes    Alcohol/week: 0.0 standard drinks    Comment: rare  . Drug use: No  . Sexual activity: Yes    Birth control/protection: None  Lifestyle  . Physical activity:    Days per week: Not on file    Minutes per session: Not on file  . Stress: Not on file  Relationships  . Social connections:     Talks on phone: Not on file    Gets together: Not on file    Attends religious service: Not on file    Active member of club or organization: Not on file    Attends meetings of clubs or organizations: Not on file    Relationship status: Not on file  . Intimate partner violence:    Fear of current or ex partner: Not on file    Emotionally abused: Not on file    Physically abused: Not on file    Forced sexual activity: Not on file  Other Topics Concern  . Not on file  Social History Narrative    And [employer]. 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 CERVICAL CONIZATION W/BX N/A 07/21/2015   Procedure: CONIZATION CERVIX WITH BIOPSY;  Surgeon: Kirkland HunArthur Stringer, MD;  Location: [facility];  Service: Gynecology;  Laterality: N/A;  . CERVICAL CRYOTHERAPY  1984  . COLPOSCOPY  08/26/13  . DILATION AND CURETTAGE, DIAGNOSTIC / THERAPEUTIC  1994   Blighted Ovum  . HYSTEROSCOPY     uterine polypectomy Jan 7th  . HYSTEROSCOPY WITH RESECTOSCOPE  08/04/2009   Removed polyp & IUD  . LYMPH NODE BIOPSY    . TONSILLECTOMY      Past Medical Hx:  Past Medical History:  Diagnosis Date  . Acid reflux   . Allergic rhinitis   . Asthma   . Depression   . Diarrhea 08/09/2011   Poss ibs  Vs other  Related to stress  consdier other eval if needed. Had colonoscoopy per dr Matthias HughsBuccini   . Hemorrhoids   . High cholesterol   . 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 Hypothyroidism   . Medication side effect 11/01/2011   tussionex   distrubed sleep   . Thyroid disease     Past Gynecological History:  P2 (vaginal deliveries). Abnormal paps and HPV since 2015. Patient's last menstrual period was 12/09/2013.  Family Hx:  Family History  Problem Relation Age of Onset  . Heart disease Sister [id number]        cabg stent  . Arthritis Mother   . Heart disease Mother   . Heart disease Father   .  Colon cancer Father   . Parkinson's disease Father   . Pulmonary embolism Daughter   . Heart disease Sister   . Thyroid disease Sister   . Heart disease Brother   . Thyroid disease Daughter   . Rectal cancer Neg Hx     Review of Systems:  Constitutional  Feels well,    ENT Normal appearing ears and nares bilaterally Skin/Breast  No rash, sores, jaundice, itching, dryness Cardiovascular  No chest pain, shortness of breath, or edema  Pulmonary  No cough or wheeze.  Gastro Intestinal  No nausea, vomitting, or diarrhoea. No bright red blood per rectum, no abdominal pain, change in bowel movement, or constipation.  Genito Urinary  No frequency, urgency, dysuria, no bleeding Musculo Skeletal  No myalgia, arthralgia, joint swelling or pain  Neurologic  No weakness, numbness, change in gait,  Psychology  No depression, anxiety, insomnia.   Vitals:  Blood pressure 121/67, pulse 63, temperature 97.8 F (36.6 C), temperature source Oral, resp. rate 20, height 5' 1.5" (1.562 m), last menstrual period 12/09/2013, SpO2 100 %.  Physical Exam: WD in NAD Neck  Supple NROM, without any enlargements.  Lymph Node Survey No cervical supraclavicular or inguinal adenopathy Cardiovascular  Pulse normal rate, regularity and rhythm. S1 and S2 normal.  Lungs  Clear to auscultation bilateraly, without wheezes/crackles/rhonchi. Good air movement.  Skin  No rash/lesions/breakdown  Psychiatry  Alert and oriented to person, place, and time  Abdomen  Normoactive bowel sounds, abdomen soft, non-tender and nonobese without evidence of hernia.  Back No CVA tenderness Genito Urinary  Vulva/vagina: Normal external female genitalia.  No lesions. No discharge or bleeding.  Bladder/urethra:  No lesions or masses, well supported bladder  Vagina: grossly normal  Cervix: Normal appearing, small, posteriorally  flush with vagina, no lesions.  Uterus:  Small, mobile, no parametrial involvement or nodularity.  Adnexa: no palpable masses. Rectal  deferred Extremities  No bilateral cyanosis, clubbing or edema.   Luisa Dago, MD  09/12/2018, 5:58 PM

## 2018-09-16 ENCOUNTER — Encounter: Payer: Self-pay | Admitting: Internal Medicine

## 2018-09-18 ENCOUNTER — Encounter: Payer: Self-pay | Admitting: Internal Medicine

## 2018-09-18 ENCOUNTER — Ambulatory Visit (INDEPENDENT_AMBULATORY_CARE_PROVIDER_SITE_OTHER): Payer: 59 | Admitting: Internal Medicine

## 2018-09-18 VITALS — BP 104/60 | HR 85 | Temp 99.7°F | Wt 138.0 lb

## 2018-09-18 DIAGNOSIS — J101 Influenza due to other identified influenza virus with other respiratory manifestations: Secondary | ICD-10-CM | POA: Diagnosis not present

## 2018-09-18 DIAGNOSIS — J029 Acute pharyngitis, unspecified: Secondary | ICD-10-CM | POA: Diagnosis not present

## 2018-09-18 DIAGNOSIS — R6889 Other general symptoms and signs: Secondary | ICD-10-CM

## 2018-09-18 DIAGNOSIS — Z20828 Contact with and (suspected) exposure to other viral communicable diseases: Secondary | ICD-10-CM

## 2018-09-18 DIAGNOSIS — Z20818 Contact with and (suspected) exposure to other bacterial communicable diseases: Secondary | ICD-10-CM | POA: Diagnosis not present

## 2018-09-18 LAB — POCT INFLUENZA A/B
Influenza A, POC: POSITIVE — AB
Influenza B, POC: NEGATIVE

## 2018-09-18 LAB — POCT RAPID STREP A (OFFICE): Rapid Strep A Screen: NEGATIVE

## 2018-09-18 MED ORDER — HYDROCODONE-HOMATROPINE 5-1.5 MG/5ML PO SYRP: 5.0000 mL | ORAL_SOLUTION | Freq: Three times a day (TID) | ORAL | 0 refills | Status: DC | PRN

## 2018-09-18 MED ORDER — OSELTAMIVIR PHOSPHATE 75 MG PO CAPS: 75.0000 mg | ORAL_CAPSULE | Freq: Two times a day (BID) | ORAL | 0 refills | Status: DC

## 2018-09-18 NOTE — Patient Instructions (Addendum)
tamiflu asap Checking  for strep . \supportive care  Can add you inhalers if getting wheezing .   No work for 24 hours without fever.  And feeling better

## 2018-09-18 NOTE — Progress Notes (Signed)
Chief Complaint  Patient presents with  . Cough    Pt states couhg started 2 days ago and fever started last night. Pt has bodyaches,chills,headache,and nasal congestion. Took OTC delysum and mortin     HPI: Lisa Lee 58 y.o. come in for above  Cinet child has had  Adenovirus and strep   Exposed.  To the mom in the household who had infuenza a also   Cough x 36 hours  Fever last night  102 range  .  Cough no sig wheezing  No on the inhalers at this time.  ROS: See pertinent positives and negatives per HPI. No vomiting sob  Lots of upper congestion and some sore throat  Had flu vaccine  Past Medical History:  Diagnosis Date  . Acid reflux   . Allergic rhinitis   . Asthma   . Depression   . Diarrhea 08/09/2011   Poss ibs  Vs other  Related to stress  consdier other eval if needed. Had colonoscoopy per dr Matthias Hughs   . Hemorrhoids   . High cholesterol   . HPV in female 60  . Hypothyroidism   . Medication side effect 11/01/2011   tussionex   distrubed sleep   . Thyroid disease     Family History  Problem Relation Age of Onset  . Heart disease Sister 53       cabg stent  . Arthritis Mother   . Heart disease Mother   . Heart disease Father   . Colon cancer Father   . Parkinson's disease Father   . Pulmonary embolism Daughter   . Heart disease Sister   . Thyroid disease Sister   . Heart disease Brother   . Thyroid disease Daughter   . Rectal cancer Neg Hx     Social History   Socioeconomic History  . Marital status: Married    Spouse name: Not on file  . Number of children: Not on file  . Years of education: Not on file  . Highest education level: Not on file  Occupational History  . Not on file  Social Needs  . Financial resource strain: Not on file  . Food insecurity:    Worry: Not on file    Inability: Not on file  . Transportation needs:    Medical: Not on file    Non-medical: Not on file  Tobacco Use  . Smoking status: Never Smoker  .  Smokeless tobacco: Never Used  Substance and Sexual Activity  . Alcohol use: Yes    Alcohol/week: 0.0 standard drinks    Comment: rare  . Drug use: No  . Sexual activity: Yes    Birth control/protection: None  Lifestyle  . Physical activity:    Days per week: Not on file    Minutes per session: Not on file  . Stress: Not on file  Relationships  . Social connections:    Talks on phone: Not on file    Gets together: Not on file    Attends religious service: Not on file    Active member of club or organization: Not on file    Attends meetings of clubs or organizations: Not on file    Relationship status: Not on file  Other Topics Concern  . Not on file  Social History Narrative    [employer]. 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 albuterol (PROAIR HFA) 108 (90 Base) MCG/ACT inhaler Inhale 2 puffs into the lungs every 6 (six) hours as needed for wheezing. 3 Inhaler 0  . aspirin EC 81 MG tablet Take 1 tablet (81 mg total) by mouth daily.    Marland Kitchen atorvastatin (LIPITOR) 10 MG tablet TAKE 1 TABLET DAILY 90 tablet 3  . chlorpheniramine (CHLOR-TRIMETON) 4 MG tablet Take 4 mg by mouth daily as needed for allergies.    . Cholecalciferol (VITAMIN D) 2000 units tablet Take 2,000 Units by mouth daily.    Marland Kitchen dextromethorphan-guaiFENesin (MUCINEX DM) 30-600 MG 12hr tablet Take 1 tablet by mouth 2 (two) times daily as needed for cough.    . doxylamine, Sleep, (UNISOM) 25 MG tablet Take 25 mg by mouth at bedtime as needed.    Marland Kitchen escitalopram (LEXAPRO) 20 MG tablet Take 1 tablet (20 mg total) by mouth daily. 90 tablet 1  . famotidine (PEPCID) 20 MG tablet TAKE 1 TABLET AT BEDTIME 90 tablet 4  . fenofibrate 160 MG tablet TAKE 1 TABLET DAILY 90 tablet 3  . gabapentin  (NEURONTIN) 100 MG capsule Take 1 capsule (100 mg total) by mouth daily. (Patient taking differently: Take 100 mg by mouth daily. Patient states that she currently takes every other day for 4wks, and then she is weaning off this medication.) 270 capsule 3  . ibuprofen (ADVIL,MOTRIN) 800 MG tablet Take 1 tablet (800 mg total) by mouth every 8 (eight) hours as needed for moderate pain. For AFTER surgery 30 tablet 1  . levothyroxine (SYNTHROID, LEVOTHROID) 75 MCG tablet TAKE 1 TABLET DAILY 90 tablet 3  . lithium carbonate (LITHOBID) 300 MG CR tablet Take 1 tablet (300 mg total) by mouth 2 (two) times daily. 180 tablet 1  . loperamide (IMODIUM A-D) 2 MG tablet Take 1 mg by mouth daily.     . modafinil (PROVIGIL) 200 MG tablet Take 0.5 tablets (100 mg total) by mouth daily. 30 tablet 5  . Multiple Vitamins-Minerals (MULTIVITAMIN WITH MINERALS) tablet Take 1 tablet by mouth daily.    . pantoprazole (PROTONIX) 40 MG tablet TAKE 1 TABLET DAILY 90 tablet 4  . valACYclovir (VALTREX) 500 MG tablet valacyclovir 500 mg tablet  For outbreak: Take one tablet by oral route twice a day for 3 days. For suppression: Take one tablet by oral route daily.    . Wheat Dextrin (BENEFIBER) POWD Take 2 heaping teaspoons by mouth daily    . YUVAFEM 10 MCG TABS vaginal tablet Place 1 tablet vaginally 2 (two) times a week.      Facility-Administered Medications Prior to Visit  Medication Dose Route Frequency Provider Last Rate Last Dose  . 0.9 %  sodium chloride infusion  500 mL Intravenous Once Tressia Danas, MD         EXAM:  BP 104/60 (BP Location: Right Arm, Patient Position: Sitting, Cuff Size: Normal)   Pulse 85   Temp 99.7 F (37.6 C) (Oral)   Wt 138 lb (62.6 kg)   LMP 12/09/2013   SpO2 97%   BMI 25.65 kg/m   Body mass index is 25.65 kg/m.  GENERAL: vitals reviewed and listed above, alert, oriented, appears well hydrated and in no acute distress no toxic but sick congested and coughing  HEENT:  atraumatic, conjunctiva  clear, no obvious abnormalities  on inspection of external nose and ears tm clear OP : no lesion edema or exudate  Bright red cresents  No exudate  NECK: no obvious masses on inspection palpation  [place] ac nodes  LUNGS: clear to auscultation bilaterally, no wheezes, rales or rhonchi, wheezy bronchial cough  CV: HRRR, no clubbing cyanosis or  peripheral edema nl cap refill  MS: moves all extremities without noticeable focal  abnormality PSYCH: pleasant and cooperative, no obvious depression or anxiety  BP Readings from Last 3 Encounters:  09/18/18 104/60  09/12/18 121/67  08/22/18 112/62   poct pos inf a  Neg RS gp a  ASSESSMENT AND PLAN:  Discussed the following assessment and plan:  Influenza A  Flu-like symptoms - Plan: POC Influenza A/B  Exposure to influenza  Pharyngitis, unspecified etiology - Plan: POCT rapid strep A, Culture, Group A Strep  Exposure to strep throat - Plan: POCT rapid strep A, Culture, Group A Strep Comfort meds   Expectant management.  -Patient advised to return or notify health care team  if  new concerns arise.  Patient Instructions  tamiflu asap Checking  for strep . \supportive care  Can add you inhalers if getting wheezing .   No work for 24 hours without fever.  And feeling better     Neta MendsWanda K. Freddie Nghiem M.D.

## 2018-09-19 ENCOUNTER — Encounter: Payer: Self-pay | Admitting: Gynecologic Oncology

## 2018-09-19 ENCOUNTER — Encounter: Payer: Self-pay | Admitting: *Deleted

## 2018-09-19 NOTE — Telephone Encounter (Signed)
Please tell patient  I agree and get her a communication to be absent from work  As requested . She  Has influenza .

## 2018-09-19 NOTE — Telephone Encounter (Signed)
Note completed-see Mychart message.

## 2018-09-20 ENCOUNTER — Ambulatory Visit (INDEPENDENT_AMBULATORY_CARE_PROVIDER_SITE_OTHER): Payer: 59 | Admitting: Internal Medicine

## 2018-09-20 ENCOUNTER — Encounter: Payer: Self-pay | Admitting: Internal Medicine

## 2018-09-20 VITALS — BP 110/66 | HR 75 | Temp 99.1°F | Ht 61.5 in | Wt 134.0 lb

## 2018-09-20 DIAGNOSIS — R059 Cough, unspecified: Secondary | ICD-10-CM

## 2018-09-20 DIAGNOSIS — J101 Influenza due to other identified influenza virus with other respiratory manifestations: Secondary | ICD-10-CM | POA: Diagnosis not present

## 2018-09-20 DIAGNOSIS — R05 Cough: Secondary | ICD-10-CM

## 2018-09-20 LAB — CULTURE, GROUP A STREP
MICRO NUMBER: 221289
SPECIMEN QUALITY:: ADEQUATE

## 2018-09-20 NOTE — Patient Instructions (Signed)

## 2018-09-20 NOTE — Progress Notes (Signed)
HPI  Pt presents to the clinic today with c/o sore throat, cough and chest congestion. She reports this started about 4 days ago. The sore throat is getting better. The cough is productive of yellow mucous. She denies shortness of breath. She denies headache, ear pain, runny nose, chills or body aches. She has not run fevers in a few days. She has taken Ibuprofen OTC with minimal relief. She was seen 2 days ago for the same, diagnosed with the flu and is currently taking Albuterol, Tamiflu and Hydromet. She has had sick contacts. She has a history of asthma and is concerned that she might get pneumonia.  Review of Systems      Past Medical History:  Diagnosis Date  . Acid reflux   . Allergic rhinitis   . Asthma   . Depression   . Diarrhea 08/09/2011   Poss ibs  Vs other  Related to stress  consdier other eval if needed. Had colonoscoopy per dr Matthias Hughs   . Hemorrhoids   . High cholesterol   . 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 Hypothyroidism   . Medication side effect 11/01/2011   tussionex   distrubed sleep   . Thyroid disease     Family History  Problem Relation Age of Onset  . Heart disease Sister 26       cabg stent  . Arthritis Mother   . Heart disease Mother   . Heart disease Father   . Colon cancer Father   . Parkinson's disease Father   . Pulmonary embolism Daughter   . Heart disease Sister   . Thyroid disease Sister   . Heart disease Brother   . Thyroid disease Daughter   . Rectal cancer Neg Hx     Social History   Socioeconomic History  . Marital status: Married    Spouse name: Not on file  . Number of children: Not on file  . Years of education: Not on file  . Highest education level: Not on file  Occupational History  . Not on file  Social Needs  . Financial resource strain: Not on file  . Food insecurity:    Worry: Not on file    Inability: Not on file  . Transportation needs:    Medical: Not on file    Non-medical: Not on file  Tobacco Use  . Smoking status:  Never Smoker  . Smokeless tobacco: Never Used  Substance and Sexual Activity  . Alcohol use: Yes    Alcohol/week: 0.0 standard drinks    Comment: rare  . Drug use: No  . Sexual activity: Yes    Birth control/protection: None  Lifestyle  . Physical activity:    Days per week: Not on file    Minutes per session: Not on file  . Stress: Not on file  Relationships  . Social connections:    Talks on phone: Not on file    Gets together: Not on file    Attends religious service: Not on file    Active member of club or organization: Not on file    Attends meetings of clubs or organizations: Not on file    Relationship status: Not on file  . Intimate partner violence:    Fear of current or ex partner: Not on file    Emotionally abused: Not on file    Physically abused: Not on file    Forced sexual activity: Not on file  Other Topics Concern  . Not on file  Social History Narrative    [employer]. 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 Penicillins Hives and Shortness Of Breath    Has patient had a PCN reaction causing immediate rash, facial/tongue/throat swelling, SOB or lightheadedness with hypotension: Yes Has patient had a PCN reaction causing severe rash involving mucus membranes or skin necrosis: No Has patient had a PCN reaction that required hospitalization No Has patient had a PCN reaction occurring within the last 10 years: No If all of the above answers are "NO", then may proceed with Cephalosporin use.   . Atorvastatin Other (See Comments)    Causes lower extremity muscle aches  . Pravastatin Other (See Comments)    Cause muscle aches and cramps  . Rosuvastatin Other (See Comments)    Caused pain in hands, shoulders, back, and indigestion     Constitutional: Denies  headache, fatigue, fever or abrupt weight changes.  HEENT:  Positive sore throat. Denies eye redness, eye pain, pressure behind the eyes, facial pain, nasal congestion, ear pain, ringing in the ears, wax buildup, runny nose or bloody nose. Respiratory: Positive cough. Denies difficulty breathing or shortness of breath.  Cardiovascular: Denies chest pain, chest tightness, palpitations or swelling in the hands or feet.   No other specific complaints in a complete review of systems (except as listed in HPI above).  Objective:   BP 110/66   Pulse 75   Temp 99.1 F (37.3 C) (Oral)   Ht 5' 1.5" (1.562 m)   Wt 134 lb (60.8 kg)   LMP 12/09/2013   SpO2 96%   BMI 24.91 kg/m  Wt Readings from Last 3 Encounters:  09/20/18 134 lb (60.8 kg)  09/18/18 138 lb (62.6 kg)  08/22/18 135 lb (61.2 kg)     General: Appears her stated age, well developed, well nourished in NAD. HEENT: Head: normal shape and size, no sinus tenderness noted;  Ears: Tm's gray and intact, normal light reflex; Nose: mucosa pink and moist, septum midline; Throat/Mouth: + PND. Teeth present, mucosa erythematous and moist, no exudate noted, no lesions or ulcerations noted.  Neck: No cervical lymphadenopathy.  Cardiovascular: Normal rate and rhythm. S1,S2 noted.  No murmur, rubs or gallops noted.  Pulmonary/Chest: Normal effort and positive vesicular breath sounds. No respiratory distress. No wheezes, rales or ronchi noted.       Assessment & Plan:   Influenza A, Cough:  Get some rest and drink plenty of water Do salt water gargles for the sore throat Continue Tamiflu, Albuterol and Hydromet No indication for abx or steroids at this time  RTC as needed or if symptoms persist.   Nicki Reaper, NP

## 2018-09-22 NOTE — Telephone Encounter (Signed)
Doubt  If antibiotic would help  But  we can get a chest x ray to be sure no surprises     Antibiotic may have a risk of  Side effects that could have opposite effect  But  If we and you want to risk that we can add   A z pack  (But would still want an chest x ray first )

## 2018-09-23 ENCOUNTER — Encounter (HOSPITAL_BASED_OUTPATIENT_CLINIC_OR_DEPARTMENT_OTHER): Payer: Self-pay | Admitting: *Deleted

## 2018-09-23 ENCOUNTER — Other Ambulatory Visit: Payer: Self-pay

## 2018-09-23 NOTE — Progress Notes (Signed)
Spoke with patient via telephone for pre op interview. NPO after MN. Patient to take Lexapro, Synthroid, Protonix and Lithobid AM of surgery with a sip of water. Arrival time 76. EKG on chart and in Epic.

## 2018-09-24 ENCOUNTER — Other Ambulatory Visit: Payer: Self-pay

## 2018-09-24 ENCOUNTER — Ambulatory Visit (INDEPENDENT_AMBULATORY_CARE_PROVIDER_SITE_OTHER): Payer: 59

## 2018-09-24 DIAGNOSIS — R05 Cough: Secondary | ICD-10-CM

## 2018-09-24 DIAGNOSIS — R059 Cough, unspecified: Secondary | ICD-10-CM

## 2018-09-25 ENCOUNTER — Ambulatory Visit (HOSPITAL_BASED_OUTPATIENT_CLINIC_OR_DEPARTMENT_OTHER)
Admission: RE | Admit: 2018-09-25 | Discharge: 2018-09-25 | Disposition: A | Discharge status: Home / Self Care | Payer: 59 | Attending: Gynecologic Oncology | Admitting: Gynecologic Oncology

## 2018-09-25 ENCOUNTER — Encounter (HOSPITAL_BASED_OUTPATIENT_CLINIC_OR_DEPARTMENT_OTHER)
Admission: RE | Disposition: A | Discharge status: Home / Self Care | Payer: Self-pay | Source: Home / Self Care | Attending: Gynecologic Oncology

## 2018-09-25 ENCOUNTER — Ambulatory Visit (HOSPITAL_BASED_OUTPATIENT_CLINIC_OR_DEPARTMENT_OTHER): Payer: 59 | Admitting: Anesthesiology

## 2018-09-25 ENCOUNTER — Encounter: Payer: Self-pay | Admitting: Internal Medicine

## 2018-09-25 ENCOUNTER — Encounter (HOSPITAL_BASED_OUTPATIENT_CLINIC_OR_DEPARTMENT_OTHER): Payer: Self-pay

## 2018-09-25 DIAGNOSIS — Z791 Long term (current) use of non-steroidal anti-inflammatories (NSAID): Secondary | ICD-10-CM | POA: Diagnosis not present

## 2018-09-25 DIAGNOSIS — Z79899 Other long term (current) drug therapy: Secondary | ICD-10-CM | POA: Insufficient documentation

## 2018-09-25 DIAGNOSIS — E039 Hypothyroidism, unspecified: Secondary | ICD-10-CM | POA: Diagnosis not present

## 2018-09-25 DIAGNOSIS — Z7982 Long term (current) use of aspirin: Secondary | ICD-10-CM | POA: Diagnosis not present

## 2018-09-25 DIAGNOSIS — Z888 Allergy status to other drugs, medicaments and biological substances status: Secondary | ICD-10-CM | POA: Diagnosis not present

## 2018-09-25 DIAGNOSIS — Z7951 Long term (current) use of inhaled steroids: Secondary | ICD-10-CM | POA: Insufficient documentation

## 2018-09-25 DIAGNOSIS — J45909 Unspecified asthma, uncomplicated: Secondary | ICD-10-CM | POA: Diagnosis not present

## 2018-09-25 DIAGNOSIS — K219 Gastro-esophageal reflux disease without esophagitis: Secondary | ICD-10-CM | POA: Insufficient documentation

## 2018-09-25 DIAGNOSIS — Z8261 Family history of arthritis: Secondary | ICD-10-CM | POA: Insufficient documentation

## 2018-09-25 DIAGNOSIS — Z820 Family history of epilepsy and other diseases of the nervous system: Secondary | ICD-10-CM | POA: Diagnosis not present

## 2018-09-25 DIAGNOSIS — Z880 Allergy status to penicillin: Secondary | ICD-10-CM | POA: Insufficient documentation

## 2018-09-25 DIAGNOSIS — N870 Mild cervical dysplasia: Secondary | ICD-10-CM | POA: Diagnosis not present

## 2018-09-25 DIAGNOSIS — R87611 Atypical squamous cells cannot exclude high grade squamous intraepithelial lesion on cytologic smear of cervix (ASC-H): Secondary | ICD-10-CM | POA: Diagnosis not present

## 2018-09-25 DIAGNOSIS — N879 Dysplasia of cervix uteri, unspecified: Secondary | ICD-10-CM | POA: Diagnosis present

## 2018-09-25 DIAGNOSIS — E7800 Pure hypercholesterolemia, unspecified: Secondary | ICD-10-CM | POA: Diagnosis not present

## 2018-09-25 DIAGNOSIS — F329 Major depressive disorder, single episode, unspecified: Secondary | ICD-10-CM | POA: Diagnosis not present

## 2018-09-25 DIAGNOSIS — Z800 Family history of malignant neoplasm of digestive organs: Secondary | ICD-10-CM | POA: Diagnosis not present

## 2018-09-25 DIAGNOSIS — Z8249 Family history of ischemic heart disease and other diseases of the circulatory system: Secondary | ICD-10-CM | POA: Insufficient documentation

## 2018-09-25 HISTORY — PX: CERVICAL CONIZATION W/BX: SHX1330

## 2018-09-25 SURGERY — CONE BIOPSY, CERVIX
Anesthesia: General

## 2018-09-25 MED ORDER — ACETIC ACID 5 % SOLN
Status: DC | PRN
  Administered 2018-09-25: 1 via TOPICAL

## 2018-09-25 MED ORDER — 0.9 % SODIUM CHLORIDE (POUR BTL) OPTIME
TOPICAL | Status: DC | PRN
  Administered 2018-09-25: 500 mL

## 2018-09-25 MED ORDER — KETOROLAC TROMETHAMINE 30 MG/ML IJ SOLN
INTRAMUSCULAR | Status: AC
  Filled 2018-09-25: qty 1

## 2018-09-25 MED ORDER — FERRIC SUBSULFATE SOLN
Status: DC | PRN
  Administered 2018-09-25: 1

## 2018-09-25 MED ORDER — FENTANYL CITRATE (PF) 100 MCG/2ML IJ SOLN
INTRAMUSCULAR | Status: AC
  Filled 2018-09-25: qty 2

## 2018-09-25 MED ORDER — ONDANSETRON HCL 4 MG/2ML IJ SOLN
INTRAMUSCULAR | Status: DC | PRN
  Administered 2018-09-25: 4 mg via INTRAVENOUS

## 2018-09-25 MED ORDER — ONDANSETRON HCL 4 MG/2ML IJ SOLN
INTRAMUSCULAR | Status: AC
  Filled 2018-09-25: qty 2

## 2018-09-25 MED ORDER — LIDOCAINE 2% (20 MG/ML) 5 ML SYRINGE
INTRAMUSCULAR | Status: DC | PRN
  Administered 2018-09-25: 60 mg via INTRAVENOUS

## 2018-09-25 MED ORDER — LIDOCAINE-EPINEPHRINE 1 %-1:100000 IJ SOLN
INTRAMUSCULAR | Status: DC | PRN
  Administered 2018-09-25: 10 mL

## 2018-09-25 MED ORDER — MIDAZOLAM HCL 2 MG/2ML IJ SOLN
INTRAMUSCULAR | Status: AC
  Filled 2018-09-25: qty 2

## 2018-09-25 MED ORDER — OXYCODONE HCL 5 MG PO TABS: 5.0000 mg | ORAL_TABLET | ORAL | 0 refills | Status: DC | PRN

## 2018-09-25 MED ORDER — EPHEDRINE SULFATE-NACL 50-0.9 MG/10ML-% IV SOSY
PREFILLED_SYRINGE | INTRAVENOUS | Status: DC | PRN
  Administered 2018-09-25: 20 mg via INTRAVENOUS

## 2018-09-25 MED ORDER — PROPOFOL 10 MG/ML IV BOLUS
INTRAVENOUS | Status: AC
  Filled 2018-09-25: qty 20

## 2018-09-25 MED ORDER — LACTATED RINGERS IV SOLN
INTRAVENOUS | Status: DC
  Administered 2018-09-25 (×2): via INTRAVENOUS
  Filled 2018-09-25: qty 1000

## 2018-09-25 MED ORDER — PROPOFOL 10 MG/ML IV BOLUS
INTRAVENOUS | Status: DC | PRN
  Administered 2018-09-25: 150 mg via INTRAVENOUS

## 2018-09-25 MED ORDER — DEXAMETHASONE SODIUM PHOSPHATE 10 MG/ML IJ SOLN
INTRAMUSCULAR | Status: AC
  Filled 2018-09-25: qty 1

## 2018-09-25 MED ORDER — FENTANYL CITRATE (PF) 100 MCG/2ML IJ SOLN
INTRAMUSCULAR | Status: DC | PRN
  Administered 2018-09-25: 50 ug via INTRAVENOUS
  Administered 2018-09-25: 25 ug via INTRAVENOUS

## 2018-09-25 MED ORDER — LIDOCAINE 2% (20 MG/ML) 5 ML SYRINGE
INTRAMUSCULAR | Status: AC
  Filled 2018-09-25: qty 5

## 2018-09-25 MED ORDER — DEXAMETHASONE SODIUM PHOSPHATE 4 MG/ML IJ SOLN
INTRAMUSCULAR | Status: DC | PRN
  Administered 2018-09-25: 8 mg via INTRAVENOUS

## 2018-09-25 MED ORDER — MIDAZOLAM HCL 5 MG/5ML IJ SOLN
INTRAMUSCULAR | Status: DC | PRN
  Administered 2018-09-25: 2 mg via INTRAVENOUS

## 2018-09-25 SURGICAL SUPPLY — 29 items
APPLICATOR COTTON TIP 6IN STRL (MISCELLANEOUS) ×2 IMPLANT
BLADE EXTENDED COATED 6.5IN (ELECTRODE) ×2 IMPLANT
BLADE SURG 11 STRL SS (BLADE) ×2 IMPLANT
CANISTER SUCT 3000ML PPV (MISCELLANEOUS) IMPLANT
CATH ROBINSON RED A/P 16FR (CATHETERS) ×2 IMPLANT
COVER WAND RF STERILE (DRAPES) ×2 IMPLANT
DRSG TELFA 3X8 NADH (GAUZE/BANDAGES/DRESSINGS) ×2 IMPLANT
ELECT BALL LEEP 5MM RED (ELECTRODE) ×2 IMPLANT
GLOVE BIO SURGEON STRL SZ 6 (GLOVE) ×2 IMPLANT
GLOVE BIO SURGEON STRL SZ7 (GLOVE) ×2 IMPLANT
GLOVE BIOGEL M 7.0 STRL (GLOVE) ×4 IMPLANT
GLOVE BIOGEL PI IND STRL 6.5 (GLOVE) ×1 IMPLANT
GLOVE BIOGEL PI INDICATOR 6.5 (GLOVE) ×1
GOWN STRL REUS W/ TWL LRG LVL3 (GOWN DISPOSABLE) ×3 IMPLANT
GOWN STRL REUS W/TWL LRG LVL3 (GOWN DISPOSABLE) ×3
KIT TURNOVER CYSTO (KITS) ×2 IMPLANT
LEGGING LITHOTOMY PAIR STRL (DRAPES) ×2 IMPLANT
NS IRRIG 500ML POUR BTL (IV SOLUTION) IMPLANT
PACK VAGINAL WOMENS (CUSTOM PROCEDURE TRAY) ×2 IMPLANT
PAD PREP 24X48 CUFFED NSTRL (MISCELLANEOUS) ×2 IMPLANT
SPONGE SURGIFOAM ABS GEL 12-7 (HEMOSTASIS) IMPLANT
SUT VIC AB 0 CT1 36 (SUTURE) ×4 IMPLANT
SUT VIC AB 2-0 CT2 27 (SUTURE) IMPLANT
SUT VIC AB 2-0 UR6 27 (SUTURE) IMPLANT
SUT VICRYL 0 UR6 27IN ABS (SUTURE) ×4 IMPLANT
SWAB OB GYN 8IN STERILE 2PK (MISCELLANEOUS) ×6 IMPLANT
SYR BULB IRRIGATION 50ML (SYRINGE) ×2 IMPLANT
TUBE CONNECTING 12X1/4 (SUCTIONS) IMPLANT
VACUUM HOSE/TUBING 7/8INX6FT (MISCELLANEOUS) IMPLANT

## 2018-09-25 NOTE — Anesthesia Preprocedure Evaluation (Addendum)
Anesthesia Evaluation  Patient identified by MRN, date of birth, ID band Patient awake    Reviewed: Allergy & Precautions, NPO status , Patient's Chart, lab work & pertinent test results  History of Anesthesia Complications Negative for: history of anesthetic complications  Airway Mallampati: II  TM Distance: >3 FB Neck ROM: Full    Dental  (+) Dental Advisory Given, Teeth Intact   Pulmonary asthma ,   Recently treated for influenza A. Non-productive, mild cough at present (improving)     breath sounds clear to auscultation       Cardiovascular negative cardio ROS   Rhythm:Regular Rate:Normal     Neuro/Psych PSYCHIATRIC DISORDERS Depression Bipolar Disorder negative neurological ROS     GI/Hepatic Neg liver ROS, GERD  Medicated and Controlled,  Endo/Other  Hypothyroidism   Renal/GU negative Renal ROS     Musculoskeletal negative musculoskeletal ROS (+)   Abdominal   Peds  Hematology negative hematology ROS (+)   Anesthesia Other Findings   Reproductive/Obstetrics                           Anesthesia Physical Anesthesia Plan  ASA: II  Anesthesia Plan: General   Post-op Pain Management:    Induction: Intravenous  PONV Risk Score and Plan: 3 and Treatment may vary due to age or medical condition, Ondansetron, Dexamethasone and Midazolam  Airway Management Planned: LMA  Additional Equipment: None  Intra-op Plan:   Post-operative Plan: Extubation in OR  Informed Consent: I have reviewed the patients History and Physical, chart, labs and discussed the procedure including the risks, benefits and alternatives for the proposed anesthesia with the patient or authorized representative who has indicated his/her understanding and acceptance.     Dental advisory given  Plan Discussed with: CRNA and Anesthesiologist  Anesthesia Plan Comments:        Anesthesia Quick  Evaluation

## 2018-09-25 NOTE — Anesthesia Postprocedure Evaluation (Signed)
Anesthesia Post Note  Patient: Lisa Lee  Procedure(s) Performed: CONIZATION CERVIX WITH BIOPSY (N/A )     Patient location during evaluation: PACU Anesthesia Type: General Level of consciousness: awake and alert Pain management: pain level controlled Vital Signs Assessment: post-procedure vital signs reviewed and stable Respiratory status: spontaneous breathing, nonlabored ventilation and respiratory function stable Cardiovascular status: blood pressure returned to baseline and stable Postop Assessment: no apparent nausea or vomiting Anesthetic complications: no    Last Vitals:  Vitals:   09/25/18 1030 09/25/18 1045  BP: 115/60 (!) 109/58  Pulse: 84 80  Resp: 13 13  Temp:    SpO2: 98% 92%    Last Pain:  Vitals:   09/25/18 1030  TempSrc:   PainSc: 0-No pain                 Beryle Lathe

## 2018-09-25 NOTE — Discharge Instructions (Signed)
Post Anesthesia Home Care Instructions  Activity: Get plenty of rest for the remainder of the day. A responsible individual must stay with you for 24 hours following the procedure.  For the next 24 hours, DO NOT: -Drive a car -Advertising copywriter -Drink alcoholic beverages -Take any medication unless instructed by your physician -Make any legal decisions or sign important papers.  Meals: Start with liquid foods such as gelatin or soup. Progress to regular foods as tolerated. Avoid greasy, spicy, heavy foods. If nausea and/or vomiting occur, drink only clear liquids until the nausea and/or vomiting subsides. Call your physician if vomiting continues.  Special Instructions/Symptoms: Your throat may feel dry or sore from the anesthesia or the breathing tube placed in your throat during surgery. If this causes discomfort, gargle with warm salt water. The discomfort should disappear within 24 hours.  If you had a scopolamine patch placed behind your ear for the management of post- operative nausea and/or vomiting:  1. The medication in the patch is effective for 72 hours, after which it should be removed.  Wrap patch in a tissue and discard in the trash. Wash hands thoroughly with soap and water. 2. You may remove the patch earlier than 72 hours if you experience unpleasant side effects which may include dry mouth, dizziness or visual disturbances. 3. Avoid touching the patch. Wash your hands with soap and water after contact with the patch.    Cold Knife [place], Care After ACTIVITY  Rest as much as possible the first two days after discharge.  You do not have weight restrictions on lifting  Avoid strenuous working out such as running or lifting weights for 48-72 hours  You can climb stairs and drive a car NUTRITION  You may resume your normal diet.  Drink 6 to 8 glasses of fluids a day.  Eat a healthy, balanced diet including portions of food from the meat (protein), milk,  fruit, vegetable, and bread groups.  Your caregiver may recommend you take a multivitamin with iron.  ELIMINATION   If constipation occurs, drink more liquids, and add more fruits, vegetables, and bran to your diet. You may take a mild laxative, such as Milk of Magnesia, Metamucil, or a stool softener such as Colace, with permission from your caregiver.  HYGIENE  You may shower and wash your hair.  Avoid tub baths for 4 weeks  Do not add any bath oils or chemicals to your bath water, after you have permission to take baths.  Avoid placing anything in the vagina for 4 weeks.  It is normal to pass a brown-flecked discharge from the vagina for several weeks after your procedure.  HOME CARE INSTRUCTIONS   Take your temperature twice a day and record it, especially if you feel feverish or have chills.  Follow your caregiver's instructions about medicines, activity, and follow-up appointments after surgery.  Do not drink alcohol while taking pain medicine.  You may take over-the-counter medicine for pain, recommended by your caregiver.  If your pain is not relieved with medicine, call your caregiver.  Do not take aspirin because it can cause bleeding.  Do not douche or use tampons (use a nonperfumed sanitary pad).  Do not have sexual intercourse for 6 weeks postoperatively. Hugging, kissing, and playful sexual activity is fine with your caregiver's permission.  Take showers instead of baths, until your caregiver gives you permission to take baths.  You may take a mild medicine for constipation, recommended by your caregiver. Bran foods and drinking a  lot of fluids will help with constipation.  Make sure your family understands everything about your operation and recovery.  SEEK MEDICAL CARE IF:    You notice a foul smell coming from the vagina.  You have painful or bloody urination.  You develop nausea and vomiting.  You develop diarrhea.  You develop a rash.  You  have a reaction or allergy from the medicine.  You feel dizzy or light-headed.  You need stronger pain medicine.   SEEK IMMEDIATE MEDICAL CARE IF:   You develop a temperature of 102 F (38.9 C) or higher.  You pass out.  You develop leg or chest pain.  You develop abdominal pain.  You develop shortness of breath.  You bleed heavier than a period (soaking through 2 or more pads per hour for 2 hours in a row).  You see pus in the wound area.  MAKE SURE YOU:   Understand these instructions.  Will watch your condition.  Will get help right away if you are not doing well or get worse. Document Released: 02/28/2004 Document Revised: 11/30/2013 Document Reviewed: 06/17/2009 [facility] Patient Information 2015 [place], [place]. This information is not intended to replace advice given to you by your health care provider. Make sure you discuss any questions you have with your health care provider.

## 2018-09-25 NOTE — H&P (Signed)
Consult Note: Gyn-Onc  Consult was requested by Dr. Stefano Gaul for the evaluation of Lisa Lee 58 y.o. female  CC:      Chief Complaint  Patient presents with  . Abnormal Pap Smear    ASC-US, high risk HPV present    Assessment/Plan:  Lisa Lee  is a 58 y.o.  year old for an abnormal pap and positive high risk HPV.  On examination the cervix is grossly normal but small.  I think a conization for diagnostic purposes is reasonable.   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 Stefano Gaul.  Given the small size of her cervix, I think she would be best served with a conization procedure which can tailor the size of the excision to the cervical anatomy.  We discussed cone biopsies, their risks, the anticipated recovery.  She has had a current procedure performed feels comfortable proceeding.  I discussed that after the conization she may require additional procedures depending upon the final pathology of that specimen.  We had a lengthy discussion about the natural history of HPV.  I discussed that hysterectomy would not eliminate HPV infection, and she would continue to require at least 20 additional years of surveillance or longer if she continued to have persistent HPV infection, which she likely well.  I discussed that vaginal cancer is still possible after hysterectomy with the presence of HPV and history of dysplasia.  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  However certainly hysterectomy is associated with a reduction in future episodes of high-grade dysplastic events including in the vagina.  I discussed that surveillance of vaginal dysplasia can be more technically difficult compared with cervical dysplasia  given the fact that the vagina contains irrigations which make complete and thorough evaluation challenging.  Therefore I am not recommending hysterectomy at this time.  I did discuss however that sometimes hysterectomy is necessary for further diagnostic work-up of high-grade dysplastic Pap smears in patients who have had multiple prior excisional procedures and have minimal residual cervix for additional excisional procedures.   HPI: Lisa Lee is a 58 year old parous woman who is seen in consultation at the request of Dr. 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  The patient's cervical dysplasia history began in 2015 when a Pap smear revealed low-grade squamous lesion encompassing mild CIN-1 with a few cells but suspicious for high-grade lesion.  This was followed up by a conization in 2016 which showed only benign findings.  A Pap smear in 2017 showed ASCUS with high-risk HPV present.  Colposcopy and biopsy showed CIN-1.  Her Pap smear in 2018 showed ASCUS with high risk HPV, colposcopy with biopsy showed benign findings.  Most recently her Pap in December 2019 showed ASC- H with HPV high risk again positive.  Colposcopy and biopsies were performed however colposcopy was unsatisfactory because the transfer transitional zone could not be completely seen.  Biopsies from this showed a negative ECC and a negative cervical biopsy.  Pap smear taken at that same time showed ASCUS.  Current Meds:      Outpatient Encounter Medications as of 09/12/2018  Medication Sig  . albuterol (PROAIR HFA) 108 (90 Base) MCG/ACT inhaler Inhale 2 puffs into the lungs every 6 (  six) hours as needed for wheezing.  Marland Kitchen aspirin EC 81 MG tablet Take 1 tablet (81 mg total) by mouth daily.  Marland Kitchen atorvastatin (LIPITOR) 10 MG tablet TAKE 1 TABLET DAILY  . Cholecalciferol (VITAMIN D) 2000 units tablet Take 2,000 Units by mouth daily.  Marland Kitchen  dextromethorphan-guaiFENesin (MUCINEX DM) 30-600 MG 12hr tablet Take 1 tablet by mouth 2 (two) times daily as needed for cough.  . doxylamine, Sleep, (UNISOM) 25 MG tablet Take 25 mg by mouth at bedtime as needed.  Marland Kitchen escitalopram (LEXAPRO) 20 MG tablet Take 1 tablet (20 mg total) by mouth daily.  . famotidine (PEPCID) 20 MG tablet TAKE 1 TABLET AT BEDTIME  . fenofibrate 160 MG tablet TAKE 1 TABLET DAILY  . gabapentin (NEURONTIN) 100 MG capsule Take 1 capsule (100 mg total) by mouth daily. (Patient taking differently: Take 100 mg by mouth daily. Patient states that she currently takes every other day for 4wks, and then she is weaning off this medication.)  . levothyroxine (SYNTHROID, LEVOTHROID) 75 MCG tablet TAKE 1 TABLET DAILY  . lithium carbonate (LITHOBID) 300 MG CR tablet Take 1 tablet (300 mg total) by mouth 2 (two) times daily.  Marland Kitchen loperamide (IMODIUM A-D) 2 MG tablet Take 1 mg by mouth daily.   . modafinil (PROVIGIL) 200 MG tablet Take 0.5 tablets (100 mg total) by mouth daily.  . Multiple Vitamins-Minerals (MULTIVITAMIN WITH MINERALS) tablet Take 1 tablet by mouth daily.  . pantoprazole (PROTONIX) 40 MG tablet TAKE 1 TABLET DAILY  . valACYclovir (VALTREX) 500 MG tablet valacyclovir 500 mg tablet  For outbreak: Take one tablet by oral route twice a day for 3 days. For suppression: Take one tablet by oral route daily.  . Wheat Dextrin (BENEFIBER) POWD Take 2 heaping teaspoons by mouth daily  . YUVAFEM 10 MCG TABS vaginal tablet Place 1 tablet vaginally 2 (two) times a week.   . [DISCONTINUED] ibuprofen (ADVIL,MOTRIN) 200 MG tablet Take 400 mg by mouth every 6 (six) hours as needed for headache or mild pain.   . chlorpheniramine (CHLOR-TRIMETON) 4 MG tablet Take 4 mg by mouth daily as needed for allergies.  Marland Kitchen ibuprofen (ADVIL,MOTRIN) 800 MG tablet Take 1 tablet (800 mg total) by mouth every 8 (eight) hours as needed for moderate pain. For AFTER surgery  . [DISCONTINUED] lidocaine (XYLOCAINE) 5  % ointment APPLY TO AFFECTED AREA 4 TIMES DAILY AS NEEDED  . [DISCONTINUED] Misc. Devices (ACAPELLA) MISC Use as directed (Patient not taking: Reported on 09/12/2018)      Facility-Administered Encounter Medications as of 09/12/2018  Medication  . 0.9 %  sodium chloride infusion    Allergy:       Allergies  Allergen Reactions  . Penicillins Hives and Shortness Of Breath    Has patient had a PCN reaction causing immediate rash, facial/tongue/throat swelling, SOB or lightheadedness with hypotension: Yes Has patient had a PCN reaction causing severe rash involving mucus membranes or skin necrosis: No Has patient had a PCN reaction that required hospitalization No Has patient had a PCN reaction occurring within the last 10 years: No If all of the above answers are "NO", then may proceed with Cephalosporin use.   . Atorvastatin Other (See Comments)    Causes lower extremity muscle aches  . Pravastatin Other (See Comments)    Cause muscle aches and cramps  . Rosuvastatin Other (See Comments)    Caused pain in hands, shoulders, back, and indigestion    Social Hx:   Social History  Socioeconomic History  . Marital status: Married    Spouse name: Not on file  . Number of children: Not on file  . Years of education: Not on file  . Highest education level: Not on file  Occupational History  . Not on file  Social Needs  . Financial resource strain: Not on file  . Food insecurity:    Worry: Not on file    Inability: Not on file  . Transportation needs:    Medical: Not on file    Non-medical: Not on file  Tobacco Use  . Smoking status: Never Smoker  . Smokeless tobacco: Never Used  Substance and Sexual Activity  . Alcohol use: Yes    Alcohol/week: 0.0 standard drinks    Comment: rare  . Drug use: No  . Sexual activity: Yes    Birth control/protection: None  Lifestyle  . Physical activity:    Days per week: Not on file    Minutes  per session: Not on file  . Stress: Not on file  Relationships  . Social connections:    Talks on phone: Not on file    Gets together: Not on file    Attends religious service: Not on file    Active member of club or organization: Not on file    Attends meetings of clubs or organizations: Not on file    Relationship status: Not on file  . Intimate partner violence:    Fear of current or ex partner: Not on file    Emotionally abused: Not on file    Physically abused: Not on file    Forced sexual activity: Not on file  Other Topics Concern  . Not on file  Social History Narrative    [employer]. 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 CERVICAL CONIZATION W/BX N/A 07/21/2015   Procedure: CONIZATION CERVIX WITH BIOPSY;  Surgeon: Kirkland Hun, MD;  Location: [facility];  Service: Gynecology;  Laterality: N/A;  . CERVICAL CRYOTHERAPY  1984  . COLPOSCOPY  08/26/13  . DILATION AND CURETTAGE, DIAGNOSTIC / THERAPEUTIC  1994   Blighted Ovum  . HYSTEROSCOPY     uterine polypectomy Jan 7th  . HYSTEROSCOPY WITH RESECTOSCOPE  08/04/2009   Removed polyp & IUD  . LYMPH NODE BIOPSY    . TONSILLECTOMY      Past Medical Hx:      Past Medical History:  Diagnosis Date  . Acid reflux   . Allergic rhinitis   . Asthma   . Depression   . Diarrhea 08/09/2011   Poss ibs  Vs other  Related to stress  consdier other eval if needed. Had colonoscoopy per dr Matthias Hughs   . Hemorrhoids   . High cholesterol   . HPV in female 45  . Hypothyroidism   . Medication side effect 11/01/2011   tussionex   distrubed sleep   . Thyroid disease     Past Gynecological History:  P2 (vaginal deliveries). Abnormal paps and  HPV since 2015. Patient's last menstrual period was 12/09/2013.  Family Hx:       Family History  Problem Relation Age of Onset  .  Heart disease Sister 4457       cabg stent  . Arthritis Mother   . Heart disease Mother   . Heart disease Father   . Colon cancer Father   . Parkinson's disease Father   . Pulmonary embolism Daughter   . Heart disease Sister   . Thyroid disease Sister   . Heart disease Brother   . Thyroid disease Daughter   . Rectal cancer Neg Hx     Review of Systems:  Constitutional  Feels well,    ENT Normal appearing ears and nares bilaterally Skin/Breast  No rash, sores, jaundice, itching, dryness Cardiovascular  No chest pain, shortness of breath, or edema  Pulmonary  No cough or wheeze.  Gastro Intestinal  No nausea, vomitting, or diarrhoea. No bright red blood per rectum, no abdominal pain, change in bowel movement, or constipation.  Genito Urinary  No frequency, urgency, dysuria, no bleeding Musculo Skeletal  No myalgia, arthralgia, joint swelling or pain  Neurologic  No weakness, numbness, change in gait,  Psychology  No depression, anxiety, insomnia.   Vitals:  Blood pressure 121/67, pulse 63, temperature 97.8 F (36.6 C), temperature source Oral, resp. rate 20, height 5' 1.5" (1.562 m), last menstrual period 12/09/2013, SpO2 100 %.  Physical Exam: WD in NAD Neck  Supple NROM, without any enlargements.  Lymph Node Survey No cervical supraclavicular or inguinal adenopathy Cardiovascular  Pulse normal rate, regularity and rhythm. S1 and S2 normal.  Lungs  Clear to auscultation bilateraly, without wheezes/crackles/rhonchi. Good air movement.  Skin  No rash/lesions/breakdown  Psychiatry  Alert and oriented to person, place, and time  Abdomen  Normoactive bowel sounds, abdomen soft, non-tender and nonobese without evidence of hernia.  Back No CVA tenderness Genito Urinary  Vulva/vagina: Normal external female  genitalia.  No lesions. No discharge or bleeding.             Bladder/urethra:  No lesions or masses, well supported bladder             Vagina: grossly normal             Cervix: Normal appearing, small, posteriorally flush with vagina, no lesions.             Uterus:  Small, mobile, no parametrial involvement or nodularity.             Adnexa: no palpable masses. Rectal  deferred Extremities  No bilateral cyanosis, clubbing or edema.   Luisa DagoEmma C Maleena Eddleman, MD

## 2018-09-25 NOTE — Transfer of Care (Signed)
  Last Vitals:  Vitals Value Taken Time  BP 107/58 09/25/2018 10:07 AM  Temp    Pulse 82 09/25/2018 10:09 AM  Resp 12 09/25/2018 10:09 AM  SpO2 97 % 09/25/2018 10:09 AM  Vitals shown include unvalidated device data.  Last Pain:  Vitals:   09/25/18 0733  TempSrc:   PainSc: 0-No pain      Patients Stated Pain Goal: 1 (09/25/18 5087) Immediate Anesthesia Transfer of Care Note  Patient: Lisa Lee  Procedure(s) Performed: Procedure(s) (LRB): CONIZATION CERVIX WITH BIOPSY (N/A)  Patient Location: PACU  Anesthesia Type: General  Level of Consciousness: awake, alert  and oriented  Airway & Oxygen Therapy: Patient Spontanous Breathing and Patient connected to nasal cannula oxygen  Post-op Assessment: Report given to PACU RN and Post -op Vital signs reviewed and stable  Post vital signs: Reviewed and stable  Complications: No apparent anesthesia complications

## 2018-09-25 NOTE — Anesthesia Procedure Notes (Signed)
Procedure Name: LMA Insertion Date/Time: 09/25/2018 9:30 AM Performed by: Beryle Lathe, MD Pre-anesthesia Checklist: Patient identified, Emergency Drugs available, Suction available and Patient being monitored Patient Re-evaluated:Patient Re-evaluated prior to induction Oxygen Delivery Method: Circle system utilized Preoxygenation: Pre-oxygenation with 100% oxygen Induction Type: IV induction Ventilation: Mask ventilation without difficulty LMA: LMA inserted LMA Size: 4.0 Number of attempts: 1 Airway Equipment and Method: Bite block Placement Confirmation: positive ETCO2 Tube secured with: Tape Dental Injury: Teeth and Oropharynx as per pre-operative assessment

## 2018-09-25 NOTE — Op Note (Signed)
.  PATIENT: [name] ENCOUNTER DATE: 09/25/18   Preop Diagnosis: ASC-H pap smear, inadequate colposcopic examination  Postoperative Diagnosis: same  Surgery: cold knife conization of cervix   Surgeons:  Quinn Axe, MD Assistant: none  Anesthesia: General   Estimated blood loss: <60ml  IVF:    Urine output: 50 ml   Complications: None   Pathology: cervical cone with marking stitch at 12 o'clock, post cone ECC  Operative findings: small, but grossly normal ectocervix with no acetowhite changes, cervical stenosis.  Procedure: The patient was identified in the preoperative holding area. Informed consent was signed on the chart. Patient was seen history was reviewed and exam was performed.   The patient was then taken to the operating room and placed in the supine position with SCD hose on. General anesthesia was then induced without difficulty. She was then placed in the dorsolithotomy position. The perineum was prepped with Betadine. The vagina was prepped with Betadine. The patient was then draped after the prep was dried. An in and out catheterization to empty the bladder was performed under sterile conditions.  Timeout was performed the patient, procedure, antibiotic, allergy, and length of procedure.   The weighted speculum was placed in the posterior vagina. The right angle retractor was placed anteriorally to visualize the cervix. A 0-vicryl suture on a UR-6 needle was used to place stay sutures (which were tagged) at 3 and 9 o'clock of the cervicovaginal junction. 10cc of 1% lidocaine with epinephrine was infiltrated into 3 and 9 o'clock of the cervicovaginal junction, circumferentially around the face of the cervix, and deep in the endocervical canal. The uterine sound was placed in the cervix to delineate the course of the endocervical canal. An 11 blade scalpel on a long knife handle was used to make an incision around the face of the cervix,  inside of the stay sutures, circumferentially. A single tooth tenaculum grasped the specimen to manipulate it. The incision was then angled towards the endocervix to amputate the specimen. It was removed, oriented with a marking stitch at the 12 o'clock ectocervix and sent to pathology. It was measured to be 3cm deep, by 2x2cm on the ectocervical face.   A post-cone ECC was collected from the endocervical canal using a kavorkian currette.  The bovie with the 19mm ball attachment was used at 45 coag to create hemostasis at the surgical bed. Monsels solution was applied to the surgical bed to consolidate this hemostasis.  The vagina was irrigated.  All instrument, suture, laparotomy, Ray-Tec, and needle counts were correct x2. The patient tolerated the procedure well and was taken recovery room in stable condition. This is Adolphus Birchwood dictating an operative note on Lisa Lee.

## 2018-09-26 ENCOUNTER — Encounter (HOSPITAL_BASED_OUTPATIENT_CLINIC_OR_DEPARTMENT_OTHER): Payer: Self-pay | Admitting: Gynecologic Oncology

## 2018-09-30 ENCOUNTER — Telehealth: Payer: Self-pay

## 2018-09-30 NOTE — Telephone Encounter (Signed)
Told Ms Conchar-Mabe that the Canonization showed low grade dysplasia per Warner Mccreedy, NP. R/s pt.'s post op appointment for 10-31-18. Pt doing well post-operatively.

## 2018-10-13 ENCOUNTER — Encounter: Payer: Self-pay | Admitting: Gynecologic Oncology

## 2018-10-13 NOTE — Telephone Encounter (Signed)
Called Ms Conchar-Mabe and told her that the stiches are dissolving and cause this bleeding per Warner Mccreedy, NP.  Call if the bleeding increases like a period. Pt verbalized understanding.

## 2018-10-16 ENCOUNTER — Telehealth: Payer: Self-pay

## 2018-10-16 MED ORDER — BENZONATATE 200 MG PO CAPS: 200.0000 mg | ORAL_CAPSULE | Freq: Two times a day (BID) | ORAL | 0 refills | Status: DC | PRN

## 2018-10-16 NOTE — Telephone Encounter (Signed)
Call in Benzonatate 200 mg to take bid prn cough, #30 with no rf. If she is still coughing by next week, have her make an OV with Dr. Fabian Sharp

## 2018-10-16 NOTE — Telephone Encounter (Signed)
Pt sent appt request via My Chart stating she still has lingering cough from recent Flu positive. She would like to know what recommendations or treatment could be given. Pt has been advised Dr. Fabian Sharp is out of office.   Dr. Clent Ridges - Please advise. Thanks!

## 2018-10-16 NOTE — Telephone Encounter (Signed)
Pt has been notified of prescription has been sent in

## 2018-10-17 ENCOUNTER — Telehealth: Payer: Self-pay | Admitting: Internal Medicine

## 2018-10-17 NOTE — Telephone Encounter (Signed)
rec'd voice message from this pt. On COVID 19 Question line.  Asked if pt's daughter, who will be visiting from [place], can be seen at the [facility] office, if she becomes symptomatic, while in [place].  Stated that her daughter is a former pt. of Dr. Fabian Sharp.  Advised since the daughter was seen over 3 yrs. Ago, she would be considered a New Patient;  Explained the policy is to sched. a NP appt., and then would be able to sched. an acute appt., within 30 day window of new patient appt.   Verb. Understanding. And agreed with plan.

## 2018-10-22 ENCOUNTER — Ambulatory Visit: Payer: 59 | Admitting: Gynecologic Oncology

## 2018-10-22 ENCOUNTER — Telehealth: Payer: Self-pay | Admitting: *Deleted

## 2018-10-22 NOTE — Telephone Encounter (Signed)
Called and spoke with the patient regarding her appt on 4/3. Explained that the hospital is asking the we combine our clinics and move routine appts. Rescheduled the patient's appt from 4/3 to 4/1. Explained that we will call her the day before and pre-screen her and then again the day of the appt at the front desk. Also explained the no visitor policy

## 2018-10-22 NOTE — Telephone Encounter (Signed)
Pt called in stating that Dr. Sherene Sires was telling her to come in (3 weeks ago) for a prolonged cough. She is wanting to come in next week. Please advise [phone]

## 2018-10-28 ENCOUNTER — Telehealth: Payer: Self-pay | Admitting: *Deleted

## 2018-10-28 NOTE — Telephone Encounter (Signed)
Per leadership, moved the patient's appt to that the daughter quarantined for a full 14 days. Appt moved to April 15th.

## 2018-10-28 NOTE — Telephone Encounter (Signed)
Called and spoke with patient regarding her appt for tomorrow. The patient has had no signs/symptoms, traveled or had an exposure due to OVID-19. The patient has not been around anyone sick. The patient does a daughter that came back to stay with them that lived in [place]. The patient stated "my daughter self quarantined herself before we brought her back here with [place]. She has been quarantined here for 12 days. She has no signs/symptoms." Explained that I will let them know the above. Explained that she will be asked the questions again tomorrow at the front desk, along with temperature. Also explained the no visitor policy.

## 2018-10-29 ENCOUNTER — Inpatient Hospital Stay: Payer: 59 | Admitting: Gynecologic Oncology

## 2018-10-31 ENCOUNTER — Ambulatory Visit: Payer: 59 | Admitting: Gynecologic Oncology

## 2018-11-10 ENCOUNTER — Telehealth: Payer: Self-pay | Admitting: *Deleted

## 2018-11-10 NOTE — Telephone Encounter (Signed)
Called and spoke with the patient regarding her appt on 4/15. Patient has had no signs/symptoms, has not traveled or had any exposure. Patient has had no contact with anyone sick, that traveled or had any exposure. Explained the no visitor policy and new parking

## 2018-11-11 ENCOUNTER — Encounter: Payer: BLUE CROSS/BLUE SHIELD | Admitting: Internal Medicine

## 2018-11-12 ENCOUNTER — Other Ambulatory Visit: Payer: Self-pay

## 2018-11-12 ENCOUNTER — Inpatient Hospital Stay: Payer: 59 | Attending: Gynecology | Admitting: Gynecologic Oncology

## 2018-11-12 ENCOUNTER — Encounter: Payer: Self-pay | Admitting: Gynecologic Oncology

## 2018-11-12 VITALS — BP 123/63 | HR 73 | Temp 98.0°F | Resp 18 | Ht 61.5 in | Wt 136.0 lb

## 2018-11-12 DIAGNOSIS — N870 Mild cervical dysplasia: Secondary | ICD-10-CM

## 2018-11-12 DIAGNOSIS — A630 Anogenital (venereal) warts: Secondary | ICD-10-CM

## 2018-11-12 NOTE — Patient Instructions (Signed)
Dr Andrey Farmer recommends the following OBGYN's: Dr Carrington Clamp - (ph) [phone] Dr Huel Cote - (ph) [phone]   She recommends that you see an OBGYN in 12 months for pap smear and HPV co-testing.  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   Dr Oliver Hum office can be reached at [phone].

## 2018-11-12 NOTE — Progress Notes (Signed)
Follow-up Note: Gyn-Onc  Consult was initially requested by Dr. Stefano Gaul for the evaluation of Lisa Lee 58 y.o. female  CC:  Chief Complaint  Patient presents with  . CIN I (cervical intraepithelial neoplasia I)    Assessment/Plan:  Lisa Lee  is a 58 y.o.  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  Recommendation is for surveillance pap in 12 months with HPV co-testing.   We had another lengthy discussion about the natural history of HPV.  I discussed that hysterectomy would not eliminate HPV infection, and she would continue to require at least 20 additional years of surveillance or longer if she continued to have persistent HPV infection, which she likely well.  I discussed that vaginal cancer is still possible after hysterectomy with the presence of HPV and history of dysplasia.  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  However certainly hysterectomy is associated with a reduction in future episodes of high-grade dysplastic events including in the vagina.  I discussed that surveillance of vaginal dysplasia can be more technically difficult compared with cervical dysplasia given the fact that the vagina contains irrigations which make complete and thorough evaluation challenging.  Therefore I am not recommending hysterectomy at this time.  I did discuss however that sometimes hysterectomy is necessary for further diagnostic work-up of high-grade dysplastic Pap smears in patients who have had multiple prior excisional procedures and have minimal residual cervix for additional excisional procedures.   HPI: Lisa Lee is a 58 year old parous woman who is seen in consultation at the request of Dr. 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  The  patient's cervical dysplasia history began in 2015 when a Pap smear revealed low-grade squamous lesion encompassing mild CIN-1 with a few cells but suspicious for high-grade lesion.  This was followed up by a conization in 2016 which showed only benign findings.  A Pap smear in 2017 showed ASCUS with high-risk HPV present.  Colposcopy and biopsy showed CIN-1.  Her Pap smear in 2018 showed ASCUS with high risk HPV, colposcopy with biopsy showed benign findings.  Most recently her Pap in December 2019 showed ASC- H with HPV high risk again positive.  Colposcopy and biopsies were performed however colposcopy was unsatisfactory because the transfer transitional zone could not be completely seen.  Biopsies from this showed a negative ECC and a negative cervical biopsy.  Pap smear taken at that same time showed ASCUS.  Interval hx:  On 09/25/18 she underwent cold knife conization of the cervix. Pathology revealed CIN I and ECC revealed koilocytic atypia consistent with human papilloma effect. Since the procedure she had significant persistent bleeding for the first 2 weeks, which has now resolved.   Current Meds:  Outpatient Encounter Medications as of 11/12/2018  Medication Sig  . albuterol (PROAIR HFA) 108 (90 Base) MCG/ACT inhaler Inhale 2 puffs into the lungs every 6 (six) hours as needed for wheezing.  Marland Kitchen aspirin EC 81 MG tablet Take 1 tablet (81 mg total) by mouth daily.  Marland Kitchen atorvastatin (LIPITOR) 10 MG tablet TAKE 1 TABLET DAILY  . Cholecalciferol (VITAMIN D) 2000 units tablet Take 2,000 Units by mouth daily.  Marland Kitchen dextromethorphan-guaiFENesin (MUCINEX DM) 30-600 MG 12hr tablet Take 1 tablet by mouth 2 (two) times daily as needed for cough.  . doxylamine, Sleep, (UNISOM)  25 MG tablet Take 25 mg by mouth at bedtime as needed.  Marland Kitchen. escitalopram (LEXAPRO) 20 MG tablet Take 1 tablet (20 mg total) by mouth daily.  . famotidine (PEPCID) 20 MG tablet TAKE 1 TABLET AT BEDTIME  . fenofibrate 160 MG tablet TAKE 1  TABLET DAILY  . gabapentin (NEURONTIN) 100 MG capsule Take 1 capsule (100 mg total) by mouth daily. (Patient taking differently: Take 100 mg by mouth daily. Patient states that she currently takes every other day for 4wks, and then she is weaning off this medication.)  . ibuprofen (ADVIL,MOTRIN) 800 MG tablet Take 1 tablet (800 mg total) by mouth every 8 (eight) hours as needed for moderate pain. For AFTER surgery  . levothyroxine (SYNTHROID, LEVOTHROID) 75 MCG tablet TAKE 1 TABLET DAILY  . lithium carbonate (LITHOBID) 300 MG CR tablet Take 1 tablet (300 mg total) by mouth 2 (two) times daily.  Marland Kitchen. loperamide (IMODIUM A-D) 2 MG tablet Take 1 mg by mouth daily.   . modafinil (PROVIGIL) 200 MG tablet Take 0.5 tablets (100 mg total) by mouth daily.  . Multiple Vitamins-Minerals (MULTIVITAMIN WITH MINERALS) tablet Take 1 tablet by mouth daily.  . pantoprazole (PROTONIX) 40 MG tablet TAKE 1 TABLET DAILY  . valACYclovir (VALTREX) 500 MG tablet valacyclovir 500 mg tablet  For outbreak: Take one tablet by oral route twice a day for 3 days. For suppression: Take one tablet by oral route daily.  . Wheat Dextrin (BENEFIBER) POWD Take 2 heaping teaspoons by mouth daily  . YUVAFEM 10 MCG TABS vaginal tablet Place 1 tablet vaginally 2 (two) times a week.   . [DISCONTINUED] benzonatate (TESSALON) 200 MG capsule Take 1 capsule (200 mg total) by mouth 2 (two) times daily as needed for cough.  . [DISCONTINUED] oxyCODONE (OXY IR/ROXICODONE) 5 MG immediate release tablet Take 1 tablet (5 mg total) by mouth every 4 (four) hours as needed for severe pain.   No facility-administered encounter medications on file as of 11/12/2018.     Allergy:  Allergies  Allergen Reactions  . Penicillins Hives and Shortness Of Breath    Has patient had a PCN reaction causing immediate rash, facial/tongue/throat swelling, SOB or lightheadedness with hypotension: Yes Has patient had a PCN reaction causing severe rash involving mucus  membranes or skin necrosis: No Has patient had a PCN reaction that required hospitalization No Has patient had a PCN reaction occurring within the last 10 years: No If all of the above answers are "NO", then may proceed with Cephalosporin use.   . Atorvastatin Other (See Comments)    Causes lower extremity muscle aches  . Pravastatin Other (See Comments)    Cause muscle aches and cramps  . Rosuvastatin Other (See Comments)    Caused pain in hands, shoulders, back, and indigestion    Social Hx:   Social History   Socioeconomic History  . Marital status: Married    Spouse name: Not on file  . Number of children: Not on file  . Years of education: Not on file  . Highest education level: Not on file  Occupational History  . Not on file  Social Needs  . Financial resource strain: Not on file  . Food insecurity:    Worry: Not on file    Inability: Not on file  . Transportation needs:    Medical: Not on file    Non-medical: Not on file  Tobacco Use  . Smoking status: Never Smoker  . Smokeless tobacco: Never Used  Substance  and Sexual Activity  . Alcohol use: Yes    Alcohol/week: 0.0 standard drinks    Comment: rare  . Drug use: No  . Sexual activity: Yes    Birth control/protection: None  Lifestyle  . Physical activity:    Days per week: Not on file    Minutes per session: Not on file  . Stress: Not on file  Relationships  . Social connections:    Talks on phone: Not on file    Gets together: Not on file    Attends religious service: Not on file    Active member of club or organization: Not on file    Attends meetings of clubs or organizations: Not on file    Relationship status: Not on file  . Intimate partner violence:    Fear of current or ex partner: Not on file    Emotionally abused: Not on file    Physically abused: Not on file    Forced sexual activity: Not on file  Other Topics Concern  . 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 CERVICAL CONIZATION W/BX N/A 07/21/2015   Procedure: CONIZATION CERVIX WITH BIOPSY;  Surgeon: Kirkland Hun, MD;  Location: [facility];  Service: Gynecology;  Laterality: N/A;  . CERVICAL CONIZATION W/BX N/A 09/25/2018   Procedure: CONIZATION CERVIX WITH BIOPSY;  Surgeon: Adolphus Birchwood, MD;  Location: [facility];  Service: Gynecology;  Laterality: N/A;  . CERVICAL CRYOTHERAPY  1984  . COLPOSCOPY  08/26/13  . DILATION AND CURETTAGE, DIAGNOSTIC / THERAPEUTIC  1994   Blighted Ovum  . HYSTEROSCOPY     uterine polypectomy Jan 7th  . HYSTEROSCOPY WITH RESECTOSCOPE  08/04/2009   Removed polyp & IUD  . LYMPH NODE BIOPSY    . TONSILLECTOMY      Past Medical Hx:  Past Medical History:  Diagnosis Date  . Acid reflux   . Allergic rhinitis   . Asthma   . Depression   . Diarrhea 08/09/2011   Poss ibs  Vs other  Related to stress  consdier other eval if needed. Had colonoscoopy per dr Matthias Hughs   . Hemorrhoids   . High cholesterol   . 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 Hypothyroidism   . Medication side effect 11/01/2011   tussionex   distrubed sleep   . Thyroid disease     Past Gynecological History:  P2 (vaginal deliveries). Abnormal paps and HPV since 2015. Patient's last menstrual period was 12/09/2013.  Family Hx:  Family History  Problem Relation Age of Onset  . Heart disease Sister 57       cabg stent  . Arthritis Mother   . Heart disease Mother   . Heart disease Father   . Colon cancer Father   . Parkinson's disease Father   . Pulmonary embolism Daughter   . Heart disease Sister   . Thyroid disease Sister   . Heart disease Brother   . Thyroid disease Daughter   . Rectal cancer Neg Hx     Review of  Systems:  Constitutional  Feels well,    ENT  Normal appearing ears and nares bilaterally Skin/Breast  No rash, sores, jaundice, itching, dryness Cardiovascular  No chest pain, shortness of breath, or edema  Pulmonary  No cough or wheeze.  Gastro Intestinal  No nausea, vomitting, or diarrhoea. No bright red blood per rectum, no abdominal pain, change in bowel movement, or constipation.  Genito Urinary  No frequency, urgency, dysuria, no bleeding Musculo Skeletal  No myalgia, arthralgia, joint swelling or pain  Neurologic  No weakness, numbness, change in gait,  Psychology  No depression, anxiety, insomnia.   Vitals:  Blood pressure 123/63, pulse 73, temperature 98 F (36.7 C), temperature source Oral, resp. rate 18, height 5' 1.5" (1.562 m), weight 136 lb (61.7 kg), last menstrual period 12/09/2013, SpO2 100 %.  Physical Exam: WD in NAD Neck  Supple NROM, without any enlargements.  Lymph Node Survey No cervical supraclavicular or inguinal adenopathy Cardiovascular  Pulse normal rate, regularity and rhythm. S1 and S2 normal.  Lungs  Clear to auscultation bilateraly, without wheezes/crackles/rhonchi. Good air movement.  Skin  No rash/lesions/breakdown  Psychiatry  Alert and oriented to person, place, and time  Abdomen  Normoactive bowel sounds, abdomen soft, non-tender and nonobese without evidence of hernia.  Back No CVA tenderness Genito Urinary  Vulva/vagina: Normal external female genitalia.  No lesions. No discharge or bleeding.  Bladder/urethra:  No lesions or masses, well supported bladder  Vagina: grossly normal  Cervix: very small, flush with vagina, granulation tissue minimal. No lesions  Uterus:  Small, mobile, no parametrial involvement or nodularity.  Adnexa: no palpable masses. Rectal  deferred Extremities  No bilateral cyanosis, clubbing or edema.   30 minutes of direct face to face counseling time was spent with the patient. This included  discussion about prognosis, therapy recommendations and postoperative side effects and are beyond the scope of routine postoperative care.   Luisa Dago, MD  11/12/2018, 3:52 PM

## 2018-11-18 NOTE — Telephone Encounter (Signed)
Suggest see  [facility]  Or ortho  Can see dr Terrilee Files  At [facility] if well   otherwAs he is seeing patients   Otherwise  Ortho  Office

## 2018-11-28 ENCOUNTER — Ambulatory Visit: Payer: 59 | Admitting: Family Medicine

## 2018-11-28 ENCOUNTER — Ambulatory Visit (INDEPENDENT_AMBULATORY_CARE_PROVIDER_SITE_OTHER): Payer: 59 | Admitting: Family Medicine

## 2018-11-28 ENCOUNTER — Other Ambulatory Visit: Payer: Self-pay

## 2018-11-28 ENCOUNTER — Encounter: Payer: Self-pay | Admitting: Family Medicine

## 2018-11-28 VITALS — BP 132/72 | HR 98 | Resp 16 | Wt 134.0 lb

## 2018-11-28 DIAGNOSIS — M7918 Myalgia, other site: Secondary | ICD-10-CM

## 2018-11-28 DIAGNOSIS — M7060 Trochanteric bursitis, unspecified hip: Secondary | ICD-10-CM | POA: Insufficient documentation

## 2018-11-28 MED ORDER — IBUPROFEN-FAMOTIDINE 800-26.6 MG PO TABS: 1.0000 | ORAL_TABLET | Freq: Three times a day (TID) | ORAL | 3 refills | Status: DC

## 2018-11-28 NOTE — Assessment & Plan Note (Signed)
Unclear if the pain is related to SI joint or glute med. Possible to have a low back component as well. 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  - duexis  - counseled on HEP and supportive care - if no improvement can consider SI joint injection, imaging, or GT injection.

## 2018-11-28 NOTE — Progress Notes (Signed)
Lisa MulletCathryn D Lee - 58 y.o. female MRN [id number]  Date of birth: 10/23/1960  SUBJECTIVE:  Including CC & ROS.  No chief complaint on file.   Lisa Lee is a 58 y.o. female that is presenting with left gluteal pain.  The pain is been slowly worsening.  She has felt the pain when she is transitioning from a seated to standing position.  She also feels the pain when she was performing hip abduction exercises.  The pain seems to stay located in the left gluteus with no radicular symptoms.  The pain can range from moderate to severe.  No improvement with modalities to date.  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  No history of surgery.  No history of similar pain.  Pain can be sharp and stabbing.    Review of Systems  Constitutional: Negative for fever.  HENT: Negative for congestion.   Respiratory: Negative for cough.   Cardiovascular: Negative for chest pain.  Gastrointestinal: Negative for abdominal pain.  Musculoskeletal: Positive for gait problem.  Skin: Negative for color change.  Neurological: Negative for weakness.  Hematological: Negative for adenopathy.    HISTORY: Past Medical, Surgical, Social, and Family History Reviewed & Updated per EMR.   Pertinent Historical Findings include:  Past Medical History:  Diagnosis Date  . Acid reflux   . Allergic rhinitis   . Asthma   . Depression   . Diarrhea 08/09/2011   Poss ibs  Vs other  Related to stress  consdier other eval if needed. Had colonoscoopy per dr Matthias HughsBuccini   . Hemorrhoids   . High cholesterol   . 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 Hypothyroidism   . Medication side effect 11/01/2011   tussionex   distrubed sleep   . Thyroid disease     Past Surgical History:  Procedure Laterality Date  . CERVICAL CONIZATION W/BX N/A 07/21/2015   Procedure: CONIZATION CERVIX WITH BIOPSY;  Surgeon: Kirkland HunArthur Stringer, MD;  Location: [facility];  Service: Gynecology;   Laterality: N/A;  . CERVICAL CONIZATION W/BX N/A 09/25/2018   Procedure: CONIZATION CERVIX WITH BIOPSY;  Surgeon: Adolphus Birchwoodossi, Emma, MD;  [facility];  Service: Gynecology;  Laterality: N/A;  . CERVICAL CRYOTHERAPY  1984  . COLPOSCOPY  08/26/13  . DILATION AND CURETTAGE, DIAGNOSTIC / THERAPEUTIC  1994   Blighted Ovum  . HYSTEROSCOPY     uterine polypectomy Jan 7th  . HYSTEROSCOPY WITH RESECTOSCOPE  08/04/2009   Removed polyp & IUD  . LYMPH NODE BIOPSY    . TONSILLECTOMY      Allergies  Allergen Reactions  . Penicillins Hives and Shortness Of Breath    Has patient had a PCN reaction causing immediate rash, facial/tongue/throat swelling, SOB or lightheadedness with hypotension: Yes Has patient had a PCN reaction causing severe rash involving mucus membranes or skin necrosis: No Has patient had a PCN reaction that required hospitalization No Has patient had a PCN reaction occurring within the last 10 years: No If all of the above answers are "NO", then may proceed with Cephalosporin use.   . Atorvastatin Other (See Comments)    Causes lower extremity muscle aches  . Pravastatin Other (See Comments)    Cause muscle aches and cramps  . Rosuvastatin Other (See Comments)    Caused pain in hands, shoulders, back, and indigestion    Family History  Problem Relation Age of Onset  .  Heart disease Sister 29       cabg stent  . Arthritis Mother   . Heart disease Mother   . Heart disease Father   . Colon cancer Father   . Parkinson's disease Father   . Pulmonary embolism Daughter   . Heart disease Sister   . Thyroid disease Sister   . Heart disease Brother   . Thyroid disease Daughter   . Rectal cancer Neg Hx      Social History   Socioeconomic History  . Marital status: Married    Spouse name: Not on file  . Number of children: Not on file  . Years of education: Not on file  . Highest education level: Not on file  Occupational History  . Not on file   Social Needs  . Financial resource strain: Not on file  . Food insecurity:    Worry: Not on file    Inability: Not on file  . Transportation needs:    Medical: Not on file    Non-medical: Not on file  Tobacco Use  . Smoking status: Never Smoker  . Smokeless tobacco: Never Used  Substance and Sexual Activity  . Alcohol use: Yes    Alcohol/week: 0.0 standard drinks    Comment: rare  . Drug use: No  . Sexual activity: Yes    Birth control/protection: None  Lifestyle  . Physical activity:    Days per week: Not on file    Minutes per session: Not on file  . Stress: Not on file  Relationships  . Social connections:    Talks on phone: Not on file    Gets together: Not on file    Attends religious service: Not on file    Active member of club or organization: Not on file    Attends meetings of clubs or organizations: Not on file    Relationship status: Not on file  . Intimate partner violence:    Fear of current or ex partner: Not on file    Emotionally abused: Not on file    Physically abused: Not on file    Forced sexual activity: Not on file  Other Topics Concern  . Not on file  Social History Narrative    And [employer]. 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    Divorced   Regular exercise-  Not as much recently    [facility] of [facility]   Pets 2 cats 1 dog to move   Daughter  On recovery heroin     PHYSICAL EXAM:  VS: BP 132/72   Pulse 98   Resp 16   Wt 134 lb (60.8 kg)   LMP 12/09/2013   SpO2 96%   BMI 24.91 kg/m  Physical Exam Gen: NAD, alert, cooperative with exam, well-appearing ENT: normal lips, normal nasal mucosa,  Eye: normal EOM, normal conjunctiva and lids CV:  no edema, +2 pedal pulses   Resp: no accessory muscle use, non-labored,  Skin: no rashes, no areas of induration  Neuro: normal tone, normal sensation to touch Psych:  normal insight, alert and oriented MSK:  Left hip/back:  Mild tenderness to palpation over the SI  joint, Greater trochanter, and piriformis  Normal IR and ER  No TTP of the lumbar spine  Normal strength hip flexion, knee flexion and extension, plantarflexion and dorsal flexion  Negative SLR  Neurovascularly intact      ASSESSMENT & PLAN:   Gluteal pain Unclear if the  pain is related to SI joint or glute med. Possible to have a low back component as well. 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  - duexis  - counseled on HEP and supportive care - if no improvement can consider SI joint injection, imaging, or GT injection.

## 2018-11-28 NOTE — Patient Instructions (Signed)
Nice to meet you  Please try heat or ice on the area Please try the exercises  Please try holding the child in different ways Please send me a message on MyChart with any questions or updates.

## 2018-12-12 ENCOUNTER — Ambulatory Visit: Payer: 59 | Admitting: Family Medicine

## 2018-12-31 ENCOUNTER — Encounter: Payer: Self-pay | Admitting: Family Medicine

## 2019-01-02 ENCOUNTER — Other Ambulatory Visit: Payer: Self-pay

## 2019-01-02 ENCOUNTER — Ambulatory Visit (INDEPENDENT_AMBULATORY_CARE_PROVIDER_SITE_OTHER): Payer: 59 | Admitting: Family Medicine

## 2019-01-02 ENCOUNTER — Encounter: Payer: Self-pay | Admitting: Family Medicine

## 2019-01-02 ENCOUNTER — Ambulatory Visit: Payer: Self-pay

## 2019-01-02 VITALS — BP 124/70 | HR 71 | Resp 16

## 2019-01-02 DIAGNOSIS — M7062 Trochanteric bursitis, left hip: Secondary | ICD-10-CM

## 2019-01-02 NOTE — Progress Notes (Signed)
Lisa Lee - 58 y.o. female MRN [id number]  Date of birth: 06/30/1961  SUBJECTIVE:  Including CC & ROS.  No chief complaint on file.   Lisa Lee is a 58 y.o. female that is  Presenting with worsening of his left hip pain. The pain is worse with walking and bending over. The pain is severe and seems to be located over the greater troch and lateral left thigh. No improvement with medications and exercises to date. Pain is sharp.    Review of Systems  Constitutional: Negative for fever.  HENT: Negative for congestion.   Respiratory: Negative for cough.   Cardiovascular: Negative for chest pain.  Gastrointestinal: Negative for abdominal pain.  Musculoskeletal: Positive for gait problem.  Skin: Negative for color change.  Neurological: Negative for weakness.  Hematological: Negative for adenopathy.    HISTORY: Past Medical, Surgical, Social, and Family History Reviewed & Updated per EMR.   Pertinent Historical Findings include:  Past Medical History:  Diagnosis Date  . Acid reflux   . Allergic rhinitis   . Asthma   . Depression   . Diarrhea 08/09/2011   Poss ibs  Vs other  Related to stress  consdier other eval if needed. Had colonoscoopy per dr Matthias Hughs   . Hemorrhoids   . High cholesterol   . 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 Hypothyroidism   . Medication side effect 11/01/2011   tussionex   distrubed sleep   . Thyroid disease     Past Surgical History:  Procedure Laterality Date  . CERVICAL CONIZATION W/BX N/A 07/21/2015   Procedure: CONIZATION CERVIX WITH BIOPSY;  Surgeon: Kirkland Hun, MD;  Location: [facility];  Service: Gynecology;  Laterality: N/A;  . CERVICAL CONIZATION W/BX N/A 09/25/2018   Procedure: CONIZATION CERVIX WITH BIOPSY;  Surgeon: Adolphus Birchwood, MD;  Location: [facility];  Service: Gynecology;  Laterality: N/A;  . CERVICAL CRYOTHERAPY  1984  . COLPOSCOPY  08/26/13  . DILATION AND CURETTAGE, DIAGNOSTIC / THERAPEUTIC  1994   Blighted  Ovum  . HYSTEROSCOPY     uterine polypectomy Jan 7th  . HYSTEROSCOPY WITH RESECTOSCOPE  08/04/2009   Removed polyp & IUD  . LYMPH NODE BIOPSY    . TONSILLECTOMY      Allergies  Allergen Reactions  . Penicillins Hives and Shortness Of Breath    Has patient had a PCN reaction causing immediate rash, facial/tongue/throat swelling, SOB or lightheadedness with hypotension: Yes Has patient had a PCN reaction causing severe rash involving mucus membranes or skin necrosis: No Has patient had a PCN reaction that required hospitalization No Has patient had a PCN reaction occurring within the last 10 years: No If all of the above answers are "NO", then may proceed with Cephalosporin use.   . Atorvastatin Other (See Comments)    Causes lower extremity muscle aches  . Pravastatin Other (See Comments)    Cause muscle aches and cramps  . Rosuvastatin Other (See Comments)    Caused pain in hands, shoulders, back, and indigestion    Family History  Problem Relation Age of Onset  . Heart disease Sister 62       cabg stent  . Arthritis Mother   . Heart disease Mother   . Heart disease Father   . Colon cancer Father   . Parkinson's disease Father   . Pulmonary embolism Daughter   . Heart disease Sister   . Thyroid disease Sister   . Heart disease Brother   .  Thyroid disease Daughter   . Rectal cancer Neg Hx      Social History   Socioeconomic History  . Marital status: Married    Spouse name: Not on file  . Number of children: Not on file  . Years of education: Not on file  . Highest education level: Not on file  Occupational History  . Not on file  Social Needs  . Financial resource strain: Not on file  . Food insecurity:    Worry: Not on file    Inability: Not on file  . Transportation needs:    Medical: Not on file    Non-medical: Not on file  Tobacco Use  . Smoking status: Never Smoker  . Smokeless tobacco: Never Used  Substance and Sexual Activity  . Alcohol use: Yes     Alcohol/week: 0.0 standard drinks    Comment: rare  . Drug use: No  . Sexual activity: Yes    Birth control/protection: None  Lifestyle  . Physical activity:    Days per week: Not on file    Minutes per session: Not on file  . Stress: Not on file  Relationships  . Social connections:    Talks on phone: Not on file    Gets together: Not on file    Attends religious service: Not on file    Active member of club or organization: Not on file    Attends meetings of clubs or organizations: Not on file    Relationship status: Not on file  . Intimate partner violence:    Fear of current or ex partner: Not on file    Emotionally abused: Not on file    Physically abused: Not on file    Forced sexual activity: Not on file  Other Topics Concern  . Not on file  Social History Narrative    [employer]. 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    Divorced   Regular exercise-  Not as much recently    [facility]   Pets 2 cats 1 dog to move   Daughter  On recovery heroin     PHYSICAL EXAM:  VS: BP 124/70   Pulse 71   Resp 16   LMP 12/09/2013   SpO2 98%  Physical Exam Gen: NAD, alert, cooperative with exam, well-appearing ENT: normal lips, normal nasal mucosa,  Eye: normal EOM, normal conjunctiva and lids CV:  no edema, +2 pedal pulses   Resp: no accessory muscle use, non-labored,  Skin: no rashes, no areas of induration  Neuro: normal tone, normal sensation to touch Psych:  normal insight, alert and oriented MSK:  Left Hip:  TTP over the GT  Normal IR and ER  Normal strength to resistance to hip flexion Negative SLR  No TTP over the SI joint  Normal FADIR and FABER  Neurovascularly intact    Aspiration/Injection Procedure Note Lisa Lee 09/12/1960  Procedure: Injection Indications: left hip pain   Procedure Details Consent: Risks of procedure as well as the alternatives and risks of each were explained to the  (patient/caregiver).  Consent for procedure obtained. Time Out: Verified patient identification, verified procedure, site/side was marked, verified correct patient position, special equipment/implants available, medications/allergies/relevent history reviewed, required imaging and test results available.  Performed.  The area was cleaned with iodine and alcohol swabs.    The left greater troch bursa was injected using 1 cc's of 40 mg depo-medrol and 3 cc's of  0.5% bupivicaine with a 22 3 1/2" needle.  Ultrasound was used. Images were obtained in trans views showing the injection.     A sterile dressing was applied.  Patient did tolerate procedure well.    ASSESSMENT & PLAN:   Greater trochanteric bursitis Pain hasn't improved to date and is exacerbating. Possible for radicular symptoms.  - GT injection  - referral to physical therapy  - counseled on supportive care - consider imaging.

## 2019-01-02 NOTE — Patient Instructions (Signed)
Good to see you Please try ice on the area  You will get a call to set up physical therapy  Please send me a message in MyChart with any questions or updates.  Please see me back in 4 weeks.   --Dr. [name] Likes

## 2019-01-05 ENCOUNTER — Encounter: Payer: Self-pay | Admitting: Family Medicine

## 2019-01-05 NOTE — Assessment & Plan Note (Signed)
Pain hasn't improved to date and is exacerbating. Possible for radicular symptoms.  - GT injection  - referral to physical therapy  - counseled on supportive care - consider imaging.

## 2019-01-07 ENCOUNTER — Other Ambulatory Visit: Payer: Self-pay

## 2019-01-07 ENCOUNTER — Ambulatory Visit: Payer: 59 | Attending: Family Medicine

## 2019-01-07 ENCOUNTER — Other Ambulatory Visit: Payer: Self-pay | Admitting: Family Medicine

## 2019-01-07 DIAGNOSIS — M7918 Myalgia, other site: Secondary | ICD-10-CM

## 2019-01-07 DIAGNOSIS — M25552 Pain in left hip: Secondary | ICD-10-CM | POA: Insufficient documentation

## 2019-01-07 DIAGNOSIS — R252 Cramp and spasm: Secondary | ICD-10-CM | POA: Diagnosis present

## 2019-01-07 DIAGNOSIS — R2689 Other abnormalities of gait and mobility: Secondary | ICD-10-CM | POA: Diagnosis present

## 2019-01-07 DIAGNOSIS — M7062 Trochanteric bursitis, left hip: Secondary | ICD-10-CM

## 2019-01-07 NOTE — Therapy (Signed)
[facility] [address] [place], [place], [address] Phone: [phone]   Fax:  [phone]  Physical Therapy Evaluation  Patient Details  Name: Lisa Lee MRN: [id number] Date of Birth: 10/15/1960 Referring Provider (PT): Clare GandySchmitz, Jeremy, MD   Encounter Date: 01/07/2019  PT End of Session - 01/07/19 0827    Visit Number  1    Date for PT Re-Evaluation  03/04/19    PT Start Time  0731    PT Stop Time  0820    PT Time Calculation (min)  49 min    Activity Tolerance  Patient tolerated treatment well    Behavior During Therapy  [facility] for tasks assessed/performed       Past Medical History:  Diagnosis Date  . Acid reflux   . Allergic rhinitis   . Asthma   . Depression   . Diarrhea 08/09/2011   Poss ibs  Vs other  Related to stress  consdier other eval if needed. Had colonoscoopy per dr Matthias HughsBuccini   . Hemorrhoids   . High cholesterol   . HPV in female [id number]  . Hypothyroidism   . Medication side effect 11/01/2011   tussionex   distrubed sleep   . Thyroid disease     Past Surgical History:  Procedure Laterality Date  . CERVICAL CONIZATION W/BX N/A 07/21/2015   Procedure: CONIZATION CERVIX WITH BIOPSY;  Surgeon: Kirkland HunArthur Stringer, MD;  Location: [facility];  Service: Gynecology;  Laterality: N/A;  . CERVICAL CONIZATION W/BX N/A 09/25/2018   Procedure: CONIZATION CERVIX WITH BIOPSY;  Surgeon: Adolphus Birchwoodossi, Emma, MD;  [facility] [place];  Service: Gynecology;  Laterality: N/A;  . CERVICAL CRYOTHERAPY  1984  . COLPOSCOPY  08/26/13  . DILATION AND CURETTAGE, DIAGNOSTIC / THERAPEUTIC  1994   Blighted Ovum  . HYSTEROSCOPY     uterine polypectomy Jan 7th  . HYSTEROSCOPY WITH RESECTOSCOPE  08/04/2009   Removed polyp & IUD  . LYMPH NODE BIOPSY    . TONSILLECTOMY      There were no vitals filed for this visit.   Subjective Assessment - 01/07/19 0731    Subjective  Pt presents to PT with complaints of Lt  lateral hip pain.  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  She started to notice the pain with work tasks.  Pt saw MD and he gave her exercises without relief.  Pt received injection into Lt trochanteric bursa witthout relief.  Pt now has pain down the Lt LE.      Pertinent History  injection into Lt trochanteric bursa: 01/02/19    Limitations  Walking;Sitting    How long can you sit comfortably?  5-10 minutes    How long can you walk comfortably?  antalgic, unsteady    Diagnostic tests  none    Patient Stated Goals  be pain free    Currently in Pain?  Yes    Pain Score  4    0-8/10   Pain Location  Leg    Pain Orientation  Left    Pain Descriptors / Indicators  Shooting;Sharp    Pain Type  Chronic pain    Pain Onset  More than a month ago    Pain Frequency  Intermittent    Aggravating Factors   certain movements, getting up from seated position, carrying child at work    Pain Relieving Factors  change of position,  stretching         [facility] PT Assessment - 01/07/19 0001      Assessment   Medical Diagnosis  Greater trochanteric bursitis of Lt hip    Referring Provider (PT)  Clare GandySchmitz, Jeremy, MD    Onset Date/Surgical Date  11/07/18    Next MD Visit  as needed    Prior Therapy  none      Precautions   Precautions  None      Restrictions   Weight Bearing Restrictions  No      Balance Screen   Has the patient fallen in the past 6 months  No    Has the patient had a decrease in activity level because of a fear of falling?   No    Is the patient reluctant to leave their home because of a fear of falling?   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    Leisure  walking, puzzles, gardening      Cognition   Overall Cognitive Status  Within Functional Limits for tasks assessed      Observation/Other Assessments   Focus on Therapeutic Outcomes (FOTO)   40% limitation      Posture/Postural Control   Posture/Postural Control  Postural limitations    Postural Limitations  Weight shift right      ROM / Strength   AROM / PROM / Strength  AROM;PROM;Strength      AROM   Overall AROM   Within functional limits for tasks performed    Overall AROM Comments  Lt gluteal pain with Lt hip motion      PROM   Overall PROM   Within functional limits for tasks performed    Overall PROM Comments  Lt gluteal pain with Lt hip flexion and IR      Strength   Overall Strength  Within functional limits for tasks performed      Palpation   Palpation comment  trigger points and pain over upper gluteals on the Lt, tender over Lt trochanteric bursa and proximal ITB      Transfers   Transfers  Sit to Stand;Stand to Sit    Sit to Stand  With upper extremity assist    Stand to Sit  Without upper extremity assist    Comments  weight shift to the Rt      Ambulation/Gait   Ambulation/Gait  Yes    Ambulation/Gait Assistance  6: Modified independent (Device/Increase time)    Gait Pattern  Decreased stride length;Antalgic    Ambulation Surface  Level                Objective measurements completed on examination: See above findings.        Trigger Point Dry Needling - 01/07/19 0001    Consent Given?  Yes    Education Handout Provided  Yes    Muscles Treated Back/Hip  Gluteus minimus;Gluteus medius   Lt only   Gluteus Minimus Response  Twitch  response elicited;Palpable increased muscle length    Gluteus Medius Response  Twitch response elicited;Palpable increased muscle length           PT Education - 01/07/19 0806    Education  Details   Access Code: [email or web address]     Person(s) Educated  Patient    Methods  Explanation;Handout;Demonstration    Comprehension  Verbalized understanding;Returned demonstration       PT Short Term Goals - 01/07/19 0929      PT SHORT TERM GOAL #1   Title  be independent in initial HEP    Time  4    Period  Weeks    Status  New    Target Date  02/04/19      PT SHORT TERM GOAL #2   Title  perform sit to stand with Rt=Lt weightbearing    Time  4    Period  Weeks    Status  New    Target Date  02/04/19      PT SHORT TERM GOAL #3   Title  report < or = to 5/10 Lt LE pain with standing and walking    Time  4    Period  Weeks    Status  New    Target Date  02/04/19        PT Long Term Goals - 01/07/19 0930      PT LONG TERM GOAL #1   Title  be independent in advanced HEP    Time  8    Period  Weeks    Status  New    Target Date  03/04/19      PT LONG TERM GOAL #2   Title  demonstrate symmetry with ambulation on level surface    Time  8    Period  Weeks    Status  New    Target Date  03/04/19      PT LONG TERM GOAL #3   Title  reduce FOTO to < or = to 25% limitation    Time  8    Period  Weeks    Status  New    Target Date  03/04/19      PT LONG TERM GOAL #4   Title  lift and carry laundry without increased Lt LE pain    Time  8    Period  Weeks    Status  New    Target Date  03/04/19      PT LONG TERM GOAL #5   Title  report a 70% reduction in Lt LE pain with care of child at work    Time  8    Period  Weeks    Status  New    Target Date  03/04/19             Plan - 01/07/19 [id number]    Clinical Impression Statement  Pt presents to PT with Lt lateral hip pain.  Pt had injection into Lt trochanteric bursa on 01/02/19.  Pt reports now that pain is in her upper gluteals and extends into the Lt LE.  Pt rates pain as 0-7/10 and pain increases with sit to stand transition, carrying the child that she cares for at work, carrying laundry and walking.  Pt  demonstrates painful Lt hip ROM and has palpable tenderness over Lt proximal gluteals and ITB.  Pt demonstrates Rt weight shift with transitional movements and antalgic gait.  Pt will benefit from skilled PT for manual therapy  to address gluteal and ITB trigger points, gait training, flexibility and strength.      Personal Factors and Comorbidities  Profession    Examination-Activity Limitations  Caring for Others;Carry;Stand;Locomotion Level    Examination-Participation Restrictions  Laundry    Stability/Clinical Decision Making  Evolving/Moderate complexity    Clinical Decision Making  Moderate    Rehab Potential  Excellent    PT Frequency  2x / week    PT Duration  8 weeks    PT Treatment/Interventions  ADLs/Self Care Home Management;Electrical Stimulation;Cryotherapy;Moist Heat;Stair training;Gait training;Therapeutic activities;Therapeutic exercise;Neuromuscular re-education;Patient/family education;Manual techniques;Passive range of motion;Dry needling;Taping    PT Next Visit Plan  assess response to dry needling, show trigger point release with tennis ball, flexibility, gait training     PT Home Exercise Plan  Access Code: [email or web address]     Consulted and Agree with Plan of Care  Patient       Patient will benefit from skilled therapeutic intervention in order to improve the following deficits and impairments:  Abnormal gait, Decreased activity tolerance, Decreased endurance, Decreased mobility, Difficulty walking, Increased muscle spasms, Pain  Visit Diagnosis: Pain in left hip - Plan: PT plan of care cert/re-cert  Cramp and spasm - Plan: PT plan of care cert/re-cert  Other abnormalities of gait and mobility - Plan: PT plan of care cert/re-cert     Problem List Patient Active Problem List   Diagnosis Date Noted  . Greater trochanteric bursitis 11/28/2018  . Atypical squamous cells cannot exclude high grade squamous intraepithelial lesion on cytologic smear of cervix (ASC-H)  09/12/2018  . ASCUS with positive high risk HPV cervical 09/12/2018  . Insomnia 06/17/2018  . 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 Abnormal cervical Papanicolaou smear 04/03/2017  . Atrophic vaginitis 04/03/2017  . Cervical intraepithelial neoplasia grade 1 04/03/2017  . Unspecified dyspareunia (CODE) 04/03/2017  . Genital herpes simplex 04/03/2017  . 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 Menopausal symptom 04/03/2017  . Skin lesion 04/03/2017  . Hyperlipidemia 10/29/2015  . Serum calcium elevated 10/26/2014  . Recurrent UTI 02/05/2014  . Upper airway cough syndrome 10/07/2013  . Uncomplicated asthma 15/17/6160  . Gastro-esophageal reflux disease without esophagitis 10/07/2013  . Loose stools 10/07/2013  . Thumb pain 11/21/2012  . History of depression 11/21/2012  . Medication management 11/21/2012  . Lithium use 09/03/2012  . Rosacea 06/02/2012  . Medication monitoring encounter 06/02/2012  . Depression 06/02/2012  . Perimenopausal 06/02/2012  . Major depressive disorder, single episode 06/02/2012  . Cardiovascular risk factor 08/28/2011  . FH: premature coronary heart disease 08/09/2011  . ESOPHAGEAL REFLUX 08/02/2010  . PLANTAR FASCIITIS, RIGHT 08/02/2010  . OTHER DISEASES OF NASAL CAVITY AND SINUSES 04/20/2010  . HYPERLIPIDEMIA 06/15/2009  . TENDINITIS, RIGHT KNEE 08/11/2008  . Allergic rhinitis 05/14/2007  . Hypothyroidism 03/03/2007  . ECZEMA, HANDS 03/03/2007  . POSTURAL LIGHTHEADEDNESS 03/03/2007  . DEPRESSION 02/27/2007  . Cough variant asthma  vs  Eos bronchitis 02/27/2007   Sigurd Sos, PT 01/07/19 9:34 AM  [facility] [address], [place], [place], [address] Phone: [phone]   Fax:  [phone]  Name: Lisa Lee MRN: [id number] Date of Birth: Jun 13, 1961

## 2019-01-07 NOTE — Patient Instructions (Addendum)
Trigger Point Dry Needling  . What is Trigger Point Dry Needling (DN)? o DN is a physical therapy technique used to treat muscle pain and dysfunction. Specifically, DN helps deactivate muscle trigger points (muscle knots).  o A thin filiform needle is used to penetrate the skin and stimulate the underlying trigger point. The goal is for a local twitch response (LTR) to occur and for the trigger point to relax. No medication of any kind is injected during the procedure.   . What Does Trigger Point Dry Needling Feel Like?  o The procedure feels different for each individual patient. Some patients report that they do not actually feel the needle enter the skin and overall the process is not painful. Very mild bleeding may occur. However, many patients feel a deep cramping in the muscle in which the needle was inserted. This is the local twitch response.   Marland Kitchen How Will I feel after the treatment? o Soreness is normal, and the onset of soreness may not occur for a few hours. Typically this soreness does not last longer than two days.  o Bruising is uncommon, however; ice can be used to decrease any possible bruising.  o In rare cases feeling tired or nauseous after the treatment is normal. In addition, your symptoms may get worse before they get better, this period will typically not last longer than 24 hours.   . What Can I do After My Treatment? o Increase your hydration by drinking more water for the next 24 hours. o You may place ice or heat on the areas treated that have become sore, however, do not use heat on inflamed or bruised areas. Heat often brings more relief post needling. o You can continue your regular activities, but vigorous activity is not recommended initially after the treatment for 24 hours. o DN is best combined with other physical therapy such as strengthening, stretching, and other therapies.   Access Code: [email or web address]  URL: [email or web address]  Date: 01/07/2019   Prepared by: Sigurd Sos   Exercises  Hooklying Single Knee to Chest - 3 reps - 1 sets - 20 hold - 3x daily - 7x weekly  Seated Hamstring Stretch - 3 reps - 1 sets - 20 hold - 3x daily - 7x weekly  Supine Piriformis Stretch with Leg Straight - 10 reps - 3 sets - 1x daily - 7x weekly  Seated Figure 4 Piriformis Stretch - 3 reps - 1 sets - 20 hold - 3x daily - 7x weekly    [facility] [address], [place], [place] [address] Phone # [phone] Fax [phone]

## 2019-01-09 ENCOUNTER — Other Ambulatory Visit: Payer: Self-pay | Admitting: Family Medicine

## 2019-01-09 ENCOUNTER — Ambulatory Visit (INDEPENDENT_AMBULATORY_CARE_PROVIDER_SITE_OTHER): Payer: 59

## 2019-01-09 ENCOUNTER — Other Ambulatory Visit: Payer: Self-pay

## 2019-01-09 ENCOUNTER — Ambulatory Visit: Payer: 59 | Admitting: Physical Therapy

## 2019-01-09 ENCOUNTER — Encounter: Payer: Self-pay | Admitting: Physical Therapy

## 2019-01-09 DIAGNOSIS — M7918 Myalgia, other site: Secondary | ICD-10-CM | POA: Diagnosis not present

## 2019-01-09 DIAGNOSIS — R2689 Other abnormalities of gait and mobility: Secondary | ICD-10-CM

## 2019-01-09 DIAGNOSIS — R252 Cramp and spasm: Secondary | ICD-10-CM

## 2019-01-09 DIAGNOSIS — M7062 Trochanteric bursitis, left hip: Secondary | ICD-10-CM | POA: Diagnosis not present

## 2019-01-09 DIAGNOSIS — M25552 Pain in left hip: Secondary | ICD-10-CM

## 2019-01-09 NOTE — Patient Instructions (Signed)
Access Code: [email or web address]  URL: [email or web address]  Date: 01/09/2019  Prepared by: Venetia Night Pahola Dimmitt   Exercises  Hooklying Single Knee to Chest - 3 reps - 1 sets - 20 hold - 3x daily - 7x weekly  Seated Hamstring Stretch - 3 reps - 1 sets - 20 hold - 3x daily - 7x weekly  Supine Piriformis Stretch with Leg Straight - 10 reps - 3 sets - 1x daily - 7x weekly  Seated Figure 4 Piriformis Stretch - 3 reps - 1 sets - 20 hold - 3x daily - 7x weekly  Sit to Stand - 10 reps - 3 sets - 1x daily - 7x weekly  Hip Flexor Stretch on Step - 3 reps - 2 sets - 20 hold - 1x daily - 7x weekly

## 2019-01-09 NOTE — Therapy (Signed)
[facility] [address], [address] [place], [place], [address] Phone: [phone]   Fax:  [phone]  Physical Therapy Treatment  Patient Details  Name: Lisa Lee MRN: [id number] Date of Birth: 05/24/1961 Referring Provider (PT): Clare GandySchmitz, Jeremy, MD   Encounter Date: 01/09/2019  PT End of Session - 01/09/19 1100    Visit Number  2    Date for PT Re-Evaluation  03/04/19    PT Start Time  1100    PT Stop Time  1145    PT Time Calculation (min)  45 min    Activity Tolerance  Patient tolerated treatment well    Behavior During Therapy  [facility] for tasks assessed/performed       Past Medical History:  Diagnosis Date  . Acid reflux   . Allergic rhinitis   . Asthma   . Depression   . Diarrhea 08/09/2011   Poss ibs  Vs other  Related to stress  consdier other eval if needed. Had colonoscoopy per dr Matthias HughsBuccini   . Hemorrhoids   . High cholesterol   . HPV in female 471984  . Hypothyroidism   . Medication side effect 11/01/2011   tussionex   distrubed sleep   . Thyroid disease     Past Surgical History:  Procedure Laterality Date  . CERVICAL CONIZATION W/BX N/A 07/21/2015   Procedure: CONIZATION CERVIX WITH BIOPSY;  Surgeon: Kirkland HunArthur Stringer, MD;  Location: [facility];  Service: Gynecology;  Laterality: N/A;  . CERVICAL CONIZATION W/BX N/A 09/25/2018   Procedure: CONIZATION CERVIX WITH BIOPSY;  Surgeon: Adolphus Birchwoodossi, Emma, MD;  [facility];  Service: Gynecology;  Laterality: N/A;  . CERVICAL CRYOTHERAPY  1984  . COLPOSCOPY  08/26/13  . DILATION AND CURETTAGE, DIAGNOSTIC / THERAPEUTIC  1994   Blighted Ovum  . HYSTEROSCOPY     uterine polypectomy Jan 7th  . HYSTEROSCOPY WITH RESECTOSCOPE  08/04/2009   Removed polyp & IUD  . LYMPH NODE BIOPSY    . TONSILLECTOMY      There were no vitals filed for this visit.  Subjective Assessment - 01/09/19 1100    Subjective  The dry needling did seem to help - maybe 25%  relief.  But then yesterday I had to hold my patient a lot and I could barely walk by the end of the day.    Pertinent History  injection into Lt trochanteric bursa: 01/02/19    Currently in Pain?  Yes    Pain Score  2     Pain Location  Hip    Pain Orientation  Left;Lateral    Pain Descriptors / Indicators  Sharp;Shooting    Pain Type  Chronic pain    Pain Onset  More than a month ago    Pain Frequency  Intermittent    Aggravating Factors   carrying/holding child at work                       [facility]/Exercise - 01/09/19 0001      Self-Care   Self-Care  Other Self-Care Comments    Other Self-Care Comments   self-release of Lt hip with tennis ball      Exercises   Exercises  Lumbar;Knee/Hip      Lumbar Exercises: Stretches   Active Hamstring Stretch  Left;Right;20 seconds;2 reps    Active Hamstring Stretch Limitations  seated    Figure 4 Stretch  2 reps;Seated;20 seconds  Figure 4 Stretch Limitations  bil      Lumbar Exercises: Seated   Sit to Stand  20 reps   PT gave VCs for core and hip hinge (HEP)   Sit to Stand Limitations  2x10 with mirror for equat weight bearing feedback      Knee/Hip Exercises: Stretches   Hip Flexor Stretch  30 seconds;2 reps;Left    Hip Flexor Stretch Limitations  passive by PT in Lt sidelying    Other Knee/Hip Stretches  hip flexor stretch on step (HEP), Lt only      Knee/Hip Exercises: Sidelying   Clams  15 reps with cues for PF and abdominals, A/ROM only             PT Education - 01/09/19 1143    Education Details  Access Code: [email or web address]    Person(s) Educated  Patient    Methods  Explanation;Demonstration;Verbal cues;Handout    Comprehension  Verbalized understanding       PT Short Term Goals - 01/09/19 1155      PT SHORT TERM GOAL #1   Title  be independent in initial HEP    Status  On-going      PT SHORT TERM GOAL #2   Title  perform sit to stand with Rt=Lt weightbearing    Status   On-going        PT Long Term Goals - 01/07/19 0930      PT LONG TERM GOAL #1   Title  be independent in advanced HEP    Time  8    Period  Weeks    Status  New    Target Date  03/04/19      PT LONG TERM GOAL #2   Title  demonstrate symmetry with ambulation on level surface    Time  8    Period  Weeks    Status  New    Target Date  03/04/19      PT LONG TERM GOAL #3   Title  reduce FOTO to < or = to 25% limitation    Time  8    Period  Weeks    Status  New    Target Date  03/04/19      PT LONG TERM GOAL #4   Title  lift and carry laundry without increased Lt LE pain    Time  8    Period  Weeks    Status  New    Target Date  03/04/19      PT LONG TERM GOAL #5   Title  report a 70% reduction in Lt LE pain with care of child at work    Time  8    Period  Weeks    Status  New    Target Date  03/04/19            Plan - 01/09/19 1151    Clinical Impression Statement  Pt reported 25% relief from DN last visit although work demands had her feeling she could barely walk by end of day.  PT focused on LE strength overlay with core cueing today and equal weight bearing through bil LEs for sit to stand with mirror for feedback.  Added hip flexor stretch and self-release of Lt buttock with tennis ball to HEP today.  Pt with fair core awareness in sidelying but struggled with it in standing.  She will continue to benefit from skilled PT to address pain and weakness surrounding core and Lt  hip.    Rehab Potential  Excellent    PT Frequency  2x / week    PT Duration  8 weeks    PT Treatment/Interventions  ADLs/Self Care Home Management;Electrical Stimulation;Cryotherapy;Moist Heat;Stair training;Gait training;Therapeutic activities;Therapeutic exercise;Neuromuscular re-education;Patient/family education;Manual techniques;Passive range of motion;Dry needling;Taping    PT Next Visit Plan  DN as needed Lt hip, continue flexibility, gait training, standing posture and core  recruitment, functional strategies for work tasks (lifting, bending, carrying)    PT Home Exercise Plan  Access Code: [email or web address]     Consulted and Agree with Plan of Care  Patient       Patient will benefit from skilled therapeutic intervention in order to improve the following deficits and impairments:     Visit Diagnosis: Pain in left hip  Cramp and spasm  Other abnormalities of gait and mobility     Problem List Patient Active Problem List   Diagnosis Date Noted  . Greater trochanteric bursitis 11/28/2018  . Atypical squamous cells cannot exclude high grade squamous intraepithelial lesion on cytologic smear of cervix (ASC-H) 09/12/2018  . ASCUS with positive high risk HPV cervical 09/12/2018  . Insomnia 06/17/2018  . 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 Abnormal cervical Papanicolaou smear 04/03/2017  . Atrophic vaginitis 04/03/2017  . Cervical intraepithelial neoplasia grade 1 04/03/2017  . Unspecified dyspareunia (CODE) 04/03/2017  . Genital herpes simplex 04/03/2017  . 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 Menopausal symptom 04/03/2017  . Skin lesion 04/03/2017  . Hyperlipidemia 10/29/2015  . Serum calcium elevated 10/26/2014  . Recurrent UTI 02/05/2014  . Upper airway cough syndrome 10/07/2013  . Uncomplicated asthma 46/56/8127  . Gastro-esophageal reflux disease without esophagitis 10/07/2013  . Loose stools 10/07/2013  . Thumb pain 11/21/2012  . History of depression 11/21/2012  . Medication management 11/21/2012  . Lithium use 09/03/2012  . Rosacea 06/02/2012  . Medication monitoring encounter 06/02/2012  . Depression 06/02/2012  . Perimenopausal 06/02/2012  . Major depressive disorder, single episode 06/02/2012  . Cardiovascular risk factor 08/28/2011  . FH: premature coronary heart disease 08/09/2011  . ESOPHAGEAL REFLUX 08/02/2010  . PLANTAR FASCIITIS, RIGHT 08/02/2010  . OTHER DISEASES OF NASAL CAVITY AND  SINUSES 04/20/2010  . HYPERLIPIDEMIA 06/15/2009  . TENDINITIS, RIGHT KNEE 08/11/2008  . Allergic rhinitis 05/14/2007  . Hypothyroidism 03/03/2007  . ECZEMA, HANDS 03/03/2007  . POSTURAL LIGHTHEADEDNESS 03/03/2007  . DEPRESSION 02/27/2007  . Cough variant asthma  vs  Eos bronchitis 02/27/2007    Baruch Merl, PT 01/09/19 11:58 AM   [facility] [address], [place], [place], [address] Phone: [phone]   Fax:  [phone]  Name: Lisa Lee MRN: [id number] Date of Birth: [birth year]

## 2019-01-13 ENCOUNTER — Other Ambulatory Visit: Payer: Self-pay | Admitting: Family Medicine

## 2019-01-13 ENCOUNTER — Other Ambulatory Visit: Payer: Self-pay | Admitting: Internal Medicine

## 2019-01-13 ENCOUNTER — Telehealth: Payer: Self-pay | Admitting: Family Medicine

## 2019-01-13 MED ORDER — GABAPENTIN 100 MG PO CAPS: 100.0000 mg | ORAL_CAPSULE | Freq: Three times a day (TID) | ORAL | 3 refills | Status: DC

## 2019-01-13 NOTE — Progress Notes (Signed)
Spoke with patient about results and sent in gabapentin.   Rosemarie Ax, MD [facility] 01/13/2019, 11:37 AM

## 2019-01-13 NOTE — Telephone Encounter (Signed)
Left VM for patient. If she calls back please have her speak with a nurse/CMA and inform that her lumbar spine xray shows degenerative changes of the facet joints. The pelvic xray looks good. The [facility] can report results to patient.   If any questions then please take the best time and phone number to call and I will try to call her back.   Rosemarie Ax, MD [facility] 01/13/2019, 8:31 AM

## 2019-01-14 ENCOUNTER — Other Ambulatory Visit: Payer: Self-pay

## 2019-01-14 ENCOUNTER — Ambulatory Visit: Payer: 59

## 2019-01-14 DIAGNOSIS — R252 Cramp and spasm: Secondary | ICD-10-CM

## 2019-01-14 DIAGNOSIS — R2689 Other abnormalities of gait and mobility: Secondary | ICD-10-CM

## 2019-01-14 DIAGNOSIS — M25552 Pain in left hip: Secondary | ICD-10-CM

## 2019-01-14 NOTE — Therapy (Signed)
[facility] [address], [address] [place], [place], [address] Phone: [phone]   Fax:  [phone]  Physical Therapy Treatment  Patient Details  Name: Lisa Lee MRN: [id number] Date of Birth: 02/13/1961 Referring Provider (PT): Clare GandySchmitz, Jeremy, MD   Encounter Date: 01/14/2019  PT End of Session - 01/14/19 0931    Visit Number  3    Date for PT Re-Evaluation  03/04/19    PT Start Time  0835    PT Stop Time  0922    PT Time Calculation (min)  47 min    Activity Tolerance  Patient tolerated treatment well    Behavior During Therapy  [facility] for tasks assessed/performed       Past Medical History:  Diagnosis Date  . Acid reflux   . Allergic rhinitis   . Asthma   . Depression   . Diarrhea 08/09/2011   Poss ibs  Vs other  Related to stress  consdier other eval if needed. Had colonoscoopy per dr Matthias HughsBuccini   . Hemorrhoids   . High cholesterol   . HPV in female 151984  . Hypothyroidism   . Medication side effect 11/01/2011   tussionex   distrubed sleep   . Thyroid disease     Past Surgical History:  Procedure Laterality Date  . CERVICAL CONIZATION W/BX N/A 07/21/2015   Procedure: CONIZATION CERVIX WITH BIOPSY;  Surgeon: Kirkland HunArthur Stringer, MD;  Location: [facility];  Service: Gynecology;  Laterality: N/A;  . CERVICAL CONIZATION W/BX N/A 09/25/2018   Procedure: CONIZATION CERVIX WITH BIOPSY;  Surgeon: Adolphus Birchwoodossi, Emma, MD;  [facility] ;  Service: Gynecology;  Laterality: N/A;  . CERVICAL CRYOTHERAPY  1984  . COLPOSCOPY  08/26/13  . DILATION AND CURETTAGE, DIAGNOSTIC / THERAPEUTIC  1994   Blighted Ovum  . HYSTEROSCOPY     uterine polypectomy Jan 7th  . HYSTEROSCOPY WITH RESECTOSCOPE  08/04/2009   Removed polyp & IUD  . LYMPH NODE BIOPSY    . TONSILLECTOMY      There were no vitals filed for this visit.  Subjective Assessment - 01/14/19 0838    Subjective  I am having more nerve pain to my toes on the  Lt.  I feel like the exercises are triggering the pain.    Pertinent History  injection into Lt trochanteric bursa: 01/02/19    Currently in Pain?  Yes    Pain Location  Buttocks    Pain Orientation  Left    Pain Descriptors / Indicators  Sharp    Pain Type  Chronic pain    Pain Radiating Towards  Lt foot    Pain Onset  More than a month ago    Pain Frequency  Intermittent    Aggravating Factors   carrying/holding child at work    Pain Relieving Factors  change of position, gluteal release                       [facility] Adult PT Treatment/Exercise - 01/14/19 0001      Lumbar Exercises: Seated   Sit to Stand  20 reps   PT gave VCs for core and hip hinge      Knee/Hip Exercises: Aerobic   Nustep  Level 2 x 6 minutes    PT present to discuss progress     Knee/Hip Exercises: Sidelying   Clams  15 reps with cues for PF and abdominals, A/ROM only  Manual Therapy   Manual Therapy  Soft tissue mobilization;Myofascial release    Manual therapy comments  elongation with trigger point release to bil lumbar paraspinals and Lt gluteals       Trigger Point Dry Needling - 01/14/19 0001    Consent Given?  Yes    Muscles Treated Back/Hip  Gluteus minimus;Gluteus medius;Lumbar multifidi   Lt only gluteals, bil lumbar multifidi   Gluteus Minimus Response  Twitch response elicited;Palpable increased muscle length    Gluteus Medius Response  Twitch response elicited;Palpable increased muscle length    Lumbar multifidi Response  Twitch response elicited;Palpable increased muscle length           PT Education - 01/14/19 0901    Education Details  Access Code: [email or web address]    Person(s) Educated  Patient    Methods  Explanation;Demonstration;Handout    Comprehension  Verbalized understanding;Returned demonstration       PT Short Term Goals - 01/14/19 0929      PT SHORT TERM GOAL #1   Title  be independent in initial HEP    Status  Achieved        PT Long Term Goals -  01/07/19 0930      PT LONG TERM GOAL #1   Title  be independent in advanced HEP    Time  8    Period  Weeks    Status  New    Target Date  03/04/19      PT LONG TERM GOAL #2   Title  demonstrate symmetry with ambulation on level surface    Time  8    Period  Weeks    Status  New    Target Date  03/04/19      PT LONG TERM GOAL #3   Title  reduce FOTO to < or = to 25% limitation    Time  8    Period  Weeks    Status  New    Target Date  03/04/19      PT LONG TERM GOAL #4   Title  lift and carry laundry without increased Lt LE pain    Time  8    Period  Weeks    Status  New    Target Date  03/04/19      PT LONG TERM GOAL #5   Title  report a 70% reduction in Lt LE pain with care of child at work    Time  8    Period  Weeks    Status  New    Target Date  03/04/19            Plan - 01/14/19 0927    Clinical Impression Statement  Pt has had increase in Lt LE radiculopathy over the past few days.  MD has advised pt to increase Gabapentin to help to resolve.  PT advised pt to eliminate flexion based HEP for now and PT issued extension based exercise to see if this resolves the symptoms.  Pt with tension in Lt>Rt lumbar multifidi and Lt gluteals with reduced tension and trigger points after dry needling today.  Pt with improvement in tissue mobility in the Lt gluteals as compared to evaluation.  PT will check on symptoms with new HEP regimen to determine if this impacts her leg pain.  Pt will continue to benefit from skilled PT for core/gluteal strength, manual therapy to lumbar spine and Lt gluteals and to address Lt LE radiculopathy/gluteal pain.    PT  Frequency  2x / week    PT Duration  8 weeks    PT Treatment/Interventions  ADLs/Self Care Home Management;Electrical Stimulation;Cryotherapy;Moist Heat;Stair training;Gait training;Therapeutic activities;Therapeutic exercise;Neuromuscular re-education;Patient/family education;Manual techniques;Passive range of motion;Dry  needling;Taping    PT Next Visit Plan  assess response to dry needling, determine if pt responded well to extension based exercises and elimination of hip stretches.  Work on Education officer, museum and address modifications for work (lifting, carrying, bending).    PT Home Exercise Plan  Access Code: [email or web address]     Recommended Other Services  initial cert is signed    Consulted and Agree with Plan of Care  Patient       Patient will benefit from skilled therapeutic intervention in order to improve the following deficits and impairments:  Abnormal gait, Decreased activity tolerance, Decreased endurance, Decreased mobility, Difficulty walking, Increased muscle spasms, Pain  Visit Diagnosis: 1. Other abnormalities of gait and mobility   2. Cramp and spasm   3. Pain in left hip        Problem List Patient Active Problem List   Diagnosis Date Noted  . Greater trochanteric bursitis 11/28/2018  . Atypical squamous cells cannot exclude high grade squamous intraepithelial lesion on cytologic smear of cervix (ASC-H) 09/12/2018  . ASCUS with positive high risk HPV cervical 09/12/2018  . Insomnia 06/17/2018  . 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 Abnormal cervical Papanicolaou smear 04/03/2017  . Atrophic vaginitis 04/03/2017  . Cervical intraepithelial neoplasia grade 1 04/03/2017  . Unspecified dyspareunia (CODE) 04/03/2017  . Genital herpes simplex 04/03/2017  . 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 Menopausal symptom 04/03/2017  . Skin lesion 04/03/2017  . Hyperlipidemia 10/29/2015  . Serum calcium elevated 10/26/2014  . Recurrent UTI 02/05/2014  . Upper airway cough syndrome 10/07/2013  . Uncomplicated asthma 31/49/7026  . Gastro-esophageal reflux disease without esophagitis 10/07/2013  . Loose stools 10/07/2013  . Thumb pain 11/21/2012  . History of depression 11/21/2012  . Medication management 11/21/2012  . Lithium use 09/03/2012  .  Rosacea 06/02/2012  . Medication monitoring encounter 06/02/2012  . Depression 06/02/2012  . Perimenopausal 06/02/2012  . Major depressive disorder, single episode 06/02/2012  . Cardiovascular risk factor 08/28/2011  . FH: premature coronary heart disease 08/09/2011  . ESOPHAGEAL REFLUX 08/02/2010  . PLANTAR FASCIITIS, RIGHT 08/02/2010  . OTHER DISEASES OF NASAL CAVITY AND SINUSES 04/20/2010  . HYPERLIPIDEMIA 06/15/2009  . TENDINITIS, RIGHT KNEE 08/11/2008  . Allergic rhinitis 05/14/2007  . Hypothyroidism 03/03/2007  . ECZEMA, HANDS 03/03/2007  . POSTURAL LIGHTHEADEDNESS 03/03/2007  . DEPRESSION 02/27/2007  . Cough variant asthma  vs  Eos bronchitis 02/27/2007    Sigurd Sos, PT 01/14/19 9:32 AM  [facility] [address] [place], [place], [address] Phone: [phone]   Fax:  [phone]  Name: Lisa Lee MRN: [id number] Date of Birth: 1961/06/08

## 2019-01-14 NOTE — Patient Instructions (Signed)
Access Code: [email or web address]  URL: [email or web address]  Date: 01/14/2019  Prepared by: Sigurd Sos   Exercises    Clamshell - 10 reps - 2 sets - 2x daily - 7x weekly  Standing Lumbar Extension - 10 reps - 10 hold - 5x daily - 7x weekly  Prone Press Up - 10 reps - 5 hold - 5x daily - 7x weekly  Static Prone on Elbows - 5x daily - 7x weekly

## 2019-01-16 ENCOUNTER — Encounter: Payer: Self-pay | Admitting: Physical Therapy

## 2019-01-16 ENCOUNTER — Ambulatory Visit: Payer: 59 | Admitting: Physical Therapy

## 2019-01-16 ENCOUNTER — Other Ambulatory Visit: Payer: Self-pay

## 2019-01-16 DIAGNOSIS — M25552 Pain in left hip: Secondary | ICD-10-CM

## 2019-01-16 DIAGNOSIS — R252 Cramp and spasm: Secondary | ICD-10-CM

## 2019-01-16 DIAGNOSIS — R2689 Other abnormalities of gait and mobility: Secondary | ICD-10-CM

## 2019-01-16 NOTE — Therapy (Signed)
[facility] [address], [address] [place], [place], [address] Phone: [phone]   Fax:  [phone]  Physical Therapy Treatment  Patient Details  Name: Lisa Lee MRN: [id number] Date of Birth: 12/07/1960 Referring Provider (PT): Clare GandySchmitz, Jeremy, MD   Encounter Date: 01/16/2019  PT End of Session - 01/16/19 1004    Visit Number  4    Date for PT Re-Evaluation  03/04/19    PT Start Time  1000    PT Stop Time  1042    PT Time Calculation (min)  42 min    Activity Tolerance  Patient tolerated treatment well;Patient limited by pain    Behavior During Therapy  [facility] for tasks assessed/performed       Past Medical History:  Diagnosis Date  . Acid reflux   . Allergic rhinitis   . Asthma   . Depression   . Diarrhea 08/09/2011   Poss ibs  Vs other  Related to stress  consdier other eval if needed. Had colonoscoopy per dr Matthias HughsBuccini   . Hemorrhoids   . High cholesterol   . 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 Hypothyroidism   . Medication side effect 11/01/2011   tussionex   distrubed sleep   . Thyroid disease     Past Surgical History:  Procedure Laterality Date  . CERVICAL CONIZATION W/BX N/A 07/21/2015   Procedure: CONIZATION CERVIX WITH BIOPSY;  Surgeon: Kirkland HunArthur Stringer, MD;  Location: [facility];  Service: Gynecology;  Laterality: N/A;  . CERVICAL CONIZATION W/BX N/A 09/25/2018   Procedure: CONIZATION CERVIX WITH BIOPSY;  Surgeon: Adolphus Birchwoodossi, Emma, MD;  [facility];  Service: Gynecology;  Laterality: N/A;  . CERVICAL CRYOTHERAPY  1984  . COLPOSCOPY  08/26/13  . DILATION AND CURETTAGE, DIAGNOSTIC / THERAPEUTIC  1994   Blighted Ovum  . HYSTEROSCOPY     uterine polypectomy Jan 7th  . HYSTEROSCOPY WITH RESECTOSCOPE  08/04/2009   Removed polyp & IUD  . LYMPH NODE BIOPSY    . TONSILLECTOMY      There were no vitals filed for this visit.  Subjective Assessment - 01/16/19 1004    Subjective  Pt states she is  feeling better than last visit.  She thinks the dry needling helped a lot.  Is taking Gabapentin and has done the new exercises for a day and it seems helpful.  I can feel the pain in Lt lateral hip and lateral calf this morning but I haven't been up very long.    Pertinent History  injection into Lt trochanteric bursa: 01/02/19    How long can you sit comfortably?  5-10 minutes    How long can you walk comfortably?  antalgic, unsteady    Diagnostic tests  none    Patient Stated Goals  be pain free    Currently in Pain?  Yes    Pain Score  4     Pain Location  Buttocks    Pain Orientation  Left    Pain Descriptors / Indicators  Sharp    Pain Type  Chronic pain    Pain Radiating Towards  Lt lateral leg    Pain Onset  More than a month ago    Aggravating Factors   work tasks as caregiver                       [facility] - 01/16/19 0001      Exercises  Exercises  Lumbar;Knee/Hip      Lumbar Exercises: Aerobic   Nustep  8' level 3   PT present to discuss symptoms     Lumbar Exercises: Sidelying   Clam  15 reps;Left    Hip Abduction  5 reps;Left      Lumbar Exercises: Quadruped   Madcat/Old Horse  5 reps    Madcat/Old Horse Limitations  attempted extension only range but Pt reported increased Lt hip pain    Opposite Arm/Leg Raise  5 reps    Opposite Arm/Leg Raise Limitations  arms only, legs only, Pt reported pain in Lt leg during Lt LE weight bearing    Other Quadruped Lumbar Exercises  quadruped Lt hip extension only x 5 reps      Manual Therapy   Manual Therapy  Soft tissue mobilization    Soft tissue mobilization  glute med, piriformis, Lt, trigger point release and elongation               PT Short Term Goals - 01/14/19 0929      PT SHORT TERM GOAL #1   Title  be independent in initial HEP    Status  Achieved        PT Long Term Goals - 01/07/19 0930      PT LONG TERM GOAL #1   Title  be independent in advanced HEP     Time  8    Period  Weeks    Status  New    Target Date  03/04/19      PT LONG TERM GOAL #2   Title  demonstrate symmetry with ambulation on level surface    Time  8    Period  Weeks    Status  New    Target Date  03/04/19      PT LONG TERM GOAL #3   Title  reduce FOTO to < or = to 25% limitation    Time  8    Period  Weeks    Status  New    Target Date  03/04/19      PT LONG TERM GOAL #4   Title  lift and carry laundry without increased Lt LE pain    Time  8    Period  Weeks    Status  New    Target Date  03/04/19      PT LONG TERM GOAL #5   Title  report a 70% reduction in Lt LE pain with care of child at work    Time  8    Period  Weeks    Status  New    Target Date  03/04/19            Plan - 01/16/19 1048    Clinical Impression Statement  Pt continues to experience ongoing Lt buttock, hip and LE pain which is aggravated with weight bearing through Lt LE during gait and ther ex today.  She reported increased Lt hip pain with efforts to perform extension based exercises.  PT explained that the purpose of extension exercise is to see if symptoms centralize and explained for her to continue to monitor her response to these.  PT found mild trigger points which are much improved since starting PT in distal piriformis and glute med and performed soft tissue mobs.  Pt had a hard time controlling trunk in quadruped with single arm and single leg movements and had increased pain in Lt WB during Rt LE extension.  Pt rated pain after session having reduced to 2/10 so will continue to trial neutral and extension based exercise with careful monitoring of symptoms.    Examination-Activity Limitations  Caring for Others;Carry;Stand;Locomotion Level    Rehab Potential  Excellent    PT Frequency  2x / week    PT Duration  8 weeks    PT Treatment/Interventions  ADLs/Self Care Home Management;Electrical Stimulation;Cryotherapy;Moist Heat;Stair training;Gait training;Therapeutic  activities;Therapeutic exercise;Neuromuscular re-education;Patient/family education;Manual techniques;Passive range of motion;Dry needling;Taping    PT Next Visit Plan  DN as needed, manual, continue to monitor pain/Lt LE symptoms with neutral core and extension based exericses    PT Home Exercise Plan  Access Code: [email or web address]        Patient will benefit from skilled therapeutic intervention in order to improve the following deficits and impairments:  Abnormal gait, Decreased activity tolerance, Decreased endurance, Decreased mobility, Difficulty walking, Increased muscle spasms, Pain  Visit Diagnosis: 1. Other abnormalities of gait and mobility   2. Cramp and spasm   3. Pain in left hip        Problem List Patient Active Problem List   Diagnosis Date Noted  . Greater trochanteric bursitis 11/28/2018  . Atypical squamous cells cannot exclude high grade squamous intraepithelial lesion on cytologic smear of cervix (ASC-H) 09/12/2018  . ASCUS with positive high risk HPV cervical 09/12/2018  . Insomnia 06/17/2018  . 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 Abnormal cervical Papanicolaou smear 04/03/2017  . Atrophic vaginitis 04/03/2017  . Cervical intraepithelial neoplasia grade 1 04/03/2017  . Unspecified dyspareunia (CODE) 04/03/2017  . Genital herpes simplex 04/03/2017  . 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 Menopausal symptom 04/03/2017  . Skin lesion 04/03/2017  . Hyperlipidemia 10/29/2015  . Serum calcium elevated 10/26/2014  . Recurrent UTI 02/05/2014  . Upper airway cough syndrome 10/07/2013  . Uncomplicated asthma 76/19/5093  . Gastro-esophageal reflux disease without esophagitis 10/07/2013  . Loose stools 10/07/2013  . Thumb pain 11/21/2012  . History of depression 11/21/2012  . Medication management 11/21/2012  . Lithium use 09/03/2012  . Rosacea 06/02/2012  . Medication monitoring encounter 06/02/2012  . Depression 06/02/2012   . Perimenopausal 06/02/2012  . Major depressive disorder, single episode 06/02/2012  . Cardiovascular risk factor 08/28/2011  . FH: premature coronary heart disease 08/09/2011  . ESOPHAGEAL REFLUX 08/02/2010  . PLANTAR FASCIITIS, RIGHT 08/02/2010  . OTHER DISEASES OF NASAL CAVITY AND SINUSES 04/20/2010  . HYPERLIPIDEMIA 06/15/2009  . TENDINITIS, RIGHT KNEE 08/11/2008  . Allergic rhinitis 05/14/2007  . Hypothyroidism 03/03/2007  . ECZEMA, HANDS 03/03/2007  . POSTURAL LIGHTHEADEDNESS 03/03/2007  . DEPRESSION 02/27/2007  . Cough variant asthma  vs  Eos bronchitis 02/27/2007    Lisa Lee, PT 01/16/19 10:59 AM   [facility] [address], [place], [place], [address] Phone: [phone]   Fax:  [phone]  Name: Lisa Lee MRN: [id number] Date of Birth: 09-11-1960

## 2019-01-19 ENCOUNTER — Encounter

## 2019-01-21 ENCOUNTER — Ambulatory Visit: Payer: 59

## 2019-01-21 ENCOUNTER — Other Ambulatory Visit: Payer: Self-pay

## 2019-01-21 DIAGNOSIS — M25552 Pain in left hip: Secondary | ICD-10-CM

## 2019-01-21 DIAGNOSIS — R2689 Other abnormalities of gait and mobility: Secondary | ICD-10-CM

## 2019-01-21 DIAGNOSIS — R252 Cramp and spasm: Secondary | ICD-10-CM

## 2019-01-21 NOTE — Therapy (Signed)
[facility] [address] [place], [place], [address] Phone: [phone]   Fax:  [phone]  Physical Therapy Treatment  Patient Details  Name: Lisa Lee MRN: [id number] Date of Birth: 07-20-1961 Referring Provider (PT): Clearance Coots, MD   Encounter Date: 01/21/2019  PT End of Session - 01/21/19 0826    Visit Number  5    Date for PT Re-Evaluation  03/04/19    PT Start Time  0735    PT Stop Time  0832    PT Time Calculation (min)  57 min    Activity Tolerance  Patient tolerated treatment well    Behavior During Therapy  [facility] for tasks assessed/performed       Past Medical History:  Diagnosis Date  . Acid reflux   . Allergic rhinitis   . Asthma   . Depression   . Diarrhea 08/09/2011   Poss ibs  Vs other  Related to stress  consdier other eval if needed. Had colonoscoopy per dr Cristina Gong   . Hemorrhoids   . High cholesterol   . HPV in female 19  . Hypothyroidism   . Medication side effect 11/01/2011   tussionex   distrubed sleep   . Thyroid disease     Past Surgical History:  Procedure Laterality Date  . CERVICAL CONIZATION W/BX N/A 07/21/2015   Procedure: CONIZATION CERVIX WITH BIOPSY;  Surgeon: Ena Dawley, MD;  Location: [facility];  Service: Gynecology;  Laterality: N/A;  . CERVICAL CONIZATION W/BX N/A 09/25/2018   Procedure: CONIZATION CERVIX WITH BIOPSY;  Surgeon: Everitt Amber, MD;  Location: [facility];  Service: Gynecology;  Laterality: N/A;  . CERVICAL CRYOTHERAPY  1984  . COLPOSCOPY  08/26/13  . DILATION AND CURETTAGE, DIAGNOSTIC / THERAPEUTIC  1994   Blighted Ovum  . HYSTEROSCOPY     uterine polypectomy Jan 7th  . HYSTEROSCOPY WITH RESECTOSCOPE  08/04/2009   Removed polyp & IUD  . LYMPH NODE BIOPSY    . TONSILLECTOMY      There were no vitals filed for this visit.  Subjective Assessment - 01/21/19 0737    Subjective  I was doing really well and then I had to  change 3 diapers at work on the floor and it flared up your pain.  I am 75% overall better.  I am not doing the piriformis stretches because they were triggering pain.    Pain Score  0-No pain   up to 5/10 yesterday   Pain Location  Buttocks    Pain Orientation  Left    Pain Descriptors / Indicators  Sharp    Pain Type  Chronic pain    Pain Onset  More than a month ago    Pain Frequency  Intermittent    Aggravating Factors   changing diapers at work, standing too long, descending steps, lying on Lt side    Pain Relieving Factors  ice, supine, Gabapentin, change of position                       [facility] Adult PT Treatment/Exercise - 01/21/19 0001      Lumbar Exercises: Aerobic   Nustep  8' level 3   PT present to discuss symptoms     Lumbar Exercises: Supine   Bridge  20 reps;5 seconds      Lumbar Exercises: Sidelying   Clam  Both;20 reps      Lumbar Exercises: Quadruped   Single Arm  Raise  Left;Right;10 reps    Straight Leg Raise  10 reps      Modalities   Modalities  Moist Heat      Moist Heat Therapy   Number Minutes Moist Heat  10 Minutes    Moist Heat Location  Lumbar Spine      Manual Therapy   Manual Therapy  Soft tissue mobilization;Myofascial release    Manual therapy comments  elongation with trigger point release to bil lumbar paraspinals and Lt gluteals       Trigger Point Dry Needling - 01/21/19 0001    Consent Given?  Yes    Muscles Treated Back/Hip  Gluteus minimus;Gluteus medius;Lumbar multifidi   Lt only gluteals, bil lumbar multifidi   Gluteus Minimus Response  Twitch response elicited;Palpable increased muscle length    Gluteus Medius Response  Twitch response elicited;Palpable increased muscle length    Lumbar multifidi Response  Twitch response elicited;Palpable increased muscle length             PT Short Term Goals - 01/21/19 0743      PT SHORT TERM GOAL #1   Title  be independent in initial HEP    Status  Achieved       PT SHORT TERM GOAL #2   Title  perform sit to stand with Rt=Lt weightbearing    Status  Achieved      PT SHORT TERM GOAL #3   Title  report < or = to 5/10 Lt LE pain with standing and walking    Baseline  5/10    Status  On-going        PT Long Term Goals - 01/07/19 0930      PT LONG TERM GOAL #1   Title  be independent in advanced HEP    Time  8    Period  Weeks    Status  New    Target Date  03/04/19      PT LONG TERM GOAL #2   Title  demonstrate symmetry with ambulation on level surface    Time  8    Period  Weeks    Status  New    Target Date  03/04/19      PT LONG TERM GOAL #3   Title  reduce FOTO to < or = to 25% limitation    Time  8    Period  Weeks    Status  New    Target Date  03/04/19      PT LONG TERM GOAL #4   Title  lift and carry laundry without increased Lt LE pain    Time  8    Period  Weeks    Status  New    Target Date  03/04/19      PT LONG TERM GOAL #5   Title  report a 70% reduction in Lt LE pain with care of child at work    Time  8    Period  Weeks    Status  New    Target Date  03/04/19            Plan - 01/21/19 0756    Clinical Impression Statement  Pt reports 75% overall improvement in Lt gluteal symptoms.  Pt is independent and compliant in her HEP of flexibility and core strength.  Pt performs sit to stand with Rt=Lt weightbearing.  Pt demonstrates difficulty with weightbearing on Lt LE with quadruped exercise.  Pt required verbal and tactile cues  for core activation and level position.  Pt with trigger points and tension in Lt gluteals and lumbar spine and demonstrated improved tissue mobility after dry needling and manual therapy.  Pt will continue to benefit from skilled PT for core and hip strength, flexibility and manual therapy to address Lt gluteal pain.    Rehab Potential  Excellent    PT Frequency  2x / week    PT Duration  8 weeks    PT Treatment/Interventions  ADLs/Self Care Home Management;Electrical  Stimulation;Cryotherapy;Moist Heat;Stair training;Gait training;Therapeutic activities;Therapeutic exercise;Neuromuscular re-education;Patient/family education;Manual techniques;Passive range of motion;Dry needling;Taping    PT Next Visit Plan  assess response to DN, assess Lt LE symptoms and continue with extension based or neutral for now.    PT Home Exercise Plan  Access Code: [email or web address]     Consulted and Agree with Plan of Care  Patient       Patient will benefit from skilled therapeutic intervention in order to improve the following deficits and impairments:  Abnormal gait, Decreased activity tolerance, Decreased endurance, Decreased mobility, Difficulty walking, Increased muscle spasms, Pain  Visit Diagnosis: 1. Pain in left hip   2. Cramp and spasm   3. Other abnormalities of gait and mobility        Problem List Patient Active Problem List   Diagnosis Date Noted  . Greater trochanteric bursitis 11/28/2018  . Atypical squamous cells cannot exclude high grade squamous intraepithelial lesion on cytologic smear of cervix (ASC-H) 09/12/2018  . ASCUS with positive high risk HPV cervical 09/12/2018  . Insomnia 06/17/2018  . 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 Abnormal cervical Papanicolaou smear 04/03/2017  . Atrophic vaginitis 04/03/2017  . Cervical intraepithelial neoplasia grade 1 04/03/2017  . Unspecified dyspareunia (CODE) 04/03/2017  . Genital herpes simplex 04/03/2017  . 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 Menopausal symptom 04/03/2017  . Skin lesion 04/03/2017  . Hyperlipidemia 10/29/2015  . Serum calcium elevated 10/26/2014  . Recurrent UTI 02/05/2014  . Upper airway cough syndrome 10/07/2013  . Uncomplicated asthma 10/07/2013  . Gastro-esophageal reflux disease without esophagitis 10/07/2013  . Loose stools 10/07/2013  . Thumb pain 11/21/2012  . History of depression 11/21/2012  . Medication management 11/21/2012  .  Lithium use 09/03/2012  . Rosacea 06/02/2012  . Medication monitoring encounter 06/02/2012  . Depression 06/02/2012  . Perimenopausal 06/02/2012  . Major depressive disorder, single episode 06/02/2012  . Cardiovascular risk factor 08/28/2011  . FH: premature coronary heart disease 08/09/2011  . ESOPHAGEAL REFLUX 08/02/2010  . PLANTAR FASCIITIS, RIGHT 08/02/2010  . OTHER DISEASES OF NASAL CAVITY AND SINUSES 04/20/2010  . HYPERLIPIDEMIA 06/15/2009  . TENDINITIS, RIGHT KNEE 08/11/2008  . Allergic rhinitis 05/14/2007  . Hypothyroidism 03/03/2007  . ECZEMA, HANDS 03/03/2007  . POSTURAL LIGHTHEADEDNESS 03/03/2007  . DEPRESSION 02/27/2007  . Cough variant asthma  vs  Eos bronchitis 02/27/2007   Lorrene ReidKelly , PT 01/21/19 8:30 AM  [facility] [address], [address] [place], [place], [address] Phone: [phone]   Fax:  [phone]  Name: Lisa Lee MRN: [id number] Date of Birth: 12/02/1960

## 2019-01-23 ENCOUNTER — Other Ambulatory Visit: Payer: Self-pay

## 2019-01-23 ENCOUNTER — Encounter: Payer: 59 | Admitting: Physical Therapy

## 2019-01-23 ENCOUNTER — Encounter: Payer: Self-pay | Admitting: Physical Therapy

## 2019-01-23 ENCOUNTER — Ambulatory Visit: Payer: 59 | Admitting: Physical Therapy

## 2019-01-23 DIAGNOSIS — R2689 Other abnormalities of gait and mobility: Secondary | ICD-10-CM

## 2019-01-23 DIAGNOSIS — M25552 Pain in left hip: Secondary | ICD-10-CM | POA: Diagnosis not present

## 2019-01-23 NOTE — Patient Instructions (Signed)
In standing, bring pelvis back under trunk by releasing buttock gripping and allowing legs to rotate inward from hips into arches of feet.  Soften breast bone and imagine tucking lower ribcage into pelvis to assist abdominal wall support.  Gently draw in lower abdominals and self-check ribcage wiggle side to side to be sure you aren't bracing upper abdominals. Seated on pelvic triangle, think about sitting tall from the triangle upwards.  Can add a gentle slow press of hands into table or thighs to highlight abdominals drawing inwards.  Hold up to 10 sec or to fatigue, repeat up to 10 times.

## 2019-01-23 NOTE — Therapy (Signed)
[facility] [address], [place], [place], [address] Phone: [phone]   Fax:  [phone]  Physical Therapy Treatment  Patient Details  Name: Lisa Lee MRN: [id number] Date of Birth: 1961/03/29 Referring Provider (PT): Clearance Coots, MD   Encounter Date: 01/23/2019  PT End of Session - 01/23/19 1011    Visit Number  6    Date for PT Re-Evaluation  03/04/19    PT Start Time  1003    PT Stop Time  1048    PT Time Calculation (min)  45 min    Activity Tolerance  Patient tolerated treatment well    Behavior During Therapy  [facility] for tasks assessed/performed       Past Medical History:  Diagnosis Date  . Acid reflux   . Allergic rhinitis   . Asthma   . Depression   . Diarrhea 08/09/2011   Poss ibs  Vs other  Related to stress  consdier other eval if needed. Had colonoscoopy per dr Cristina Gong   . Hemorrhoids   . High cholesterol   . 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 Hypothyroidism   . Medication side effect 11/01/2011   tussionex   distrubed sleep   . Thyroid disease     Past Surgical History:  Procedure Laterality Date  . CERVICAL CONIZATION W/BX N/A 07/21/2015   Procedure: CONIZATION CERVIX WITH BIOPSY;  Surgeon: Ena Dawley, MD;  Location: [facility];  Service: Gynecology;  Laterality: N/A;  . CERVICAL CONIZATION W/BX N/A 09/25/2018   Procedure: CONIZATION CERVIX WITH BIOPSY;  Surgeon: Everitt Amber, MD;  Location: [facility];  Service: Gynecology;  Laterality: N/A;  . CERVICAL CRYOTHERAPY  1984  . COLPOSCOPY  08/26/13  . DILATION AND CURETTAGE, DIAGNOSTIC / THERAPEUTIC  1994   Blighted Ovum  . HYSTEROSCOPY     uterine polypectomy Jan 7th  . HYSTEROSCOPY WITH RESECTOSCOPE  08/04/2009   Removed polyp & IUD  . LYMPH NODE BIOPSY    . TONSILLECTOMY      There were no vitals filed for this visit.  Subjective Assessment - 01/23/19 1011    Subjective  I had a little set back yesterday but don't  know what happened.  Pain was sharp by end of day, took ibuprofen and Gabapentin and used ice which helped.  Pain was in left lateral hip, groin and calf.  Felt like nerve pain.    Limitations  Walking;Sitting    How long can you sit comfortably?  5-10 minutes    How long can you walk comfortably?  antalgic, unsteady    Diagnostic tests  none    Patient Stated Goals  be pain free    Currently in Pain?  Yes    Pain Score  1    6/10 yesterday   Pain Location  Hip    Pain Orientation  Left    Pain Descriptors / Indicators  Aching;Sharp    Pain Type  Chronic pain    Pain Radiating Towards  Lt hip, groin, calf (yesterday, not today)    Pain Onset  More than a month ago    Pain Frequency  Intermittent    Aggravating Factors   stand too long, sit on floor, lying on Lt side    Pain Relieving Factors  ibuprofen, Gabapentin, ice                       [facility] Adult PT Treatment/Exercise - 01/23/19 0001  Self-Care   Self-Care  Posture    Posture  center pelvis under trunk by releasing buttock gripping and allowing IR of LEs to put weight through arches of feet, use of TrA  instead of buttocks and hanging on lumbar spine      Neuro Re-ed    Neuro Re-ed Details   much time spent cueing release of buttock gripping in standing, recruitment of TrA in standing and sitting with UE overlay to enhance awareness      Lumbar Exercises: Aerobic   Nustep  9' level 3   PT present to discuss symptoms and progress     Lumbar Exercises: Standing   Other Standing Lumbar Exercises  yellow tband shoulder flexion and extension with VC and TC for TrA and proper standing posture x 15 each      Lumbar Exercises: Seated   Other Seated Lumbar Exercises  feet on black pad, foam roller across lap, in neutral seated posture, VCs to sit tall from pelvis and gently press into foam roller to recruit TrA 10x5 sec holds      Lumbar Exercises: Sidelying   Other Sidelying Lumbar Exercises  clamshells 2x10  bil with VC for TrA             PT Education - 01/23/19 1054    Education Details  see Pt instructions    Person(s) Educated  Patient    Methods  Explanation;Demonstration;Verbal cues;Tactile cues;Handout    Comprehension  Verbalized understanding;Returned demonstration;Tactile cues required;Verbal cues required       PT Short Term Goals - 01/21/19 0743      PT SHORT TERM GOAL #1   Title  be independent in initial HEP    Status  Achieved      PT SHORT TERM GOAL #2   Title  perform sit to stand with Rt=Lt weightbearing    Status  Achieved      PT SHORT TERM GOAL #3   Title  report < or = to 5/10 Lt LE pain with standing and walking    Baseline  5/10    Status  On-going        PT Long Term Goals - 01/07/19 0930      PT LONG TERM GOAL #1   Title  be independent in advanced HEP    Time  8    Period  Weeks    Status  New    Target Date  03/04/19      PT LONG TERM GOAL #2   Title  demonstrate symmetry with ambulation on level surface    Time  8    Period  Weeks    Status  New    Target Date  03/04/19      PT LONG TERM GOAL #3   Title  reduce FOTO to < or = to 25% limitation    Time  8    Period  Weeks    Status  New    Target Date  03/04/19      PT LONG TERM GOAL #4   Title  lift and carry laundry without increased Lt LE pain    Time  8    Period  Weeks    Status  New    Target Date  03/04/19      PT LONG TERM GOAL #5   Title  report a 70% reduction in Lt LE pain with care of child at work    Time  8  Period  Weeks    Status  New    Target Date  03/04/19            Plan - 01/23/19 1055    Clinical Impression Statement  Pt with significant difficulty recruiting TrA in Lee standing and sitting.  Improvements most noted in sitting when cued to sit on pelvic triangle and sit tall from triangle up.  Added foam roller press to enhance awareness of abdominals drawing inward.  Spent much time working on standing posture alignment which wasn't as  successful due to Pt's longstanding pattern of buttock gripping.  Poor awarness at this time of TrA in standing but improving in sitting.  Encouraged Pt to spend time on recruitment and endurance of TrA in multiple postures thorughout day.  Pt will continue to benefit from skilled PT along POC for pain, stabilization and functional strength.    Personal Factors and Comorbidities  Profession    Rehab Potential  Excellent    PT Frequency  2x / week    PT Duration  8 weeks    PT Treatment/Interventions  ADLs/Self Care Home Management;Electrical Stimulation;Cryotherapy;Moist Heat;Stair training;Gait training;Therapeutic activities;Therapeutic exercise;Neuromuscular re-education;Patient/family education;Manual techniques;Passive range of motion;Dry needling;Taping    PT Next Visit Plan  f/u on postural awareness in standing, TrA recruitment in multiple positions with overlay of LE stength as able    PT Home Exercise Plan  Access Code: [email or web address]     Consulted and Agree with Plan of Care  Patient       Patient will benefit from skilled therapeutic intervention in order to improve the following deficits and impairments:     Visit Diagnosis: 1. Pain in left hip   2. Other abnormalities of gait and mobility        Problem List Patient Active Problem List   Diagnosis Date Noted  . Greater trochanteric bursitis 11/28/2018  . Atypical squamous cells cannot exclude high grade squamous intraepithelial lesion on cytologic smear of cervix (ASC-H) 09/12/2018  . ASCUS with positive high risk HPV cervical 09/12/2018  . Insomnia 06/17/2018  . 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 Abnormal cervical Papanicolaou smear 04/03/2017  . Atrophic vaginitis 04/03/2017  . Cervical intraepithelial neoplasia grade 1 04/03/2017  . Unspecified dyspareunia (CODE) 04/03/2017  . Genital herpes simplex 04/03/2017  . 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 Menopausal symptom  04/03/2017  . Skin lesion 04/03/2017  . Hyperlipidemia 10/29/2015  . Serum calcium elevated 10/26/2014  . Recurrent UTI 02/05/2014  . Upper airway cough syndrome 10/07/2013  . Uncomplicated asthma 10/07/2013  . Gastro-esophageal reflux disease without esophagitis 10/07/2013  . Loose stools 10/07/2013  . Thumb pain 11/21/2012  . History of depression 11/21/2012  . Medication management 11/21/2012  . Lithium use 09/03/2012  . Rosacea 06/02/2012  . Medication monitoring encounter 06/02/2012  . Depression 06/02/2012  . Perimenopausal 06/02/2012  . Major depressive disorder, single episode 06/02/2012  . Cardiovascular risk factor 08/28/2011  . FH: premature coronary heart disease 08/09/2011  . ESOPHAGEAL REFLUX 08/02/2010  . PLANTAR FASCIITIS, RIGHT 08/02/2010  . OTHER DISEASES OF NASAL CAVITY AND SINUSES 04/20/2010  . HYPERLIPIDEMIA 06/15/2009  . TENDINITIS, RIGHT KNEE 08/11/2008  . Allergic rhinitis 05/14/2007  . Hypothyroidism 03/03/2007  . ECZEMA, HANDS 03/03/2007  . POSTURAL LIGHTHEADEDNESS 03/03/2007  . DEPRESSION 02/27/2007  . Cough variant asthma  vs  Eos bronchitis 02/27/2007    Johanna Beuhring, PT 01/23/19 11:01 AM   [facility] [address], [address] [place], [place], [address] Phone: [phone]   Fax:  [phone]  Name: Mindi JunkerCathryn D Conchar-Mabe MRN: [id number] Date of Birth: 08/06/1960

## 2019-01-28 ENCOUNTER — Other Ambulatory Visit: Payer: Self-pay

## 2019-01-28 ENCOUNTER — Ambulatory Visit: Payer: 59 | Attending: Family Medicine

## 2019-01-28 DIAGNOSIS — R252 Cramp and spasm: Secondary | ICD-10-CM | POA: Insufficient documentation

## 2019-01-28 DIAGNOSIS — M25552 Pain in left hip: Secondary | ICD-10-CM | POA: Diagnosis present

## 2019-01-28 DIAGNOSIS — R2689 Other abnormalities of gait and mobility: Secondary | ICD-10-CM | POA: Insufficient documentation

## 2019-01-28 NOTE — Therapy (Signed)
[facility] [address], [address] [place], [place], [address] Phone: [phone]   Fax:  [phone]  Physical Therapy Treatment  Patient Details  Name: Mindi JunkerCathryn D Conchar-Mabe MRN: [id number] Date of Birth: 02/07/1961 Referring Provider (PT): Clare GandySchmitz, Jeremy, MD   Encounter Date: 01/28/2019  PT End of Session - 01/28/19 1619    Visit Number  7    Date for PT Re-Evaluation  03/04/19    PT Start Time  1531    PT Stop Time  1615    PT Time Calculation (min)  44 min    Activity Tolerance  Patient tolerated treatment well    Behavior During Therapy  [facility] for tasks assessed/performed       Past Medical History:  Diagnosis Date  . Acid reflux   . Allergic rhinitis   . Asthma   . Depression   . Diarrhea 08/09/2011   Poss ibs  Vs other  Related to stress  consdier other eval if needed. Had colonoscoopy per dr Matthias HughsBuccini   . Hemorrhoids   . High cholesterol   . 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 Hypothyroidism   . Medication side effect 11/01/2011   tussionex   distrubed sleep   . Thyroid disease     Past Surgical History:  Procedure Laterality Date  . CERVICAL CONIZATION W/BX N/A 07/21/2015   Procedure: CONIZATION CERVIX WITH BIOPSY;  Surgeon: Kirkland HunArthur Stringer, MD;  Location: [facility];  Service: Gynecology;  Laterality: N/A;  . CERVICAL CONIZATION W/BX N/A 09/25/2018   Procedure: CONIZATION CERVIX WITH BIOPSY;  Surgeon: Adolphus Birchwoodossi, Emma, MD;  [facility];  Service: Gynecology;  Laterality: N/A;  . CERVICAL CRYOTHERAPY  1984  . COLPOSCOPY  08/26/13  . DILATION AND CURETTAGE, DIAGNOSTIC / THERAPEUTIC  1994   Blighted Ovum  . HYSTEROSCOPY     uterine polypectomy Jan 7th  . HYSTEROSCOPY WITH RESECTOSCOPE  08/04/2009   Removed polyp & IUD  . LYMPH NODE BIOPSY    . TONSILLECTOMY      There were no vitals filed for this visit.  Subjective Assessment - 01/28/19 1532    Subjective  I still have pain and it is mostly towards the  end of the day.  I woke up without pain.  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    Pertinent History  injection into Lt trochanteric bursa: 01/02/19    Currently in Pain?  Yes    Pain Score  0-No pain   up to 6-7/10   Pain Location  Hip    Pain Orientation  Left    Pain Descriptors / Indicators  Aching;Sharp    Pain Type  Chronic pain    Pain Onset  More than a month ago    Pain Frequency  Intermittent    Aggravating Factors   standing too long,later in the day    Pain Relieving Factors  ice, Gabapentin, ibuprofen, stretching                       [facility] Adult PT Treatment/Exercise - 01/28/19 0001      Self-Care   Posture  center pelvis under trunk by releasing buttock gripping and allowing IR of LEs to put weight through arches of feet, use of TrA  instead of buttocks and hanging on lumbar spine    Other Self-Care Comments   improved technique and alignment today  Lumbar Exercises: Stretches   Single Knee to Chest Stretch  Left;Right;3 reps;20 seconds      Lumbar Exercises: Sidelying   Clam  Both;20 reps    Clam Limitations  added yellow theraband      Manual Therapy   Manual Therapy  Soft tissue mobilization;Myofascial release    Manual therapy comments  elongation with trigger point release to bil lumbar paraspinals and Lt gluteals       Trigger Point Dry Needling - 01/28/19 0001    Consent Given?  Yes    Muscles Treated Back/Hip  Gluteus minimus;Gluteus medius   Lt only gluteals   Gluteus Minimus Response  Twitch response elicited;Palpable increased muscle length    Gluteus Medius Response  Twitch response elicited;Palpable increased muscle length             PT Short Term Goals - 01/28/19 1546      PT SHORT TERM GOAL #3   Title  report < or = to 5/10 Lt LE pain with standing and walking    Status  Achieved        PT Long Term Goals - 01/28/19 1546      PT LONG TERM GOAL #1   Title  be independent in  advanced HEP    Time  8    Period  Weeks    Status  On-going      PT LONG TERM GOAL #2   Title  demonstrate symmetry with ambulation on level surface    Time  8    Period  Weeks    Status  On-going      PT LONG TERM GOAL #4   Title  lift and carry laundry without increased Lt LE pain    Time  8    Period  Weeks    Status  On-going      PT LONG TERM GOAL #5   Title  report a 70% reduction in Lt LE pain with care of child at work    Baseline  70% overall.  50% reduction with carrying child    Time  8    Period  Weeks    Status  On-going            Plan - 01/28/19 1615    Clinical Impression Statement  Pt reports 70% overall reduction in Lt LE symptoms since the start of care.  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  Pt reports that she still has pain with lifting and carrying laundry.  Pt with improved standing posture with reduced gluteal activation and improved weightbearing into medial arch with hip IR.  Pt was able to add back in single knee to chest without increased pain. Pt was able to add yellow band with clamshell exercise today.  Pt with trigger points and tension in upper gluteals and had good response to dry needling today and demonstrated improved tissue mobility after manual therapy today.  Pt will continue to benefit from skilled PT for core and hip strength, flexibility and manual.    PT Frequency  2x / week    PT Duration  8 weeks    PT Treatment/Interventions  ADLs/Self Care Home Management;Electrical Stimulation;Cryotherapy;Moist Heat;Stair training;Gait training;Therapeutic activities;Therapeutic exercise;Neuromuscular re-education;Patient/family education;Manual techniques;Passive range of motion;Dry needling;Taping    PT Next Visit Plan  continue to work on postural awareness, TA recruitment with LE motions as able.  Manual and dry needling as needed    PT  Home Exercise Plan  Access Code: [email or web address]     Consulted and Agree  with Plan of Care  Patient       Patient will benefit from skilled therapeutic intervention in order to improve the following deficits and impairments:  Abnormal gait, Decreased activity tolerance, Decreased endurance, Decreased mobility, Difficulty walking, Increased muscle spasms, Pain, Obesity  Visit Diagnosis: 1. Other abnormalities of gait and mobility   2. Pain in left hip   3. Cramp and spasm        Problem List Patient Active Problem List   Diagnosis Date Noted  . Greater trochanteric bursitis 11/28/2018  . Atypical squamous cells cannot exclude high grade squamous intraepithelial lesion on cytologic smear of cervix (ASC-H) 09/12/2018  . ASCUS with positive high risk HPV cervical 09/12/2018  . Insomnia 06/17/2018  . 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 Abnormal cervical Papanicolaou smear 04/03/2017  . Atrophic vaginitis 04/03/2017  . Cervical intraepithelial neoplasia grade 1 04/03/2017  . Unspecified dyspareunia (CODE) 04/03/2017  . Genital herpes simplex 04/03/2017  . 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 Menopausal symptom 04/03/2017  . Skin lesion 04/03/2017  . Hyperlipidemia 10/29/2015  . Serum calcium elevated 10/26/2014  . Recurrent UTI 02/05/2014  . Upper airway cough syndrome 10/07/2013  . Uncomplicated asthma 42/35/3614  . Gastro-esophageal reflux disease without esophagitis 10/07/2013  . Loose stools 10/07/2013  . Thumb pain 11/21/2012  . History of depression 11/21/2012  . Medication management 11/21/2012  . Lithium use 09/03/2012  . Rosacea 06/02/2012  . Medication monitoring encounter 06/02/2012  . Depression 06/02/2012  . Perimenopausal 06/02/2012  . Major depressive disorder, single episode 06/02/2012  . Cardiovascular risk factor 08/28/2011  . FH: premature coronary heart disease 08/09/2011  . ESOPHAGEAL REFLUX 08/02/2010  . PLANTAR FASCIITIS, RIGHT 08/02/2010  . OTHER DISEASES OF NASAL CAVITY AND  SINUSES 04/20/2010  . HYPERLIPIDEMIA 06/15/2009  . TENDINITIS, RIGHT KNEE 08/11/2008  . Allergic rhinitis 05/14/2007  . Hypothyroidism 03/03/2007  . ECZEMA, HANDS 03/03/2007  . POSTURAL LIGHTHEADEDNESS 03/03/2007  . DEPRESSION 02/27/2007  . Cough variant asthma  vs  Eos bronchitis 02/27/2007    Sigurd Sos, PT 01/28/19 4:21 PM  [facility] [address], [place], [place], [address] Phone: [phone]   Fax:  [phone]  Name: OPIE FANTON MRN: [id number] Date of Birth: 1961/07/29

## 2019-02-03 ENCOUNTER — Other Ambulatory Visit: Payer: Self-pay

## 2019-02-03 ENCOUNTER — Ambulatory Visit: Payer: 59 | Admitting: Physical Therapy

## 2019-02-03 ENCOUNTER — Encounter: Payer: Self-pay | Admitting: Physical Therapy

## 2019-02-03 DIAGNOSIS — M25552 Pain in left hip: Secondary | ICD-10-CM

## 2019-02-03 DIAGNOSIS — R2689 Other abnormalities of gait and mobility: Secondary | ICD-10-CM

## 2019-02-03 NOTE — Patient Instructions (Signed)
Access Code: [email or web address]  URL: [email or web address]  Date: 02/03/2019  Prepared by: Venetia Night Beuhring   Exercises  Hooklying Single Knee to Chest - 3 reps - 1 sets - 20 hold - 3x daily - 7x weekly  Seated Hamstring Stretch - 3 reps - 1 sets - 20 hold - 3x daily - 7x weekly  Sit to Stand - 10 reps - 3 sets - 1x daily - 7x weekly  Hip Flexor Stretch on Step - 3 reps - 2 sets - 20 hold - 1x daily - 7x weekly  Clamshell - 10 reps - 2 sets - 2x daily - 7x weekly  Reverse Lunge - 16 reps - 1 sets - 1x daily - 7x weekly  Shoulder extension with resistance - Neutral - 10 reps - 2 sets - 1x daily - 7x weekly  Supine Bridge - 10 reps - 2 sets - 1x daily - 7x weekly

## 2019-02-03 NOTE — Therapy (Signed)
[facility] [address] [place], [place], [address] Phone: [phone]   Fax:  [phone]  Physical Therapy Treatment  Patient Details  Name: Lisa Lee MRN: [id number] Date of Birth: [birth year] Referring Provider (PT): Clearance Coots, MD   Encounter Date: 02/03/2019  PT End of Session - 02/03/19 1036    Visit Number  8    Date for PT Re-Evaluation  03/04/19    PT Start Time  1030    PT Stop Time  1115    PT Time Calculation (min)  45 min    Activity Tolerance  Patient tolerated treatment well    Behavior During Therapy  [facility] for tasks assessed/performed       Past Medical History:  Diagnosis Date  . Acid reflux   . Allergic rhinitis   . Asthma   . Depression   . Diarrhea 08/09/2011   Poss ibs  Vs other  Related to stress  consdier other eval if needed. Had colonoscoopy per dr Cristina Gong   . Hemorrhoids   . High cholesterol   . 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 Hypothyroidism   . Medication side effect 11/01/2011   tussionex   distrubed sleep   . Thyroid disease     Past Surgical History:  Procedure Laterality Date  . CERVICAL CONIZATION W/BX N/A 07/21/2015   Procedure: CONIZATION CERVIX WITH BIOPSY;  Surgeon: Ena Dawley, MD;  Location: [facility];  Service: Gynecology;  Laterality: N/A;  . CERVICAL CONIZATION W/BX N/A 09/25/2018   Procedure: CONIZATION CERVIX WITH BIOPSY;  Surgeon: Everitt Amber, MD;  Location: [facility];  Service: Gynecology;  Laterality: N/A;  . CERVICAL CRYOTHERAPY  1984  . COLPOSCOPY  08/26/13  . DILATION AND CURETTAGE, DIAGNOSTIC / THERAPEUTIC  1994   Blighted Ovum  . HYSTEROSCOPY     uterine polypectomy Jan 7th  . HYSTEROSCOPY WITH RESECTOSCOPE  08/04/2009   Removed polyp & IUD  . LYMPH NODE BIOPSY    . TONSILLECTOMY      There were no vitals filed for this visit.  Subjective Assessment - 02/03/19 1034    Subjective  I'm feeling really good.  I'm on vacation so  that helps.  I had to carry and rock a 14# baby a lot last week and was in agony afterwards but there are other times where I don't have pain.    Pertinent History  injection into Lt trochanteric bursa: 01/02/19    How long can you sit comfortably?  5-10 minutes    How long can you walk comfortably?  antalgic, unsteady    Diagnostic tests  none    Patient Stated Goals  be pain free    Currently in Pain?  Yes    Pain Score  1     Pain Location  Buttocks    Pain Orientation  Left    Pain Descriptors / Indicators  Aching    Pain Type  Chronic pain    Pain Onset  More than a month ago    Pain Frequency  Intermittent    Aggravating Factors   carrying and lifting children/babies at work    Pain Relieving Factors  ice, Gabapentin, meds                       [facility] Adult PT Treatment/Exercise - 02/03/19 0001      Self-Care   Self-Care  Lifting;Other Self-Care Comments    Other Self-Care Comments  getting down to floor  using Lt posterior lunge, keep back neutral vs bend through back, bring baby close to body before standing, using stagger stance to lift child into booster seat, avoid rocking baby by twisting spine but rather using stepping strategies      Exercises   Exercises  Lumbar;Knee/Hip      Lumbar Exercises: Aerobic   UBE (Upper Arm Bike)  L1 x 6' alt each min fwd/bwd standing with PT cueing neutral posture and stable pelvis      Lumbar Exercises: Standing   Shoulder Extension  Strengthening;Both;20 reps;Theraband    Theraband Level (Shoulder Extension)  Level 2 (Red)    Other Standing Lumbar Exercises  pulley shoulder extension hands to hips 20# x 15 reps cueing neutral spine    Other Standing Lumbar Exercises  gait with orange medicine ball varying placement of ball to Lt chest, Rt chest, midline      Lumbar Exercises: Seated   Sit to Stand  10 reps    Sit to Stand Limitations  holding orange medicine ball close to trunk      Knee/Hip Exercises: Standing    Other Standing Knee Exercises  backward lunges x 5 each to build muscle memory for getting down to floor             PT Education - 02/03/19 1107    Education Details  Access Code: [email or web address]    Person(s) Educated  Patient    Methods  Explanation;Demonstration;Verbal cues;Handout    Comprehension  Verbalized understanding;Returned demonstration       PT Short Term Goals - 01/28/19 1546      PT SHORT TERM GOAL #3   Title  report < or = to 5/10 Lt LE pain with standing and walking    Status  Achieved        PT Long Term Goals - 02/03/19 1036      PT LONG TERM GOAL #1   Title  be independent in advanced HEP    Status  On-going      PT LONG TERM GOAL #2   Title  demonstrate symmetry with ambulation on level surface    Status  Achieved      PT LONG TERM GOAL #4   Title  lift and carry laundry without increased Lt LE pain    Baseline  drags basket instead    Status  On-going      PT LONG TERM GOAL #5   Title  report a 70% reduction in Lt LE pain with care of child at work    Baseline  70% overall.  50% reduction with carrying child    Status  On-going            Plan - 02/03/19 1119    Clinical Impression Statement  Pt arrived with 1/10 pain and was able to learn and perform new job related body mechanics strategies today without increase in pain.  Focused on rocking, carrying, lifting and transfer techniques to protect her during work tasks caring for young dependent children.  Updated HEP to reflect aspects of these strategies.  D/c'd lumbar extension exercises as symptoms have centralized and extension is aggravating.  Pt will continue to benefit from self-care strategies, lumbar stabilization and LE functional strength along POC to improve pain and function.    Rehab Potential  Excellent    PT Frequency  2x / week    PT Duration  8 weeks    PT Treatment/Interventions  ADLs/Self  Care Home Management;Electrical Stimulation;Cryotherapy;Moist Heat;Stair  training;Gait training;Therapeutic activities;Therapeutic exercise;Neuromuscular re-education;Patient/family education;Manual techniques;Passive range of motion;Dry needling;Taping    PT Next Visit Plan  f/u on carrying, rocking, lifting strategies, review new HEP from last visit, continue lumbar stabilization in neutral spine with dynamic functional movements    PT Home Exercise Plan  Access Code: [email or web address]     Consulted and Agree with Plan of Care  Patient       Patient will benefit from skilled therapeutic intervention in order to improve the following deficits and impairments:     Visit Diagnosis: 1. Other abnormalities of gait and mobility   2. Pain in left hip        Problem List Patient Active Problem List   Diagnosis Date Noted  . Greater trochanteric bursitis 11/28/2018  . Atypical squamous cells cannot exclude high grade squamous intraepithelial lesion on cytologic smear of cervix (ASC-H) 09/12/2018  . ASCUS with positive high risk HPV cervical 09/12/2018  . Insomnia 06/17/2018  . 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 Abnormal cervical Papanicolaou smear 04/03/2017  . Atrophic vaginitis 04/03/2017  . Cervical intraepithelial neoplasia grade 1 04/03/2017  . Unspecified dyspareunia (CODE) 04/03/2017  . Genital herpes simplex 04/03/2017  . 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 Menopausal symptom 04/03/2017  . Skin lesion 04/03/2017  . Hyperlipidemia 10/29/2015  . Serum calcium elevated 10/26/2014  . Recurrent UTI 02/05/2014  . Upper airway cough syndrome 10/07/2013  . Uncomplicated asthma 10/07/2013  . Gastro-esophageal reflux disease without esophagitis 10/07/2013  . Loose stools 10/07/2013  . Thumb pain 11/21/2012  . History of depression 11/21/2012  . Medication management 11/21/2012  . Lithium use 09/03/2012  . Rosacea 06/02/2012  . Medication monitoring encounter 06/02/2012  . Depression 06/02/2012  . Perimenopausal  06/02/2012  . Major depressive disorder, single episode 06/02/2012  . Cardiovascular risk factor 08/28/2011  . FH: premature coronary heart disease 08/09/2011  . ESOPHAGEAL REFLUX 08/02/2010  . PLANTAR FASCIITIS, RIGHT 08/02/2010  . OTHER DISEASES OF NASAL CAVITY AND SINUSES 04/20/2010  . HYPERLIPIDEMIA 06/15/2009  . TENDINITIS, RIGHT KNEE 08/11/2008  . Allergic rhinitis 05/14/2007  . Hypothyroidism 03/03/2007  . ECZEMA, HANDS 03/03/2007  . POSTURAL LIGHTHEADEDNESS 03/03/2007  . DEPRESSION 02/27/2007  . Cough variant asthma  vs  Eos bronchitis 02/27/2007     , PT 02/03/19 11:24 AM   [facility] [address], [address] [place], [place], [address] Phone: [phone]   Fax:  [phone]  Name: Lisa Lee MRN: [id number] Date of Birth: 01/15/1961

## 2019-02-06 ENCOUNTER — Ambulatory Visit: Payer: 59 | Admitting: Physical Therapy

## 2019-02-08 ENCOUNTER — Other Ambulatory Visit: Payer: Self-pay | Admitting: Cardiology

## 2019-02-08 DIAGNOSIS — E785 Hyperlipidemia, unspecified: Secondary | ICD-10-CM

## 2019-02-10 ENCOUNTER — Ambulatory Visit: Payer: 59

## 2019-02-10 ENCOUNTER — Other Ambulatory Visit: Payer: Self-pay

## 2019-02-10 DIAGNOSIS — R252 Cramp and spasm: Secondary | ICD-10-CM

## 2019-02-10 DIAGNOSIS — R2689 Other abnormalities of gait and mobility: Secondary | ICD-10-CM

## 2019-02-10 DIAGNOSIS — M25552 Pain in left hip: Secondary | ICD-10-CM

## 2019-02-10 NOTE — Therapy (Signed)
[facility] [address], [address] [place], [place], [address] Phone: [phone]   Fax:  [phone]  Physical Therapy Treatment  Patient Details  Name: Lisa Lee MRN: [id number] Date of Birth: 04/18/1961 Referring Provider (PT): Clare GandySchmitz, Jeremy, MD   Encounter Date: 02/10/2019  PT End of Session - 02/10/19 0813    Visit Number  9    Date for PT Re-Evaluation  03/04/19    PT Start Time  0730    PT Stop Time  0812    PT Time Calculation (min)  42 min    Activity Tolerance  Patient tolerated treatment well    Behavior During Therapy  [facility] for tasks assessed/performed       Past Medical History:  Diagnosis Date  . Acid reflux   . Allergic rhinitis   . Asthma   . Depression   . Diarrhea 08/09/2011   Poss ibs  Vs other  Related to stress  consdier other eval if needed. Had colonoscoopy per dr Matthias HughsBuccini   . Hemorrhoids   . High cholesterol   . HPV in female 201984  . Hypothyroidism   . Medication side effect 11/01/2011   tussionex   distrubed sleep   . Thyroid disease     Past Surgical History:  Procedure Laterality Date  . CERVICAL CONIZATION W/BX N/A 07/21/2015   Procedure: CONIZATION CERVIX WITH BIOPSY;  Surgeon: Kirkland HunArthur Stringer, MD;  Location: [facility];  Service: Gynecology;  Laterality: N/A;  . CERVICAL CONIZATION W/BX N/A 09/25/2018   Procedure: CONIZATION CERVIX WITH BIOPSY;  Surgeon: Adolphus Birchwoodossi, Emma, MD;  [facility];  Service: Gynecology;  Laterality: N/A;  . CERVICAL CRYOTHERAPY  1984  . COLPOSCOPY  08/26/13  . DILATION AND CURETTAGE, DIAGNOSTIC / THERAPEUTIC  1994   Blighted Ovum  . HYSTEROSCOPY     uterine polypectomy Jan 7th  . HYSTEROSCOPY WITH RESECTOSCOPE  08/04/2009   Removed polyp & IUD  . LYMPH NODE BIOPSY    . TONSILLECTOMY      There were no vitals filed for this visit.  Subjective Assessment - 02/10/19 0732    Subjective  I had a long stretch where I haven't had much  pain.  I had a flare-up over the weekend due to a long car ride.    Currently in Pain?  Yes    Pain Score  3     Pain Location  Buttocks    Pain Orientation  Left    Pain Descriptors / Indicators  Aching    Pain Type  Chronic pain    Pain Radiating Towards  Lt hip/groin, calf    Pain Onset  More than a month ago    Pain Frequency  Intermittent    Aggravating Factors   long car ride, lifting/carrying babies at work    Pain Relieving Factors  ice, Gabapentin, medication                       [facility] Adult PT Treatment/Exercise - 02/10/19 0001      Self-Care   Other Self-Care Comments   getting down to floor  using Lt posterior lunge, keep back neutral vs bend through back, bring baby close to body before standing, using stagger stance to lift child into booster seat, avoid rocking baby by twisting spine but rather using stepping strategies      Lumbar Exercises: Stretches   Active Hamstring Stretch  Left;Right;20 seconds;3 reps  Lumbar Exercises: Aerobic   UBE (Upper Arm Bike)  --    Nustep  NuStep: Level 3 x 8 minutes       Lumbar Exercises: Standing   Other Standing Lumbar Exercises  pulley shoulder extension hands to hips 20# 2x15  reps cueing neutral spine      Lumbar Exercises: Seated   Sit to Stand  10 reps    Sit to Stand Limitations  holding orange medicine ball close to trunk: 10 on each side      Knee/Hip Exercises: Standing   Other Standing Knee Exercises  backward lunges x 10 each to build muscle memory for getting down to floor      Manual Therapy   Manual Therapy  Soft tissue mobilization;Myofascial release    Manual therapy comments  Addaday: Lt gluteals for tissue mobilization               PT Short Term Goals - 01/28/19 1546      PT SHORT TERM GOAL #3   Title  report < or = to 5/10 Lt LE pain with standing and walking    Status  Achieved        PT Long Term Goals - 02/10/19 0744      PT LONG TERM GOAL #1   Title  be  independent in advanced HEP    Time  8    Period  Weeks    Status  On-going      PT LONG TERM GOAL #2   Title  demonstrate symmetry with ambulation on level surface    Status  Achieved      PT LONG TERM GOAL #4   Title  lift and carry laundry without increased Lt LE pain    Baseline  drags basket instead    Time  8    Period  Weeks    Status  On-going      PT LONG TERM GOAL #5   Title  report a 70% reduction in Lt LE pain with care of child at work    Baseline  80% overall, 50% reduction with carrying child    Time  8    Period  Weeks    Status  On-going            Plan - 02/10/19 0750    Clinical Impression Statement  Pt had a stretch of time last week when she wasn't having pain.  Pain in the Lt LE increased after a long car ride and hiking.  Pt reports 80% overall reduction in Lt LE pain since the start of care.  Session today focused on functional strength with emphasis on neutral posture.  Pt demonstrated trunk and hip instability and fatigue with reverse lunge today.  Pt will continue to benefit from skilled PT to address functional strength deficits required for work tasks.    PT Frequency  2x / week    PT Duration  8 weeks    PT Treatment/Interventions  ADLs/Self Care Home Management;Electrical Stimulation;Cryotherapy;Moist Heat;Stair training;Gait training;Therapeutic activities;Therapeutic exercise;Neuromuscular re-education;Patient/family education;Manual techniques;Passive range of motion;Dry needling;Taping    PT Next Visit Plan  lumbar stabilization in neutral spine, carrying and lifting strategies    PT Home Exercise Plan  Access Code: [email or web address]     Consulted and Agree with Plan of Care  Patient       Patient will benefit from skilled therapeutic intervention in order to improve the following deficits and impairments:  Abnormal gait, Decreased activity tolerance,  Decreased endurance, Decreased mobility, Difficulty walking, Increased muscle spasms, Pain,  Obesity  Visit Diagnosis: 1. Other abnormalities of gait and mobility   2. Pain in left hip   3. Cramp and spasm        Problem List Patient Active Problem List   Diagnosis Date Noted  . Greater trochanteric bursitis 11/28/2018  . Atypical squamous cells cannot exclude high grade squamous intraepithelial lesion on cytologic smear of cervix (ASC-H) 09/12/2018  . ASCUS with positive high risk HPV cervical 09/12/2018  . Insomnia 06/17/2018  . 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 Abnormal cervical Papanicolaou smear 04/03/2017  . Atrophic vaginitis 04/03/2017  . Cervical intraepithelial neoplasia grade 1 04/03/2017  . Unspecified dyspareunia (CODE) 04/03/2017  . Genital herpes simplex 04/03/2017  . 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 Menopausal symptom 04/03/2017  . Skin lesion 04/03/2017  . Hyperlipidemia 10/29/2015  . Serum calcium elevated 10/26/2014  . Recurrent UTI 02/05/2014  . Upper airway cough syndrome 10/07/2013  . Uncomplicated asthma 15/40/0867  . Gastro-esophageal reflux disease without esophagitis 10/07/2013  . Loose stools 10/07/2013  . Thumb pain 11/21/2012  . History of depression 11/21/2012  . Medication management 11/21/2012  . Lithium use 09/03/2012  . Rosacea 06/02/2012  . Medication monitoring encounter 06/02/2012  . Depression 06/02/2012  . Perimenopausal 06/02/2012  . Major depressive disorder, single episode 06/02/2012  . Cardiovascular risk factor 08/28/2011  . FH: premature coronary heart disease 08/09/2011  . ESOPHAGEAL REFLUX 08/02/2010  . PLANTAR FASCIITIS, RIGHT 08/02/2010  . OTHER DISEASES OF NASAL CAVITY AND SINUSES 04/20/2010  . HYPERLIPIDEMIA 06/15/2009  . TENDINITIS, RIGHT KNEE 08/11/2008  . Allergic rhinitis 05/14/2007  . Hypothyroidism 03/03/2007  . ECZEMA, HANDS 03/03/2007  . POSTURAL LIGHTHEADEDNESS 03/03/2007  . DEPRESSION 02/27/2007  . Cough variant asthma  vs  Eos bronchitis  02/27/2007     Sigurd Sos, PT 02/10/19 8:18 AM  [facility] [address] [place], [place], [address] Phone: [phone]   Fax:  [phone]  Name: Lisa Lee MRN: [id number] Date of Birth: 23-Dec-1960

## 2019-02-11 ENCOUNTER — Other Ambulatory Visit: Payer: Self-pay

## 2019-02-11 ENCOUNTER — Ambulatory Visit: Payer: 59

## 2019-02-11 DIAGNOSIS — R2689 Other abnormalities of gait and mobility: Secondary | ICD-10-CM | POA: Diagnosis not present

## 2019-02-11 DIAGNOSIS — M25552 Pain in left hip: Secondary | ICD-10-CM

## 2019-02-11 DIAGNOSIS — R252 Cramp and spasm: Secondary | ICD-10-CM

## 2019-02-11 NOTE — Therapy (Signed)
[facility] [address], [address] [place], [place], [address] Phone: [phone]   Fax:  [phone]  Physical Therapy Treatment  Patient Details  Name: Lisa JunkerCathryn D Conchar-Mabe MRN: [id number] Date of Birth: 07/31/1960 Referring Provider (PT): Clare GandySchmitz, Jeremy, MD   Encounter Date: 02/11/2019  PT End of Session - 02/11/19 1504    Visit Number  10    Date for PT Re-Evaluation  03/04/19    PT Start Time  1425    PT Stop Time  1508    PT Time Calculation (min)  43 min    Activity Tolerance  Patient tolerated treatment well    Behavior During Therapy  [facility] for tasks assessed/performed       Past Medical History:  Diagnosis Date  . Acid reflux   . Allergic rhinitis   . Asthma   . Depression   . Diarrhea 08/09/2011   Poss ibs  Vs other  Related to stress  consdier other eval if needed. Had colonoscoopy per dr Matthias HughsBuccini   . Hemorrhoids   . High cholesterol   . HPV in female 11984  . Hypothyroidism   . Medication side effect 11/01/2011   tussionex   distrubed sleep   . Thyroid disease     Past Surgical History:  Procedure Laterality Date  . CERVICAL CONIZATION W/BX N/A 07/21/2015   Procedure: CONIZATION CERVIX WITH BIOPSY;  Surgeon: Kirkland HunArthur Stringer, MD;  Location: [facility];  Service: Gynecology;  Laterality: N/A;  . CERVICAL CONIZATION W/BX N/A 09/25/2018   Procedure: CONIZATION CERVIX WITH BIOPSY;  Surgeon: Adolphus Birchwoodossi, Emma, MD;  [facility];  Service: Gynecology;  Laterality: N/A;  . CERVICAL CRYOTHERAPY  1984  . COLPOSCOPY  08/26/13  . DILATION AND CURETTAGE, DIAGNOSTIC / THERAPEUTIC  1994   Blighted Ovum  . HYSTEROSCOPY     uterine polypectomy Jan 7th  . HYSTEROSCOPY WITH RESECTOSCOPE  08/04/2009   Removed polyp & IUD  . LYMPH NODE BIOPSY    . TONSILLECTOMY      There were no vitals filed for this visit.  Subjective Assessment - 02/11/19 1425    Subjective  I didn't do as many exercises last night  because I was here this morning.    Pertinent History  injection into Lt trochanteric bursa: 01/02/19    Currently in Pain?  No/denies                       [facility]/Exercise - 02/11/19 0001      Lumbar Exercises: Stretches   Active Hamstring Stretch  Left;Right;20 seconds;3 reps    Figure 4 Stretch  2 reps;Seated;20 seconds      Lumbar Exercises: Aerobic   UBE (Upper Arm Bike)  L1 x 6' alt each min fwd/bwd standing with PT cueing neutral posture and stable pelvis    Nustep  NuStep: Level 3 x 8 minutes       Lumbar Exercises: Standing   Other Standing Lumbar Exercises  pulley shoulder extension hands to hips 20# 2x15  reps cueing neutral spine    Other Standing Lumbar Exercises  Pallof press 2x10, holding red band in front (anchored on the side) with reverse lunge x 10 each      Lumbar Exercises: Seated   Sit to Stand  10 reps    Sit to Stand Limitations  holding orange medicine ball close to trunk: 10 on each side      Lumbar Exercises:  Supine   Ab Set  20 reps   with ball rolls     Knee/Hip Exercises: Standing   Other Standing Knee Exercises  backward lunges x 10 each to build muscle memory for getting down to floor      Knee/Hip Exercises: Supine   Bridges  Strengthening;Both;2 sets;10 reps    Bridges Limitations  green ball under legs               PT Short Term Goals - 01/28/19 1546      PT SHORT TERM GOAL #3   Title  report < or = to 5/10 Lt LE pain with standing and walking    Status  Achieved        PT Long Term Goals - 02/10/19 0744      PT LONG TERM GOAL #1   Title  be independent in advanced HEP    Time  8    Period  Weeks    Status  On-going      PT LONG TERM GOAL #2   Title  demonstrate symmetry with ambulation on level surface    Status  Achieved      PT LONG TERM GOAL #4   Title  lift and carry laundry without increased Lt LE pain    Baseline  drags basket instead    Time  8    Period  Weeks    Status   On-going      PT LONG TERM GOAL #5   Title  report a 70% reduction in Lt LE pain with care of child at work    Baseline  80% overall, 50% reduction with carrying child    Time  8    Period  Weeks    Status  On-going            Plan - 02/11/19 1434    Clinical Impression Statement  Pt denies any pain after work today.  Pt has not been as consistent with exercises due to work schedule.  Session focused on functional strength with emphasis on neutral core.  Pt required tactile cues for neutral posture, and was able to make corrections without cueing.  Pt will continue to benefit from skilled PT for hip and core strength with focus on neutral core.    PT Frequency  2x / week    PT Duration  8 weeks    PT Treatment/Interventions  ADLs/Self Care Home Management;Electrical Stimulation;Cryotherapy;Moist Heat;Stair training;Gait training;Therapeutic activities;Therapeutic exercise;Neuromuscular re-education;Patient/family education;Manual techniques;Passive range of motion;Dry needling;Taping    PT Next Visit Plan  lumbar stabilization in neutral spine, carrying and lifting strategies    PT Home Exercise Plan  Access Code: [email or web address]     Consulted and Agree with Plan of Care  Patient       Patient will benefit from skilled therapeutic intervention in order to improve the following deficits and impairments:  Abnormal gait, Decreased activity tolerance, Decreased endurance, Decreased mobility, Difficulty walking, Increased muscle spasms, Pain, Obesity  Visit Diagnosis: 1. Other abnormalities of gait and mobility   2. Pain in left hip   3. Cramp and spasm        Problem List Patient Active Problem List   Diagnosis Date Noted  . Greater trochanteric bursitis 11/28/2018  . Atypical squamous cells cannot exclude high grade squamous intraepithelial lesion on cytologic smear of cervix (ASC-H) 09/12/2018  . ASCUS with positive high risk HPV cervical 09/12/2018  . Insomnia 06/17/2018  .  Depressed bipolar I  disorder in partial remission ([place]) 06/17/2018  . Abnormal cervical Papanicolaou smear 04/03/2017  . Atrophic vaginitis 04/03/2017  . Cervical intraepithelial neoplasia grade 1 04/03/2017  . Unspecified dyspareunia (CODE) 04/03/2017  . Genital herpes simplex 04/03/2017  . 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 Menopausal symptom 04/03/2017  . Skin lesion 04/03/2017  . Hyperlipidemia 10/29/2015  . Serum calcium elevated 10/26/2014  . Recurrent UTI 02/05/2014  . Upper airway cough syndrome 10/07/2013  . Uncomplicated asthma 07/62/2633  . Gastro-esophageal reflux disease without esophagitis 10/07/2013  . Loose stools 10/07/2013  . Thumb pain 11/21/2012  . History of depression 11/21/2012  . Medication management 11/21/2012  . Lithium use 09/03/2012  . Rosacea 06/02/2012  . Medication monitoring encounter 06/02/2012  . Depression 06/02/2012  . Perimenopausal 06/02/2012  . Major depressive disorder, single episode 06/02/2012  . Cardiovascular risk factor 08/28/2011  . FH: premature coronary heart disease 08/09/2011  . ESOPHAGEAL REFLUX 08/02/2010  . PLANTAR FASCIITIS, RIGHT 08/02/2010  . OTHER DISEASES OF NASAL CAVITY AND SINUSES 04/20/2010  . HYPERLIPIDEMIA 06/15/2009  . TENDINITIS, RIGHT KNEE 08/11/2008  . Allergic rhinitis 05/14/2007  . Hypothyroidism 03/03/2007  . ECZEMA, HANDS 03/03/2007  . POSTURAL LIGHTHEADEDNESS 03/03/2007  . DEPRESSION 02/27/2007  . Cough variant asthma  vs  Eos bronchitis 02/27/2007    Sigurd Sos, PT 02/11/19 3:08 PM  [facility] [address], [place], [place], [address] Phone: [phone]   Fax:  [phone]  Name: RHEANNON CERNEY MRN: [id number] Date of Birth: Jun 22, 1961

## 2019-02-12 NOTE — Progress Notes (Signed)
Chief Complaint  Patient presents with  . Annual Exam    HPI: Patient  Lisa Lee  58 y.o. 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 Swims   Psych  utd every 4 mos      Health Maintenance  Topic Date Due  . MAMMOGRAM  05/30/2018  . INFLUENZA VACCINE  02/28/2019  . TETANUS/TDAP  02/21/2021  . PAP SMEAR-Modifier  07/25/2021  . COLONOSCOPY  07/05/2023  . Hepatitis C Screening  Completed  . 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 GI /GU: No adominal pain, vomiting, change in bowel habits. No blood in the stool. No significant GU symptoms. SKIN/HEME: ,no acute skin rashes suspicious lesions or bleeding. No lymphadenopathy, nodules, masses.  NEURO/ PSYCH:  No neurologic signs such as weakness numbness. No depression anxiety. IMM/ Allergy: No unusual infections.  Allergy .   REST of 12 system review negative except as per HPI   Past Medical History:  Diagnosis Date  . Acid reflux   . Allergic rhinitis   . Asthma   . Depression   . Diarrhea 08/09/2011   Poss ibs  Vs other  Related to stress  consdier other eval if needed. Had colonoscoopy per dr Cristina Gong   . Hemorrhoids   . High cholesterol   . 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 Hypothyroidism   . Medication side effect 11/01/2011   tussionex   distrubed sleep   . Thyroid disease     Past Surgical History:  Procedure Laterality Date  .  CERVICAL CONIZATION W/BX N/A 07/21/2015   Procedure: CONIZATION CERVIX WITH BIOPSY;  Surgeon: Ena Dawley, MD;  Location: [facility];  Service: Gynecology;  Laterality: N/A;  . CERVICAL CONIZATION W/BX N/A 09/25/2018   Procedure: CONIZATION CERVIX WITH BIOPSY;  Surgeon: Everitt Amber, MD;  Location: [facility];  Service: Gynecology;  Laterality: N/A;  . CERVICAL CRYOTHERAPY  1984  . COLPOSCOPY  08/26/13  . DILATION AND CURETTAGE, DIAGNOSTIC / THERAPEUTIC  1994   Blighted Ovum  . HYSTEROSCOPY     uterine polypectomy Jan 7th  . HYSTEROSCOPY WITH RESECTOSCOPE  08/04/2009   Removed polyp & IUD  . LYMPH NODE BIOPSY    . TONSILLECTOMY      Family History  Problem Relation Age of Onset  . Heart disease Sister 5       cabg stent  . Arthritis Mother   . Heart disease Mother   . Heart disease Father   . Colon cancer Father   . Parkinson's disease Father   . Pulmonary embolism Daughter   . Heart disease Sister   . Thyroid disease Sister   . Heart disease Brother   . Thyroid disease Daughter   . Rectal cancer Neg Hx     Social History  Socioeconomic History  . Marital status: Married    Spouse name: Not on file  . Number of children: Not on file  . Years of education: Not on file  . Highest education level: Not on file  Occupational History  . Not on file  Social Needs  . Financial resource strain: Not on file  . Food insecurity    Worry: Not on file    Inability: Not on file  . Transportation needs    Medical: Not on file    Non-medical: Not on file  Tobacco Use  . Smoking status: Never Smoker  . Smokeless tobacco: Never Used  Substance and Sexual Activity  . Alcohol use: Yes    Alcohol/week: 0.0 standard drinks    Comment: rare  . Drug use: No  . Sexual activity: Yes    Birth control/protection: None  Lifestyle  . Physical activity    Days per week: Not on file    Minutes per session: Not on file  . Stress: Not on file  Relationships  . Social  Herbalist on phone: Not on file    Gets together: Not on file    Attends religious service: Not on file    Active member of club or organization: Not on file    Attends meetings of clubs or organizations: Not on file    Relationship status: Not on file  Other Topics Concern  . Not on file  Social History Narrative    [employer]. 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 albuterol (PROAIR HFA) 108 (90 Base) MCG/ACT inhaler Inhale 2 puffs into the lungs every 6 (six) hours as needed for wheezing. 3 Inhaler 0  . aspirin EC 81 MG tablet Take 1 tablet (81 mg total) by mouth daily.    Marland Kitchen atorvastatin (LIPITOR) 10 MG tablet TAKE 1 TABLET DAILY 90 tablet 3  . Cholecalciferol (VITAMIN D) 2000 units tablet Take 2,000 Units by mouth daily.    Marland Kitchen dextromethorphan-guaiFENesin (MUCINEX DM) 30-600 MG 12hr tablet Take 1 tablet by mouth 2 (two) times daily as needed for cough.    . doxylamine, Sleep, (UNISOM) 25 MG tablet Take 25 mg by mouth at bedtime as needed.    Marland Kitchen escitalopram (LEXAPRO) 20 MG tablet Take 1 tablet (20 mg total) by mouth daily. 90 tablet 1  . famotidine (PEPCID) 20 MG tablet TAKE 1 TABLET AT BEDTIME 90 tablet 4  . fenofibrate 160 MG tablet TAKE 1 TABLET BY MOUTH EVERY DAY 90 tablet 0  . gabapentin (NEURONTIN) 100 MG capsule Take 1 capsule (100 mg total) by mouth 3 (three) times daily. 90 capsule 3  . levothyroxine (SYNTHROID) 75 MCG tablet TAKE 1 TABLET BY MOUTH EVERY DAY 90 tablet 3  . lithium carbonate (LITHOBID) 300 MG CR tablet Take 1 tablet (300 mg total) by mouth 2 (two) times daily. 180 tablet 1  . loperamide (IMODIUM A-D) 2 MG tablet Take 1 mg by mouth daily.     . modafinil (PROVIGIL) 200 MG tablet Take 0.5 tablets (100 mg total) by  mouth daily. 30 tablet 5  . Multiple Vitamins-Minerals (MULTIVITAMIN WITH MINERALS) tablet Take 1 tablet  by mouth daily.    . pantoprazole (PROTONIX) 40 MG tablet TAKE 1 TABLET DAILY 90 tablet 4  . valACYclovir (VALTREX) 500 MG tablet valacyclovir 500 mg tablet  For outbreak: Take one tablet by oral route twice a day for 3 days. For suppression: Take one tablet by oral route daily.    . Wheat Dextrin (BENEFIBER) POWD Take 2 heaping teaspoons by mouth daily    . YUVAFEM 10 MCG TABS vaginal tablet Place 1 tablet vaginally 2 (two) times a week.     Marland Kitchen ibuprofen (ADVIL,MOTRIN) 800 MG tablet Take 1 tablet (800 mg total) by mouth every 8 (eight) hours as needed for moderate pain. For AFTER surgery 30 tablet 1  . Ibuprofen-Famotidine 800-26.6 MG TABS Take 1 tablet by mouth 3 (three) times daily. 90 tablet 3   No facility-administered medications prior to visit.      EXAM:  BP (!) 110/58   Pulse 66   Temp 99.7 F (37.6 C) (Oral)   Ht 5' 1"  (1.549 m)   Wt 136 lb (61.7 kg)   LMP 12/09/2013   SpO2 97%   BMI 25.70 kg/m   Body mass index is 25.7 kg/m. Wt Readings from Last 3 Encounters:  02/13/19 136 lb (61.7 kg)  11/28/18 134 lb (60.8 kg)  11/12/18 136 lb (61.7 kg)    Physical Exam: Vital signs reviewed NTI:RWER is a well-developed well-nourished alert cooperative    who appearsr stated age in no acute distress.  HEENT: normocephalic atraumatic , Eyes: PERRL EOM's full, conjunctiva clear, Nares: paten,t no deformity discharge or tenderness., Ears: no deformity EAC's clear TMs with normal landmarks. Mouth: clear OP, no lesions, edema.  Moist mucous membranes. Dentition in adequate repair. NECK: supple without masses, thyromegaly or bruits. CHEST/PULM:  Clear to auscultation and percussion breath sounds equal no wheeze , rales or rhonchi. No chest wall deformities or tenderness. Breast: normal by inspection . No dimpling, discharge, masses, tenderness or discharge . CV: PMI is  nondisplaced, S1 S2 no gallops, murmurs, rubs. Peripheral pulses are full without delay.No JVD .  ABDOMEN: Bowel sounds normal nontender  No guard or rebound, no hepato splenomegal no CVA tenderness.  No hernia. Extremtities:  No clubbing cyanosis or edema, no acute joint swelling or redness no focal atrophy NEURO:  Oriented x3, cranial nerves 3-12 appear to be intact, no obvious focal weakness,gait within normal limits no abnormal reflexes or asymmetrical SKIN: No acute rashes normal turgor, color, no bruising or petechiae. PSYCH: Oriented, good eye contact, no obvious depression anxiety, cognition and judgment appear normal. LN: no cervical axillary inguinal adenopathy  Lab Results  Component Value Date   WBC 8.2 08/01/2018   HGB 13.5 08/01/2018   HCT 40.0 08/01/2018   PLT 333.0 08/01/2018   GLUCOSE 132 (H) 08/01/2018   CHOL 176 08/01/2018   TRIG 135.0 08/01/2018   HDL 56.40 08/01/2018   LDLDIRECT 101.0 10/30/2016   LDLCALC 93 08/01/2018   ALT 31 08/01/2018   AST 25 08/01/2018   NA 140 08/01/2018   K 4.7 08/01/2018   CL 107 08/01/2018   CREATININE 1.00 08/01/2018   BUN 26 (H) 08/01/2018   CO2 25 08/01/2018   TSH 1.45 08/01/2018   HGBA1C 5.4 02/13/2019    BP Readings from Last 3 Encounters:  02/13/19 (!) 110/58  01/02/19 124/70  11/28/18 132/72    Lab plan  reviewed with patient   ASSESSMENT AND PLAN:  Discussed the following assessment and plan:    ICD-10-CM  1. Visit for preventive health examination  Z00.00   2. Medication management  Z79.899   3. Elevated blood sugar  R73.9 POCT glycosylated hemoglobin (Hb A1C)   non fasting    and nl a1c    Continue lifestyle intervention healthy eating and exercise .  cpx in a year but  If needds lab tets per med monitoring etc then contact us.   She will see  Dr Meda Coffee I in follow up also.  Since no sx of post prandial sx  Ok to not proceed with the GTT at this time  Patient Care Team: Keyari Kleeman, Standley Brooking, MD as PCP - General  Ralene Bathe, MD (Ophthalmology) Ena Dawley, MD (Obstetrics and Gynecology) Ronald Lobo, MD as Consulting Physician (Gastroenterology) Tanda Rockers, MD as Consulting Physician (Pulmonary Disease) Aldean Baker, MD as Consulting Physician (Cardiology) Patient Instructions    Continue your exercises   At this time  If feeling ok no need to get the glucose tolerance testing  If sugars are ok .  Lab Results  Component Value Date   WBC 8.2 08/01/2018   HGB 13.5 08/01/2018   HCT 40.0 08/01/2018   PLT 333.0 08/01/2018   GLUCOSE 132 (H) 08/01/2018   CHOL 176 08/01/2018   TRIG 135.0 08/01/2018   HDL 56.40 08/01/2018   LDLDIRECT 101.0 10/30/2016   LDLCALC 93 08/01/2018   ALT 31 08/01/2018   AST 25 08/01/2018   NA 140 08/01/2018   K 4.7 08/01/2018   CL 107 08/01/2018   CREATININE 1.00 08/01/2018   BUN 26 (H) 08/01/2018   CO2 25 08/01/2018   TSH 1.45 08/01/2018   HGBA1C 5.5 08/01/2018        Preventive Care 68-6 Years Old, Female Preventive care refers to visits with your health care provider and lifestyle choices that can promote health and wellness. This includes:  A yearly physical exam. This may also be called an annual well check.  Regular dental visits and eye exams.  Immunizations.  Screening for certain conditions.  Healthy lifestyle choices, such as eating a healthy diet, getting regular exercise, not using drugs or products that contain nicotine and tobacco, and limiting alcohol use. What can I expect for my preventive care visit? Physical exam Your health care provider will check your:  Height and weight. This may be used to calculate body mass index (BMI), which tells if you are at a healthy weight.  Heart rate and blood pressure.  Skin for abnormal spots. Counseling Your health care provider may ask you questions about your:  Alcohol, tobacco, and drug use.  Emotional well-being.  Home and relationship  well-being.  Sexual activity.  Eating habits.  Work and work Statistician.  Method of birth control.  Menstrual cycle.  Pregnancy history. What immunizations do I need?  Influenza (flu) vaccine  This is recommended every year. Tetanus, diphtheria, and pertussis (Tdap) vaccine  You may need a Td booster every 10 years. Varicella (chickenpox) vaccine  You may need this if you have not been vaccinated. Zoster (shingles) vaccine  You may need this after age 28. Measles, mumps, and rubella (MMR) vaccine  You may need at least one dose of MMR if you were born in 1957 or later. You may also need a second dose. Pneumococcal conjugate (PCV13) vaccine  You may need this if you have certain conditions and were not previously vaccinated. Pneumococcal polysaccharide (PPSV23) vaccine  You may need one or two doses if you smoke cigarettes or if  you have certain conditions. Meningococcal conjugate (MenACWY) vaccine  You may need this if you have certain conditions. Hepatitis A vaccine  You may need this if you have certain conditions or if you travel or work in places where you may be exposed to hepatitis A. Hepatitis B vaccine  You may need this if you have certain conditions or if you travel or work in places where you may be exposed to hepatitis B. Haemophilus influenzae type b (Hib) vaccine  You may need this if you have certain conditions. Human papillomavirus (HPV) vaccine  If recommended by your health care provider, you may need three doses over 6 months. You may receive vaccines as individual doses or as more than one vaccine together in one shot (combination vaccines). Talk with your health care provider about the risks and benefits of combination vaccines. What tests do I need? Blood tests  Lipid and cholesterol levels. These may be checked every 5 years, or more frequently if you are over 44 years old.  Hepatitis C test.  Hepatitis B test. Screening  Lung  cancer screening. You may have this screening every year starting at age 50 if you have a 30-pack-year history of smoking and currently smoke or have quit within the past 15 years.  Colorectal cancer screening. All adults should have this screening starting at age 87 and continuing until age 69. Your health care provider may recommend screening at age 34 if you are at increased risk. You will have tests every 1-10 years, depending on your results and the type of screening test.  Diabetes screening. This is done by checking your blood sugar (glucose) after you have not eaten for a while (fasting). You may have this done every 1-3 years.  Mammogram. This may be done every 1-2 years. Talk with your health care provider about when you should start having regular mammograms. This may depend on whether you have a family history of breast cancer.  BRCA-related cancer screening. This may be done if you have a family history of breast, ovarian, tubal, or peritoneal cancers.  Pelvic exam and Pap test. This may be done every 3 years starting at age 1. Starting at age 84, this may be done every 5 years if you have a Pap test in combination with an HPV test. Other tests  Sexually transmitted disease (STD) testing.  Bone density scan. This is done to screen for osteoporosis. You may have this scan if you are at high risk for osteoporosis. Follow these instructions at home: Eating and drinking  Eat a diet that includes fresh fruits and vegetables, whole grains, lean protein, and low-fat dairy.  Take vitamin and mineral supplements as recommended by your health care provider.  Do not drink alcohol if: ? Your health care provider tells you not to drink. ? You are pregnant, may be pregnant, or are planning to become pregnant.  If you drink alcohol: ? Limit how much you have to 0-1 drink a day. ? Be aware of how much alcohol is in your drink. In the [place] one drink equals one 12 oz bottle of beer (355  mL), one 5 oz glass of wine (148 mL), or one 1 oz glass of hard liquor (44 mL). Lifestyle  Take daily care of your teeth and gums.  Stay active. Exercise for at least 30 minutes on 5 or more days each week.  Do not use any products that contain nicotine or tobacco, such as cigarettes, e-cigarettes, and chewing tobacco. If  you need help quitting, ask your health care provider.  If you are sexually active, practice safe sex. Use a condom or other form of birth control (contraception) in order to prevent pregnancy and STIs (sexually transmitted infections).  If told by your health care provider, take low-dose aspirin daily starting at age 65. What's next?  Visit your health care provider once a year for a well check visit.  Ask your health care provider how often you should have your eyes and teeth checked.  Stay up to date on all vaccines. This information is not intended to replace advice given to you by your health care provider. Make sure you discuss any questions you have with your health care provider. Document Released: 08/12/2015 Document Revised: 03/27/2018 Document Reviewed: 03/27/2018 Elsevier Patient Education  2020 [place] Anila Bojarski M.D.

## 2019-02-13 ENCOUNTER — Ambulatory Visit (INDEPENDENT_AMBULATORY_CARE_PROVIDER_SITE_OTHER): Payer: 59 | Admitting: Internal Medicine

## 2019-02-13 ENCOUNTER — Encounter: Payer: Self-pay | Admitting: Internal Medicine

## 2019-02-13 VITALS — BP 110/58 | HR 66 | Temp 99.7°F | Ht 61.0 in | Wt 136.0 lb

## 2019-02-13 DIAGNOSIS — R739 Hyperglycemia, unspecified: Secondary | ICD-10-CM | POA: Diagnosis not present

## 2019-02-13 DIAGNOSIS — Z0000 Encounter for general adult medical examination without abnormal findings: Secondary | ICD-10-CM | POA: Diagnosis not present

## 2019-02-13 DIAGNOSIS — Z79899 Other long term (current) drug therapy: Secondary | ICD-10-CM | POA: Diagnosis not present

## 2019-02-13 LAB — POCT GLYCOSYLATED HEMOGLOBIN (HGB A1C): Hemoglobin A1C: 5.4 % (ref 4.0–5.6)

## 2019-02-13 NOTE — Patient Instructions (Addendum)
Continue your exercises   At this time  If feeling ok no need to get the glucose tolerance testing  If sugars are ok .  Lab Results  Component Value Date   WBC 8.2 08/01/2018   HGB 13.5 08/01/2018   HCT 40.0 08/01/2018   PLT 333.0 08/01/2018   GLUCOSE 132 (H) 08/01/2018   CHOL 176 08/01/2018   TRIG 135.0 08/01/2018   HDL 56.40 08/01/2018   LDLDIRECT 101.0 10/30/2016   LDLCALC 93 08/01/2018   ALT 31 08/01/2018   AST 25 08/01/2018   NA 140 08/01/2018   K 4.7 08/01/2018   CL 107 08/01/2018   CREATININE 1.00 08/01/2018   BUN 26 (H) 08/01/2018   CO2 25 08/01/2018   TSH 1.45 08/01/2018   HGBA1C 5.5 08/01/2018        Preventive Care 5-44 Years Old, Female Preventive care refers to visits with your health care provider and lifestyle choices that can promote health and wellness. This includes:  A yearly physical exam. This may also be called an annual well check.  Regular dental visits and eye exams.  Immunizations.  Screening for certain conditions.  Healthy lifestyle choices, such as eating a healthy diet, getting regular exercise, not using drugs or products that contain nicotine and tobacco, and limiting alcohol use. What can I expect for my preventive care visit? Physical exam Your health care provider will check your:  Height and weight. This may be used to calculate body mass index (BMI), which tells if you are at a healthy weight.  Heart rate and blood pressure.  Skin for abnormal spots. Counseling Your health care provider may ask you questions about your:  Alcohol, tobacco, and drug use.  Emotional well-being.  Home and relationship well-being.  Sexual activity.  Eating habits.  Work and work Statistician.  Method of birth control.  Menstrual cycle.  Pregnancy history. What immunizations do I need?  Influenza (flu) vaccine  This is recommended every year. Tetanus, diphtheria, and pertussis (Tdap) vaccine  You may need a Td booster  every 10 years. Varicella (chickenpox) vaccine  You may need this if you have not been vaccinated. Zoster (shingles) vaccine  You may need this after age 37. Measles, mumps, and rubella (MMR) vaccine  You may need at least one dose of MMR if you were born in 1957 or later. You may also need a second dose. Pneumococcal conjugate (PCV13) vaccine  You may need this if you have certain conditions and were not previously vaccinated. Pneumococcal polysaccharide (PPSV23) vaccine  You may need one or two doses if you smoke cigarettes or if you have certain conditions. Meningococcal conjugate (MenACWY) vaccine  You may need this if you have certain conditions. Hepatitis A vaccine  You may need this if you have certain conditions or if you travel or work in places where you may be exposed to hepatitis A. Hepatitis B vaccine  You may need this if you have certain conditions or if you travel or work in places where you may be exposed to hepatitis B. Haemophilus influenzae type b (Hib) vaccine  You may need this if you have certain conditions. Human papillomavirus (HPV) vaccine  If recommended by your health care provider, you may need three doses over 6 months. You may receive vaccines as individual doses or as more than one vaccine together in one shot (combination vaccines). Talk with your health care provider about the risks and benefits of combination vaccines. What tests do I need? Blood tests  Lipid and cholesterol levels. These may be checked every 5 years, or more frequently if you are over 29 years old.  Hepatitis C test.  Hepatitis B test. Screening  Lung cancer screening. You may have this screening every year starting at age 38 if you have a 30-pack-year history of smoking and currently smoke or have quit within the past 15 years.  Colorectal cancer screening. All adults should have this screening starting at age 39 and continuing until age 68. Your health care provider  may recommend screening at age 39 if you are at increased risk. You will have tests every 1-10 years, depending on your results and the type of screening test.  Diabetes screening. This is done by checking your blood sugar (glucose) after you have not eaten for a while (fasting). You may have this done every 1-3 years.  Mammogram. This may be done every 1-2 years. Talk with your health care provider about when you should start having regular mammograms. This may depend on whether you have a family history of breast cancer.  BRCA-related cancer screening. This may be done if you have a family history of breast, ovarian, tubal, or peritoneal cancers.  Pelvic exam and Pap test. This may be done every 3 years starting at age 72. Starting at age 37, this may be done every 5 years if you have a Pap test in combination with an HPV test. Other tests  Sexually transmitted disease (STD) testing.  Bone density scan. This is done to screen for osteoporosis. You may have this scan if you are at high risk for osteoporosis. Follow these instructions at home: Eating and drinking  Eat a diet that includes fresh fruits and vegetables, whole grains, lean protein, and low-fat dairy.  Take vitamin and mineral supplements as recommended by your health care provider.  Do not drink alcohol if: ? Your health care provider tells you not to drink. ? You are pregnant, may be pregnant, or are planning to become pregnant.  If you drink alcohol: ? Limit how much you have to 0-1 drink a day. ? Be aware of how much alcohol is in your drink. In the [place] one drink equals one 12 oz bottle of beer (355 mL), one 5 oz glass of wine (148 mL), or one 1 oz glass of hard liquor (44 mL). Lifestyle  Take daily care of your teeth and gums.  Stay active. Exercise for at least 30 minutes on 5 or more days each week.  Do not use any products that contain nicotine or tobacco, such as cigarettes, e-cigarettes, and chewing tobacco.  If you need help quitting, ask your health care provider.  If you are sexually active, practice safe sex. Use a condom or other form of birth control (contraception) in order to prevent pregnancy and STIs (sexually transmitted infections).  If told by your health care provider, take low-dose aspirin daily starting at age 77. What's next?  Visit your health care provider once a year for a well check visit.  Ask your health care provider how often you should have your eyes and teeth checked.  Stay up to date on all vaccines. This information is not intended to replace advice given to you by your health care provider. Make sure you discuss any questions you have with your health care provider. Document Released: 08/12/2015 Document Revised: 03/27/2018 Document Reviewed: 03/27/2018 Elsevier Patient Education  2020 [employer].

## 2019-02-16 ENCOUNTER — Encounter: Payer: Self-pay | Admitting: Physical Therapy

## 2019-02-16 ENCOUNTER — Ambulatory Visit: Payer: 59 | Admitting: Physical Therapy

## 2019-02-16 ENCOUNTER — Other Ambulatory Visit: Payer: Self-pay

## 2019-02-16 DIAGNOSIS — R2689 Other abnormalities of gait and mobility: Secondary | ICD-10-CM | POA: Diagnosis not present

## 2019-02-16 DIAGNOSIS — R252 Cramp and spasm: Secondary | ICD-10-CM

## 2019-02-16 DIAGNOSIS — M25552 Pain in left hip: Secondary | ICD-10-CM

## 2019-02-16 NOTE — Therapy (Signed)
[facility] [address] [place], [place], [address] Phone: [phone]   Fax:  [phone]  Physical Therapy Treatment  Patient Details  Name: Lisa Lee MRN: [id number] Date of Birth: 1961-07-07 Referring Provider (PT): Clearance Coots, MD   Encounter Date: 02/16/2019  PT End of Session - 02/16/19 1902    Visit Number  11    Date for PT Re-Evaluation  03/04/19    PT Start Time  1900    PT Stop Time  1938    PT Time Calculation (min)  38 min    Activity Tolerance  Patient tolerated treatment well    Behavior During Therapy  [facility] for tasks assessed/performed       Past Medical History:  Diagnosis Date  . Acid reflux   . Allergic rhinitis   . Asthma   . Depression   . Diarrhea 08/09/2011   Poss ibs  Vs other  Related to stress  consdier other eval if needed. Had colonoscoopy per dr Cristina Gong   . Hemorrhoids   . High cholesterol   . HPV in female 28  . Hypothyroidism   . Medication side effect 11/01/2011   tussionex   distrubed sleep   . Thyroid disease     Past Surgical History:  Procedure Laterality Date  . CERVICAL CONIZATION W/BX N/A 07/21/2015   Procedure: CONIZATION CERVIX WITH BIOPSY;  Surgeon: Ena Dawley, MD;  Location: [facility];  Service: Gynecology;  Laterality: N/A;  . CERVICAL CONIZATION W/BX N/A 09/25/2018   Procedure: CONIZATION CERVIX WITH BIOPSY;  Surgeon: Everitt Amber, MD;  Location: [facility];  Service: Gynecology;  Laterality: N/A;  . CERVICAL CRYOTHERAPY  1984  . COLPOSCOPY  08/26/13  . DILATION AND CURETTAGE, DIAGNOSTIC / THERAPEUTIC  1994   Blighted Ovum  . HYSTEROSCOPY     uterine polypectomy Jan 7th  . HYSTEROSCOPY WITH RESECTOSCOPE  08/04/2009   Removed polyp & IUD  . LYMPH NODE BIOPSY    . TONSILLECTOMY      There were no vitals filed for this visit.  Subjective Assessment - 02/16/19 1900    Subjective  I can feel my core is stronger and I am more  aware of my posture.  I am not using lunge strategy to actually pick anything up until I get stronger.  Worked all day and was busy.    Pertinent History  injection into Lt trochanteric bursa: 01/02/19    Limitations  Walking;Sitting    How long can you sit comfortably?  5-10 minutes    How long can you walk comfortably?  antalgic, unsteady    Diagnostic tests  none    Patient Stated Goals  be pain free    Currently in Pain?  No/denies                       [facility] Adult PT Treatment/Exercise - 02/16/19 0001      Exercises   Exercises  Lumbar;Knee/Hip      Lumbar Exercises: Aerobic   Nustep  Level 3 x 7'      Lumbar Exercises: Machines for Strengthening   Other Lumbar Machine Exercise  shoulder flexion and extension x 20 reps each, standing with postural cueing and core stabilizer activation with UE overlay      Lumbar Exercises: Standing   Other Standing Lumbar Exercises  on black foam pad 3# bil UE dumbbells alt lat raise and forward raises to 90 deg  x 20 reps, PT cued lumbar mulitfidi Posterior lunges 2x5, static lunge x 5 reps, single UE hold on back of chair   Other Standing Lumbar Exercises  balance board 2x30 sec, added 2x5 lateral raises no UE weight      Lumbar Exercises: Supine   Bridge  10 reps;3 seconds    Bridge Limitations  feet on green ball    Straight Leg Raise  10 reps    Straight Leg Raises Limitations  2x5 bil, PT gave VCs for stable pelvis               PT Short Term Goals - 01/28/19 1546      PT SHORT TERM GOAL #3   Title  report < or = to 5/10 Lt LE pain with standing and walking    Status  Achieved        PT Long Term Goals - 02/16/19 1903      PT LONG TERM GOAL #1   Title  be independent in advanced HEP    Status  On-going      PT LONG TERM GOAL #2   Title  demonstrate symmetry with ambulation on level surface    Status  Achieved      PT LONG TERM GOAL #4   Title  lift and carry laundry without increased Lt LE pain     Status  On-going      PT LONG TERM GOAL #5   Title  report a 70% reduction in Lt LE pain with 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  She is compliant and manages to sprinkle her strengthening in throughout her work days.  She reports and demonstrates improved postural awareness and core use during dynamic tasks.  She needs min cueing at times for slight postural tweaks and when learning a new exercise needs to be reminded to focus on core.  She is limited in rep tolerance during lunges to 5-10 reps and reports Lt hip pain with Lt leg anterior during lunge.  Pt will continue to benefit from skilled PT along current POC with ongoing functional training and core stabilization.    Rehab Potential  Excellent    PT Frequency  2x / week    PT Duration  8 weeks    PT Treatment/Interventions  ADLs/Self Care Home Management;Electrical Stimulation;Cryotherapy;Moist Heat;Stair training;Gait training;Therapeutic activities;Therapeutic exercise;Neuromuscular re-education;Patient/family education;Manual techniques;Passive range of motion;Dry needling;Taping    PT Next Visit Plan  continue lumbar stab in neutral spine, LE functional strength, carrying/lifting, balance board and foam pad with UE weights    PT Home Exercise Plan  Access Code: [email or web address]     Consulted and Agree with Plan of Care  Patient       Patient will benefit from skilled therapeutic intervention in order to improve the following deficits and impairments:     Visit Diagnosis: 1. Other abnormalities of gait and mobility   2. Pain in left hip   3. Cramp and spasm        Problem List Patient Active Problem List   Diagnosis Date Noted  . Greater trochanteric bursitis  11/28/2018  . Atypical squamous cells cannot  exclude high grade squamous intraepithelial lesion on cytologic smear of cervix (ASC-H) 09/12/2018  . ASCUS with positive high risk HPV cervical 09/12/2018  . Insomnia 06/17/2018  . 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 Abnormal cervical Papanicolaou smear 04/03/2017  . Atrophic vaginitis 04/03/2017  . Cervical intraepithelial neoplasia grade 1 04/03/2017  . Unspecified dyspareunia (CODE) 04/03/2017  . Genital herpes simplex 04/03/2017  . 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 Menopausal symptom 04/03/2017  . Skin lesion 04/03/2017  . Hyperlipidemia 10/29/2015  . Serum calcium elevated 10/26/2014  . Recurrent UTI 02/05/2014  . Upper airway cough syndrome 10/07/2013  . Uncomplicated asthma 10/07/2013  . Gastro-esophageal reflux disease without esophagitis 10/07/2013  . Loose stools 10/07/2013  . Thumb pain 11/21/2012  . History of depression 11/21/2012  . Medication management 11/21/2012  . Lithium use 09/03/2012  . Rosacea 06/02/2012  . Medication monitoring encounter 06/02/2012  . Depression 06/02/2012  . Perimenopausal 06/02/2012  . Major depressive disorder, single episode 06/02/2012  . Cardiovascular risk factor 08/28/2011  . FH: premature coronary heart disease 08/09/2011  . ESOPHAGEAL REFLUX 08/02/2010  . PLANTAR FASCIITIS, RIGHT 08/02/2010  . OTHER DISEASES OF NASAL CAVITY AND SINUSES 04/20/2010  . HYPERLIPIDEMIA 06/15/2009  . TENDINITIS, RIGHT KNEE 08/11/2008  . Allergic rhinitis 05/14/2007  . Hypothyroidism 03/03/2007  . ECZEMA, HANDS 03/03/2007  . POSTURAL LIGHTHEADEDNESS 03/03/2007  . DEPRESSION 02/27/2007  . Cough variant asthma  vs  Eos bronchitis 02/27/2007   Shloime Keilman, PT 02/16/19 7:54 PM   [facility] [address], [address] [place], [place], [address] Phone: [phone]   Fax:  [phone]  Name: Mindi JunkerCathryn D  Lee MRN: [id number] Date of Birth: 04/24/1961

## 2019-02-18 ENCOUNTER — Other Ambulatory Visit: Payer: Self-pay

## 2019-02-18 ENCOUNTER — Ambulatory Visit: Payer: 59

## 2019-02-18 DIAGNOSIS — R2689 Other abnormalities of gait and mobility: Secondary | ICD-10-CM

## 2019-02-18 DIAGNOSIS — R252 Cramp and spasm: Secondary | ICD-10-CM

## 2019-02-18 DIAGNOSIS — M25552 Pain in left hip: Secondary | ICD-10-CM

## 2019-02-18 NOTE — Therapy (Signed)
[facility] [address], [address] [place], [place], [address] Phone: [phone]   Fax:  [phone]  Physical Therapy Treatment  Patient Details  Name: Lisa Lee MRN: [id number] Date of Birth: 04/03/1961 Referring Provider (PT): Clare GandySchmitz, Jeremy, MD   Encounter Date: 02/18/2019  PT End of Session - 02/18/19 1610    Visit Number  12    Date for PT Re-Evaluation  03/04/19    PT Start Time  1530    PT Stop Time  1612    PT Time Calculation (min)  42 min    Activity Tolerance  Patient tolerated treatment well    Behavior During Therapy  [facility] for tasks assessed/performed       Past Medical History:  Diagnosis Date  . Acid reflux   . Allergic rhinitis   . Asthma   . Depression   . Diarrhea 08/09/2011   Poss ibs  Vs other  Related to stress  consdier other eval if needed. Had colonoscoopy per dr Matthias HughsBuccini   . Hemorrhoids   . High cholesterol   . HPV in female 201984  . Hypothyroidism   . Medication side effect 11/01/2011   tussionex   distrubed sleep   . Thyroid disease     Past Surgical History:  Procedure Laterality Date  . CERVICAL CONIZATION W/BX N/A 07/21/2015   Procedure: CONIZATION CERVIX WITH BIOPSY;  Surgeon: Kirkland HunArthur Stringer, MD;  Location: [facility];  Service: Gynecology;  Laterality: N/A;  . CERVICAL CONIZATION W/BX N/A 09/25/2018   Procedure: CONIZATION CERVIX WITH BIOPSY;  Surgeon: Adolphus Birchwoodossi, Emma, MD;  [facility];  Service: Gynecology;  Laterality: N/A;  . CERVICAL CRYOTHERAPY  1984  . COLPOSCOPY  08/26/13  . DILATION AND CURETTAGE, DIAGNOSTIC / THERAPEUTIC  1994   Blighted Ovum  . HYSTEROSCOPY     uterine polypectomy Jan 7th  . HYSTEROSCOPY WITH RESECTOSCOPE  08/04/2009   Removed polyp & IUD  . LYMPH NODE BIOPSY    . TONSILLECTOMY      There were no vitals filed for this visit.  Subjective Assessment - 02/18/19 1532    Subjective  I didn't work today.  I feel 95% overall  improvement.    Pertinent History  injection into Lt trochanteric bursa: 01/02/19    Currently in Pain?  No/denies         [facility] - 02/18/19 0001      Observation/Other Assessments   Focus on Therapeutic Outcomes (FOTO)   30% limitation                   [facility] Adult PT Treatment/Exercise - 02/18/19 0001      Lumbar Exercises: Stretches   Single Knee to Chest Stretch  Left;Right;3 reps;20 seconds      Lumbar Exercises: Aerobic   Nustep  Level 3 x 10'      Lumbar Exercises: Machines for Strengthening   Other Lumbar Machine Exercise  shoulder flexion and extension x 20 reps each with 15#, standing with postural cueing and core stabilizer activation with UE overlay      Lumbar Exercises: Standing   Other Standing Lumbar Exercises  on black foam pad 3# bil UE dumbbells alt lat raise and forward raises to 90 deg x 20 reps, PT cued lumbar mulitfidi      Lumbar Exercises: Supine   Ab Set  20 reps   with ball rolls   Bridge with clamshell  20 reps;5 seconds  blue band   Straight Leg Raise  10 reps    Straight Leg Raises Limitations  2x5 bil, PT gave VCs for stable pelvis               PT Short Term Goals - 01/28/19 1546      PT SHORT TERM GOAL #3   Title  report < or = to 5/10 Lt LE pain with standing and walking    Status  Achieved        PT Long Term Goals - 02/18/19 1535      PT LONG TERM GOAL #2   Title  demonstrate symmetry with ambulation on level surface    Status  Achieved      PT LONG TERM GOAL #4   Title  lift and carry laundry without increased Lt LE pain    Baseline  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  FOTO is improved to 30% limitation (40% at initial evaluation).  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  Pt with improved postural awareness with stabilization exercises. Pt requires verbal and tactile cueing with bridging and exercise on balance pad. Pt will continue to benefit from skilled PT to improve core and hip strength, improve functional mobility and reduce Lt LE pain with work tasks.    PT Frequency  2x / week    PT Duration  8 weeks    PT Treatment/Interventions  ADLs/Self Care Home Management;Electrical Stimulation;Cryotherapy;Moist Heat;Stair training;Gait training;Therapeutic activities;Therapeutic exercise;Neuromuscular re-education;Patient/family education;Manual techniques;Passive range of motion;Dry needling;Taping    PT Next Visit Plan  continue lumbar stab in neutral spine, LE functional strength, carrying/lifting, balance board and foam pad with UE weights    PT Home Exercise Plan  Access Code: [email or web address]     Consulted and Agree with Plan of Care  Patient       Patient will benefit from skilled therapeutic intervention in order to improve the following deficits and impairments:  Abnormal gait, Decreased activity tolerance, Decreased endurance, Decreased mobility, Difficulty walking, Increased muscle spasms, Pain, Obesity  Visit Diagnosis: 1. Other abnormalities of gait and mobility   2. Pain in left hip   3. Cramp and spasm        Problem List Patient Active Problem List   Diagnosis Date Noted  . Greater trochanteric bursitis 11/28/2018  . Atypical squamous cells cannot exclude high grade squamous intraepithelial lesion on cytologic smear of cervix (ASC-H) 09/12/2018  . ASCUS with positive high risk HPV cervical 09/12/2018  . Insomnia 06/17/2018  . 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  Abnormal cervical Papanicolaou smear 04/03/2017  . Atrophic vaginitis 04/03/2017  . Cervical intraepithelial neoplasia grade 1 04/03/2017  . Unspecified dyspareunia (CODE) 04/03/2017  . Genital  herpes simplex 04/03/2017  . 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 Menopausal symptom 04/03/2017  . Skin lesion 04/03/2017  . Hyperlipidemia 10/29/2015  . Serum calcium elevated 10/26/2014  . Recurrent UTI 02/05/2014  . Upper airway cough syndrome 10/07/2013  . Uncomplicated asthma 74/06/8785  . Gastro-esophageal reflux disease without esophagitis 10/07/2013  . Loose stools 10/07/2013  . Thumb pain 11/21/2012  . History of depression 11/21/2012  . Medication management 11/21/2012  . Lithium use 09/03/2012  . Rosacea 06/02/2012  . Medication monitoring encounter 06/02/2012  . Depression 06/02/2012  . Perimenopausal 06/02/2012  . Major depressive disorder, single episode 06/02/2012  . Cardiovascular risk factor 08/28/2011  . FH: premature coronary heart disease 08/09/2011  . ESOPHAGEAL REFLUX 08/02/2010  . PLANTAR FASCIITIS, RIGHT 08/02/2010  . OTHER DISEASES OF NASAL CAVITY AND SINUSES 04/20/2010  . HYPERLIPIDEMIA 06/15/2009  . TENDINITIS, RIGHT KNEE 08/11/2008  . Allergic rhinitis 05/14/2007  . Hypothyroidism 03/03/2007  . ECZEMA, HANDS 03/03/2007  . POSTURAL LIGHTHEADEDNESS 03/03/2007  . DEPRESSION 02/27/2007  . Cough variant asthma  vs  Eos bronchitis 02/27/2007   Sigurd Sos, PT 02/18/19 4:17 PM  [facility] [address] [place], [place], [address] Phone: [phone]   Fax:  [phone]  Name: Lisa Lee MRN: [id number] Date of Birth: 08-26-1960

## 2019-02-25 ENCOUNTER — Other Ambulatory Visit: Payer: Self-pay

## 2019-02-25 ENCOUNTER — Ambulatory Visit: Payer: 59

## 2019-02-25 DIAGNOSIS — R2689 Other abnormalities of gait and mobility: Secondary | ICD-10-CM

## 2019-02-25 DIAGNOSIS — R252 Cramp and spasm: Secondary | ICD-10-CM

## 2019-02-25 DIAGNOSIS — M25552 Pain in left hip: Secondary | ICD-10-CM

## 2019-02-25 NOTE — Therapy (Signed)
[facility] [address], [place], [place], [address] Phone: [phone]   Fax:  [phone]  Physical Therapy Treatment  Patient Details  Name: Lisa Lee MRN: [id number] Date of Birth: January 01, 1961 Referring Provider (PT): Clearance Coots, MD   Encounter Date: 02/25/2019  PT End of Session - 02/25/19 0918    Visit Number  13    Date for PT Re-Evaluation  03/04/19    PT Start Time  0834    PT Stop Time  0918    PT Time Calculation (min)  44 min    Activity Tolerance  Patient tolerated treatment well    Behavior During Therapy  [facility] for tasks assessed/performed       Past Medical History:  Diagnosis Date  . Acid reflux   . Allergic rhinitis   . Asthma   . Depression   . Diarrhea 08/09/2011   Poss ibs  Vs other  Related to stress  consdier other eval if needed. Had colonoscoopy per dr Cristina Gong   . Hemorrhoids   . High cholesterol   . HPV in female 73  . Hypothyroidism   . Medication side effect 11/01/2011   tussionex   distrubed sleep   . Thyroid disease     Past Surgical History:  Procedure Laterality Date  . CERVICAL CONIZATION W/BX N/A 07/21/2015   Procedure: CONIZATION CERVIX WITH BIOPSY;  Surgeon: Ena Dawley, MD;  Location: [facility];  Service: Gynecology;  Laterality: N/A;  . CERVICAL CONIZATION W/BX N/A 09/25/2018   Procedure: CONIZATION CERVIX WITH BIOPSY;  Surgeon: Everitt Amber, MD;  Location: [facility];  Service: Gynecology;  Laterality: N/A;  . CERVICAL CRYOTHERAPY  1984  . COLPOSCOPY  08/26/13  . DILATION AND CURETTAGE, DIAGNOSTIC / THERAPEUTIC  1994   Blighted Ovum  . HYSTEROSCOPY     uterine polypectomy Jan 7th  . HYSTEROSCOPY WITH RESECTOSCOPE  08/04/2009   Removed polyp & IUD  . LYMPH NODE BIOPSY    . TONSILLECTOMY      There were no vitals filed for this visit.  Subjective Assessment - 02/25/19 0836    Subjective  I feel like I have relapsed.  I am having Lt  gluteal pain, no pain down the leg.  I have done more lifting over the past week.    Currently in Pain?  Yes    Pain Score  3     Pain Location  Buttocks    Pain Orientation  Left    Pain Descriptors / Indicators  Aching    Pain Type  Chronic pain    Pain Onset  More than a month ago    Pain Frequency  Intermittent    Aggravating Factors   lifting/carrying babies at work    Pain Relieving Factors  ice, Gabapentin, medication                       [facility] Adult PT Treatment/Exercise - 02/25/19 0001      Lumbar Exercises: Stretches   Figure 4 Stretch  2 reps;Seated;20 seconds      Lumbar Exercises: Aerobic   Nustep  --      Lumbar Exercises: Machines for Strengthening   Other Lumbar Machine Exercise  shoulder flexion and extension x 20 reps each with 15#, standing with postural cueing and core stabilizer activation with UE overlay      Lumbar Exercises: Standing   Other Standing Lumbar Exercises  on black foam  pad 3# bil UE dumbbells alt lat raise and forward raises to 90 deg x 20 reps, PT cued lumbar mulitfidi      Manual Therapy   Manual Therapy  Soft tissue mobilization;Myofascial release    Manual therapy comments  soft tissue elongation and trigger point release to Lt gluteals s/p dry needling       Trigger Point Dry Needling - 02/25/19 0001    Consent Given?  Yes    Muscles Treated Back/Hip  Gluteus minimus;Gluteus medius   Lt only gluteals   Gluteus Minimus Response  Twitch response elicited;Palpable increased muscle length    Gluteus Medius Response  Twitch response elicited;Palpable increased muscle length             PT Short Term Goals - 01/28/19 1546      PT SHORT TERM GOAL #3   Title  report < or = to 5/10 Lt LE pain with standing and walking    Status  Achieved        PT Long Term Goals - 02/18/19 1535      PT LONG TERM GOAL #2   Title  demonstrate symmetry with ambulation on level surface    Status  Achieved      PT LONG TERM  GOAL #4   Title  lift and carry laundry without increased Lt LE pain    Baseline  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  Pt demonstrates neutral posture in standing with strength exercise with few verbal cues needed for technique.  Pt with increased pain in the Lt gluteals this week and demonstrated increased trigger points and tension.  Pt with improved tissue mobility and reduced pain after dry needling and manual today.  Pt will continue to benefit from skilled PT to improve endurance and hip/core strength to sustain work activities and avoid injury.    PT Frequency  2x / week    PT Duration  12 weeks    PT Treatment/Interventions  ADLs/Self Care Home Management;Electrical Stimulation;Cryotherapy;Moist Heat;Stair training;Gait training;Therapeutic activities;Therapeutic exercise;Neuromuscular re-education;Patient/family education;Manual techniques;Passive range of motion;Dry needling;Taping    PT Next Visit Plan  assess response to dry needling, LE functional strength and core    PT Home Exercise Plan  Access Code: [email or web address]     Consulted and Agree with Plan of Care  Patient       Patient will benefit from skilled therapeutic intervention in order to improve the following deficits and impairments:  Abnormal gait, Decreased activity tolerance, Decreased endurance, Decreased mobility, Difficulty walking, Increased muscle spasms, Pain, Obesity  Visit Diagnosis: 1. Pain in left hip   2. Other abnormalities of gait and mobility   3. Cramp and spasm        Problem  List Patient Active Problem List   Diagnosis Date Noted  . Greater trochanteric bursitis 11/28/2018  .  Atypical squamous cells cannot exclude high grade squamous intraepithelial lesion on cytologic smear of cervix (ASC-H) 09/12/2018  . ASCUS with positive high risk HPV cervical 09/12/2018  . Insomnia 06/17/2018  . 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 Abnormal cervical Papanicolaou smear 04/03/2017  . Atrophic vaginitis 04/03/2017  . Cervical intraepithelial neoplasia grade 1 04/03/2017  . Unspecified dyspareunia (CODE) 04/03/2017  . Genital herpes simplex 04/03/2017  . 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 Menopausal symptom 04/03/2017  . Skin lesion 04/03/2017  . Hyperlipidemia 10/29/2015  . Serum calcium elevated 10/26/2014  . Recurrent UTI 02/05/2014  . Upper airway cough syndrome 10/07/2013  . Uncomplicated asthma 10/07/2013  . Gastro-esophageal reflux disease without esophagitis 10/07/2013  . Loose stools 10/07/2013  . Thumb pain 11/21/2012  . History of depression 11/21/2012  . Medication management 11/21/2012  . Lithium use 09/03/2012  . Rosacea 06/02/2012  . Medication monitoring encounter 06/02/2012  . Depression 06/02/2012  . Perimenopausal 06/02/2012  . Major depressive disorder, single episode 06/02/2012  . Cardiovascular risk factor 08/28/2011  . FH: premature coronary heart disease 08/09/2011  . ESOPHAGEAL REFLUX 08/02/2010  . PLANTAR FASCIITIS, RIGHT 08/02/2010  . OTHER DISEASES OF NASAL CAVITY AND SINUSES 04/20/2010  . HYPERLIPIDEMIA 06/15/2009  . TENDINITIS, RIGHT KNEE 08/11/2008  . Allergic rhinitis 05/14/2007  . Hypothyroidism 03/03/2007  . ECZEMA, HANDS 03/03/2007  . POSTURAL LIGHTHEADEDNESS 03/03/2007  . DEPRESSION 02/27/2007  . Cough variant asthma  vs  Eos bronchitis 02/27/2007     Lorrene ReidKelly Josefina Rynders, PT 02/25/19 9:23 AM  [facility] [address],  [address] [place], [place], [address] Phone: [phone]   Fax:  [phone]  Name: Mindi JunkerCathryn D Conchar-Mabe MRN: [id number] Date of Birth: 08/14/1960

## 2019-02-27 ENCOUNTER — Ambulatory Visit (INDEPENDENT_AMBULATORY_CARE_PROVIDER_SITE_OTHER): Payer: 59 | Admitting: Physician Assistant

## 2019-02-27 ENCOUNTER — Encounter: Payer: Self-pay | Admitting: Physician Assistant

## 2019-02-27 ENCOUNTER — Ambulatory Visit: Payer: 59 | Admitting: Physical Therapy

## 2019-02-27 ENCOUNTER — Other Ambulatory Visit: Payer: Self-pay

## 2019-02-27 DIAGNOSIS — F319 Bipolar disorder, unspecified: Secondary | ICD-10-CM | POA: Diagnosis not present

## 2019-02-27 DIAGNOSIS — G4700 Insomnia, unspecified: Secondary | ICD-10-CM | POA: Diagnosis not present

## 2019-02-27 MED ORDER — MODAFINIL 200 MG PO TABS: 100.0000 mg | ORAL_TABLET | Freq: Every day | ORAL | 5 refills | Status: DC

## 2019-02-27 MED ORDER — LITHIUM CARBONATE ER 300 MG PO TBCR: 300.0000 mg | EXTENDED_RELEASE_TABLET | Freq: Two times a day (BID) | ORAL | 1 refills | Status: DC

## 2019-02-27 MED ORDER — ESCITALOPRAM OXALATE 20 MG PO TABS: 20.0000 mg | ORAL_TABLET | Freq: Every day | ORAL | 1 refills | Status: DC

## 2019-02-27 NOTE — Progress Notes (Signed)
[facility]  Patient ID: Lisa JunkerCathryn D Lee,  MRN: [id number]  PCP: Madelin HeadingsPanosh, Wanda K, MD  Date of Evaluation: 02/27/2019 Time spent:15 minutes  Chief Complaint:  Chief Complaint    Follow-up     Virtual Visit via Telephone Note  I connected with patient by a video enabled telemedicine application or telephone, with their informed consent, and verified patient privacy and that I am speaking with the correct person using two identifiers.  I am private, in my home and the patient is home.   I discussed the limitations, risks, security and privacy concerns of performing an evaluation and management service by telephone and the availability of in person appointments. I also discussed with the patient that there may be a patient responsible charge related to this service. The patient expressed understanding and agreed to proceed.   I discussed the assessment and treatment plan with the patient. The patient was provided an opportunity to ask questions and all were answered. The patient agreed with the plan and demonstrated an understanding of the instructions.   The patient was advised to call back or seek an in-person evaluation if the symptoms worsen or if the condition fails to improve as anticipated.  I provided 15 minutes of non-face-to-face time during this encounter.  HISTORY/CURRENT STATUS: HPI For 6 month med check.  States she's doing well.  Feels like her meds are working well.  Work is going fine.  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  The pandemic has affected her only by the PPE she has to wear daily.  She has not been ill.  No one in her family has been sick.  Denies increased energy with decreased need for sleep, no impulsivity or risky behavior, no increased spending or libido, no grandiosity.  No hallucinations.  Patient denies loss of interest in usual activities and is able to enjoy things.  Denies decreased energy or motivation.  Appetite  has not changed.  No extreme sadness, tearfulness, or feelings of hopelessness.  Denies any changes in concentration, making decisions or remembering things.  Denies suicidal or homicidal thoughts.  The modafinil has really helped her staying on task and get things done when she is at work.  She does not take it daily and when she does it is usually only 50 mg, because she cannot tolerate much more than that.  Denies dizziness, syncope, seizures, numbness, tingling, tremor, tics, unsteady gait, slurred speech, confusion. Denies muscle or joint pain, stiffness, or dystonia.  Individual Medical History/ Review of Systems: Changes? :No   She had the flu since last visit.  See labs from January 2020 on the chart.  TSH was normal.  Creatinine was 1.0.  Past medications for mental health diagnoses include: Lexapro, lithium  Allergies: Penicillins, Atorvastatin, Pravastatin, and Rosuvastatin  Current Medications:  Current Outpatient Medications:  .  albuterol (PROAIR HFA) 108 (90 Base) MCG/ACT inhaler, Inhale 2 puffs into the lungs every 6 (six) hours as needed for wheezing., Disp: 3 Inhaler, Rfl: 0 .  aspirin EC 81 MG tablet, Take 1 tablet (81 mg total) by mouth daily., Disp: , Rfl:  .  atorvastatin (LIPITOR) 10 MG tablet, TAKE 1 TABLET DAILY, Disp: 90 tablet, Rfl: 3 .  Cholecalciferol (VITAMIN D) 2000 units tablet, Take 2,000 Units by mouth daily., Disp: , Rfl:  .  dextromethorphan-guaiFENesin (MUCINEX DM) 30-600 MG 12hr tablet, Take 1 tablet by mouth 2 (two) times daily as needed for cough., Disp: , Rfl:  .  doxylamine, Sleep, (UNISOM) 25 MG tablet, Take 25 mg by mouth at bedtime as needed., Disp: , Rfl:  .  escitalopram (LEXAPRO) 20 MG tablet, Take 1 tablet (20 mg total) by mouth daily., Disp: 90 tablet, Rfl: 1 .  famotidine (PEPCID) 20 MG tablet, TAKE 1 TABLET AT BEDTIME, Disp: 90 tablet, Rfl: 4 .  fenofibrate 160 MG tablet, TAKE 1 TABLET BY MOUTH EVERY DAY, Disp: 90 tablet, Rfl: 0 .   gabapentin (NEURONTIN) 100 MG capsule, Take 1 capsule (100 mg total) by mouth 3 (three) times daily. (Patient taking differently: Take 100 mg by mouth at bedtime. ), Disp: 90 capsule, Rfl: 3 .  levothyroxine (SYNTHROID) 75 MCG tablet, TAKE 1 TABLET BY MOUTH EVERY DAY, Disp: 90 tablet, Rfl: 3 .  lithium carbonate (LITHOBID) 300 MG CR tablet, Take 1 tablet (300 mg total) by mouth 2 (two) times daily., Disp: 180 tablet, Rfl: 1 .  loperamide (IMODIUM A-D) 2 MG tablet, Take 1 mg by mouth daily. , Disp: , Rfl:  .  modafinil (PROVIGIL) 200 MG tablet, Take 0.5 tablets (100 mg total) by mouth daily., Disp: 30 tablet, Rfl: 5 .  Multiple Vitamins-Minerals (MULTIVITAMIN WITH MINERALS) tablet, Take 1 tablet by mouth daily., Disp: , Rfl:  .  pantoprazole (PROTONIX) 40 MG tablet, TAKE 1 TABLET DAILY, Disp: 90 tablet, Rfl: 4 .  valACYclovir (VALTREX) 500 MG tablet, valacyclovir 500 mg tablet  For outbreak: Take one tablet by oral route twice a day for 3 days. For suppression: Take one tablet by oral route daily., Disp: , Rfl:  .  Wheat Dextrin (BENEFIBER) POWD, Take 2 heaping teaspoons by mouth daily, Disp: , Rfl:  .  YUVAFEM 10 MCG TABS vaginal tablet, Place 1 tablet vaginally 2 (two) times a week. , Disp: , Rfl:  .  ibuprofen (ADVIL,MOTRIN) 800 MG tablet, Take 1 tablet (800 mg total) by mouth every 8 (eight) hours as needed for moderate pain. For AFTER surgery, Disp: 30 tablet, Rfl: 1 .  Ibuprofen-Famotidine 800-26.6 MG TABS, Take 1 tablet by mouth 3 (three) times daily., Disp: 90 tablet, Rfl: 3 Medication Side Effects: none  Family Medical/ Social History: Changes? No  MENTAL HEALTH EXAM:  Last menstrual period 12/09/2013.There is no height or weight on file to calculate BMI.  General Appearance: unable to assess  Eye Contact:  unable to assess  Speech:  Clear and Coherent  Volume:  Normal  Mood:  Euthymic  Affect:  unable to assess  Thought Process:  Goal Directed  Orientation:  Full (Time, Place, and  Person)  Thought Content: Logical   Suicidal Thoughts:  No  Homicidal Thoughts:  No  Memory:  WNL  Judgement:  Good  Insight:  Good  Psychomotor Activity:  unable to assess  Concentration:  Concentration: Good  Recall:  Good  Fund of Knowledge: Good  Language: Good  Assets:  Desire for Improvement  ADL's:  Intact  Cognition: WNL  Prognosis:  Good    DIAGNOSES:    ICD-10-CM   1. Bipolar I disorder (HCC)  F31.9   2. Insomnia, unspecified type  G47.00     Receiving Psychotherapy: No    RECOMMENDATIONS:  Continue Lexapro 20 mg daily. Continue Lithobid 300 mg 1 p.o. twice daily. Continue modafinil 200 mg, she only takes one fourth p.o. every morning as needed.  Occasionally will take 1/2 pill.  PDMP was reviewed. Continue Unisom as needed sleep. She will have her PCP draw a lithium level within the next month or so.  Return in 6 months.  Donnal Moat, PA-C   This record has been created using [employer].  Chart creation errors have been sought, but may not always have been located and corrected. Such creation errors do not reflect on the standard of medical care.

## 2019-03-04 ENCOUNTER — Other Ambulatory Visit: Payer: Self-pay

## 2019-03-04 ENCOUNTER — Ambulatory Visit: Payer: 59 | Attending: Family Medicine

## 2019-03-04 DIAGNOSIS — R2689 Other abnormalities of gait and mobility: Secondary | ICD-10-CM | POA: Insufficient documentation

## 2019-03-04 DIAGNOSIS — R252 Cramp and spasm: Secondary | ICD-10-CM | POA: Diagnosis present

## 2019-03-04 DIAGNOSIS — M6281 Muscle weakness (generalized): Secondary | ICD-10-CM | POA: Diagnosis present

## 2019-03-04 DIAGNOSIS — M25552 Pain in left hip: Secondary | ICD-10-CM | POA: Insufficient documentation

## 2019-03-04 NOTE — Therapy (Signed)
[facility] [address] [place], [place], [address] Phone: [phone]   Fax:  [phone]  Physical Therapy Treatment  Patient Details  Name: Lisa Lee MRN: [id number] Date of Birth: August 12, 1960 Referring Provider (PT): Clearance Coots, MD   Encounter Date: 03/04/2019  PT End of Session - 03/04/19 1014    Visit Number  14    Date for PT Re-Evaluation  04/15/19    PT Start Time  0930    PT Stop Time  1016    PT Time Calculation (min)  46 min    Activity Tolerance  Patient tolerated treatment well    Behavior During Therapy  [facility] for tasks assessed/performed       Past Medical History:  Diagnosis Date  . Acid reflux   . Allergic rhinitis   . Asthma   . Depression   . Diarrhea 08/09/2011   Poss ibs  Vs other  Related to stress  consdier other eval if needed. Had colonoscoopy per dr Cristina Gong   . Hemorrhoids   . High cholesterol   . 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 Hypothyroidism   . Medication side effect 11/01/2011   tussionex   distrubed sleep   . Thyroid disease     Past Surgical History:  Procedure Laterality Date  . CERVICAL CONIZATION W/BX N/A 07/21/2015   Procedure: CONIZATION CERVIX WITH BIOPSY;  Surgeon: Ena Dawley, MD;  Location: [facility];  Service: Gynecology;  Laterality: N/A;  . CERVICAL CONIZATION W/BX N/A 09/25/2018   Procedure: CONIZATION CERVIX WITH BIOPSY;  Surgeon: Everitt Amber, MD;  Location: [facility];  Service: Gynecology;  Laterality: N/A;  . CERVICAL CRYOTHERAPY  1984  . COLPOSCOPY  08/26/13  . DILATION AND CURETTAGE, DIAGNOSTIC / THERAPEUTIC  1994   Blighted Ovum  . HYSTEROSCOPY     uterine polypectomy Jan 7th  . HYSTEROSCOPY WITH RESECTOSCOPE  08/04/2009   Removed polyp & IUD  . LYMPH NODE BIOPSY    . TONSILLECTOMY      There were no vitals filed for this visit.  Subjective Assessment - 03/04/19 0939    Subjective  I felt good after needling last session.  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    Pertinent History  injection into Lt trochanteric bursa: 01/02/19    Currently in Pain?  Yes    Pain Score  2     Pain Location  Buttocks    Pain Orientation  Left    Pain Descriptors / Indicators  Aching    Pain Type  Chronic pain    Pain Onset  More than a month ago    Pain Frequency  Intermittent    Aggravating Factors   in the morning, liftin/carrying babies at work    Pain Relieving Factors  ice, Gabapentin, medication         [facility] PT Assessment - 03/04/19 0001      Assessment   Medical Diagnosis  Greater trochanteric bursitis of Lt hip    Referring Provider (PT)  Clearance Coots, MD    Onset Date/Surgical Date  11/07/18      Prior Function   Level of Independence  Independent    Vocation  Full time employment    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      Cognition   Overall Cognitive Status  Within Functional Limits for tasks assessed      Observation/Other Assessments   Focus  on Therapeutic Outcomes (FOTO)   30% limitation                   [facility] Adult PT Treatment/Exercise - 03/04/19 0001      Lumbar Exercises: Stretches   Single Knee to Chest Stretch  Left;Right;3 reps;20 seconds    Figure 4 Stretch  2 reps;Seated;20 seconds      Lumbar Exercises: Aerobic   Nustep  Level 4 x 10'      Lumbar Exercises: Machines for Strengthening   Other Lumbar Machine Exercise  shoulder flexion and extension x 20 reps each with 15#, standing with postural cueing and core stabilizer activation with UE overlay      Lumbar Exercises: Standing   Other Standing Lumbar Exercises  on black foam pad 3# bil UE dumbbells alt lat raise and forward raises to 90 deg x 20 reps, PT cued lumbar mulitfidi      Lumbar Exercises: Supine   Ab Set  20 reps   with ball rolls   Bridge  10 reps;3 seconds    Bridge Limitations  feet on green ball               PT Short Term Goals - 01/28/19 1546      PT  SHORT TERM GOAL #3   Title  report < or = to 5/10 Lt LE pain with standing and walking    Status  Achieved        PT Long Term Goals - 03/04/19 [id number]      PT LONG TERM GOAL #1   Title  be independent in advanced HEP    Time  6    Period  Weeks    Status  On-going    Target Date  04/15/19      PT LONG TERM GOAL #2   Title  demonstrate symmetry with ambulation on level surface    Status  Achieved      PT LONG TERM GOAL #3   Title  reduce FOTO to < or = to 25% limitation    Baseline  30% limitation    Time  6    Period  Weeks    Status  On-going      PT LONG TERM GOAL #4   Title  lift and carry laundry without increased Lt LE pain    Baseline  no pain with laundry    Status  Achieved      PT LONG TERM GOAL #5   Title  report a 90% reduction in Lt LE pain with care of child at work    Baseline  80%    Time  6    Period  Weeks    Status  Revised    Target Date  04/29/19      Additional Long Term Goals   Additional Long Term Goals  Yes      PT LONG TERM GOAL #6   Title  improve core and hip strength to perform gardening without limitation    Time  6    Period  Weeks    Status  New    Target Date  04/15/19            Plan - 03/04/19 1007    Clinical Impression Statement  Pt reports 0-4/10 Lt hip pain with work and home tasks.  Pt reports 85% overall improvement in pain since the start of care.  Pt is still challenged with carrying and holding the children  that she cares for at work.  Pt is working to improve her hip and core strength to improve this.  Pt is now able to lift and carry laundry without pain or difficulty.  Pt demonstrates hip and core instability with exercises in the clinic and is able to tolerate increased challenge with these exercises.  Pt will continue to benefit from skilled PT to improve hip and core strength needed to perform gardening and work tasks.    Rehab Potential  Excellent    PT Frequency  1x / week    PT Duration  6 weeks    PT  Treatment/Interventions  ADLs/Self Care Home Management;Electrical Stimulation;Cryotherapy;Moist Heat;Stair training;Gait training;Therapeutic activities;Therapeutic exercise;Neuromuscular re-education;Patient/family education;Manual techniques;Passive range of motion;Dry needling;Taping    PT Next Visit Plan  Continue to build strength needed for work and gardening    PT Home Exercise Plan  Access Code: [email or web address]     Consulted and Agree with Plan of Care  Patient       Patient will benefit from skilled therapeutic intervention in order to improve the following deficits and impairments:  Abnormal gait, Decreased activity tolerance, Decreased endurance, Decreased mobility, Difficulty walking, Increased muscle spasms, Pain, Obesity  Visit Diagnosis: 1. Pain in left hip   2. Other abnormalities of gait and mobility   3. Cramp and spasm   4. Muscle weakness (generalized)        Problem List Patient Active Problem List   Diagnosis Date Noted  . Greater trochanteric bursitis 11/28/2018  . Atypical squamous cells cannot exclude high grade squamous intraepithelial lesion on cytologic smear of cervix (ASC-H) 09/12/2018  . ASCUS with positive high risk HPV cervical 09/12/2018  . Insomnia 06/17/2018  . 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 Abnormal cervical Papanicolaou smear 04/03/2017  . Atrophic vaginitis 04/03/2017  . Cervical intraepithelial neoplasia grade 1 04/03/2017  . Unspecified dyspareunia (CODE) 04/03/2017  . Genital herpes simplex 04/03/2017  . 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 Menopausal symptom 04/03/2017  . Skin lesion 04/03/2017  . Hyperlipidemia 10/29/2015  . Serum calcium elevated 10/26/2014  . Recurrent UTI 02/05/2014  . Upper airway cough syndrome 10/07/2013  . Uncomplicated asthma 10/07/2013  . Gastro-esophageal reflux disease without esophagitis 10/07/2013  . Loose stools 10/07/2013  . Thumb pain 11/21/2012  . History  of depression 11/21/2012  . Medication management 11/21/2012  . Lithium use 09/03/2012  . Rosacea 06/02/2012  . Medication monitoring encounter 06/02/2012  . Depression 06/02/2012  . Perimenopausal 06/02/2012  . Major depressive disorder, single episode 06/02/2012  . Cardiovascular risk factor 08/28/2011  . FH: premature coronary heart disease 08/09/2011  . ESOPHAGEAL REFLUX 08/02/2010  . PLANTAR FASCIITIS, RIGHT 08/02/2010  . OTHER DISEASES OF NASAL CAVITY AND SINUSES 04/20/2010  . HYPERLIPIDEMIA 06/15/2009  . TENDINITIS, RIGHT KNEE 08/11/2008  . Allergic rhinitis 05/14/2007  . Hypothyroidism 03/03/2007  . ECZEMA, HANDS 03/03/2007  . POSTURAL LIGHTHEADEDNESS 03/03/2007  . DEPRESSION 02/27/2007  . Cough variant asthma  vs  Eos bronchitis 02/27/2007     Lorrene ReidKelly Bonni Neuser, PT 03/04/19 10:16 AM  [facility] [address], [address] [place], [place], [address] Phone: [phone]   Fax:  [phone]  Name: Lisa Lee MRN: [id number] Date of Birth: 01/08/1961

## 2019-03-11 ENCOUNTER — Other Ambulatory Visit: Payer: Self-pay

## 2019-03-11 ENCOUNTER — Ambulatory Visit: Payer: 59

## 2019-03-11 DIAGNOSIS — M6281 Muscle weakness (generalized): Secondary | ICD-10-CM

## 2019-03-11 DIAGNOSIS — R2689 Other abnormalities of gait and mobility: Secondary | ICD-10-CM

## 2019-03-11 DIAGNOSIS — M25552 Pain in left hip: Secondary | ICD-10-CM

## 2019-03-11 DIAGNOSIS — R252 Cramp and spasm: Secondary | ICD-10-CM

## 2019-03-11 NOTE — Therapy (Signed)
[facility] [address], [address] [place], [place], [address] Phone: [phone]   Fax:  [phone]  Physical Therapy Treatment  Patient Details  Name: Lisa JunkerCathryn D Conchar-Mabe MRN: [id number] Date of Birth: 01/29/1961 Referring Provider (PT): Clare GandySchmitz, Jeremy, MD   Encounter Date: 03/11/2019  PT End of Session - 03/11/19 1012    Visit Number  15    Date for PT Re-Evaluation  04/15/19    PT Start Time  0932    PT Stop Time  1014    PT Time Calculation (min)  42 min    Activity Tolerance  Patient tolerated treatment well    Behavior During Therapy  [facility] for tasks assessed/performed       Past Medical History:  Diagnosis Date  . Acid reflux   . Allergic rhinitis   . Asthma   . Depression   . Diarrhea 08/09/2011   Poss ibs  Vs other  Related to stress  consdier other eval if needed. Had colonoscoopy per dr Matthias HughsBuccini   . Hemorrhoids   . High cholesterol   . HPV in female 341984  . Hypothyroidism   . Medication side effect 11/01/2011   tussionex   distrubed sleep   . Thyroid disease     Past Surgical History:  Procedure Laterality Date  . CERVICAL CONIZATION W/BX N/A 07/21/2015   Procedure: CONIZATION CERVIX WITH BIOPSY;  Surgeon: Kirkland HunArthur Stringer, MD;  Location: [facility];  Service: Gynecology;  Laterality: N/A;  . CERVICAL CONIZATION W/BX N/A 09/25/2018   Procedure: CONIZATION CERVIX WITH BIOPSY;  Surgeon: Adolphus Birchwoodossi, Emma, MD;  [facility];  Service: Gynecology;  Laterality: N/A;  . CERVICAL CRYOTHERAPY  1984  . COLPOSCOPY  08/26/13  . DILATION AND CURETTAGE, DIAGNOSTIC / THERAPEUTIC  1994   Blighted Ovum  . HYSTEROSCOPY     uterine polypectomy Jan 7th  . HYSTEROSCOPY WITH RESECTOSCOPE  08/04/2009   Removed polyp & IUD  . LYMPH NODE BIOPSY    . TONSILLECTOMY      There were no vitals filed for this visit.  Subjective Assessment - 03/11/19 0945    Subjective  I have had more pain down my Lt leg this  week.  Increased lifting at work.    Pertinent History  injection into Lt trochanteric bursa: 01/02/19    Currently in Pain?  Yes    Pain Score  1     Pain Location  Buttocks    Pain Orientation  Left    Pain Descriptors / Indicators  Aching    Pain Type  Chronic pain    Pain Onset  More than a month ago    Pain Frequency  Intermittent    Aggravating Factors   in the morning, lifting/carrying, getting in/out of the car    Pain Relieving Factors  ice, Gabapentin, rest                       [facility] Adult PT Treatment/Exercise - 03/11/19 0001      Lumbar Exercises: Stretches   Single Knee to Chest Stretch  Left;Right;3 reps;20 seconds    Figure 4 Stretch  2 reps;Seated;20 seconds      Lumbar Exercises: Aerobic   Nustep  Level 4 x 10'   PT present to discuss progress     Lumbar Exercises: Machines for Strengthening   Other Lumbar Machine Exercise  shoulder flexion and extension x 20 reps each with 15#, standing with postural  cueing and core stabilizer activation with UE overlay   standing on black pad     Lumbar Exercises: Standing   Other Standing Lumbar Exercises  on black foam pad 3# bil UE dumbbells alt lat raise and forward raises to 90 deg x 20 reps, PT cued lumbar mulitfidi      Lumbar Exercises: Supine   Bridge  10 reps;3 seconds    Bridge Limitations  feet on red ball      Knee/Hip Exercises: Standing   Forward Step Up  Left;2 sets;10 reps    Forward Step Up Limitations  on Bosu               PT Short Term Goals - 01/28/19 1546      PT SHORT TERM GOAL #3   Title  report < or = to 5/10 Lt LE pain with standing and walking    Status  Achieved        PT Long Term Goals - 03/04/19 [id number]      PT LONG TERM GOAL #1   Title  be independent in advanced HEP    Time  6    Period  Weeks    Status  On-going    Target Date  04/15/19      PT LONG TERM GOAL #2   Title  demonstrate symmetry with ambulation on level surface    Status  Achieved       PT LONG TERM GOAL #3   Title  reduce FOTO to < or = to 25% limitation    Baseline  30% limitation    Time  6    Period  Weeks    Status  On-going      PT LONG TERM GOAL #4   Title  lift and carry laundry without increased Lt LE pain    Baseline  no pain with laundry    Status  Achieved      PT LONG TERM GOAL #5   Title  report a 90% reduction in Lt LE pain with care of child at work    Baseline  80%    Time  6    Period  Weeks    Status  Revised    Target Date  04/29/19      Additional Long Term Goals   Additional Long Term Goals  Yes      PT LONG TERM GOAL #6   Title  improve core and hip strength to perform gardening without limitation    Time  6    Period  Weeks    Status  New    Target Date  04/15/19            Plan - 03/11/19 0955    Clinical Impression Statement  Pt reports 0-4/10 Lt hip pain with work and home tasks over the past week.  Pt reports intermittent Lt radiculopathy this week that was previously resolved.  Pt reports 85% overall improvement in pain since the start of care.  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  Pt is working to improve her hip and core strength to improve this.  Pt is now able to lift and carry laundry without pain or difficulty.  Pt demonstrates hip and core instability with exercises in the clinic and is able to tolerate increased challenge with these exercises including addition of black balance pad for shoulder flexion and extension exercises. Pt will continue to  benefit from skilled PT to improve hip and core strength needed to perform gardening and work tasks.    PT Frequency  1x / week    PT Duration  6 weeks    PT Treatment/Interventions  ADLs/Self Care Home Management;Electrical Stimulation;Cryotherapy;Moist Heat;Stair training;Gait training;Therapeutic activities;Therapeutic exercise;Neuromuscular re-education;Patient/family education;Manual techniques;Passive range of motion;Dry  needling;Taping    PT Next Visit Plan  Continue to build strength needed for work and gardening    PT Home Exercise Plan  Access Code: [email or web address]     Recommended Other Services  cert and recert signed    Consulted and Agree with Plan of Care  Patient       Patient will benefit from skilled therapeutic intervention in order to improve the following deficits and impairments:  Abnormal gait, Decreased activity tolerance, Decreased endurance, Decreased mobility, Difficulty walking, Increased muscle spasms, Pain, Obesity  Visit Diagnosis: 1. Pain in left hip   2. Other abnormalities of gait and mobility   3. Cramp and spasm   4. Muscle weakness (generalized)        Problem List Patient Active Problem List   Diagnosis Date Noted  . Greater trochanteric bursitis 11/28/2018  . Atypical squamous cells cannot exclude high grade squamous intraepithelial lesion on cytologic smear of cervix (ASC-H) 09/12/2018  . ASCUS with positive high risk HPV cervical 09/12/2018  . Insomnia 06/17/2018  . Depressed bipolar I disorder in partial remission ([place]) 06/17/2018  . Abnormal cervical Papanicolaou smear 04/03/2017  . Atrophic vaginitis 04/03/2017  . Cervical intraepithelial neoplasia grade 1 04/03/2017  . Unspecified dyspareunia (CODE) 04/03/2017  . Genital herpes simplex 04/03/2017  . 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 Menopausal symptom 04/03/2017  . Skin lesion 04/03/2017  . Hyperlipidemia 10/29/2015  . Serum calcium elevated 10/26/2014  . Recurrent UTI 02/05/2014  . Upper airway cough syndrome 10/07/2013  . Uncomplicated asthma 91/47/8295  . Gastro-esophageal reflux disease without esophagitis 10/07/2013  . Loose stools 10/07/2013  . Thumb pain 11/21/2012  . History of depression 11/21/2012  . Medication management 11/21/2012  . Lithium use 09/03/2012  . Rosacea 06/02/2012  . Medication monitoring encounter 06/02/2012  . Depression 06/02/2012  . Perimenopausal 06/02/2012  .  Major depressive disorder, single episode 06/02/2012  . Cardiovascular risk factor 08/28/2011  . FH: premature coronary heart disease 08/09/2011  . ESOPHAGEAL REFLUX 08/02/2010  . PLANTAR FASCIITIS, RIGHT 08/02/2010  . OTHER DISEASES OF NASAL CAVITY AND SINUSES 04/20/2010  . HYPERLIPIDEMIA 06/15/2009  . TENDINITIS, RIGHT KNEE 08/11/2008  . Allergic rhinitis 05/14/2007  . Hypothyroidism 03/03/2007  . ECZEMA, HANDS 03/03/2007  . POSTURAL LIGHTHEADEDNESS 03/03/2007  . DEPRESSION 02/27/2007  . Cough variant asthma  vs  Eos bronchitis 02/27/2007     Sigurd Sos, PT 03/11/19 10:15 AM  [facility] [address] [place], [place], [address] Phone: [phone]   Fax:  [phone]  Name: EILEE SCHADER MRN: [id number] Date of Birth: 01/19/1961

## 2019-03-18 ENCOUNTER — Ambulatory Visit: Payer: 59

## 2019-03-18 ENCOUNTER — Other Ambulatory Visit: Payer: Self-pay

## 2019-03-18 DIAGNOSIS — M25552 Pain in left hip: Secondary | ICD-10-CM | POA: Diagnosis not present

## 2019-03-18 DIAGNOSIS — M6281 Muscle weakness (generalized): Secondary | ICD-10-CM

## 2019-03-18 DIAGNOSIS — R2689 Other abnormalities of gait and mobility: Secondary | ICD-10-CM

## 2019-03-18 NOTE — Therapy (Signed)
[facility] [address], [address] [place], [place], [address] Phone: [phone]   Fax:  [phone]  Physical Therapy Treatment  Patient Details  Name: Lisa Lee MRN: [id number] Date of Birth: 02/23/1961 Referring Provider (PT): Clare GandySchmitz, Jeremy, MD   Encounter Date: 03/18/2019  PT End of Session - 03/18/19 1012    Visit Number  16    Date for PT Re-Evaluation  04/15/19    PT Start Time  0933    PT Stop Time  1015    PT Time Calculation (min)  42 min    Activity Tolerance  Patient tolerated treatment well    Behavior During Therapy  [facility] for tasks assessed/performed       Past Medical History:  Diagnosis Date  . Acid reflux   . Allergic rhinitis   . Asthma   . Depression   . Diarrhea 08/09/2011   Poss ibs  Vs other  Related to stress  consdier other eval if needed. Had colonoscoopy per dr Matthias HughsBuccini   . Hemorrhoids   . High cholesterol   . HPV in female 721984  . Hypothyroidism   . Medication side effect 11/01/2011   tussionex   distrubed sleep   . Thyroid disease     Past Surgical History:  Procedure Laterality Date  . CERVICAL CONIZATION W/BX N/A 07/21/2015   Procedure: CONIZATION CERVIX WITH BIOPSY;  Surgeon: Kirkland HunArthur Stringer, MD;  Location: [facility];  Service: Gynecology;  Laterality: N/A;  . CERVICAL CONIZATION W/BX N/A 09/25/2018   Procedure: CONIZATION CERVIX WITH BIOPSY;  Surgeon: Adolphus Birchwoodossi, Emma, MD;  [facility];  Service: Gynecology;  Laterality: N/A;  . CERVICAL CRYOTHERAPY  1984  . COLPOSCOPY  08/26/13  . DILATION AND CURETTAGE, DIAGNOSTIC / THERAPEUTIC  1994   Blighted Ovum  . HYSTEROSCOPY     uterine polypectomy Jan 7th  . HYSTEROSCOPY WITH RESECTOSCOPE  08/04/2009   Removed polyp & IUD  . LYMPH NODE BIOPSY    . TONSILLECTOMY      There were no vitals filed for this visit.  Subjective Assessment - 03/18/19 0937    Subjective  Pain is minimal.    Currently in Pain?  Yes     Pain Score  1     Pain Location  Buttocks    Pain Orientation  Left    Pain Descriptors / Indicators  Aching    Pain Type  Chronic pain    Pain Onset  More than a month ago    Pain Frequency  Intermittent    Aggravating Factors   in the morning, lifting/carrying, getting in/out of the car    Pain Relieving Factors  ice, Gabapentin, rest                       [facility] Adult PT Treatment/Exercise - 03/18/19 0001      Lumbar Exercises: Aerobic   Nustep  Level 4 x 10'   PT present to discuss progress     Lumbar Exercises: Machines for Strengthening   Other Lumbar Machine Exercise  shoulder flexion and extension x 20 reps each with 15#, standing with postural cueing and core stabilizer activation with UE overlay   standing on black pad     Knee/Hip Exercises: Standing   Forward Step Up  Left;2 sets;10 reps    Forward Step Up Limitations  on Bosu    Walking with Sports Cord  25# forward and reverse with abdominal  bracing x10 each               PT Short Term Goals - 01/28/19 1546      PT SHORT TERM GOAL #3   Title  report < or = to 5/10 Lt LE pain with standing and walking    Status  Achieved        PT Long Term Goals - 03/18/19 0940      PT LONG TERM GOAL #5   Title  report a 90% reduction in Lt LE pain with 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  Pt is able to stabilize her core and align properly with proprioceptive strength exercises.  Pt reports fatigue of the Lt hip  with endurance tasks.  Pt provided intermittent verbal cues for alignment with higher level tasks.  Pt will continue to benefit from skilled PT for strength progression    PT Frequency  1x / week    PT Duration  6 weeks    PT Treatment/Interventions  ADLs/Self Care Home Management;Electrical Stimulation;Cryotherapy;Moist Heat;Stair training;Gait training;Therapeutic activities;Therapeutic exercise;Neuromuscular re-education;Patient/family education;Manual techniques;Passive range of motion;Dry needling;Taping    PT Next Visit Plan  Continue to build strength needed for work and gardening    PT Home Exercise Plan  Access Code: [email or web address]     Consulted and Agree with Plan of Care  Patient       Patient will benefit from skilled therapeutic intervention in order to improve the following deficits and impairments:  Abnormal gait, Decreased activity tolerance, Decreased endurance, Decreased mobility, Difficulty walking, Increased muscle spasms, Pain, Obesity  Visit Diagnosis: 1. Pain in left hip   2. Other abnormalities of gait and mobility   3. Muscle weakness (generalized)        Problem List Patient Active Problem List   Diagnosis Date Noted  . Greater trochanteric bursitis 11/28/2018  . Atypical squamous cells cannot exclude high grade squamous intraepithelial lesion on cytologic smear of cervix (ASC-H) 09/12/2018  . ASCUS with positive high risk HPV cervical 09/12/2018  . Insomnia 06/17/2018  . 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 Abnormal cervical Papanicolaou smear 04/03/2017  . Atrophic vaginitis 04/03/2017  . Cervical intraepithelial neoplasia grade 1 04/03/2017  . Unspecified dyspareunia (CODE) 04/03/2017  . Genital herpes simplex 04/03/2017  . 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 Menopausal symptom 04/03/2017  . Skin lesion 04/03/2017  . Hyperlipidemia 10/29/2015  . Serum calcium elevated 10/26/2014  . Recurrent UTI 02/05/2014  .  Upper airway cough syndrome 10/07/2013  . Uncomplicated asthma 63/87/5643  . Gastro-esophageal reflux disease without esophagitis 10/07/2013  . Loose stools 10/07/2013  . Thumb pain 11/21/2012  . History of depression 11/21/2012  . Medication management 11/21/2012  . Lithium use 09/03/2012  .  Rosacea 06/02/2012  . Medication monitoring encounter 06/02/2012  . Depression 06/02/2012  . Perimenopausal 06/02/2012  . Major depressive disorder, single episode 06/02/2012  . Cardiovascular risk factor 08/28/2011  . FH: premature coronary heart disease 08/09/2011  . ESOPHAGEAL REFLUX 08/02/2010  . PLANTAR FASCIITIS, RIGHT 08/02/2010  . OTHER DISEASES OF NASAL CAVITY AND SINUSES 04/20/2010  . HYPERLIPIDEMIA 06/15/2009  . TENDINITIS, RIGHT KNEE 08/11/2008  . Allergic rhinitis 05/14/2007  . Hypothyroidism 03/03/2007  . ECZEMA, HANDS 03/03/2007  . POSTURAL LIGHTHEADEDNESS 03/03/2007  . DEPRESSION 02/27/2007  . Cough variant asthma  vs  Eos bronchitis 02/27/2007    Lorrene ReidKelly Dayla Gasca, PT 03/18/19 10:19 AM   [facility] [address], [address] [place], [place], [address] Phone: [phone]   Fax:  [phone]  Name: Lisa Lee MRN: [id number] Date of Birth: 10/09/1960

## 2019-03-21 ENCOUNTER — Other Ambulatory Visit: Payer: Self-pay | Admitting: Cardiology

## 2019-03-25 ENCOUNTER — Other Ambulatory Visit: Payer: Self-pay

## 2019-03-25 ENCOUNTER — Ambulatory Visit: Payer: 59

## 2019-03-25 DIAGNOSIS — M25552 Pain in left hip: Secondary | ICD-10-CM

## 2019-03-25 DIAGNOSIS — R2689 Other abnormalities of gait and mobility: Secondary | ICD-10-CM

## 2019-03-25 DIAGNOSIS — R252 Cramp and spasm: Secondary | ICD-10-CM

## 2019-03-25 DIAGNOSIS — M6281 Muscle weakness (generalized): Secondary | ICD-10-CM

## 2019-03-25 NOTE — Therapy (Signed)
[facility] [address], [place], [place], [address] Phone: [phone]   Fax:  [phone]  Physical Therapy Treatment  Patient Details  Name: Lisa Lee MRN: [id number] Date of Birth: 1961/07/09 Referring Provider (PT): Clearance Coots, MD   Encounter Date: 03/25/2019  PT End of Session - 03/25/19 1010    Visit Number  17    PT Start Time  9450    PT Stop Time  1016    PT Time Calculation (min)  40 min    Activity Tolerance  Patient tolerated treatment well    Behavior During Therapy  [facility] for tasks assessed/performed       Past Medical History:  Diagnosis Date  . Acid reflux   . Allergic rhinitis   . Asthma   . Depression   . Diarrhea 08/09/2011   Poss ibs  Vs other  Related to stress  consdier other eval if needed. Had colonoscoopy per dr Cristina Gong   . Hemorrhoids   . High cholesterol   . HPV in female 48  . Hypothyroidism   . Medication side effect 11/01/2011   tussionex   distrubed sleep   . Thyroid disease     Past Surgical History:  Procedure Laterality Date  . CERVICAL CONIZATION W/BX N/A 07/21/2015   Procedure: CONIZATION CERVIX WITH BIOPSY;  Surgeon: Ena Dawley, MD;  Location: [facility];  Service: Gynecology;  Laterality: N/A;  . CERVICAL CONIZATION W/BX N/A 09/25/2018   Procedure: CONIZATION CERVIX WITH BIOPSY;  Surgeon: Everitt Amber, MD;  Location[facility];  Service: Gynecology;  Laterality: N/A;  . CERVICAL CRYOTHERAPY  1984  . COLPOSCOPY  08/26/13  . DILATION AND CURETTAGE, DIAGNOSTIC / THERAPEUTIC  1994   Blighted Ovum  . HYSTEROSCOPY     uterine polypectomy Jan 7th  . HYSTEROSCOPY WITH RESECTOSCOPE  08/04/2009   Removed polyp & IUD  . LYMPH NODE BIOPSY    . TONSILLECTOMY      There were no vitals filed for this visit.  Subjective Assessment - 03/25/19 0938    Subjective  No longer having nerve pain.  Ready for D/C.  I am 98% improved.    Currently in  Pain?  No/denies    Pain Score  --   1/10 max in the past week        [facility] PT Assessment - 03/25/19 0001      Assessment   Medical Diagnosis  Greater trochanteric bursitis of Lt hip    Referring Provider (PT)  Clearance Coots, MD    Onset Date/Surgical Date  11/07/18      Observation/Other Assessments   Focus on Therapeutic Outcomes (FOTO)   9% limitation      Strength   Overall Strength  Within functional limits for tasks performed                   [facility] Adult PT Treatment/Exercise - 03/25/19 0001      Lumbar Exercises: Aerobic   Nustep  Level 4 x 10'   PT present to discuss progress     Lumbar Exercises: Machines for Strengthening   Other Lumbar Machine Exercise  shoulder flexion and extension x 20 reps each with 15#, standing with postural cueing and core stabilizer activation with UE overlay   standing on black pad     Knee/Hip Exercises: Standing   Forward Step Up  Left;2 sets;10 reps    Forward Step Up Limitations  on Bosu  Walking with Sports Cord  25# forward and reverse with abdominal bracing x10 each               PT Short Term Goals - 01/28/19 1546      PT SHORT TERM GOAL #3   Title  report < or = to 5/10 Lt LE pain with standing and walking    Status  Achieved        PT Long Term Goals - 03/25/19 0940      PT LONG TERM GOAL #1   Title  be independent in advanced HEP    Status  Achieved      PT LONG TERM GOAL #2   Title  demonstrate symmetry with ambulation on level surface    Status  Achieved      PT LONG TERM GOAL #4   Title  lift and carry laundry without increased Lt LE pain    Status  Achieved      PT LONG TERM GOAL #5   Title  report a 90% reduction in Lt LE pain with 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  Pt reports 1/10 max Lt hip pain with care of child at work.  Pt is able to perform gardening without difficulty or increased pain.  Pt with improved postural awareness and is able to activate her core with neutral alignment.  FOTO is improved to 9% limitation.  Pt will D/C to HEP today for further strength and endurance gains.    PT Next Visit Plan  D/C to HEP    PT Home Exercise Plan  Access Code: [email or web address]     Consulted and Agree with Plan of Care  Patient       Patient will benefit from skilled therapeutic intervention in order to improve the following deficits and impairments:     Visit Diagnosis: Pain in left hip  Other abnormalities of gait and mobility  Muscle weakness (generalized)  Cramp and spasm     Problem List Patient Active Problem List   Diagnosis Date Noted  . Greater trochanteric bursitis 11/28/2018  . Atypical squamous cells cannot exclude high grade squamous intraepithelial lesion on cytologic smear of cervix (ASC-H) 09/12/2018  . ASCUS with positive high risk HPV cervical 09/12/2018  . Insomnia 06/17/2018  . 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 Abnormal cervical Papanicolaou smear 04/03/2017  . Atrophic vaginitis 04/03/2017  . Cervical intraepithelial neoplasia grade 1 04/03/2017  . Unspecified dyspareunia (CODE) 04/03/2017  . Genital herpes simplex 04/03/2017  . 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 Menopausal symptom 04/03/2017  . Skin lesion 04/03/2017  . Hyperlipidemia 10/29/2015  . Serum calcium elevated 10/26/2014  . Recurrent UTI 02/05/2014  . Upper airway cough syndrome 10/07/2013  . Uncomplicated asthma 76/28/3151  . Gastro-esophageal reflux disease without esophagitis 10/07/2013  . Loose stools 10/07/2013  . Thumb pain 11/21/2012  . History of depression 11/21/2012  . Medication management 11/21/2012   . Lithium use 09/03/2012  . Rosacea 06/02/2012  . Medication monitoring encounter 06/02/2012  .  Depression 06/02/2012  . Perimenopausal 06/02/2012  . Major depressive disorder, single episode 06/02/2012  . Cardiovascular risk factor 08/28/2011  . FH: premature coronary heart disease 08/09/2011  . ESOPHAGEAL REFLUX 08/02/2010  . PLANTAR FASCIITIS, RIGHT 08/02/2010  . OTHER DISEASES OF NASAL CAVITY AND SINUSES 04/20/2010  . HYPERLIPIDEMIA 06/15/2009  . TENDINITIS, RIGHT KNEE 08/11/2008  . Allergic rhinitis 05/14/2007  . Hypothyroidism 03/03/2007  . ECZEMA, HANDS 03/03/2007  . POSTURAL LIGHTHEADEDNESS 03/03/2007  . DEPRESSION 02/27/2007  . Cough variant asthma  vs  Eos bronchitis 02/27/2007   PHYSICAL THERAPY DISCHARGE SUMMARY  Visits from Start of Care: 17  Current functional level related to goals / functional outcomes: Pt reports 98% overall improvement since the start of care.     Remaining deficits: Pt denies any functional deficits at this time.     Education / Equipment: HEP, posture/mechanics, activation of core Plan: Patient agrees to discharge.  Patient goals were met. Patient is being discharged due to meeting the stated rehab goals.  ?????        Sigurd Sos, PT 03/25/19 10:15 AM  [facility] [address], [place], [place], [address] Phone: [phone]   Fax:  [phone]  Name: LAWAN NANEZ MRN: [id number] Date of Birth: 21-Mar-1961

## 2019-04-16 ENCOUNTER — Encounter: Payer: Self-pay | Admitting: Cardiology

## 2019-04-16 ENCOUNTER — Encounter: Payer: Self-pay | Admitting: Internal Medicine

## 2019-04-16 DIAGNOSIS — E785 Hyperlipidemia, unspecified: Secondary | ICD-10-CM

## 2019-04-16 MED ORDER — FENOFIBRATE 160 MG PO TABS: ORAL_TABLET | ORAL | 0 refills | Status: DC

## 2019-04-16 MED ORDER — YUVAFEM 10 MCG VA TABS: 1.0000 | ORAL_TABLET | VAGINAL | 1 refills | Status: DC

## 2019-04-16 MED ORDER — LEVOTHYROXINE SODIUM 75 MCG PO TABS: 75.0000 ug | ORAL_TABLET | Freq: Every day | ORAL | 1 refills | Status: DC

## 2019-04-16 MED ORDER — VALACYCLOVIR HCL 500 MG PO TABS: ORAL_TABLET | ORAL | 1 refills | Status: DC

## 2019-04-16 MED ORDER — ATORVASTATIN CALCIUM 10 MG PO TABS: 10.0000 mg | ORAL_TABLET | Freq: Every day | ORAL | 0 refills | Status: DC

## 2019-04-16 MED ORDER — PANTOPRAZOLE SODIUM 40 MG PO TBEC: 40.0000 mg | DELAYED_RELEASE_TABLET | Freq: Every day | ORAL | 1 refills | Status: DC

## 2019-04-16 MED ORDER — FAMOTIDINE 20 MG PO TABS: 20.0000 mg | ORAL_TABLET | Freq: Every day | ORAL | 1 refills | Status: DC

## 2019-04-16 NOTE — Telephone Encounter (Signed)
Pt's medications were sent to pt's pharmacy as requested. Confirmation received.  

## 2019-04-16 NOTE — Telephone Encounter (Signed)
Madison please refill  meds  She has requested    Through January 2021 when then due for cpx or med evaluation  ( can get her a cpx appt for January 21 )

## 2019-04-17 ENCOUNTER — Telehealth: Payer: Self-pay | Admitting: Physician Assistant

## 2019-04-17 ENCOUNTER — Other Ambulatory Visit: Payer: Self-pay

## 2019-04-17 MED ORDER — LITHIUM CARBONATE ER 300 MG PO TBCR: 300.0000 mg | EXTENDED_RELEASE_TABLET | Freq: Two times a day (BID) | ORAL | 1 refills | Status: DC

## 2019-04-17 MED ORDER — ESCITALOPRAM OXALATE 20 MG PO TABS: 20.0000 mg | ORAL_TABLET | Freq: Every day | ORAL | 1 refills | Status: DC

## 2019-04-17 NOTE — Telephone Encounter (Signed)
Refills submitted for 90 day supply to [facility] Last visit 02/27/2019

## 2019-04-17 NOTE — Telephone Encounter (Signed)
Pt left v-mail. Her insurance has changed to [place]. [place]. Ask that refill for Lexapro 200 mg 1/d and Lithium 300mg  2/d be sent to [facility].

## 2019-04-17 NOTE — Telephone Encounter (Signed)
Change in pharmacy, send all Rx refills to [facility]

## 2019-04-22 ENCOUNTER — Encounter: Payer: Self-pay | Admitting: Internal Medicine

## 2019-04-22 ENCOUNTER — Other Ambulatory Visit: Payer: Self-pay

## 2019-04-22 ENCOUNTER — Telehealth (INDEPENDENT_AMBULATORY_CARE_PROVIDER_SITE_OTHER): Payer: 59 | Admitting: Internal Medicine

## 2019-04-22 DIAGNOSIS — R198 Other specified symptoms and signs involving the digestive system and abdomen: Secondary | ICD-10-CM | POA: Diagnosis not present

## 2019-04-22 DIAGNOSIS — K219 Gastro-esophageal reflux disease without esophagitis: Secondary | ICD-10-CM

## 2019-04-22 DIAGNOSIS — K2900 Acute gastritis without bleeding: Secondary | ICD-10-CM | POA: Diagnosis not present

## 2019-04-22 NOTE — Progress Notes (Signed)
Virtual Visit via Video Note  I connected with@ on 04/22/19 at 11:15 AM EDT by a video enabled telemedicine application and verified that I am speaking with the correct person using two identifiers. Location patient: home Location provider:work  office Persons participating in the virtual visit: patient, provider  [place]th national recommendations  regarding COVID 19 pandemic   video visit is advised over in office visit for this patient.  Patient aware  of the limitations of evaluation and management by telemedicine and  availability of in person appointments. and agreed to proceed.   HPI: Lisa Lee presents for video visit has had  Gi sx and would like opinion  about risk of infection and or covid 19 She had epigastric and heart burn ( opn protonix  ) and had episode of vomiting 2 days ago and now is a lot better today . 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  She has no diarrhea fever body aches chills  or HA .   Family not ill and no known exposures .    ROS: See pertinent positives and negatives per HPI.  Past Medical History:  Diagnosis Date  . Acid reflux   . Allergic rhinitis   . Asthma   . Depression   . Diarrhea 08/09/2011   Poss ibs  Vs other  Related to stress  consdier other eval if needed. Had colonoscoopy per dr Matthias Hughs   . Hemorrhoids   . High cholesterol   . HPV in female 70  . Hypothyroidism   . Medication side effect 11/01/2011   tussionex   distrubed sleep   . Thyroid disease     Past Surgical History:  Procedure Laterality Date  . CERVICAL CONIZATION W/BX N/A 07/21/2015   Procedure: CONIZATION CERVIX WITH BIOPSY;  Surgeon: Kirkland Hun, MD;  Location: [facility];  Service: Gynecology;  Laterality: N/A;  . CERVICAL CONIZATION W/BX N/A 09/25/2018   Procedure: CONIZATION CERVIX WITH BIOPSY;  Surgeon: Adolphus Birchwood, MD;  Location: [facility];  Service:  Gynecology;  Laterality: N/A;  . CERVICAL CRYOTHERAPY  1984  . COLPOSCOPY  08/26/13  . DILATION AND CURETTAGE, DIAGNOSTIC / THERAPEUTIC  1994   Blighted Ovum  . HYSTEROSCOPY     uterine polypectomy Jan 7th  . HYSTEROSCOPY WITH RESECTOSCOPE  08/04/2009   Removed polyp & IUD  . LYMPH NODE BIOPSY    . TONSILLECTOMY      Family History  Problem Relation Age of Onset  . Heart disease Sister 4       cabg stent  . Arthritis Mother   . Heart disease Mother   . Heart disease Father   . Colon cancer Father   . Parkinson's disease Father   . Pulmonary embolism Daughter   . Heart disease Sister   . Thyroid disease Sister   . Heart disease Brother   . Thyroid disease Daughter   . Rectal cancer Neg Hx     Social History   Tobacco Use  . Smoking status: Never Smoker  . Smokeless tobacco: Never Used  Substance Use Topics  . Alcohol use: Yes    Alcohol/week: 0.0 standard drinks    Comment: rare  . Drug use: No      Current Outpatient Medications:  .  albuterol (PROAIR HFA) 108 (90 Base) MCG/ACT inhaler, Inhale 2 puffs into the lungs every 6 (six) hours as  needed for wheezing., Disp: 3 Inhaler, Rfl: 0 .  aspirin EC 81 MG tablet, Take 1 tablet (81 mg total) by mouth daily., Disp: , Rfl:  .  atorvastatin (LIPITOR) 10 MG tablet, Take 1 tablet (10 mg total) by mouth daily. Please keep upcoming appt in November with Dr. Meda Coffee before anymore refills. Thank you, Disp: 90 tablet, Rfl: 0 .  Cholecalciferol (VITAMIN D) 2000 units tablet, Take 2,000 Units by mouth daily., Disp: , Rfl:  .  dextromethorphan-guaiFENesin (MUCINEX DM) 30-600 MG 12hr tablet, Take 1 tablet by mouth 2 (two) times daily as needed for cough., Disp: , Rfl:  .  doxylamine, Sleep, (UNISOM) 25 MG tablet, Take 25 mg by mouth at bedtime as needed., Disp: , Rfl:  .  escitalopram (LEXAPRO) 20 MG tablet, Take 1 tablet (20 mg total) by mouth daily., Disp: 90 tablet, Rfl: 1 .  famotidine (PEPCID) 20 MG tablet, Take 1 tablet (20 mg  total) by mouth at bedtime., Disp: 90 tablet, Rfl: 1 .  fenofibrate 160 MG tablet, TAKE 1 TABLET BY MOUTH EVERY DAY. Please keep upcoming appt with Dr. Meda Coffee in November before anymore refills. Thank you, Disp: 90 tablet, Rfl: 0 .  gabapentin (NEURONTIN) 100 MG capsule, Take 1 capsule (100 mg total) by mouth 3 (three) times daily. (Patient taking differently: Take 100 mg by mouth at bedtime. ), Disp: 90 capsule, Rfl: 3 .  ibuprofen (ADVIL,MOTRIN) 800 MG tablet, Take 1 tablet (800 mg total) by mouth every 8 (eight) hours as needed for moderate pain. For AFTER surgery, Disp: 30 tablet, Rfl: 1 .  Ibuprofen-Famotidine 800-26.6 MG TABS, Take 1 tablet by mouth 3 (three) times daily., Disp: 90 tablet, Rfl: 3 .  levothyroxine (SYNTHROID) 75 MCG tablet, Take 1 tablet (75 mcg total) by mouth daily., Disp: 90 tablet, Rfl: 1 .  lithium carbonate (LITHOBID) 300 MG CR tablet, Take 1 tablet (300 mg total) by mouth 2 (two) times daily., Disp: 180 tablet, Rfl: 1 .  loperamide (IMODIUM A-D) 2 MG tablet, Take 1 mg by mouth daily. , Disp: , Rfl:  .  modafinil (PROVIGIL) 200 MG tablet, Take 0.5 tablets (100 mg total) by mouth daily., Disp: 30 tablet, Rfl: 5 .  Multiple Vitamins-Minerals (MULTIVITAMIN WITH MINERALS) tablet, Take 1 tablet by mouth daily., Disp: , Rfl:  .  pantoprazole (PROTONIX) 40 MG tablet, Take 1 tablet (40 mg total) by mouth daily., Disp: 90 tablet, Rfl: 1 .  valACYclovir (VALTREX) 500 MG tablet, valacyclovir 500 mg tablet  For outbreak: Take one tablet by oral route twice a day for 3 days. For suppression: Take one tablet by oral route daily., Disp: 30 tablet, Rfl: 1 .  Wheat Dextrin (BENEFIBER) POWD, Take 2 heaping teaspoons by mouth daily, Disp: , Rfl:  .  YUVAFEM 10 MCG TABS vaginal tablet, Place 1 tablet (10 mcg total) vaginally 2 (two) times a week., Disp: 8 tablet, Rfl: 1  EXAM: BP Readings from Last 3 Encounters:  09/25/18 116/63  12/01/14 94/70  10/26/14 122/76    VITALS per patient if  applicable: GENERAL: alert, oriented, appears well and in no acute distress HEENT: atraumatic, conjunttiva clear, no obvious abnormalities on inspection of external nose and ears NECK: normal movements of the head and neck LUNGS: on inspection no signs of respiratory distress, breathing rate appears normal, no obvious gross SOB, gasping or wheezing CV: no obvious cyanosis MS: moves all visible extremities without noticeable abnormality PSYCH/NEURO: pleasant and cooperative, no obvious depression or anxiety, speech and thought  processing grossly intact Lab Results  Component Value Date   WBC 8.2 08/01/2018   HGB 13.5 08/01/2018   HCT 40.0 08/01/2018   PLT 333.0 08/01/2018   GLUCOSE 132 (H) 08/01/2018   CHOL 176 08/01/2018   TRIG 135.0 08/01/2018   HDL 56.40 08/01/2018   LDLDIRECT 101.0 10/30/2016   LDLCALC 93 08/01/2018   ALT 31 08/01/2018   AST 25 08/01/2018   NA 140 08/01/2018   K 4.7 08/01/2018   CL 107 08/01/2018   CREATININE 1.00 08/01/2018   BUN 26 (H) 08/01/2018   CO2 25 08/01/2018   TSH 1.45 08/01/2018   HGBA1C 5.4 02/13/2019    ASSESSMENT AND PLAN:  Discussed the following assessment and plan:    ICD-10-CM   1. GI symptoms  R19.8   2. Acute gastritis without hemorrhage, unspecified gastritis type  K29.00   3. Gastroesophageal reflux disease, esophagitis presence not specified  K21.9    Gi illness resolving and no new intervention needed  Underlying GERD also.   possibly contagious but if recurring consider GB disease.  Disc covid10 testing but seriously doubt to be helpful   But she plans on avoiding the CHD  Infant pre operative  For now  With abundance of caution.   Counseled.   Expectant management and discussion of plan and treatment with opportunity to ask questions and all were answered. The patient agreed with the plan and demonstrated an understanding of the instructions.   Advised to call back or seek an in-person evaluation if worsening  or having   further concerns  In interim .  Berniece Andreas, MD

## 2019-05-28 ENCOUNTER — Ambulatory Visit: Payer: 59

## 2019-06-03 ENCOUNTER — Ambulatory Visit (INDEPENDENT_AMBULATORY_CARE_PROVIDER_SITE_OTHER): Payer: 59 | Admitting: Cardiology

## 2019-06-03 ENCOUNTER — Other Ambulatory Visit: Payer: Self-pay

## 2019-06-03 ENCOUNTER — Encounter: Payer: Self-pay | Admitting: Cardiology

## 2019-06-03 VITALS — BP 110/72 | HR 75 | Ht 61.5 in | Wt 139.0 lb

## 2019-06-03 DIAGNOSIS — Z8659 Personal history of other mental and behavioral disorders: Secondary | ICD-10-CM

## 2019-06-03 DIAGNOSIS — Z8249 Family history of ischemic heart disease and other diseases of the circulatory system: Secondary | ICD-10-CM

## 2019-06-03 DIAGNOSIS — E785 Hyperlipidemia, unspecified: Secondary | ICD-10-CM

## 2019-06-03 LAB — COMPREHENSIVE METABOLIC PANEL
ALT: 30 IU/L (ref 0–32)
AST: 25 IU/L (ref 0–40)
Albumin/Globulin Ratio: 2.5 — ABNORMAL HIGH (ref 1.2–2.2)
Albumin: 4.9 g/dL (ref 3.8–4.9)
Alkaline Phosphatase: 66 IU/L (ref 39–117)
BUN/Creatinine Ratio: 27 — ABNORMAL HIGH (ref 9–23)
BUN: 28 mg/dL — ABNORMAL HIGH (ref 6–24)
Bilirubin Total: 0.3 mg/dL (ref 0.0–1.2)
CO2: 22 mmol/L (ref 20–29)
Calcium: 10.4 mg/dL — ABNORMAL HIGH (ref 8.7–10.2)
Chloride: 105 mmol/L (ref 96–106)
Creatinine, Ser: 1.04 mg/dL — ABNORMAL HIGH (ref 0.57–1.00)
GFR calc Af Amer: 68 mL/min/{1.73_m2} (ref >59)
GFR calc non Af Amer: 59 mL/min/{1.73_m2} — ABNORMAL LOW (ref >59)
Globulin, Total: 2 g/dL (ref 1.5–4.5)
Glucose: 98 mg/dL (ref 65–99)
Potassium: 4.6 mmol/L (ref 3.5–5.2)
Sodium: 139 mmol/L (ref 134–144)
Total Protein: 6.9 g/dL (ref 6.0–8.5)

## 2019-06-03 LAB — LIPID PANEL
Chol/HDL Ratio: 3.2 ratio (ref 0.0–4.4)
Cholesterol, Total: 167 mg/dL (ref 100–199)
HDL: 53 mg/dL (ref >39)
LDL Chol Calc (NIH): 99 mg/dL (ref 0–99)
Triglycerides: 77 mg/dL (ref 0–149)
VLDL Cholesterol Cal: 15 mg/dL (ref 5–40)

## 2019-06-03 LAB — CBC
Hematocrit: 37.8 % (ref 34.0–46.6)
Hemoglobin: 13.2 g/dL (ref 11.1–15.9)
MCH: 31.8 pg (ref 26.6–33.0)
MCHC: 34.9 g/dL (ref 31.5–35.7)
MCV: 91 fL (ref 79–97)
Platelets: 368 10*3/uL (ref 150–450)
RBC: 4.15 x10E6/uL (ref 3.77–5.28)
RDW: 11.8 % (ref 11.7–15.4)
WBC: 9.6 10*3/uL (ref 3.4–10.8)

## 2019-06-03 LAB — LITHIUM LEVEL: Lithium Lvl: 0.8 mmol/L (ref 0.6–1.2)

## 2019-06-03 LAB — TSH: TSH: 0.039 u[IU]/mL — ABNORMAL LOW (ref 0.450–4.500)

## 2019-06-03 MED ORDER — FENOFIBRATE 160 MG PO TABS: ORAL_TABLET | ORAL | 3 refills | Status: DC

## 2019-06-03 MED ORDER — ATORVASTATIN CALCIUM 10 MG PO TABS: 10.0000 mg | ORAL_TABLET | Freq: Every day | ORAL | 3 refills | Status: DC

## 2019-06-03 NOTE — Addendum Note (Signed)
Addended by: Nuala Alpha on: 06/03/2019 09:03 AM   Modules accepted: Orders

## 2019-06-03 NOTE — Progress Notes (Signed)
Cardiology Office Note    Date:  06/03/2019   ID:  JASE HIMMELBERGER, DOB 12-05-1960, MRN [id number]  PCP:  Burnis Medin, MD  Cardiologist:  Ena Dawley, MD   Chief complain: 4 months follow up  History of Present Illness:  Lisa Lee is a 58 y.o. female with hyperlipidemia and significant FH of premature CAD. She is very active and asymptomatic. She is on carb free diet and recently lost 15 lbs. Denies any SOB, CP, no LE edema, no orthopnea, palpitations or syncope. She underwent a calcium scoring in 2013 that was 0.   04/03/2017 - 4 months follow-up, she feels tired and she is working 5 days a week for 10 hours. She was started rosuvastatin, that she didn't tolerate, then atorvastatin, pravastatin, most recently switched to Livalo 1 mg po daily. However she didn't start using again, she feels that her shoulder pain and hip pain is related to her work rather than medicine. She will stop using it for a week and see if that helps. She has also noticed some mild memory impairment. No significant shortness of breath or chest pain. No lower extremity edema.  01/31/2018 -this is 1 year follow-up, she has been compliant with her medications, denies any chest pain shortness of breath, no lower extremity edema, orthopnea, paroxysmal nocturnal dyspnea, no palpitation, no claudications.  She has no side effects from her medications.  She had cholesterol checked in April of this year when her LDL was 111 but since then she changed her diet, she has been counting calories and lost 8 pounds since then.  She is currently 130 pounds with goal under 120 pounds.  06/03/2019 -1 year follow-up, the patient is feeling great, she has no symptoms of wounds to her, she has been compliant with her medications and has no side effects.  She currently does not have time to exercise.  Past Medical History:  Diagnosis Date  . Acid reflux   . Allergic rhinitis   . Asthma   . Depression   . Diarrhea  08/09/2011   Poss ibs  Vs other  Related to stress  consdier other eval if needed. Had colonoscoopy per dr Cristina Gong   . Hemorrhoids   . High cholesterol   . 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 Hypothyroidism   . Medication side effect 11/01/2011   tussionex   distrubed sleep   . Thyroid disease    Past Surgical History:  Procedure Laterality Date  . CERVICAL CONIZATION W/BX N/A 07/21/2015   Procedure: CONIZATION CERVIX WITH BIOPSY;  Surgeon: Ena Dawley, MD;  Location: [facility];  Service: Gynecology;  Laterality: N/A;  . CERVICAL CONIZATION W/BX N/A 09/25/2018   Procedure: CONIZATION CERVIX WITH BIOPSY;  Surgeon: Everitt Amber, MD;  Location[facility];  Service: Gynecology;  Laterality: N/A;  . CERVICAL CRYOTHERAPY  1984  . COLPOSCOPY  08/26/13  . DILATION AND CURETTAGE, DIAGNOSTIC / THERAPEUTIC  1994   Blighted Ovum  . HYSTEROSCOPY     uterine polypectomy Jan 7th  . HYSTEROSCOPY WITH RESECTOSCOPE  08/04/2009   Removed polyp & IUD  . LYMPH NODE BIOPSY    . TONSILLECTOMY      Current Medications: Outpatient Medications Prior to Visit  Medication Sig Dispense Refill  . albuterol (PROAIR HFA) 108 (90 Base) MCG/ACT inhaler Inhale 2 puffs into the lungs every 6 (six) hours as needed for wheezing. 3 Inhaler 0  . aspirin EC 81 MG tablet Take 1 tablet (81 mg  total) by mouth daily.    Marland Kitchen atorvastatin (LIPITOR) 10 MG tablet Take 1 tablet (10 mg total) by mouth daily. Please keep upcoming appt in November with Dr. Delton See before anymore refills. Thank you 90 tablet 0  . Cholecalciferol (VITAMIN D) 2000 units tablet Take 2,000 Units by mouth daily.    Marland Kitchen dextromethorphan-guaiFENesin (MUCINEX DM) 30-600 MG 12hr tablet Take 1 tablet by mouth 2 (two) times daily as needed for cough.    . doxylamine, Sleep, (UNISOM) 25 MG tablet Take 25 mg by mouth at bedtime as needed.    Marland Kitchen escitalopram (LEXAPRO) 20 MG tablet Take 1 tablet (20 mg total) by mouth daily. 90 tablet 1  . famotidine (PEPCID) 20 MG tablet  Take 1 tablet (20 mg total) by mouth at bedtime. 90 tablet 1  . fenofibrate 160 MG tablet TAKE 1 TABLET BY MOUTH EVERY DAY. Please keep upcoming appt with Dr. Delton See in November before anymore refills. Thank you 90 tablet 0  . levothyroxine (SYNTHROID) 75 MCG tablet Take 1 tablet (75 mcg total) by mouth daily. 90 tablet 1  . lithium carbonate (LITHOBID) 300 MG CR tablet Take 1 tablet (300 mg total) by mouth 2 (two) times daily. 180 tablet 1  . loperamide (IMODIUM A-D) 2 MG tablet Take 1 mg by mouth daily.     . modafinil (PROVIGIL) 200 MG tablet Take 0.5 tablets (100 mg total) by mouth daily. 30 tablet 5  . Multiple Vitamins-Minerals (MULTIVITAMIN WITH MINERALS) tablet Take 1 tablet by mouth daily.    . pantoprazole (PROTONIX) 40 MG tablet Take 1 tablet (40 mg total) by mouth daily. 90 tablet 1  . valACYclovir (VALTREX) 500 MG tablet valacyclovir 500 mg tablet  For outbreak: Take one tablet by oral route twice a day for 3 days. For suppression: Take one tablet by oral route daily. 30 tablet 1  . YUVAFEM 10 MCG TABS vaginal tablet Place 1 tablet (10 mcg total) vaginally 2 (two) times a week. 8 tablet 1  . gabapentin (NEURONTIN) 100 MG capsule Take 1 capsule (100 mg total) by mouth 3 (three) times daily. (Patient not taking: Reported on 06/03/2019) 90 capsule 3  . ibuprofen (ADVIL,MOTRIN) 800 MG tablet Take 1 tablet (800 mg total) by mouth every 8 (eight) hours as needed for moderate pain. For AFTER surgery 30 tablet 1  . Ibuprofen-Famotidine 800-26.6 MG TABS Take 1 tablet by mouth 3 (three) times daily. 90 tablet 3  . Wheat Dextrin (BENEFIBER) POWD Take 2 heaping teaspoons by mouth daily     No facility-administered medications prior to visit.      Allergies:   Penicillins, Atorvastatin, Pravastatin, and Rosuvastatin   Social History   Socioeconomic History  . Marital status: Married    Spouse name: Not on file  . Number of children: Not on file  . Years of education: Not on file  . Highest  education level: Not on file  Occupational History  . Not on file  Social Needs  . Financial resource strain: Not on file  . Food insecurity    Worry: Not on file    Inability: Not on file  . Transportation needs    Medical: Not on file    Non-medical: Not on file  Tobacco Use  . Smoking status: Never Smoker  . Smokeless tobacco: Never Used  Substance and Sexual Activity  . Alcohol use: Yes    Alcohol/week: 0.0 standard drinks    Comment: rare  . Drug use: No  .  Sexual activity: Yes    Birth control/protection: None  Lifestyle  . Physical activity    Days per week: Not on file    Minutes per session: Not on file  . Stress: Not on file  Relationships  . Social Musician on phone: Not on file    Gets together: Not on file    Attends religious service: Not on file    Active member of club or organization: Not on file    Attends meetings of clubs or organizations: Not on file    Relationship status: Not on file  Other Topics Concern  . Not on file  Social History Narrative    [employer]. 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 Father had MI at age 67.  Sister stents at 36, CABG x 4 at 10. Brother at 83 CABG x 3.   ROS:   Please see the history of present illness.    ROS All other systems reviewed and are negative.  PHYSICAL EXAM:   VS:  BP 110/72   Pulse 75   Ht 5' 1.5" (1.562 m)   Wt 139 lb (63 kg)   LMP 12/09/2013   SpO2 98%   BMI 25.84 kg/m    GEN: Well nourished,  well developed, in no acute distress  HEENT: normal  Neck: no JVD, carotid bruits, or masses Cardiac: RRR; no murmurs, rubs, or gallops,no edema  Respiratory:  clear to auscultation bilaterally, normal work of breathing GI: soft, nontender, nondistended, + BS MS: no deformity or atrophy  Skin: warm and dry, no rash Neuro:  Alert and Oriented x 3, Strength and sensation are intact Psych: euthymic mood, full affect  Wt Readings from Last 3 Encounters:  06/03/19 139 lb (63 kg)  02/13/19 136 lb (61.7 kg)  11/28/18 134 lb (60.8 kg)    Studies/Labs Reviewed:   EKG:  EKG is ordered and shows SR, normal ECG.  Recent Labs: 08/01/2018: ALT 31; BUN 26; Creatinine, Ser 1.00; Hemoglobin 13.5; Platelets 333.0; Potassium 4.7; Sodium 140; TSH 1.45   Lipid Panel    Component Value Date/Time   CHOL 176 08/01/2018 1027   CHOL 165 01/07/2017 1117   TRIG 135.0 08/01/2018 1027   HDL 56.40 08/01/2018 1027   HDL 61 01/07/2017 1117   CHOLHDL 3 08/01/2018 1027   VLDL 27.0 08/01/2018 1027   LDLCALC 93 08/01/2018 1027   LDLCALC 84 01/07/2017 1117   LDLDIRECT 101.0 10/30/2016 1051   Additional studies/ records that were reviewed today include:  Calcium scoring in 2013: 0  EKG performed today 01/31/2018 shows normal sinus rhythm normal EKG unchanged from prior.  This was personally reviewed.    ASSESSMENT:    1. Hyperlipidemia, unspecified hyperlipidemia type   2. FH: premature coronary heart disease   3. Family history of early CAD   4. History of depression    PLAN:  In order of problems listed above:  1. The patient is completely asymptomatic. Her family h/o early CAD is very significant however the fact that she had normal calcium score 4 years ago is reassuring.we will continue primary prevention with atorvastatin. She has no signs of cholesterol deposits - xanthomas, xanthelasmas.  She is advised to perform your kind of physical activity for 30 minutes 5 times a week. 2. Hyperlipidemia -on  atorvastatin and fenofibrate with excellent results, will recheck today.  Medication Adjustments/Labs and Tests Ordered: Current medicines are reviewed at length with the patient today.  Concerns regarding medicines are outlined above.  Medication changes, Labs and Tests ordered today are listed in the Patient Instructions below. Patient Instructions  Medication Instructions:   Your physician recommends that you continue on your current medications as directed. Please refer to the Current Medication list given to you today.  *If you need a refill on your cardiac medications before your next appointment, please call your pharmacy*   Lab Work:  TODAY-CMET, CBC, TSH, LIPIDS, AND LITHIUM LEVEL  If you have labs (blood work) drawn today and your tests are completely normal, you will receive your results only by: Marland Kitchen. MyChart Message (if you have MyChart) OR . A paper copy in the mail If you have any lab test that is abnormal or we need to change your treatment, we will call you to review the results.    Follow-Up: At [facility], you and your health needs are our priority.  As part of our continuing mission to provide you with exceptional heart care, we have created designated Provider Care Teams.  These Care Teams include your primary Cardiologist (physician) and Advanced Practice Providers (APPs -  Physician Assistants and Nurse Practitioners) who all work together to provide you with the care you need, when you need it.  Your next appointment:   12 months  The format for your next appointment:   Either In Person or Virtual  Provider:   Tobias AlexanderKatarina Shadi Larner, MD       Signed, Tobias AlexanderKatarina Tacoya Altizer, MD  06/03/2019 8:36 AM    [facility] [address], [place], [place]  [address] Phone: [phone]; Fax: [phone]

## 2019-06-03 NOTE — Patient Instructions (Signed)
Medication Instructions:   Your physician recommends that you continue on your current medications as directed. Please refer to the Current Medication list given to you today.  *If you need a refill on your cardiac medications before your next appointment, please call your pharmacy*   Lab Work:  TODAY-CMET, CBC, TSH, LIPIDS, AND LITHIUM LEVEL  If you have labs (blood work) drawn today and your tests are completely normal, you will receive your results only by: Marland Kitchen MyChart Message (if you have MyChart) OR . A paper copy in the mail If you have any lab test that is abnormal or we need to change your treatment, we will call you to review the results.    Follow-Up: At [facility], you and your health needs are our priority.  As part of our continuing mission to provide you with exceptional heart care, we have created designated Provider Care Teams.  These Care Teams include your primary Cardiologist (physician) and Advanced Practice Providers (APPs -  Physician Assistants and Nurse Practitioners) who all work together to provide you with the care you need, when you need it.  Your next appointment:   12 months  The format for your next appointment:   Either In Person or Virtual  Provider:   Ena Dawley, MD

## 2019-06-04 ENCOUNTER — Telehealth: Payer: Self-pay | Admitting: *Deleted

## 2019-06-04 DIAGNOSIS — R7989 Other specified abnormal findings of blood chemistry: Secondary | ICD-10-CM

## 2019-06-04 NOTE — Telephone Encounter (Signed)
Called [facility] and added Free T3 and T4.  [facility] will fax over add on order for Dr. Meda Coffee to sign.  Pt made aware of this and knows we will call her back once this result is complete and resulted. 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 Pt states she will also let her MD who follows this know that we added on a Free T3 and Free T4.  Pt verbalized understanding and agrees with this plan.

## 2019-06-04 NOTE — Telephone Encounter (Signed)
-----   Message from Dorothy Spark, MD sent at 06/03/2019  6:59 PM EST ----- normal kidney, liver function, normal lipids, lithium level,  thyroid function abnormal, could we send additional fT3 and fT4?

## 2019-06-05 NOTE — Telephone Encounter (Signed)
So appears that the tsh is suppressed and t4 is elevated    reflecting too much thyroid hormone in your system . Not sure why this has happened sometimes change in the generic sometimes interacting with medications ,    And food vitamins etc.   If accurate we should decrease your dose of medication and fu   We could repeat the test to make sure this is that off  .   Are you taking the levothyroxine differently  With only water and  45-60 min pre food or other meds ?\  Has the pill thyroid  manufacturer  changed  Or medication changes?   Let me know and then we can decide

## 2019-06-08 NOTE — Telephone Encounter (Signed)
Yes it is ok  If she wants to  repeat this week but  Tell her that any changes she made wont show up  To steady state for 6-8 weeks

## 2019-06-10 ENCOUNTER — Other Ambulatory Visit: Payer: Self-pay

## 2019-06-10 ENCOUNTER — Other Ambulatory Visit (INDEPENDENT_AMBULATORY_CARE_PROVIDER_SITE_OTHER): Payer: 59

## 2019-06-10 DIAGNOSIS — E039 Hypothyroidism, unspecified: Secondary | ICD-10-CM

## 2019-06-10 LAB — T3, FREE
T3, Free: 3.3 pg/mL (ref 2.0–4.4)
T3, Free: 3.5 pg/mL (ref 2.3–4.2)

## 2019-06-10 LAB — T4, FREE
Free T4: 1.94 ng/dL — ABNORMAL HIGH (ref 0.82–1.77)
Free T4: 1.19 ng/dL (ref 0.60–1.60)

## 2019-06-10 LAB — TSH: TSH: 1.53 u[IU]/mL (ref 0.35–4.50)

## 2019-06-10 LAB — SPECIMEN STATUS REPORT

## 2019-06-14 ENCOUNTER — Other Ambulatory Visit: Payer: Self-pay | Admitting: Internal Medicine

## 2019-08-10 ENCOUNTER — Encounter: Payer: Self-pay | Admitting: Gynecologic Oncology

## 2019-09-21 ENCOUNTER — Other Ambulatory Visit: Payer: Self-pay | Admitting: Internal Medicine

## 2019-10-07 MED ORDER — LOSARTAN POTASSIUM 25 MG PO TABS: 25.0000 mg | ORAL_TABLET | Freq: Every day | ORAL | 1 refills | Status: DC

## 2019-10-07 NOTE — Telephone Encounter (Signed)
Lars Masson, MD  Loa Socks, LPN  Hi Lisa Lee,  I would recommend to start doing 30 minutes of any exercise including walking 5 times a week, also follow closely low-sodium diet ideally Mediterranean style diet with a lot of fresh produce, I would start you on losartan 25 mg daily, this can be temporary, please continue send [place] these blood pressure measurements and we can eliminate these in the future if your blood pressure improves or becomes lower however long-term side effects of high blood pressure are much more significant than taking medications I would strongly encourage that you start taking it for now.  Hope you are doing well.   Jason Fila with the pt and informed her of recommendations as mentioned above per Dr. Delton See.  Confirmed the pharmacy of choice with the pt.  Also sent her the message from Dr. Delton See with these same recommendations to her active mychart account. Pt verbalized understanding and agrees with this plan.

## 2019-10-15 MED ORDER — LOSARTAN POTASSIUM 50 MG PO TABS: 50.0000 mg | ORAL_TABLET | Freq: Every day | ORAL | 0 refills | Status: DC

## 2019-10-15 NOTE — Telephone Encounter (Signed)
Lisa Masson, MD  Lisa Lee, Lisa D "[name]" 3 minutes ago (9:34 AM)   Yes, please increase to 50 mg po daily   Sent in new dose increase of losartan to 50 mg po daily as advised by Dr. Delton See.  Pt made aware of dose increase via her mychart account. Pt is aware that I sent in the dose increase to her confirmed pharmacy of choice and made a note to pharmacy that pt will use her current supply of 25 mg tablets of losartan and will call for refills of her new dose when that supply exhaust.  Pt verbalized understanding and agrees with this plan.

## 2019-10-22 ENCOUNTER — Other Ambulatory Visit: Payer: Self-pay | Admitting: Cardiology

## 2019-10-22 ENCOUNTER — Telehealth: Payer: Self-pay

## 2019-10-22 MED ORDER — LOSARTAN POTASSIUM 50 MG PO TABS: 50.0000 mg | ORAL_TABLET | Freq: Every day | ORAL | 2 refills | Status: DC

## 2019-10-22 NOTE — Telephone Encounter (Signed)
I spoke to the patient with Dr Lindaann Slough recommendation.  She verbalized understanding.

## 2019-10-24 ENCOUNTER — Other Ambulatory Visit: Payer: Self-pay | Admitting: Physician Assistant

## 2019-10-25 NOTE — Telephone Encounter (Signed)
Last visit 02/27/2019, was due back 6 months

## 2019-10-30 ENCOUNTER — Ambulatory Visit: Payer: 59 | Attending: Internal Medicine

## 2019-10-30 DIAGNOSIS — Z20822 Contact with and (suspected) exposure to covid-19: Secondary | ICD-10-CM

## 2019-10-31 LAB — NOVEL CORONAVIRUS, NAA: SARS-CoV-2, NAA: DETECTED — AB

## 2019-10-31 LAB — SARS-COV-2, NAA 2 DAY TAT

## 2019-11-01 ENCOUNTER — Telehealth (HOSPITAL_COMMUNITY): Payer: Self-pay | Admitting: Nurse Practitioner

## 2019-11-01 ENCOUNTER — Other Ambulatory Visit (HOSPITAL_COMMUNITY): Payer: Self-pay | Admitting: Nurse Practitioner

## 2019-11-01 ENCOUNTER — Encounter: Payer: Self-pay | Admitting: Nurse Practitioner

## 2019-11-01 DIAGNOSIS — U071 COVID-19: Secondary | ICD-10-CM

## 2019-11-01 MED ORDER — SODIUM CHLORIDE 0.9 % IV SOLN
700.0000 mg | Freq: Once | INTRAVENOUS | Status: AC
  Administered 2019-11-02: 700 mg via INTRAVENOUS
  Filled 2019-11-01: qty 700

## 2019-11-01 NOTE — Progress Notes (Signed)
I connected by phone with Lisa Lee on 11/01/2019 at 8:40 AM to discuss the potential use of an new treatment for mild to moderate COVID-19 viral infection in non-hospitalized patients.  This patient is a 59 y.o. female that meets the FDA criteria for Emergency Use Authorization of bamlanivimab or casirivimab\imdevimab.  Has a (+) direct SARS-CoV-2 viral test result  Has mild or moderate COVID-19   Is ? 60 years of age and weighs ? 40 kg  Is NOT hospitalized due to COVID-19  Is NOT requiring oxygen therapy or requiring an increase in baseline oxygen flow rate due to COVID-19  Is within 10 days of symptom onset  Has at least one of the high risk factor(s) for progression to severe COVID-19 and/or hospitalization as defined in EUA.  Specific high risk criteria : Hypertension   I have spoken and communicated the following to the patient or parent/caregiver:  1. FDA has authorized the emergency use of bamlanivimab and casirivimab\imdevimab for the treatment of mild to moderate COVID-19 in adults and pediatric patients with positive results of direct SARS-CoV-2 viral testing who are 57 years of age and older weighing at least 40 kg, and who are at high risk for progressing to severe COVID-19 and/or hospitalization.  2. The significant known and potential risks and benefits of bamlanivimab and casirivimab\imdevimab, and the extent to which such potential risks and benefits are unknown.  3. Information on available alternative treatments and the risks and benefits of those alternatives, including clinical trials.  4. Patients treated with bamlanivimab and casirivimab\imdevimab should continue to self-isolate and use infection control measures (e.g., wear mask, isolate, social distance, avoid sharing personal items, clean and disinfect "high touch" surfaces, and frequent handwashing) according to CDC guidelines.   5. The patient or parent/caregiver has the option to accept or refuse  bamlanivimab or casirivimab\imdevimab .  After reviewing this information with the patient, The patient agreed to proceed with receiving the bamlanimivab infusion and will be provided a copy of the Fact sheet prior to receiving the infusion.[name], DNP, AGNP-C [phone] (Infusion Center Hotline)

## 2019-11-01 NOTE — Telephone Encounter (Signed)
Called to Discuss with patient about Covid symptoms and the use of bamlanivimab, a monoclonal antibody infusion for those with mild to moderate Covid symptoms and at a high risk of hospitalization.     Pt is qualified for this infusion at the [facility] infusion center due to co-morbid conditions and/or a member of an at-risk group.     Onset of symptoms, last Wednesday. Has received 2 vaccines. 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 See orders encounter for further details.   [name], DNP, AGNP-C [phone] (Infusion Center Hotline)

## 2019-11-02 ENCOUNTER — Ambulatory Visit (HOSPITAL_COMMUNITY)
Admission: RE | Admit: 2019-11-02 | Discharge: 2019-11-02 | Disposition: A | Discharge status: Home / Self Care | Payer: 59 | Source: Ambulatory Visit | Attending: Pulmonary Disease | Admitting: Pulmonary Disease

## 2019-11-02 DIAGNOSIS — U071 COVID-19: Secondary | ICD-10-CM | POA: Insufficient documentation

## 2019-11-02 MED ORDER — EPINEPHRINE 0.3 MG/0.3ML IJ SOAJ: 0.3000 mg | Freq: Once | INTRAMUSCULAR | Status: DC | PRN

## 2019-11-02 MED ORDER — ALBUTEROL SULFATE HFA 108 (90 BASE) MCG/ACT IN AERS: 2.0000 | INHALATION_SPRAY | Freq: Once | RESPIRATORY_TRACT | Status: DC | PRN

## 2019-11-02 MED ORDER — FAMOTIDINE IN NACL 20-0.9 MG/50ML-% IV SOLN: 20.0000 mg | Freq: Once | INTRAVENOUS | Status: DC | PRN

## 2019-11-02 MED ORDER — METHYLPREDNISOLONE SODIUM SUCC 125 MG IJ SOLR: 125.0000 mg | Freq: Once | INTRAMUSCULAR | Status: DC | PRN

## 2019-11-02 MED ORDER — DIPHENHYDRAMINE HCL 50 MG/ML IJ SOLN: 50.0000 mg | Freq: Once | INTRAMUSCULAR | Status: DC | PRN

## 2019-11-02 MED ORDER — SODIUM CHLORIDE 0.9 % IV SOLN
INTRAVENOUS | Status: DC | PRN
  Administered 2019-11-02: 10:00:00 250 mL via INTRAVENOUS

## 2019-11-02 NOTE — Discharge Instructions (Signed)

## 2019-11-02 NOTE — Progress Notes (Signed)
  Diagnosis: COVID-19  Physician: Dr. Wright  Procedure: [facility] Infusion Clinic Med: bamlanivimab infusion - Provided patient with bamlanimivab fact sheet for patients, parents and caregivers prior to infusion.  Complications: No immediate complications noted.  Discharge: Discharged home   Lisa Lee Shakti Fleer 11/02/2019  

## 2019-11-02 NOTE — Telephone Encounter (Signed)
Not sure I have enough info  But he would still need to be quarantine if you had a positive test and  He was not vaccinated   7 days if testing neg after 5 days  Exposures . Otherwise 10 days  But cdc guidelines say not needed if no sx and  vully vaccinated   After 2 weeks etc.     Are you    In touch with the  ID people   About  Infection after vaccination?    It seems that you had antigen testing    He would prob need the pcr if later .   Your husband may need to make a visit with his pcp virtual if  Not clear

## 2019-11-12 ENCOUNTER — Ambulatory Visit: Payer: Self-pay | Admitting: Physician Assistant

## 2019-11-15 ENCOUNTER — Other Ambulatory Visit: Payer: Self-pay | Admitting: Internal Medicine

## 2019-11-16 ENCOUNTER — Other Ambulatory Visit: Payer: Self-pay

## 2019-11-16 MED ORDER — LOSARTAN POTASSIUM 50 MG PO TABS: 50.0000 mg | ORAL_TABLET | Freq: Every day | ORAL | 2 refills | Status: DC

## 2019-11-17 ENCOUNTER — Encounter: Payer: Self-pay | Admitting: Physician Assistant

## 2019-11-17 ENCOUNTER — Ambulatory Visit (INDEPENDENT_AMBULATORY_CARE_PROVIDER_SITE_OTHER): Payer: 59 | Admitting: Physician Assistant

## 2019-11-17 ENCOUNTER — Other Ambulatory Visit: Payer: Self-pay

## 2019-11-17 DIAGNOSIS — F319 Bipolar disorder, unspecified: Secondary | ICD-10-CM

## 2019-11-17 DIAGNOSIS — F411 Generalized anxiety disorder: Secondary | ICD-10-CM

## 2019-11-17 DIAGNOSIS — G4700 Insomnia, unspecified: Secondary | ICD-10-CM | POA: Diagnosis not present

## 2019-11-17 LAB — CBC AND DIFFERENTIAL: Hemoglobin: 12.8 (ref 12.0–16.0)

## 2019-11-17 LAB — HM PAP SMEAR

## 2019-11-17 MED ORDER — ALPRAZOLAM 0.5 MG PO TABS: 0.2500 mg | ORAL_TABLET | Freq: Two times a day (BID) | ORAL | 1 refills | Status: DC | PRN

## 2019-11-17 MED ORDER — MODAFINIL 200 MG PO TABS: 100.0000 mg | ORAL_TABLET | Freq: Every day | ORAL | 5 refills | Status: DC

## 2019-11-17 MED ORDER — ESCITALOPRAM OXALATE 20 MG PO TABS: 20.0000 mg | ORAL_TABLET | Freq: Every day | ORAL | 1 refills | Status: DC

## 2019-11-17 MED ORDER — LITHIUM CARBONATE ER 300 MG PO TBCR: 300.0000 mg | EXTENDED_RELEASE_TABLET | Freq: Two times a day (BID) | ORAL | 1 refills | Status: DC

## 2019-11-17 NOTE — Progress Notes (Signed)
[facility]  Patient ID: Lisa Lee,  MRN: [id number]  PCP: Burnis Medin, MD  Date of Evaluation: 11/17/2019 Time spent:20 minutes  Chief Complaint:  Chief Complaint    Anxiety; Depression; Insomnia      HISTORY/CURRENT STATUS:  HPI For routine med check.   Feels kind of depressed. When she gets more stressed, gets depressed. Has a lot going on right now. See Social Hx, and ROS. But feels a lot of what she is feeling is due to circumstances.  She is able to work full-time.  She is a Emergency planning/management officer.  She is able to enjoy things most of the time.  Energy and motivation are good.  Not isolating.  No suicidal or homicidal thoughts.  States she is more anxious when thinking about things that are going on, her daughter being pregnant, COVID, work, and just normal life things are a bit overwhelming right now.  She is not really having full-blown panic attacks but does get anxious and sometimes jittery because of the anxiety.  Gets enough to not want to do certain things because she is afraid it will make her anxious.  Sleeps well with Unisom as needed.  She does get anxious when triggered but gets not a constant thing.  The modafinil does help some with her focus and motivation.  She can certainly tell the difference when she does not take it.  Patient denies increased energy with decreased need for sleep, no increased talkativeness, no racing thoughts, no impulsivity or risky behaviors, no increased spending, no increased libido, no grandiosity, no increased irritability or anger, and no hallucinations.  Denies dizziness, syncope, seizures, numbness, tingling, tremor, reports a mouth movement that may be a tic that she has noticed in the past several months, her mouth will move kind of like she might be trying to sniffle or scratch her nose without using the finger.  It comes and goes but has been noticeable enough to the point that one of her grandchildren  mentioned it to her.  Denies unsteady gait, slurred speech, confusion. Denies muscle or joint pain, stiffness, or dystonia.  Individual Medical History/ Review of Systems: Changes? :Yes   Had covid a few weeks ago. Also dx w/ HTN is a new diagnosis.  Past medications for mental health diagnoses include: Lexapro, lithium  Allergies: Penicillins, Atorvastatin, Pravastatin, and Rosuvastatin  Current Medications:  Current Outpatient Medications:  .  aspirin EC 81 MG tablet, Take 1 tablet (81 mg total) by mouth daily., Disp: , Rfl:  .  atorvastatin (LIPITOR) 10 MG tablet, Take 1 tablet (10 mg total) by mouth daily., Disp: 90 tablet, Rfl: 3 .  Cholecalciferol (VITAMIN D) 2000 units tablet, Take 2,000 Units by mouth daily., Disp: , Rfl:  .  dextromethorphan-guaiFENesin (MUCINEX DM) 30-600 MG 12hr tablet, Take 1 tablet by mouth 2 (two) times daily as needed for cough., Disp: , Rfl:  .  doxylamine, Sleep, (UNISOM) 25 MG tablet, Take 25 mg by mouth at bedtime as needed., Disp: , Rfl:  .  escitalopram (LEXAPRO) 20 MG tablet, Take 1 tablet (20 mg total) by mouth daily., Disp: 90 tablet, Rfl: 1 .  famotidine (PEPCID) 20 MG tablet, TAKE 1 TABLET AT BEDTIME, Disp: 90 tablet, Rfl: 1 .  fenofibrate 160 MG tablet, TAKE 1 TABLET BY MOUTH EVERY DAY., Disp: 90 tablet, Rfl: 3 .  levothyroxine (SYNTHROID) 75 MCG tablet, TAKE 1 TABLET DAILY, Disp: 90 tablet, Rfl: 1 .  lithium carbonate (LITHOBID) 300 MG  CR tablet, Take 1 tablet (300 mg total) by mouth 2 (two) times daily., Disp: 180 tablet, Rfl: 1 .  loperamide (IMODIUM A-D) 2 MG tablet, Take 1 mg by mouth daily. , Disp: , Rfl:  .  losartan (COZAAR) 50 MG tablet, Take 1 tablet (50 mg total) by mouth daily., Disp: 90 tablet, Rfl: 2 .  modafinil (PROVIGIL) 200 MG tablet, Take 0.5 tablets (100 mg total) by mouth daily., Disp: 30 tablet, Rfl: 5 .  Multiple Vitamins-Minerals (MULTIVITAMIN WITH MINERALS) tablet, Take 1 tablet by mouth daily., Disp: , Rfl:  .   pantoprazole (PROTONIX) 40 MG tablet, Take 1 tablet (40 mg total) by mouth daily. Please schedule follow up in July for further refills., Disp: 90 tablet, Rfl: 0 .  valACYclovir (VALTREX) 500 MG tablet, TAKE 1 TABLET DAILY FOR SUPPRESSION AND 1 TABLET TWICE A DAY FOR 3 DAYS FOR OUTBREAK, Disp: 30 tablet, Rfl: 13 .  albuterol (PROAIR HFA) 108 (90 Base) MCG/ACT inhaler, Inhale 2 puffs into the lungs every 6 (six) hours as needed for wheezing. (Patient not taking: Reported on 11/17/2019), Disp: 3 Inhaler, Rfl: 0 .  ALPRAZolam (XANAX) 0.5 MG tablet, Take 0.5-1 tablets (0.25-0.5 mg total) by mouth 2 (two) times daily as needed for anxiety., Disp: 30 tablet, Rfl: 1 .  YUVAFEM 10 MCG TABS vaginal tablet, INSERT 1 TABLET VAGINALLY TWICE A WEEK (Patient not taking: Reported on 11/17/2019), Disp: 8 tablet, Rfl: 12 Medication Side Effects: none  Family Medical/ Social History: Changes? Yes  Her dtr is pregnant.   MENTAL HEALTH EXAM:  Last menstrual period 12/09/2013.There is no height or weight on file to calculate BMI.  General Appearance: Casual, Neat and Well Groomed  Eye Contact:  Good  Speech:  Clear and Coherent and Normal Rate  Volume:  Normal  Mood:  Euthymic  Affect:  Appropriate  Thought Process:  Goal Directed and Descriptions of Associations: Intact  Orientation:  Full (Time, Place, and Person)  Thought Content: Logical   Suicidal Thoughts:  No  Homicidal Thoughts:  No  Memory:  WNL  Judgement:  Good  Insight:  Good  Psychomotor Activity:  Normal and No visible tic during exam.  Concentration:  Concentration: Good  Recall:  Good  Fund of Knowledge: Good  Language: Good  Assets:  Desire for Improvement  ADL's:  Intact  Cognition: WNL  Prognosis:  Good  06/03/2019  Lithium level  0.8, BUN 28, Cr 1.04, 06/10/2019 TSH 1.53.   DIAGNOSES:    ICD-10-CM   1. Bipolar I disorder (HCC)  F31.9   2. Insomnia, unspecified type  G47.00   3. Generalized anxiety disorder  F41.1      Receiving Psychotherapy: No    RECOMMENDATIONS:  PDMP was reviewed. I spent 20 minutes with her. She will watch the abnormal mouth movement.  If it worsens, let me know. We discussed adding a benzo to help with the situational anxiety.  She would like to try it.  Benefits, risks, side effects were discussed and she accepts. Start Xanax 0.5 mg, 1/2-1 twice daily as needed. Continue Lexapro 20 mg, 1 p.o. daily. Continue lithium CR 300 mg, 1 p.o. twice daily. Continue modafinil 200 mg, 1/2 pill every morning. Continue vitamins and supplements as listed on the med sheet. Return in 6 weeks.   Melony Overly, PA-C

## 2019-11-20 ENCOUNTER — Other Ambulatory Visit: Payer: Self-pay | Admitting: Obstetrics and Gynecology

## 2019-11-20 DIAGNOSIS — N6320 Unspecified lump in the left breast, unspecified quadrant: Secondary | ICD-10-CM

## 2019-12-03 ENCOUNTER — Ambulatory Visit
Admission: RE | Admit: 2019-12-03 | Discharge: 2019-12-03 | Disposition: A | Discharge status: Home / Self Care | Payer: 59 | Source: Ambulatory Visit | Attending: Obstetrics and Gynecology | Admitting: Obstetrics and Gynecology

## 2019-12-03 ENCOUNTER — Other Ambulatory Visit: Payer: Self-pay

## 2019-12-03 DIAGNOSIS — N6320 Unspecified lump in the left breast, unspecified quadrant: Secondary | ICD-10-CM

## 2019-12-30 ENCOUNTER — Encounter: Payer: Self-pay | Admitting: Gastroenterology

## 2019-12-31 ENCOUNTER — Ambulatory Visit: Payer: 59 | Admitting: Psychiatry

## 2019-12-31 ENCOUNTER — Ambulatory Visit: Payer: 59 | Admitting: Physician Assistant

## 2020-01-04 ENCOUNTER — Other Ambulatory Visit: Payer: Self-pay

## 2020-01-04 NOTE — Telephone Encounter (Signed)
[facility] because we received refill request for losartan 25 mg daily and last telephone note stated pt takes losartan 50 mg daily. I explained I will not be able to refill the 25 mg and can they release the 50 mg they have on hold. [facility] said all patient has to do is call them to release hold on losartan 50 mg and they will send it to her. I called and spoke with patient and told her what to do and she said she will do it and verified that yes she takes losartan 50 mg daily.

## 2020-01-21 ENCOUNTER — Other Ambulatory Visit: Payer: Self-pay

## 2020-01-21 ENCOUNTER — Encounter: Payer: Self-pay | Admitting: Physician Assistant

## 2020-01-21 ENCOUNTER — Ambulatory Visit (INDEPENDENT_AMBULATORY_CARE_PROVIDER_SITE_OTHER): Payer: 59 | Admitting: Physician Assistant

## 2020-01-21 DIAGNOSIS — Z79899 Other long term (current) drug therapy: Secondary | ICD-10-CM | POA: Diagnosis not present

## 2020-01-21 DIAGNOSIS — F411 Generalized anxiety disorder: Secondary | ICD-10-CM

## 2020-01-21 DIAGNOSIS — G4700 Insomnia, unspecified: Secondary | ICD-10-CM

## 2020-01-21 MED ORDER — LITHIUM CARBONATE ER 450 MG PO TBCR: 450.0000 mg | EXTENDED_RELEASE_TABLET | Freq: Two times a day (BID) | ORAL | 1 refills | Status: DC

## 2020-01-21 NOTE — Progress Notes (Addendum)
[facility]  Patient ID: Lisa Lee,  MRN: [id number]  PCP: Madelin Headings, MD  Date of Evaluation: 01/21/2020 Time spent:30 minutes  Chief Complaint:  Chief Complaint    Anxiety; Depression; Insomnia      HISTORY/CURRENT STATUS: HPI For routine med check.  Having a hard time lately.  Problems with her husband that they're working on. Also her dtr is [redacted] weeks pregnant and everything is going good, but it brings back bad memories of when she lost her son at 80 weeks.  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  There is just a lot going on that makes her feel sad.    She has not missed any work due to the way she feels but she has thought it would be nice not to have to go in sometimes.  Energy and motivation are low.  Having a hard time enjoying things. She has had some passive suicidal thoughts.  She has no plan.  It is more of a feeling of 'I do not want to be here any longer' type thing.  In the past, the only thing that helped with suicidal thoughts was the lithium.  Does take the Xanax.  But only rarely.  States she probably has 25 out of 30 pills that she was given back in April.  She takes it sometimes just to help stop the thoughts in her head so she can go to sleep.  She does take the modafinil on the days that she works.  She is only taken the equivalent of about 50 mg.  It has helped with the falling asleep during the daytime symptoms.  Patient denies increased energy with decreased need for sleep, no increased talkativeness, no racing thoughts, no impulsivity or risky behaviors, no increased spending, no increased libido, no grandiosity, no increased irritability or anger, and no hallucinations.  Denies dizziness, syncope, seizures, numbness, tingling, tremor, tics, unsteady gait, slurred speech, confusion. Denies muscle or joint pain, stiffness, or dystonia.  Individual Medical  History/ Review of Systems: Changes? :No    Past medications for mental health diagnoses include: Lexapro, lithium  Allergies: Penicillins, Pravastatin, and Rosuvastatin  Current Medications:  Current Outpatient Medications:  .  ALPRAZolam (XANAX) 0.5 MG tablet, Take 0.5-1 tablets (0.25-0.5 mg total) by mouth 2 (two) times daily as needed for anxiety., Disp: 30 tablet, Rfl: 1 .  aspirin EC 81 MG tablet, Take 1 tablet (81 mg total) by mouth daily., Disp: , Rfl:  .  atorvastatin (LIPITOR) 10 MG tablet, Take 1 tablet (10 mg total) by mouth daily., Disp: 90 tablet, Rfl: 3 .  Cholecalciferol (VITAMIN D) 2000 units tablet, Take 2,000 Units by mouth daily., Disp: , Rfl:  .  doxylamine, Sleep, (UNISOM) 25 MG tablet, Take 25 mg by mouth at bedtime as needed., Disp: , Rfl:  .  escitalopram (LEXAPRO) 20 MG tablet, Take 1 tablet (20 mg total) by mouth daily., Disp: 90 tablet, Rfl: 1 .  famotidine (PEPCID) 20 MG tablet, TAKE 1 TABLET AT BEDTIME, Disp: 90 tablet, Rfl: 1 .  fenofibrate 160 MG tablet, TAKE 1 TABLET BY MOUTH EVERY DAY., Disp: 90 tablet, Rfl: 3 .  levothyroxine (SYNTHROID) 75 MCG tablet, TAKE 1 TABLET DAILY, Disp: 90 tablet, Rfl: 1 .  loperamide (IMODIUM A-D) 2 MG tablet, Take 1 mg by mouth daily. , Disp: , Rfl:  .  losartan (COZAAR) 50 MG tablet, Take 1  tablet (50 mg total) by mouth daily., Disp: 90 tablet, Rfl: 2 .  modafinil (PROVIGIL) 200 MG tablet, Take 0.5 tablets (100 mg total) by mouth daily. (Patient taking differently: Take 50 mg by mouth daily. ), Disp: 30 tablet, Rfl: 5 .  Multiple Vitamins-Minerals (MULTIVITAMIN WITH MINERALS) tablet, Take 1 tablet by mouth daily., Disp: , Rfl:  .  pantoprazole (PROTONIX) 40 MG tablet, Take 1 tablet (40 mg total) by mouth daily. Please schedule follow up in July for further refills., Disp: 90 tablet, Rfl: 0 .  valACYclovir (VALTREX) 500 MG tablet, TAKE 1 TABLET DAILY FOR SUPPRESSION AND 1 TABLET TWICE A DAY FOR 3 DAYS FOR OUTBREAK, Disp: 30 tablet,  Rfl: 13 .  albuterol (PROAIR HFA) 108 (90 Base) MCG/ACT inhaler, Inhale 2 puffs into the lungs every 6 (six) hours as needed for wheezing. (Patient not taking: Reported on 01/21/2020), Disp: 3 Inhaler, Rfl: 0 .  dextromethorphan-guaiFENesin (MUCINEX DM) 30-600 MG 12hr tablet, Take 1 tablet by mouth 2 (two) times daily as needed for cough. (Patient not taking: Reported on 01/21/2020), Disp: , Rfl:  .  lithium carbonate (ESKALITH) 450 MG CR tablet, Take 1 tablet (450 mg total) by mouth 2 (two) times daily., Disp: 60 tablet, Rfl: 1 .  YUVAFEM 10 MCG TABS vaginal tablet, INSERT 1 TABLET VAGINALLY TWICE A WEEK (Patient not taking: Reported on 11/17/2019), Disp: 8 tablet, Rfl: 12 Medication Side Effects: none  Family Medical/ Social History: Changes? No  MENTAL HEALTH EXAM:  Last menstrual period 12/09/2013.There is no height or weight on file to calculate BMI.  General Appearance: Casual, Neat and Well Groomed  Eye Contact:  Good  Speech:  Clear and Coherent and Normal Rate  Volume:  Normal  Mood:  Depressed  Affect:  Depressed  Thought Process:  Goal Directed and Descriptions of Associations: Intact  Orientation:  Full (Time, Place, and Person)  Thought Content: Logical   Suicidal Thoughts:  Yes.  without intent/plan  Homicidal Thoughts:  No  Memory:  WNL  Judgement:  Good  Insight:  Good  Psychomotor Activity:  Normal  Concentration:  Concentration: Good  Recall:  Good  Fund of Knowledge: Good  Language: Good  Assets:  Desire for Improvement  ADL's:  Intact  Cognition: WNL  Prognosis:  Good   06/03/2019 lithium level was 0.8.  DIAGNOSES:    ICD-10-CM   1. Encounter for long-term (current) use of medications  Z79.899 Lithium level    Basic metabolic panel    Receiving Psychotherapy: No    RECOMMENDATIONS:  PDMP was reviewed. I provided 30 minutes of face-to-face time during this encounter. Contract for safety is in place.  She knows to call our office or go to the emergency  room if the suicidal thoughts worsen. We discussed the diagnosis and treatment options.  We talked about counseling again.  I had recommended that at the last visit but the counselor I recommended was the same 1 she saw around 28 years ago when she went through the stillbirth of her son.  She did not want to see her again which is understandable.  I gave her several different names including Burney Gauze, Dr. Allyson Sabal, Regino Bellow, and Dr. Delorse Lek.  Or she can seek the help from any other counselor, but she does need to see someone. Since she has responded to the lithium well in the past, we agreed to increase that. Increase lithium CR 450 mg, 1 p.o. twice daily.  (She can use what she  has now of the 300 mg to equal 900 mg daily or she can save those for another time.) Continue Xanax 0.5 mg, 1/2-1 p.o. twice daily as needed. Continue Lexapro 20 mg, 1 p.o. every morning. Continue modafinil 200 mg, 1/4-1/2 p.o. every morning as needed. Draw lithium level and BMP in 5 to 7 days. Return in 4 to 6 weeks.  Donnal Moat, PA-C

## 2020-01-29 ENCOUNTER — Encounter: Payer: Self-pay | Admitting: Physician Assistant

## 2020-01-29 NOTE — Progress Notes (Signed)
Lithium was 0.7. BUN/CR were nl. Continue same doses

## 2020-02-16 NOTE — Telephone Encounter (Signed)
So I dont think you are in danger  because of your Hx . But there are breakthrough testing positives with minimal sx .   Marland Kitchen So for    Information and  protection  Of others  You could consider testing . But dont feel compelled .  We are seeing many resp infections that usually are  Seen in winter delayed 6 mos.

## 2020-02-17 ENCOUNTER — Other Ambulatory Visit: Payer: Self-pay | Admitting: Internal Medicine

## 2020-02-19 ENCOUNTER — Other Ambulatory Visit: Payer: Self-pay

## 2020-02-19 MED ORDER — LITHIUM CARBONATE ER 450 MG PO TBCR: 450.0000 mg | EXTENDED_RELEASE_TABLET | Freq: Two times a day (BID) | ORAL | 0 refills | Status: DC

## 2020-02-24 ENCOUNTER — Encounter: Payer: 59 | Admitting: Internal Medicine

## 2020-02-25 ENCOUNTER — Ambulatory Visit: Payer: 59 | Admitting: Physician Assistant

## 2020-02-25 ENCOUNTER — Other Ambulatory Visit: Payer: Self-pay

## 2020-02-25 ENCOUNTER — Other Ambulatory Visit: Payer: Self-pay | Admitting: Physician Assistant

## 2020-02-25 ENCOUNTER — Encounter: Payer: Self-pay | Admitting: Physician Assistant

## 2020-02-25 DIAGNOSIS — F411 Generalized anxiety disorder: Secondary | ICD-10-CM | POA: Diagnosis not present

## 2020-02-25 DIAGNOSIS — F319 Bipolar disorder, unspecified: Secondary | ICD-10-CM | POA: Diagnosis not present

## 2020-02-25 DIAGNOSIS — G4700 Insomnia, unspecified: Secondary | ICD-10-CM

## 2020-02-25 NOTE — Progress Notes (Signed)
[facility]  Patient ID: Lisa Lee,  MRN: [id number]  PCP: Madelin Headings, MD  Date of Evaluation: 02/25/2020 Time spent:20 minutes  Chief Complaint:  Chief Complaint    Follow-up      HISTORY/CURRENT STATUS: HPI For routine med check.  Doing much better since we increased the Li.  Feels that anxiety and depression both are a lot better.  She is able to enjoy things.  Energy and motivation are good.  Appetite and weight are stable.  No suicidal or homicidal thoughts.  Anxiety is controlled.  She is sleeping well.  She recently had a chest cold and is still coughing but otherwise has been okay.  Work is going well.  Her daughter's due date is in approximately 3 weeks, and she is looking forward to the birth of her grandchild.  Patient denies increased energy with decreased need for sleep, no increased talkativeness, no racing thoughts, no impulsivity or risky behaviors, no increased spending, no increased libido, no grandiosity, no increased irritability or anger, and no hallucinations.  Denies dizziness, syncope, seizures, numbness, tingling, tremor, tics, unsteady gait, slurred speech, confusion. Denies muscle or joint pain, stiffness, or dystonia.  Individual Medical History/ Review of Systems: Changes? :No    Past medications for mental health diagnoses include: Lexapro, lithium  Allergies: Penicillins, Pravastatin, and Rosuvastatin  Current Medications:  Current Outpatient Medications:  .  ALPRAZolam (XANAX) 0.5 MG tablet, Take 0.5-1 tablets (0.25-0.5 mg total) by mouth 2 (two) times daily as needed for anxiety., Disp: 30 tablet, Rfl: 1 .  aspirin EC 81 MG tablet, Take 1 tablet (81 mg total) by mouth daily., Disp: , Rfl:  .  atorvastatin (LIPITOR) 10 MG tablet, Take 1 tablet (10 mg total) by mouth daily., Disp: 90 tablet, Rfl: 3 .  Cholecalciferol (VITAMIN D) 2000 units tablet, Take 2,000 Units by mouth daily., Disp: , Rfl:  .   dextromethorphan-guaiFENesin (MUCINEX DM) 30-600 MG 12hr tablet, Take 1 tablet by mouth 2 (two) times daily as needed for cough. , Disp: , Rfl:  .  doxylamine, Sleep, (UNISOM) 25 MG tablet, Take 25 mg by mouth at bedtime as needed., Disp: , Rfl:  .  escitalopram (LEXAPRO) 20 MG tablet, Take 1 tablet (20 mg total) by mouth daily., Disp: 90 tablet, Rfl: 1 .  famotidine (PEPCID) 20 MG tablet, TAKE 1 TABLET AT BEDTIME, Disp: 90 tablet, Rfl: 1 .  fenofibrate 160 MG tablet, TAKE 1 TABLET BY MOUTH EVERY DAY., Disp: 90 tablet, Rfl: 3 .  levothyroxine (SYNTHROID) 75 MCG tablet, TAKE 1 TABLET DAILY, Disp: 90 tablet, Rfl: 1 .  lithium carbonate (ESKALITH) 450 MG CR tablet, Take 1 tablet (450 mg total) by mouth 2 (two) times daily., Disp: 180 tablet, Rfl: 0 .  loperamide (IMODIUM A-D) 2 MG tablet, Take 1 mg by mouth daily. , Disp: , Rfl:  .  losartan (COZAAR) 50 MG tablet, Take 1 tablet (50 mg total) by mouth daily., Disp: 90 tablet, Rfl: 2 .  modafinil (PROVIGIL) 200 MG tablet, Take 0.5 tablets (100 mg total) by mouth daily. (Patient taking differently: Take 50 mg by mouth daily. ), Disp: 30 tablet, Rfl: 5 .  Multiple Vitamins-Minerals (MULTIVITAMIN WITH MINERALS) tablet, Take 1 tablet by mouth daily., Disp: , Rfl:  .  pantoprazole (PROTONIX) 40 MG tablet, Take 1 tablet (40 mg total) by mouth daily., Disp: 90 tablet, Rfl: 0 .  valACYclovir (VALTREX) 500 MG tablet, TAKE 1 TABLET DAILY FOR SUPPRESSION AND 1 TABLET TWICE A  DAY FOR 3 DAYS FOR OUTBREAK, Disp: 30 tablet, Rfl: 13 .  albuterol (PROAIR HFA) 108 (90 Base) MCG/ACT inhaler, Inhale 2 puffs into the lungs every 6 (six) hours as needed for wheezing. (Patient not taking: Reported on 01/21/2020), Disp: 3 Inhaler, Rfl: 0 .  YUVAFEM 10 MCG TABS vaginal tablet, INSERT 1 TABLET VAGINALLY TWICE A WEEK (Patient not taking: Reported on 11/17/2019), Disp: 8 tablet, Rfl: 12 Medication Side Effects: none  Family Medical/ Social History: Changes? No  MENTAL HEALTH  EXAM:  Last menstrual period 12/09/2013.There is no height or weight on file to calculate BMI.  General Appearance: Casual, Neat and Well Groomed  Eye Contact:  Good  Speech:  Clear and Coherent and Normal Rate  Volume:  Normal  Mood:  Euthymic  Affect:  Appropriate  Thought Process:  Goal Directed and Descriptions of Associations: Intact  Orientation:  Full (Time, Place, and Person)  Thought Content: Logical   Suicidal Thoughts:  No  Homicidal Thoughts:  No  Memory:  WNL  Judgement:  Good  Insight:  Good  Psychomotor Activity:  Normal  Concentration:  Concentration: Good and Attention Span: Good  Recall:  Good  Fund of Knowledge: Good  Language: Good  Assets:  Desire for Improvement  ADL's:  Intact  Cognition: WNL  Prognosis:  Good   01/28/2020 lithium level was 0.7. See BMP results on chart.  I am unable to get specific numbers, I am having computer problems.  DIAGNOSES:    ICD-10-CM   1. Bipolar I disorder (HCC)  F31.9   2. Generalized anxiety disorder  F41.1   3. Insomnia, unspecified type  G47.00     Receiving Psychotherapy: No    RECOMMENDATIONS:  PDMP was reviewed. I provided 20 minutes of face to face time during this encounter. We discussed the slightly elevated creatinine of 1.1.  I do not think it is significant but do think it needs to be repeated.  She will be seeing her PCP next month and will have routine labs then.  She will have them sent to me.  Of course if it does continue to creep up, we may have to reconsider the lithium or I would prefer that she see a nephrologist first.  Usually at a creatinine of this level, there is no concern. Continue lithium CR 450 mg, 1 p.o. twice daily. Continue Xanax 0.5 mg, 1/2-1 p.o. twice daily as needed. Continue Lexapro 20 mg, 1 p.o. every morning. Continue modafinil 200 mg, 1/4  every morning as needed. Recommend counseling. Return in 3 months.  Melony Overly, PA-C

## 2020-02-26 NOTE — Telephone Encounter (Signed)
This is FUTURE refills, filled yesterday Next apt 04/2020

## 2020-03-01 ENCOUNTER — Other Ambulatory Visit: Payer: Self-pay | Admitting: Internal Medicine

## 2020-03-11 ENCOUNTER — Telehealth (INDEPENDENT_AMBULATORY_CARE_PROVIDER_SITE_OTHER): Payer: 59 | Admitting: Family Medicine

## 2020-03-11 ENCOUNTER — Encounter: Payer: Self-pay | Admitting: Family Medicine

## 2020-03-11 DIAGNOSIS — R059 Cough, unspecified: Secondary | ICD-10-CM

## 2020-03-11 DIAGNOSIS — R05 Cough: Secondary | ICD-10-CM

## 2020-03-11 NOTE — Progress Notes (Signed)
Patient ID: Lisa Lee, female   DOB: 05-16-1961, 59 y.o.   MRN: [id number]   This visit type was conducted due to national recommendations for restrictions regarding the COVID-19 pandemic in an effort to limit this patient's exposure and mitigate transmission in our community.   Virtual Visit via Telephone Note  I connected with [name] on 03/11/20 at  7:00 AM EDT by telephone and verified that I am speaking with the correct person using two identifiers.   I discussed the limitations, risks, security and privacy concerns of performing an evaluation and management service by telephone and the availability of in person appointments. I also discussed with the patient that there may be a patient responsible charge related to this service. The patient expressed understanding and agreed to proceed.  Location patient: home Location provider: work or home office Participants present for the call: patient, provider Patient did not have a visit in the prior 7 days to address this/these issue(s).   History of Present Illness:  [name] called with cough which started about 5 weeks ago.  Cough is dry and nonproductive.  She has not had any fever.  She gives history that she got her Covid vaccine back in February and she did test positive for Covid back in April.  She had monoclonal antibody infusion.  She seemed to recover from that.  She really does not feel sick at this time and has just a very mild cough but she has a grandchild expected in 10 days and was concerned regarding that.  She has had Tdap.  She has been taking Delsym for cough and that seems to suppress her cough fairly well.  She had history of chronic cough years ago and went to pulmonology and eventually ended up on gabapentin which seemed to help.  She has had tendency to chronic cough in the past.  Never smoked.  Denies any recent appetite change, weight loss, hemoptysis, dyspnea, nasal congestion.  She has not tolerated  Tessalon in the past.  No active reflux symptoms.  No postnasal drip symptoms.  No ACE inhibitor use  Past Medical History:  Diagnosis Date   Acid reflux    Allergic rhinitis    Asthma    Depression    Diarrhea 08/09/2011   Poss ibs  Vs other  Related to stress  consdier other eval if needed. Had colonoscoopy per dr Matthias Hughs    Hemorrhoids    High cholesterol    HPV in female 1984   Hypothyroidism    Medication side effect 11/01/2011   tussionex   distrubed sleep    Thyroid disease    Past Surgical History:  Procedure Laterality Date   CERVICAL CONIZATION W/BX N/A 07/21/2015   Procedure: CONIZATION CERVIX WITH BIOPSY;  Surgeon: Kirkland Hun, MD;  Location: [facility];  Service: Gynecology;  Laterality: N/A;   CERVICAL CONIZATION W/BX N/A 09/25/2018   Procedure: CONIZATION CERVIX WITH BIOPSY;  Surgeon: Adolphus Birchwood, MD;  Location[facility];  Service: Gynecology;  Laterality: N/A;   CERVICAL CRYOTHERAPY  1984   COLPOSCOPY  08/26/13   DILATION AND CURETTAGE, DIAGNOSTIC / THERAPEUTIC  1994   Blighted Ovum   HYSTEROSCOPY     uterine polypectomy Jan 7th   HYSTEROSCOPY WITH RESECTOSCOPE  08/04/2009   Removed polyp & IUD   LYMPH NODE BIOPSY     TONSILLECTOMY      reports that she has never smoked. She has never used smokeless tobacco. She reports current alcohol use.  She reports that she does not use drugs. family history includes Arthritis in her mother; Colon cancer in her father; Heart disease in her brother, father, mother, and sister; Heart disease (age of onset: 5) in her sister; Parkinson's disease in her father; Pulmonary embolism in her daughter; Thyroid disease in her daughter and sister. Allergies  Allergen Reactions   Penicillins Hives and Shortness Of Breath    Has patient had a PCN reaction causing immediate rash, facial/tongue/throat swelling, SOB or lightheadedness with hypotension: Yes Has patient had a PCN reaction causing severe rash  involving mucus membranes or skin necrosis: No Has patient had a PCN reaction that required hospitalization No Has patient had a PCN reaction occurring within the last 10 years: No If all of the above answers are "NO", then may proceed with Cephalosporin use.    Pravastatin Other (See Comments)    Cause muscle aches and cramps   Rosuvastatin Other (See Comments)    Caused pain in hands, shoulders, back, and indigestion      Observations/Objective: Patient sounds cheerful and well on the phone. I do not appreciate any SOB. Speech and thought processing are grossly intact. Patient reported vitals:  Assessment and Plan:  Persistent cough.  Suspect post viral cough.  She denies any other symptoms.  She does not any red flag such as weight loss, dyspnea, fever, hemoptysis.  She does have Covid history as above and has had Covid vaccine and immunoglobulin infusion back in April  -We recommend that she go ahead and get Covid tested to be safe but doubt this is acute Covid -She will consider going back on low-dose gabapentin which she has still at home which is worked for her in the past -Follow-up with primary if cough not resolving in the next couple weeks and sooner as needed  Follow Up Instructions:  -As above   [id number] [id number] [id number] 11-20 [id number] 21-30 I did not refer this patient for an OV in the next 24 hours for this/these issue(s).  I discussed the assessment and treatment plan with the patient. The patient was provided an opportunity to ask questions and all were answered. The patient agreed with the plan and demonstrated an understanding of the instructions.   The patient was advised to call back or seek an in-person evaluation if the symptoms worsen or if the condition fails to improve as anticipated.  I provided 17 minutes of non-face-to-face time during this encounter.   Evelena Peat, MD

## 2020-03-28 ENCOUNTER — Other Ambulatory Visit: Payer: Self-pay

## 2020-03-29 ENCOUNTER — Encounter: Payer: Self-pay | Admitting: Internal Medicine

## 2020-03-29 ENCOUNTER — Ambulatory Visit (INDEPENDENT_AMBULATORY_CARE_PROVIDER_SITE_OTHER): Payer: 59 | Admitting: Internal Medicine

## 2020-03-29 VITALS — BP 108/64 | HR 62 | Temp 98.5°F | Ht 60.5 in | Wt 136.4 lb

## 2020-03-29 DIAGNOSIS — E039 Hypothyroidism, unspecified: Secondary | ICD-10-CM | POA: Diagnosis not present

## 2020-03-29 DIAGNOSIS — Z0000 Encounter for general adult medical examination without abnormal findings: Secondary | ICD-10-CM | POA: Diagnosis not present

## 2020-03-29 DIAGNOSIS — R7301 Impaired fasting glucose: Secondary | ICD-10-CM

## 2020-03-29 DIAGNOSIS — H9190 Unspecified hearing loss, unspecified ear: Secondary | ICD-10-CM

## 2020-03-29 DIAGNOSIS — R251 Tremor, unspecified: Secondary | ICD-10-CM

## 2020-03-29 DIAGNOSIS — Z79899 Other long term (current) drug therapy: Secondary | ICD-10-CM | POA: Diagnosis not present

## 2020-03-29 DIAGNOSIS — E785 Hyperlipidemia, unspecified: Secondary | ICD-10-CM

## 2020-03-29 DIAGNOSIS — R197 Diarrhea, unspecified: Secondary | ICD-10-CM

## 2020-03-29 MED ORDER — ALBUTEROL SULFATE HFA 108 (90 BASE) MCG/ACT IN AERS: 2.0000 | INHALATION_SPRAY | Freq: Four times a day (QID) | RESPIRATORY_TRACT | 1 refills | Status: DC | PRN

## 2020-03-29 NOTE — Patient Instructions (Signed)
Will notify you  of labs when available.   consdier  Neuro eval for the tremor if   persistent or progressive   Dr Tat     Health Maintenance, Female Adopting a healthy lifestyle and getting preventive care are important in promoting health and wellness. Ask your health care provider about:  The right schedule for you to have regular tests and exams.  Things you can do on your own to prevent diseases and keep yourself healthy. What should I know about diet, weight, and exercise? Eat a healthy diet   Eat a diet that includes plenty of vegetables, fruits, low-fat dairy products, and lean protein.  Do not eat a lot of foods that are high in solid fats, added sugars, or sodium. Maintain a healthy weight Body mass index (BMI) is used to identify weight problems. It estimates body fat based on height and weight. Your health care provider can help determine your BMI and help you achieve or maintain a healthy weight. Get regular exercise Get regular exercise. This is one of the most important things you can do for your health. Most adults should:  Exercise for at least 150 minutes each week. The exercise should increase your heart rate and make you sweat (moderate-intensity exercise).  Do strengthening exercises at least twice a week. This is in addition to the moderate-intensity exercise.  Spend less time sitting. Even light physical activity can be beneficial. Watch cholesterol and blood lipids Have your blood tested for lipids and cholesterol at 60 years of age, then have this test every 5 years. Have your cholesterol levels checked more often if:  Your lipid or cholesterol levels are high.  You are older than 60 years of age.  You are at high risk for heart disease. What should I know about cancer screening? Depending on your health history and family history, you may need to have cancer screening at various ages. This may include screening for:  Breast cancer.  Cervical  cancer.  Colorectal cancer.  Skin cancer.  Lung cancer. What should I know about heart disease, diabetes, and high blood pressure? Blood pressure and heart disease  High blood pressure causes heart disease and increases the risk of stroke. This is more likely to develop in people who have high blood pressure readings, are of African descent, or are overweight.  Have your blood pressure checked: ? Every 3-5 years if you are 23-76 years of age. ? Every year if you are 7 years old or older. Diabetes Have regular diabetes screenings. This checks your fasting blood sugar level. Have the screening done:  Once every three years after age 71 if you are at a normal weight and have a low risk for diabetes.  More often and at a younger age if you are overweight or have a high risk for diabetes. What should I know about preventing infection? Hepatitis B If you have a higher risk for hepatitis B, you should be screened for this virus. Talk with your health care provider to find out if you are at risk for hepatitis B infection. Hepatitis C Testing is recommended for:  Everyone born from [birth year] through 1966.  Anyone with known risk factors for hepatitis C. Sexually transmitted infections (STIs)  Get screened for STIs, including gonorrhea and chlamydia, if: ? You are sexually active and are younger than 60 years of age. ? You are older than 60 years of age and your health care provider tells you that you are at risk for  this type of infection. ? Your sexual activity has changed since you were last screened, and you are at increased risk for chlamydia or gonorrhea. Ask your health care provider if you are at risk.  Ask your health care provider about whether you are at high risk for HIV. Your health care provider may recommend a prescription medicine to help prevent HIV infection. If you choose to take medicine to prevent HIV, you should first get tested for HIV. You should then be tested every 3  months for as long as you are taking the medicine. Pregnancy  If you are about to stop having your period (premenopausal) and you may become pregnant, seek counseling before you get pregnant.  Take 400 to 800 micrograms (mcg) of folic acid every day if you become pregnant.  Ask for birth control (contraception) if you want to prevent pregnancy. Osteoporosis and menopause Osteoporosis is a disease in which the bones lose minerals and strength with aging. This can result in bone fractures. If you are 29 years old or older, or if you are at risk for osteoporosis and fractures, ask your health care provider if you should:  Be screened for bone loss.  Take a calcium or vitamin D supplement to lower your risk of fractures.  Be given hormone replacement therapy (HRT) to treat symptoms of menopause. Follow these instructions at home: Lifestyle  Do not use any products that contain nicotine or tobacco, such as cigarettes, e-cigarettes, and chewing tobacco. If you need help quitting, ask your health care provider.  Do not use street drugs.  Do not share needles.  Ask your health care provider for help if you need support or information about quitting drugs. Alcohol use  Do not drink alcohol if: ? Your health care provider tells you not to drink. ? You are pregnant, may be pregnant, or are planning to become pregnant.  If you drink alcohol: ? Limit how much you use to 0-1 drink a day. ? Limit intake if you are breastfeeding.  Be aware of how much alcohol is in your drink. In the [place] one drink equals one 12 oz bottle of beer (355 mL), one 5 oz glass of wine (148 mL), or one 1 oz glass of hard liquor (44 mL). General instructions  Schedule regular health, dental, and eye exams.  Stay current with your vaccines.  Tell your health care provider if: ? You often feel depressed. ? You have ever been abused or do not feel safe at home. Summary  Adopting a healthy lifestyle and getting  preventive care are important in promoting health and wellness.  Follow your health care provider's instructions about healthy diet, exercising, and getting tested or screened for diseases.  Follow your health care provider's instructions on monitoring your cholesterol and blood pressure. This information is not intended to replace advice given to you by your health care provider. Make sure you discuss any questions you have with your health care provider. Document Revised: 07/09/2018 Document Reviewed: 07/09/2018   Tremor A tremor is trembling or shaking that you cannot control. Most tremors affect the hands or arms. Tremors can also affect the head, vocal cords, face, and other parts of the body. There are many types of tremors. Common types include:  Essential tremor. These usually occur in people older than 40. It may run in families and can happen in otherwise healthy people.  Resting tremor. These occur when the muscles are at rest, such as when your hands are resting  in your lap. People with Parkinson's disease often have resting tremors.  Postural tremor. These occur when you try to hold a pose, such as keeping your hands outstretched.  Kinetic tremor. These occur during purposeful movement, such as trying to touch a finger to your nose.  Task-specific tremor. These may occur when you perform certain tasks such as writing, speaking, or standing.  Psychogenic tremor. These dramatically lessen or disappear when you are distracted. They can happen in people of all ages. Some types of tremors have no known cause. Tremors can also be a symptom of nervous system problems (neurological disorders) that may occur with aging. Some tremors go away with treatment, while others do not. Follow these instructions at home: Lifestyle      Limit alcohol intake to no more than 1 drink a day for nonpregnant women and 2 drinks a day for men. One drink equals 12 oz of beer, 5 oz of wine, or 1 oz of  hard liquor.  Do not use any products that contain nicotine or tobacco, such as cigarettes and e-cigarettes. If you need help quitting, ask your health care provider.  Avoid extreme heat and extreme cold.  Limit your caffeine intake, as told by your health care provider.  Try to get 8 hours of sleep each night.  Find ways to manage your stress, such as meditation or yoga. General instructions  Take over-the-counter and prescription medicines only as told by your health care provider.  Keep all follow-up visits as told by your health care provider. This is important. Contact a health care provider if you:  Develop a tremor after starting a new medicine.  Have a tremor along with other symptoms such as: ? Numbness. ? Tingling. ? Pain. ? Weakness.  Notice that your tremor gets worse.  Notice that your tremor interferes with your day-to-day life. Summary  A tremor is trembling or shaking that you cannot control.  Most tremors affect the hands or arms.  Some types of tremors have no known cause. Others may be a symptom of nervous system problems (neurological disorders).  Make sure you discuss any tremors you have with your health care provider. This information is not intended to replace advice given to you by your health care provider. Make sure you discuss any questions you have with your health care provider. Document Revised: 06/28/2017 Document Reviewed: 05/16/2017 Elsevier Patient Education  2020 [employer].  Elsevier Patient Education  [employer].

## 2020-03-29 NOTE — Progress Notes (Signed)
Chief Complaint  Patient presents with  . Annual Exam    Doing okay  . Tremors  . Medication Management    HPI: Patient  Lisa Lee  59 y.o. comes in today for Preventive Health Care visit     And med checks   Tremor  Arm r more than left   Echinacea.    recent inc lithium.but   Level was ok .  Father may have had  But no parkinson's   Is bothersome to hold cup no falling  But may effect her work  Had labs in early summer   Creat in 1 range calcium 10.5  CV   recent  Losartan for  Elevated BP  160 .  And now controlled   Cough   Gone   Albuterol helped  during cough asks for refill if needed  No chronic resp sx  Ha hx of vaccine and  Break through covid see notes  Hearing seems down some  Gets diarrhea at night at times  Takes immodium  Has had eval in   past  Health Maintenance  Topic Date Due  . COVID-19 Vaccine (1) Never done  . MAMMOGRAM  05/30/2018  . INFLUENZA VACCINE  02/28/2020  . TETANUS/TDAP  02/21/2021  . PAP SMEAR-Modifier  11/17/2022  . COLONOSCOPY  07/05/2023  . Hepatitis C Screening  Completed  . HIV Screening  Completed   Health Maintenance Review LIFESTYLE:  Exercise:   Low x   Work  Tobacco/ETS: no Alcohol:   Rare  Sugar beverages: Sleep:  7 hours  Drug use: no HH of  2 cat  Work: ave  36 hours   Just had a grandchild doing ok  Has gyne  ROS:  GEN/ HEENT: No fever, significant weight changes sweats headaches vision problems hearing changes, CV/ PULM; No chest pain shortness of breath , syncope,edema  change in exercise tolerance. GI /GU: No adominal pain, vomiting, change in bowel habits. No blood in the stool. No significant GU symptoms. SKIN/HEME: ,no acute skin rashes suspicious lesions or bleeding. No lymphadenopathy, nodules, masses.  NEURO/ PSYCH:  No neurologic signs such as weakness numbness. Under rx for depression anxiety. IMM/ Allergy: No unusual infections.  Allergy .   REST of 12 system review negative except as per  HPI   Past Medical History:  Diagnosis Date  . Acid reflux   . Allergic rhinitis   . Asthma   . Depression   . Diarrhea 08/09/2011   Poss ibs  Vs other  Related to stress  consdier other eval if needed. Had colonoscoopy per dr Matthias Hughs   . Hemorrhoids   . High cholesterol   . 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 Hypothyroidism   . Medication side effect 11/01/2011   tussionex   distrubed sleep   . Thyroid disease     Past Surgical History:  Procedure Laterality Date  . CERVICAL CONIZATION W/BX N/A 07/21/2015   Procedure: CONIZATION CERVIX WITH BIOPSY;  Surgeon: Kirkland Hun, MD;  Location: [facility];  Service: Gynecology;  Laterality: N/A;  . CERVICAL CONIZATION W/BX N/A 09/25/2018   Procedure: CONIZATION CERVIX WITH BIOPSY;  Surgeon: Adolphus Birchwood, MD;  Location: [facility];  Service: Gynecology;  Laterality: N/A;  . CERVICAL CRYOTHERAPY  1984  . COLPOSCOPY  08/26/13  . DILATION AND CURETTAGE, DIAGNOSTIC / THERAPEUTIC  1994   Blighted Ovum  . HYSTEROSCOPY     uterine polypectomy Jan 7th  . HYSTEROSCOPY WITH RESECTOSCOPE  08/04/2009   Removed polyp & IUD  . LYMPH NODE BIOPSY    . TONSILLECTOMY      Family History  Problem Relation Age of Onset  . Heart disease Sister 6       cabg stent  . Arthritis Mother   . Heart disease Mother   . Heart disease Father   . Colon cancer Father   . Parkinson's disease Father   . Pulmonary embolism Daughter   . Heart disease Sister   . Thyroid disease Sister   . Heart disease Brother   . Thyroid disease Daughter   . Rectal cancer Neg Hx     Social History   Socioeconomic History  . Marital status: Married    Spouse name: Not on file  . Number of children: Not on file  . Years of education: Not on file  . Highest education level: Not on file  Occupational History  . Not on file  Tobacco Use  . Smoking status: Never Smoker  . Smokeless tobacco: Never Used  Vaping Use  . Vaping Use: Never used  Substance and Sexual Activity   . Alcohol use: Yes    Alcohol/week: 0.0 standard drinks    Comment: rare  . Drug use: No  . Sexual activity: Yes    Birth control/protection: None  Other Topics Concern  . Not on file  Social History Narrative    [employer]. 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 Difficulty of Paying Living Expenses: Not on file  Food Insecurity:   . Worried About Programme researcher, broadcasting/film/video in the Last Year: Not on file  . Ran Out of Food in the Last Year: Not on file  Transportation Needs:   . Lack of Transportation (Medical): Not on file  . Lack of Transportation (Non-Medical): Not on file  Physical Activity:   . Days of Exercise per Week: Not on file  . Minutes of Exercise per Session: Not on file  Stress:   . Feeling of Stress : Not on file  Social Connections:   . Frequency of Communication with Friends and Family: Not on file  . Frequency of Social Gatherings with Friends and Family: Not on file  . Attends Religious Services: Not on file  . Active Member of Clubs or Organizations: Not on file  . Attends Banker Meetings: Not on file  . Marital Status: Not on file    Outpatient Medications Prior to Visit  Medication Sig Dispense Refill  . ALPRAZolam (XANAX) 0.5 MG tablet Take 1/2 to 1 tablet (0.25-0.5 mg total) by mouth 2 times daily as needed for anxiety. 30 tablet 1  . aspirin EC 81 MG tablet Take 1 tablet (81 mg total) by mouth daily.    Marland Kitchen atorvastatin (LIPITOR) 10 MG tablet Take 1 tablet (10 mg total) by mouth daily. 90 tablet 3  . Cholecalciferol (VITAMIN D) 2000 units tablet Take 2,000 Units by mouth daily.    Marland Kitchen doxylamine, Sleep, (UNISOM) 25 MG tablet Take 25 mg by mouth at bedtime as needed.    Marland Kitchen escitalopram (LEXAPRO) 20 MG tablet Take 1 tablet  (20 mg total) by mouth daily. 90  tablet 1  . famotidine (PEPCID) 20 MG tablet TAKE 1 TABLET AT BEDTIME 90 tablet 1  . fenofibrate 160 MG tablet TAKE 1 TABLET BY MOUTH EVERY DAY. 90 tablet 3  . levothyroxine (SYNTHROID) 75 MCG tablet Take 1 tablet (75 mcg total) by mouth daily. Please schedule yearly visit with labs for further refills. [phone] 90 tablet 0  . lithium carbonate (ESKALITH) 450 MG CR tablet Take 1 tablet (450 mg total) by mouth 2 (two) times daily. 180 tablet 0  . loperamide (IMODIUM A-D) 2 MG tablet Take 1 mg by mouth daily.     Marland Kitchen. losartan (COZAAR) 50 MG tablet Take 1 tablet (50 mg total) by mouth daily. 90 tablet 2  . modafinil (PROVIGIL) 200 MG tablet Take 0.5 tablets (100 mg total) by mouth daily. (Patient taking differently: Take 50 mg by mouth daily. ) 30 tablet 5  . Multiple Vitamins-Minerals (MULTIVITAMIN WITH MINERALS) tablet Take 1 tablet by mouth daily.    . pantoprazole (PROTONIX) 40 MG tablet Take 1 tablet (40 mg total) by mouth daily. 90 tablet 0  . valACYclovir (VALTREX) 500 MG tablet TAKE 1 TABLET DAILY FOR SUPPRESSION AND 1 TABLET TWICE A DAY FOR 3 DAYS FOR OUTBREAK 30 tablet 13  . albuterol (PROAIR HFA) 108 (90 Base) MCG/ACT inhaler Inhale 2 puffs into the lungs every 6 (six) hours as needed for wheezing. 3 Inhaler 0  . dextromethorphan-guaiFENesin (MUCINEX DM) 30-600 MG 12hr tablet Take 1 tablet by mouth 2 (two) times daily as needed for cough.  (Patient not taking: Reported on 03/29/2020)    . YUVAFEM 10 MCG TABS vaginal tablet INSERT 1 TABLET VAGINALLY TWICE A WEEK (Patient not taking: Reported on 11/17/2019) 8 tablet 12   No facility-administered medications prior to visit.     EXAM:  BP 108/64   Pulse 62   Temp 98.5 F (36.9 C) (Oral)   Ht 5' 0.5" (1.537 m)   Wt 136 lb 6.4 oz (61.9 kg)   LMP 12/09/2013   SpO2 98%   BMI 26.20 kg/m   Body mass index is 26.2 kg/m. Wt Readings from Last 3 Encounters:  03/29/20 136 lb 6.4 oz (61.9 kg)  09/25/18  139 lb 14.4 oz (63.5 kg)  12/01/14 139 lb (63 kg)    Physical Exam: Vital signs reviewed VQQ:[id number]:This is a well-developed well-nourished alert cooperative    who appearsr stated age in no acute distress.  HEENT: normocephalic atraumatic , Eyes: PERRL EOM's full, conjunctiva clear, Nares: paten,t no deformity discharge or tenderness., Ears: no deformity EAC's clear TMs with normal landmarks. Mouth masked  NECK: supple without masses, thyromegaly or bruits. CHEST/PULM:  Clear to auscultation and percussion breath sounds equal no wheeze , rales or rhonchi. No chest wall deformities or tenderness.  CV: PMI is nondisplaced, S1 S2 no gallops, murmurs, rubs. Peripheral pulses are full without delay.No JVD .  ABDOMEN: Bowel sounds normal nontender  No guard or rebound, no hepato splenomegal no CVA tenderness.  No hernia. Extremtities:  No clubbing cyanosis or edema, no acute joint swelling or redness no focal atrophy NEURO:  Oriented x3, cranial nerves 3-12 appear to be intact, no obvious focal weakness,gait within normal limits no abnormal reflexes or asymmetrical fine tremor   Right arm more than left  ? cogwheeling    ue  SKIN: No acute rashes normal turgor, color, no bruising or petechiae. PSYCH: Oriented, good eye contact, no obvious depression anxiety, cognition and judgment appear normal. LN: no cervical axillary inguinal adenopathy  Lab Results  Component Value Date   WBC 9.6 06/03/2019   HGB 12.8 11/17/2019   HCT 37.8 06/03/2019   PLT 368 06/03/2019   GLUCOSE 98 06/03/2019   CHOL 167 06/03/2019   TRIG 77 06/03/2019   HDL 53 06/03/2019   LDLDIRECT 101.0 10/30/2016   LDLCALC 99 06/03/2019   ALT 30 06/03/2019   AST 25 06/03/2019   NA 139 06/03/2019   K 4.6 06/03/2019   CL 105 06/03/2019   CREATININE 1.04 (H) 06/03/2019   BUN 28 (H) 06/03/2019   CO2 22 06/03/2019   TSH 1.53 06/10/2019   HGBA1C 5.4 02/13/2019    BP Readings from Last 3 Encounters:  03/29/20 108/64  11/02/19  115/61  09/25/18 116/63    Lab plan reviewed with patient   ASSESSMENT AND PLAN:  Discussed the following assessment and plan:    ICD-10-CM   1. Visit for preventive health examination  Z00.00 CBC with Differential/Platelet    Hemoglobin A1c    Lipid panel    TSH    T4, free    T3, free    Comprehensive metabolic panel    PTH, intact and calcium    Celiac Disease Comprehensive Panel with Reflexes    Celiac Disease Comprehensive Panel with Reflexes    PTH, intact and calcium    Comprehensive metabolic panel    T3, free    T4, free    TSH    Lipid panel    Hemoglobin A1c    CBC with Differential/Platelet  2. Tremor  R25.1 CBC with Differential/Platelet    Hemoglobin A1c    Lipid panel    TSH    T4, free    T3, free    Comprehensive metabolic panel    PTH, intact and calcium    Celiac Disease Comprehensive Panel with Reflexes    Celiac Disease Comprehensive Panel with Reflexes    PTH, intact and calcium    Comprehensive metabolic panel    T3, free    T4, free    TSH    Lipid panel    Hemoglobin A1c    CBC with Differential/Platelet  3. Medication management  Z79.899 CBC with Differential/Platelet    Hemoglobin A1c    Lipid panel    TSH    T4, free    T3, free    Comprehensive metabolic panel    PTH, intact and calcium    Celiac Disease Comprehensive Panel with Reflexes    Celiac Disease Comprehensive Panel with Reflexes    PTH, intact and calcium    Comprehensive metabolic panel    T3, free    T4, free    TSH    Lipid panel    Hemoglobin A1c    CBC with Differential/Platelet  4. Hypothyroidism, unspecified type  E03.9 CBC with Differential/Platelet    Hemoglobin A1c    Lipid panel    TSH    T4, free    T3, free    Comprehensive metabolic panel    PTH, intact and calcium    Celiac Disease Comprehensive Panel with Reflexes    Celiac Disease Comprehensive Panel with Reflexes    PTH, intact and calcium    Comprehensive metabolic panel    T3, free     T4, free    TSH    Lipid panel    Hemoglobin A1c    CBC with Differential/Platelet  5. Fasting hyperglycemia  R73.01 CBC with Differential/Platelet    Hemoglobin A1c  Lipid panel    TSH    T4, free    T3, free    Comprehensive metabolic panel    PTH, intact and calcium    Celiac Disease Comprehensive Panel with Reflexes    Celiac Disease Comprehensive Panel with Reflexes    PTH, intact and calcium    Comprehensive metabolic panel    T3, free    T4, free    TSH    Lipid panel    Hemoglobin A1c    CBC with Differential/Platelet  6. Hyperlipidemia, unspecified hyperlipidemia type  E78.5 CBC with Differential/Platelet    Hemoglobin A1c    Lipid panel    TSH    T4, free    T3, free    Comprehensive metabolic panel    PTH, intact and calcium    Celiac Disease Comprehensive Panel with Reflexes    Celiac Disease Comprehensive Panel with Reflexes    PTH, intact and calcium    Comprehensive metabolic panel    T3, free    T4, free    TSH    Lipid panel    Hemoglobin A1c    CBC with Differential/Platelet  7. Diarrhea, unspecified type  R19.7 CBC with Differential/Platelet    Hemoglobin A1c    Lipid panel    TSH    T4, free    T3, free    Comprehensive metabolic panel    PTH, intact and calcium    Celiac Disease Comprehensive Panel with Reflexes    Celiac Disease Comprehensive Panel with Reflexes    PTH, intact and calcium    Comprehensive metabolic panel    T3, free    T4, free    TSH    Lipid panel    Hemoglobin A1c    CBC with Differential/Platelet  8. Hearing difficulty, unspecified laterality  H91.90   9. Lithium use  Z79.899   hearing  Get eval  [facility] ok  Tremor?if could be from  meds other  Some cogwheeling tendency follow  And refer if  persistent or progressive  Med eval check thyroid Refill rescue inhaler  rarely used   Return for depending on results yearly .  Patient Care Team: Edin Skarda, Neta Mends, MD as PCP - Veto Kemps, MD  (Ophthalmology) Bernette Redbird, MD as Consulting Physician (Gastroenterology) [name], MD as Consulting Physician (Pulmonary Disease) Kae Heller, MD as Consulting Physician (Cardiology) Huel Cote, MD as Consulting Physician (Obstetrics and Gynecology) Patient Instructions  Will notify you  of labs when available.   consdier  Neuro eval for the tremor if   persistent or progressive   Dr Tat     Health Maintenance, Female Adopting a healthy lifestyle and getting preventive care are important in promoting health and wellness. Ask your health care provider about:  The right schedule for you to have regular tests and exams.  Things you can do on your own to prevent diseases and keep yourself healthy. What should I know about diet, weight, and exercise? Eat a healthy diet   Eat a diet that includes plenty of vegetables, fruits, low-fat dairy products, and lean protein.  Do not eat a lot of foods that are high in solid fats, added sugars, or sodium. Maintain a healthy weight Body mass index (BMI) is used to identify weight problems. It estimates body fat based on height and weight. Your health care provider can help determine your BMI and help you achieve or maintain a healthy weight. Get regular exercise  Get regular exercise. This is one of the most important things you can do for your health. Most adults should:  Exercise for at least 150 minutes each week. The exercise should increase your heart rate and make you sweat (moderate-intensity exercise).  Do strengthening exercises at least twice a week. This is in addition to the moderate-intensity exercise.  Spend less time sitting. Even light physical activity can be beneficial. Watch cholesterol and blood lipids Have your blood tested for lipids and cholesterol at 60 years of age, then have this test every 5 years. Have your cholesterol levels checked more often if:  Your lipid or cholesterol  levels are high.  You are older than 60 years of age.  You are at high risk for heart disease. What should I know about cancer screening? Depending on your health history and family history, you may need to have cancer screening at various ages. This may include screening for:  Breast cancer.  Cervical cancer.  Colorectal cancer.  Skin cancer.  Lung cancer. What should I know about heart disease, diabetes, and high blood pressure? Blood pressure and heart disease  High blood pressure causes heart disease and increases the risk of stroke. This is more likely to develop in people who have high blood pressure readings, are of African descent, or are overweight.  Have your blood pressure checked: ? Every 3-5 years if you are 70-17 years of age. ? Every year if you are 81 years old or older. Diabetes Have regular diabetes screenings. This checks your fasting blood sugar level. Have the screening done:  Once every three years after age 14 if you are at a normal weight and have a low risk for diabetes.  More often and at a younger age if you are overweight or have a high risk for diabetes. What should I know about preventing infection? Hepatitis B If you have a higher risk for hepatitis B, you should be screened for this virus. Talk with your health care provider to find out if you are at risk for hepatitis B infection. Hepatitis C Testing is recommended for:  Everyone born from [birth year] through 1966.  Anyone with known risk factors for hepatitis C. Sexually transmitted infections (STIs)  Get screened for STIs, including gonorrhea and chlamydia, if: ? You are sexually active and are younger than 60 years of age. ? You are older than 60 years of age and your health care provider tells you that you are at risk for this type of infection. ? Your sexual activity has changed since you were last screened, and you are at increased risk for chlamydia or gonorrhea. Ask your health care  provider if you are at risk.  Ask your health care provider about whether you are at high risk for HIV. Your health care provider may recommend a prescription medicine to help prevent HIV infection. If you choose to take medicine to prevent HIV, you should first get tested for HIV. You should then be tested every 3 months for as long as you are taking the medicine. Pregnancy  If you are about to stop having your period (premenopausal) and you may become pregnant, seek counseling before you get pregnant.  Take 400 to 800 micrograms (mcg) of folic acid every day if you become pregnant.  Ask for birth control (contraception) if you want to prevent pregnancy. Osteoporosis and menopause Osteoporosis is a disease in which the bones lose minerals and strength with aging. This can result in bone fractures. If you  are 44 years old or older, or if you are at risk for osteoporosis and fractures, ask your health care provider if you should:  Be screened for bone loss.  Take a calcium or vitamin D supplement to lower your risk of fractures.  Be given hormone replacement therapy (HRT) to treat symptoms of menopause. Follow these instructions at home: Lifestyle  Do not use any products that contain nicotine or tobacco, such as cigarettes, e-cigarettes, and chewing tobacco. If you need help quitting, ask your health care provider.  Do not use street drugs.  Do not share needles.  Ask your health care provider for help if you need support or information about quitting drugs. Alcohol use  Do not drink alcohol if: ? Your health care provider tells you not to drink. ? You are pregnant, may be pregnant, or are planning to become pregnant.  If you drink alcohol: ? Limit how much you use to 0-1 drink a day. ? Limit intake if you are breastfeeding.  Be aware of how much alcohol is in your drink. In the [place] one drink equals one 12 oz bottle of beer (355 mL), one 5 oz glass of wine (148 mL), or one 1  oz glass of hard liquor (44 mL). General instructions  Schedule regular health, dental, and eye exams.  Stay current with your vaccines.  Tell your health care provider if: ? You often feel depressed. ? You have ever been abused or do not feel safe at home. Summary  Adopting a healthy lifestyle and getting preventive care are important in promoting health and wellness.  Follow your health care provider's instructions about healthy diet, exercising, and getting tested or screened for diseases.  Follow your health care provider's instructions on monitoring your cholesterol and blood pressure. This information is not intended to replace advice given to you by your health care provider. Make sure you discuss any questions you have with your health care provider. Document Revised: 07/09/2018 Document Reviewed: 07/09/2018   Tremor A tremor is trembling or shaking that you cannot control. Most tremors affect the hands or arms. Tremors can also affect the head, vocal cords, face, and other parts of the body. There are many types of tremors. Common types include:  Essential tremor. These usually occur in people older than 40. It may run in families and can happen in otherwise healthy people.  Resting tremor. These occur when the muscles are at rest, such as when your hands are resting in your lap. People with Parkinson's disease often have resting tremors.  Postural tremor. These occur when you try to hold a pose, such as keeping your hands outstretched.  Kinetic tremor. These occur during purposeful movement, such as trying to touch a finger to your nose.  Task-specific tremor. These may occur when you perform certain tasks such as writing, speaking, or standing.  Psychogenic tremor. These dramatically lessen or disappear when you are distracted. They can happen in people of all ages. Some types of tremors have no known cause. Tremors can also be a symptom of nervous system problems  (neurological disorders) that may occur with aging. Some tremors go away with treatment, while others do not. Follow these instructions at home: Lifestyle      Limit alcohol intake to no more than 1 drink a day for nonpregnant women and 2 drinks a day for men. One drink equals 12 oz of beer, 5 oz of wine, or 1 oz of hard liquor.  Do not use any  products that contain nicotine or tobacco, such as cigarettes and e-cigarettes. If you need help quitting, ask your health care provider.  Avoid extreme heat and extreme cold.  Limit your caffeine intake, as told by your health care provider.  Try to get 8 hours of sleep each night.  Find ways to manage your stress, such as meditation or yoga. General instructions  Take over-the-counter and prescription medicines only as told by your health care provider.  Keep all follow-up visits as told by your health care provider. This is important. Contact a health care provider if you:  Develop a tremor after starting a new medicine.  Have a tremor along with other symptoms such as: ? Numbness. ? Tingling. ? Pain. ? Weakness.  Notice that your tremor gets worse.  Notice that your tremor interferes with your day-to-day life. Summary  A tremor is trembling or shaking that you cannot control.  Most tremors affect the hands or arms.  Some types of tremors have no known cause. Others may be a symptom of nervous system problems (neurological disorders).  Make sure you discuss any tremors you have with your health care provider. This information is not intended to replace advice given to you by your health care provider. Make sure you discuss any questions you have with your health care provider. Document Revised: 06/28/2017 Document Reviewed: 05/16/2017 Elsevier Patient Education  2020 [employer].  Elsevier Patient Education  2020 [employer].    [place] K. Jaelin Fackler M.D.

## 2020-03-30 LAB — CELIAC DISEASE COMPREHENSIVE PANEL WITH REFLEXES
(tTG) Ab, IgA: 1 U/mL
Immunoglobulin A: 96 mg/dL (ref 47–310)

## 2020-03-30 LAB — CBC WITH DIFFERENTIAL/PLATELET
Absolute Monocytes: 622 cells/uL (ref 200–950)
Basophils Absolute: 51 cells/uL (ref 0–200)
Basophils Relative: 0.5 %
Eosinophils Absolute: 459 cells/uL (ref 15–500)
Eosinophils Relative: 4.5 %
HCT: 36.6 % (ref 35.0–45.0)
Hemoglobin: 12.4 g/dL (ref 11.7–15.5)
Lymphs Abs: 2652 cells/uL (ref 850–3900)
MCH: 31.5 pg (ref 27.0–33.0)
MCHC: 33.9 g/dL (ref 32.0–36.0)
MCV: 92.9 fL (ref 80.0–100.0)
MPV: 11.2 fL (ref 7.5–12.5)
Monocytes Relative: 6.1 %
Neutro Abs: 6416 cells/uL (ref 1500–7800)
Neutrophils Relative %: 62.9 %
Platelets: 366 10*3/uL (ref 140–400)
RBC: 3.94 10*6/uL (ref 3.80–5.10)
RDW: 11.8 % (ref 11.0–15.0)
Total Lymphocyte: 26 %
WBC: 10.2 10*3/uL (ref 3.8–10.8)

## 2020-03-30 LAB — LIPID PANEL
Cholesterol: 174 mg/dL (ref <200)
HDL: 59 mg/dL (ref >=50)
LDL Cholesterol (Calc): 95 mg/dL (calc)
Non-HDL Cholesterol (Calc): 115 mg/dL (calc) (ref <130)
Total CHOL/HDL Ratio: 2.9 (calc) (ref <5.0)
Triglycerides: 104 mg/dL (ref <150)

## 2020-03-30 LAB — COMPREHENSIVE METABOLIC PANEL
AG Ratio: 2.2 (calc) (ref 1.0–2.5)
ALT: 23 U/L (ref 6–29)
AST: 20 U/L (ref 10–35)
Albumin: 4.8 g/dL (ref 3.6–5.1)
Alkaline phosphatase (APISO): 54 U/L (ref 37–153)
BUN/Creatinine Ratio: 24 (calc) — ABNORMAL HIGH (ref 6–22)
BUN: 25 mg/dL (ref 7–25)
CO2: 21 mmol/L (ref 20–32)
Calcium: 10.5 mg/dL — ABNORMAL HIGH (ref 8.6–10.4)
Chloride: 106 mmol/L (ref 98–110)
Creat: 1.04 mg/dL — ABNORMAL HIGH (ref 0.50–0.99)
Globulin: 2.2 g/dL (calc) (ref 1.9–3.7)
Glucose, Bld: 84 mg/dL (ref 65–99)
Potassium: 4.6 mmol/L (ref 3.5–5.3)
Sodium: 136 mmol/L (ref 135–146)
Total Bilirubin: 0.5 mg/dL (ref 0.2–1.2)
Total Protein: 7 g/dL (ref 6.1–8.1)

## 2020-03-30 LAB — HEMOGLOBIN A1C
Hgb A1c MFr Bld: 5.2 % of total Hgb (ref <5.7)
Mean Plasma Glucose: 103 (calc)
eAG (mmol/L): 5.7 (calc)

## 2020-03-30 LAB — T3, FREE: T3, Free: 2.9 pg/mL (ref 2.3–4.2)

## 2020-03-30 LAB — PTH, INTACT AND CALCIUM
Calcium: 10.5 mg/dL — ABNORMAL HIGH (ref 8.6–10.4)
PTH: 12 pg/mL — ABNORMAL LOW (ref 14–64)

## 2020-03-30 LAB — T4, FREE: Free T4: 1.6 ng/dL (ref 0.8–1.8)

## 2020-03-30 LAB — TSH: TSH: 3.31 mIU/L (ref 0.40–4.50)

## 2020-03-31 ENCOUNTER — Other Ambulatory Visit: Payer: Self-pay | Admitting: Internal Medicine

## 2020-04-06 ENCOUNTER — Other Ambulatory Visit: Payer: Self-pay

## 2020-04-06 NOTE — Progress Notes (Signed)
So labs ok except the borderline elevated calcium  and low pth     we need to make sure  your  vit d level is adequate to interpret this  finding  So plan continue taking  your vit D ?2000 iu per day (confirm) Then check non fasting well hydrated  bmp and intact pth and vit D level .  Dx elevated calcium

## 2020-04-07 ENCOUNTER — Other Ambulatory Visit: Payer: 59

## 2020-04-07 ENCOUNTER — Other Ambulatory Visit: Payer: Self-pay

## 2020-04-08 LAB — BASIC METABOLIC PANEL
BUN/Creatinine Ratio: 26 (calc) — ABNORMAL HIGH (ref 6–22)
BUN: 30 mg/dL — ABNORMAL HIGH (ref 7–25)
CO2: 25 mmol/L (ref 20–32)
Calcium: 10.3 mg/dL (ref 8.6–10.4)
Chloride: 103 mmol/L (ref 98–110)
Creat: 1.17 mg/dL — ABNORMAL HIGH (ref 0.50–0.99)
Glucose, Bld: 99 mg/dL (ref 65–99)
Potassium: 4.4 mmol/L (ref 3.5–5.3)
Sodium: 136 mmol/L (ref 135–146)

## 2020-04-08 LAB — PTH, INTACT AND CALCIUM
Calcium: 10.3 mg/dL (ref 8.6–10.4)
PTH: 22 pg/mL (ref 14–64)

## 2020-04-08 LAB — VITAMIN D 25 HYDROXY (VIT D DEFICIENCY, FRACTURES): Vit D, 25-Hydroxy: 33 ng/mL (ref 30–100)

## 2020-04-08 NOTE — Telephone Encounter (Signed)
certainly could be lithium but the calcium level is   Not that elevated and we have been seeing a lot of borderline elevated calcium levels in other patients  So could be lab issues   Your vit d and pth are ok at this time .( but vit d low normal)   And repeat calcium is ok  So I dont advise other intervention  And we can just  continue to check   Every 6 months or so

## 2020-04-08 NOTE — Progress Notes (Signed)
Better results   plan repeat bmp with gfr in 4-6 months to ensure stability

## 2020-04-13 ENCOUNTER — Other Ambulatory Visit: Payer: Self-pay

## 2020-04-13 DIAGNOSIS — R7989 Other specified abnormal findings of blood chemistry: Secondary | ICD-10-CM

## 2020-04-13 NOTE — Telephone Encounter (Signed)
Yes  Try 4000 iu per day equivalent Vit D3

## 2020-04-21 NOTE — Telephone Encounter (Signed)
Sorry not better  megan Please do a referral to neurology   Dr  TAT  Dx tremors progressing  ( last lithium level was ok in July)

## 2020-04-22 ENCOUNTER — Other Ambulatory Visit: Payer: Self-pay

## 2020-04-22 ENCOUNTER — Other Ambulatory Visit: Payer: Self-pay | Admitting: Cardiology

## 2020-04-22 DIAGNOSIS — G252 Other specified forms of tremor: Secondary | ICD-10-CM

## 2020-04-29 NOTE — Progress Notes (Signed)
Assessment/Plan:   1.  Tremor  -This is likely due to the lithium.  Studies are varied, but incidate that up to 2/3 of patients on lithium will have lithium-induced tremor, even if the patients are not toxic on the medication.  This is just a common side effect of patients on lithium.  I did not recommend that she stop or taper the lithium, but rather talk to her prescribing physician about this.  I did tell her that in my experience, lithium-induced tremor does not respond well to attempts at treating it with other medications.  She did note that her lithium dose was fairly significantly increased in July, which was around the time that her tremor increased.  She is going to talk to her PA who prescribes this and see if they can potentially find a middle-of-the-road dose.  I think that is reasonable.  -We talked about medical devices such as weighted gloves and [employer].  2.  Right hemifacial spasm  -Discussed nature and pathophysiology.  -Discussed with patient that we usually do not find causes for this, but we should do MRI brain, with attention to right cranial nerve VII.  -Discussed with patient that chronic hemifacial spasm can cause facial droop, and I already see some of that in her.  -Discussed with patient that treatment is generally Botox, but this would be very difficult in her given that she really only has facial spasm around the lips, and Botox right around the lip area is contraindicated, because it will cause more drooping and difficulty with eating and drooling.  We probably could put some in the zygomaticus area and see if that helps, but her options really are quite limited.  We opted to see what the MRI showed first.    Subjective:   Lisa Lee was seen in consultation in the movement disorder clinic at the request of Panosh, Neta Mends, MD.  The evaluation is for tremor.  Tremor started approximately 3-4 years ago and involves the bilateral UE, but it has gotten  worse.  Tremor is most noticeable in the AM.  She notes it when drinking from a glass or putting on makeup - mostly with fine motor movements.   There is a family hx of tremor in her father (head).    Affected by caffeine:  No. (1 tea/soda per day) Affected by alcohol:  No. (1-2 drinks per month) Affected by stress:  No. Affected by fatigue:  No. Spills soup if on spoon:  Yes.   Spills glass of liquid if full:  No. Affects ADL's (tying shoes, brushing teeth, etc):  Affects ability to put on makeup  Current/Previously tried tremor medications: on xanax  Current medications that may exacerbate tremor:  Lithium (15 years - dose increase in July); albuterol (uses rarely)  About halfway through the patient's examination, she mentions that her face has been twitching for about 2 years (she remembers it prior to the onset of Covid, as she remembers that she was happy when we started wearing masks because no one noticed it then).  Outside reports reviewed: historical medical records, office notes and referral letter/letters.  Allergies  Allergen Reactions  . Penicillins Hives, Shortness Of Breath and Swelling    Has patient had a PCN reaction causing immediate rash, facial/tongue/throat swelling, SOB or lightheadedness with hypotension: Yes Has patient had a PCN reaction causing severe rash involving mucus membranes or skin necrosis: No Has patient had a PCN reaction that required hospitalization No Has patient had  a PCN reaction occurring within the last 10 years: No If all of the above answers are "NO", then may proceed with Cephalosporin use.   . Pravastatin Other (See Comments)    Cause muscle aches and cramps  . Rosuvastatin Other (See Comments)    Caused pain in hands, shoulders, back, and indigestion    Current Outpatient Medications  Medication Instructions  . albuterol (PROAIR HFA) 108 (90 Base) MCG/ACT inhaler 2 puffs, Inhalation, Every 6 hours PRN  . ALPRAZolam (XANAX) 0.5 MG  tablet Take 1/2 to 1 tablet (0.25-0.5 mg total) by mouth 2 times daily as needed for anxiety.  Marland Kitchen aspirin EC 81 mg, Daily  . atorvastatin (LIPITOR) 10 mg, Oral, Daily  . dextromethorphan-guaiFENesin (MUCINEX DM) 30-600 MG 12hr tablet 1 tablet, Oral, 2 times daily PRN  . doxylamine (Sleep) (UNISOM) 25 mg, Oral, At bedtime PRN  . escitalopram (LEXAPRO) 20 mg, Oral, Daily  . famotidine (PEPCID) 20 MG tablet TAKE 1 TABLET AT BEDTIME  . fenofibrate 160 MG tablet TAKE 1 TABLET BY MOUTH EVERY DAY.  . Ginkgo Biloba 40 MG TABS 1 tablet, Oral, 2 times daily  . Imvexxy Maintenance Pack 10 mcg, Vaginal, 2 times weekly  . levothyroxine (SYNTHROID) 75 mcg, Oral, Daily, Please schedule yearly visit with labs for further refills. [phone]  . lithium carbonate (ESKALITH) 450 mg, Oral, 2 times daily  . loperamide (IMODIUM A-D) 2 mg, Oral, Daily  . losartan (COZAAR) 50 mg, Oral, Daily, Please keep upcoming appt in October with Dr. Delton See for future refills. Thank you  . modafinil (PROVIGIL) 100 mg, Oral, Daily  . Multiple Vitamins-Minerals (MULTIVITAMIN WITH MINERALS) tablet 1 tablet, Oral, Daily  . pantoprazole (PROTONIX) 40 mg, Oral, Daily  . valACYclovir (VALTREX) 500 MG tablet TAKE 1 TABLET DAILY FOR SUPPRESSION AND 1 TABLET TWICE A DAY FOR 3 DAYS FOR OUTBREAK  . Vitamin D 2,000 Units, Oral, 2 times daily     Objective:   VITALS:   Vitals:   05/03/20 0952  BP: 125/67  Pulse: 69  SpO2: 98%  Weight: 138 lb (62.6 kg)  Height: 5\' 1"  (1.549 m)   Gen:  Appears stated age and in NAD. HEENT:  Normocephalic, atraumatic. The mucous membranes are moist. The superficial temporal arteries are without ropiness or tenderness. Cardiovascular: Regular rate and rhythm. Lungs: Clear to auscultation bilaterally. Neck: There are no carotid bruits noted bilaterally.  NEUROLOGICAL:  Orientation:  The patient is alert and oriented x 3.   Cranial nerves: There is mild right facial droop. Extraocular muscles are  intact and visual fields are full to confrontational testing. Speech is fluent and clear. Soft palate rises symmetrically and there is no tongue deviation. Hearing is intact to conversational tone. Tone: Tone is good throughout. Sensation: Sensation is intact to light touch touch throughout (facial, trunk, extremities). Vibration is intact at the bilateral big toe. There is no extinction with double simultaneous stimulation. There is no sensory dermatomal level identified. Coordination:  The patient has no dysdiadichokinesia or dysmetria. Motor: Strength is 5/5 in the bilateral upper and lower extremities.  Shoulder shrug is equal bilaterally.  There is no pronator drift.  There are no fasciculations noted. DTR's: Deep tendon reflexes are 2/4 at the bilateral biceps, triceps, brachioradialis, patella and achilles.  Plantar responses are downgoing bilaterally. Gait and Station: The patient is able to ambulate without difficulty. The patient is able to heel toe walk without any difficulty. The patient is able to ambulate in a tandem fashion. The patient  is able to stand in the Romberg position.   MOVEMENT EXAM: Tremor:  There is  tremor in the UE, noted most significantly with action.  The right is worse than the left.  It is better when she is given a weight.  She has tremor with Archimedes spirals, but she is able to draw the spirals.  There is no tremor at rest.  She is able to pour water from 1 glass to another without spilling it, but tremor is evident. Abnormal movements:  There is lower facial spasm (mostly orbicularis oris on the right but perhaps some zygomaticus)  I have reviewed and interpreted the following labs independently   Chemistry      Component Value Date/Time   NA 136 04/07/2020 1531   NA 139 06/03/2019 0840   K 4.4 04/07/2020 1531   CL 103 04/07/2020 1531   CO2 25 04/07/2020 1531   BUN 30 (H) 04/07/2020 1531   BUN 28 (H) 06/03/2019 0840   CREATININE 1.17 (H) 04/07/2020 1531        Component Value Date/Time   CALCIUM 10.3 04/07/2020 1531   CALCIUM 10.3 04/07/2020 1531   ALKPHOS 66 06/03/2019 0840   AST 20 03/29/2020 1443   ALT 23 03/29/2020 1443   BILITOT 0.5 03/29/2020 1443   BILITOT 0.3 06/03/2019 0840      Lab Results  Component Value Date   WBC 10.2 03/29/2020   HGB 12.4 03/29/2020   HCT 36.6 03/29/2020   MCV 92.9 03/29/2020   PLT 366 03/29/2020   Lab Results  Component Value Date   TSH 3.31 03/29/2020   Lab Results  Component Value Date   LITHIUM 0.8 06/03/2019   NA 136 04/07/2020   BUN 30 (H) 04/07/2020   CREATININE 1.17 (H) 04/07/2020   TSH 3.31 03/29/2020   WBC 10.2 03/29/2020      Total time spent on today's visit was 45 minutes, including both face-to-face time and nonface-to-face time.  Time included that spent on review of records (prior notes available to me/labs/imaging if pertinent), discussing treatment and goals, answering patient's questions and coordinating care.  CC:  Panosh, Neta Mends, MD

## 2020-05-03 ENCOUNTER — Telehealth: Payer: Self-pay | Admitting: Physician Assistant

## 2020-05-03 ENCOUNTER — Encounter: Payer: Self-pay | Admitting: Neurology

## 2020-05-03 ENCOUNTER — Ambulatory Visit (INDEPENDENT_AMBULATORY_CARE_PROVIDER_SITE_OTHER): Payer: 59 | Admitting: Neurology

## 2020-05-03 ENCOUNTER — Other Ambulatory Visit: Payer: Self-pay

## 2020-05-03 VITALS — BP 125/67 | HR 69 | Ht 61.0 in | Wt 138.0 lb

## 2020-05-03 DIAGNOSIS — G251 Drug-induced tremor: Secondary | ICD-10-CM

## 2020-05-03 DIAGNOSIS — G5131 Clonic hemifacial spasm, right: Secondary | ICD-10-CM

## 2020-05-03 NOTE — Patient Instructions (Signed)
A referral to [facility] has been placed for your MRI someone will contact you directly to schedule your appt. They are located at [address] Please contact them directly by calling [phone] with any questions regarding your referral.  

## 2020-05-03 NOTE — Telephone Encounter (Signed)
Pt LM on VM stating she needs to talk to T.Hurst about a bad tremor she has had and she saw a Neuro today and they think it is caused by her Lithium. Contact Pt @ [phone]

## 2020-05-04 NOTE — Telephone Encounter (Signed)
Please triage.  Let her know that I reviewed Dr. Don Perking note from yesterday.I would like to decrease the lithium to a total of 600 mg.  In the past, has she been able to tolerate the instant release lithium?  If so, I will send that in so we have more flexibility of dosing than with the extended release.  I do not think there is a need to get another lithium level since we are decreasing the dose, and she and I can discuss further at her next appointment the end of the month.

## 2020-05-04 NOTE — Telephone Encounter (Signed)
Patient had visit yesterday so is she referring something to after her visit?

## 2020-05-05 ENCOUNTER — Other Ambulatory Visit: Payer: Self-pay | Admitting: Physician Assistant

## 2020-05-05 MED ORDER — LITHIUM CARBONATE ER 300 MG PO TBCR: 600.0000 mg | EXTENDED_RELEASE_TABLET | Freq: Two times a day (BID) | ORAL | 1 refills | Status: DC

## 2020-05-05 NOTE — Telephone Encounter (Signed)
Rtc to patient and given instructions to decrease her lithium to 600 mg daily. She would like you to do what you think is best she is agreeable to try the instant release. She wasn't sure about taking it prior. Please send new Rx to [facility]. She is very worried about what this will do to her depression.

## 2020-05-05 NOTE — Telephone Encounter (Signed)
I decided to keep her on the long acting after all.  I sent in the lithium CR 300 mg and she will take 1 twice a day.  Try to reassure her that this should not increase the depression.  It is not really that much of a change as far as the benefits of lithium goes but it can make a big difference with side effects such as tremor.

## 2020-05-06 ENCOUNTER — Other Ambulatory Visit: Payer: Self-pay | Admitting: Physician Assistant

## 2020-05-06 ENCOUNTER — Telehealth: Payer: Self-pay | Admitting: Physician Assistant

## 2020-05-06 MED ORDER — LITHIUM CARBONATE ER 300 MG PO TBCR: 300.0000 mg | EXTENDED_RELEASE_TABLET | Freq: Two times a day (BID) | ORAL | 1 refills | Status: DC

## 2020-05-06 NOTE — Telephone Encounter (Signed)
Reviewed thank you 

## 2020-05-06 NOTE — Telephone Encounter (Signed)
Pharmacy called and said that the they have a question on the quantity of the litium script. It says 60 but she takes it 2x d so should it be 180 . Please call the pharmacy back at [phone]

## 2020-05-06 NOTE — Telephone Encounter (Signed)
Yes, I see my mistake. I sent in the corrected Rx to [facility].

## 2020-05-06 NOTE — Telephone Encounter (Signed)
Patient updated with new information and did already decreased her dose with what she had available at home. Reassured her about depression. Instructed to call back with further concerns.

## 2020-05-06 NOTE — Telephone Encounter (Signed)
Looks like the wrong instructions sent in, shouldn't be 2 (300 mg) twice a day for her Lithium

## 2020-05-09 ENCOUNTER — Other Ambulatory Visit: Payer: Self-pay | Admitting: Internal Medicine

## 2020-05-26 ENCOUNTER — Other Ambulatory Visit: Payer: Self-pay

## 2020-05-26 ENCOUNTER — Encounter: Payer: Self-pay | Admitting: Cardiology

## 2020-05-26 ENCOUNTER — Ambulatory Visit
Admission: RE | Admit: 2020-05-26 | Discharge: 2020-05-26 | Disposition: A | Discharge status: Home / Self Care | Payer: 59 | Source: Ambulatory Visit | Attending: Neurology | Admitting: Neurology

## 2020-05-26 ENCOUNTER — Ambulatory Visit (INDEPENDENT_AMBULATORY_CARE_PROVIDER_SITE_OTHER): Payer: 59 | Admitting: Physician Assistant

## 2020-05-26 ENCOUNTER — Encounter: Payer: Self-pay | Admitting: Physician Assistant

## 2020-05-26 ENCOUNTER — Ambulatory Visit: Payer: 59 | Admitting: Cardiology

## 2020-05-26 VITALS — BP 118/70 | HR 61 | Ht 61.0 in | Wt 137.0 lb

## 2020-05-26 DIAGNOSIS — Z8249 Family history of ischemic heart disease and other diseases of the circulatory system: Secondary | ICD-10-CM | POA: Diagnosis not present

## 2020-05-26 DIAGNOSIS — G4700 Insomnia, unspecified: Secondary | ICD-10-CM | POA: Diagnosis not present

## 2020-05-26 DIAGNOSIS — F319 Bipolar disorder, unspecified: Secondary | ICD-10-CM

## 2020-05-26 DIAGNOSIS — E785 Hyperlipidemia, unspecified: Secondary | ICD-10-CM

## 2020-05-26 DIAGNOSIS — Z79899 Other long term (current) drug therapy: Secondary | ICD-10-CM | POA: Diagnosis not present

## 2020-05-26 DIAGNOSIS — F411 Generalized anxiety disorder: Secondary | ICD-10-CM | POA: Diagnosis not present

## 2020-05-26 MED ORDER — GADOBENATE DIMEGLUMINE 529 MG/ML IV SOLN
12.0000 mL | Freq: Once | INTRAVENOUS | Status: AC | PRN
  Administered 2020-05-26: 12 mL via INTRAVENOUS

## 2020-05-26 MED ORDER — ESCITALOPRAM OXALATE 20 MG PO TABS: 20.0000 mg | ORAL_TABLET | Freq: Every day | ORAL | 1 refills | Status: DC

## 2020-05-26 MED ORDER — MODAFINIL 200 MG PO TABS: 100.0000 mg | ORAL_TABLET | Freq: Every day | ORAL | 5 refills | Status: DC

## 2020-05-26 MED ORDER — ALPRAZOLAM 0.5 MG PO TABS: ORAL_TABLET | ORAL | 1 refills | Status: DC

## 2020-05-26 MED ORDER — LOSARTAN POTASSIUM 25 MG PO TABS: 25.0000 mg | ORAL_TABLET | Freq: Every day | ORAL | 3 refills | Status: DC

## 2020-05-26 NOTE — Progress Notes (Signed)
[facility] Med Check  Patient ID: Lisa Lee,  MRN: [id number]  PCP: Lisa Headings, MD  Date of Evaluation: 05/26/2020 Time spent:30 minutes  Chief Complaint:  Chief Complaint    Anxiety; Depression      HISTORY/CURRENT STATUS: HPI For routine med check.  Patient states she saw Lisa Lee since the last visit for the tremor.  Lisa Lee stated the tremor is most likely due to lithium. Lisa Lee was stable at that point so I decreased the lithium on 05/03/2020.  The tremor decreased dramatically after the lithium was decreased.  It still happens occasionally but she is able to hold things, write, and eat without spilling things.  She does have a tic around her mouth, Lisa Lee ordered an MRI, which is to be done this afternoon.  She is able to enjoy things.  Her mood is good.  Her daughter gave birth 2 months ago and patient is enjoying her granddaughter.  Energy and motivation are good.  Appetite and weight are stable.  No suicidal or homicidal thoughts.  Anxiety is controlled.  She is sleeping well.  Work is going well.  Patient denies increased energy with decreased need for sleep, no increased talkativeness, no racing thoughts, no impulsivity or risky behaviors, no increased spending, no increased libido, no grandiosity, no increased irritability or anger, and no hallucinations.  Denies dizziness, syncope, seizures, numbness, tingling, unsteady gait, slurred speech, confusion. Denies muscle or joint pain, stiffness, or dystonia.  Individual Medical History/ Review of Systems: Changes? :Yes   See above  Past medications for mental health diagnoses include: Lexapro, lithium  Allergies: Penicillins, Pravastatin, and Rosuvastatin  Current Medications:  Current Outpatient Medications:  .  albuterol (PROAIR HFA) 108 (90 Base) MCG/ACT inhaler, Inhale 2 puffs into the lungs every 6 (six) hours as needed for wheezing., Disp: 1 each, Rfl: 1 .  ALPRAZolam (XANAX) 0.5 MG tablet,  Take 1/2 to 1 tablet (0.25-0.5 mg total) by mouth 2 times daily as needed for anxiety., Disp: 30 tablet, Rfl: 1 .  aspirin EC 81 MG tablet, Take 1 tablet (81 mg total) by mouth daily., Disp: , Rfl:  .  atorvastatin (LIPITOR) 10 MG tablet, Take 1 tablet (10 mg total) by mouth daily., Disp: 90 tablet, Rfl: 3 .  Cholecalciferol (VITAMIN D) 2000 units tablet, Take 2,000 Units by mouth in the morning and at bedtime. , Disp: , Rfl:  .  dextromethorphan-guaiFENesin (MUCINEX DM) 30-600 MG 12hr tablet, Take 1 tablet by mouth 2 (two) times daily as needed for cough. , Disp: , Rfl:  .  doxylamine, Sleep, (UNISOM) 25 MG tablet, Take 25 mg by mouth at bedtime as needed., Disp: , Rfl:  .  escitalopram (LEXAPRO) 20 MG tablet, Take 1 tablet (20 mg total) by mouth daily., Disp: 90 tablet, Rfl: 1 .  famotidine (PEPCID) 20 MG tablet, TAKE 1 TABLET AT BEDTIME, Disp: 90 tablet, Rfl: 3 .  fenofibrate 160 MG tablet, TAKE 1 TABLET BY MOUTH EVERY DAY., Disp: 90 tablet, Rfl: 3 .  Ginkgo Biloba 40 MG TABS, Take 1 tablet by mouth in the morning and at bedtime., Disp: , Rfl:  .  levothyroxine (SYNTHROID) 75 MCG tablet, Take 1 tablet (75 mcg total) by mouth daily. Please schedule yearly visit with labs for further refills. [phone], Disp: 90 tablet, Rfl: 0 .  lithium carbonate (LITHOBID) 300 MG CR tablet, Take 1 tablet (300 mg total) by mouth 2 (two) times daily., Disp: 60 tablet, Rfl: 1 .  loperamide (IMODIUM  A-D) 2 MG tablet, Take 2 mg by mouth daily. , Disp: , Rfl:  .  losartan (COZAAR) 25 MG tablet, Take 1 tablet (25 mg total) by mouth daily., Disp: 90 tablet, Rfl: 3 .  modafinil (PROVIGIL) 200 MG tablet, Take 0.5 tablets (100 mg total) by mouth daily., Disp: 30 tablet, Rfl: 5 .  Multiple Vitamins-Minerals (MULTIVITAMIN WITH MINERALS) tablet, Take 1 tablet by mouth daily., Disp: , Rfl:  .  pantoprazole (PROTONIX) 40 MG tablet, TAKE 1 TABLET DAILY (HAS AN APPOINTMENT SCHEDULED IN AUGUST), Disp: 90 tablet, Rfl: 3 .   valACYclovir (VALTREX) 500 MG tablet, TAKE 1 TABLET DAILY FOR SUPPRESSION AND 1 TABLET TWICE A DAY FOR 3 DAYS FOR OUTBREAK, Disp: 30 tablet, Rfl: 13 Medication Side Effects: none  Family Medical/ Social History: Changes? No  MENTAL HEALTH EXAM:  Last menstrual period 12/09/2013.There is no height or weight on file to calculate BMI.  General Appearance: Casual, Neat and Well Groomed  Eye Contact:  Good  Speech:  Clear and Coherent and Normal Rate  Volume:  Normal  Mood:  Euthymic  Affect:  Appropriate  Thought Process:  Goal Directed and Descriptions of Associations: Intact  Orientation:  Full (Time, Place, and Person)  Thought Content: Logical   Suicidal Thoughts:  No  Homicidal Thoughts:  No  Memory:  WNL  Judgement:  Good  Insight:  Good  Psychomotor Activity:  No tremor during our exam.  I asked her to remove her mask and for those few minutes, I did not see a tic  Concentration:  Concentration: Good and Attention Span: Good  Recall:  Good  Fund of Knowledge: Good  Language: Good  Assets:  Desire for Improvement  ADL's:  Intact  Cognition: WNL  Prognosis:  Good   Most pertinent recent labs:  01/28/2020 lithium level was 0.7. 03/29/2020 TSH   3.31 04/07/2020 BUN 30, creatinine 1.17 (slightly elevated) calcium 10.3    DIAGNOSES:    ICD-10-CM   1. Encounter for long-term (current) use of medications  Z79.899 Lithium level  2. Bipolar I disorder (HCC)  F31.9   3. Generalized anxiety disorder  F41.1   4. Insomnia, unspecified type  G47.00     Receiving Psychotherapy: No    RECOMMENDATIONS:  PDMP was reviewed. I provided 30 minutes of face to face time during this encounter, both with the patient and after the visit. Dr. Don Lee note and lab results were reviewed. We agreed to make no changes at this time.  She did ask about possibly decreasing the lithium even more.  Because we are getting into the holiday season and the often depressing winter months, we agreed to make  no changes now.  We will keep that in mind at the next visit, as long as she continues to do well. Continue Xanax 0.5 mg, 1/2-1 p.o. twice daily as needed. Continue Synthroid 75 mcg every morning. Continue lithium CR 300 mg, 1 p.o. twice daily. Continue Lexapro 20 mg, 1 p.o. every morning. Continue modafinil 200 mg, 1/4 -1/2 every morning as needed. We will get lithium level 2 to 3 weeks before her next visit. Return in 3 months.   Melony Overly, PA-C

## 2020-05-26 NOTE — Patient Instructions (Addendum)
Medication Instructions:   DECREASE YOUR LOSARTAN TO 25 MG BY MOUTH DAILY  *If you need a refill on your cardiac medications before your next appointment, please call your pharmacy*   Follow-Up: At [facility], you and your health needs are our priority.  As part of our continuing mission to provide you with exceptional heart care, we have created designated Provider Care Teams.  These Care Teams include your primary Cardiologist (physician) and Advanced Practice Providers (APPs -  Physician Assistants and Nurse Practitioners) who all work together to provide you with the care you need, when you need it.  We recommend signing up for the patient portal called "MyChart".  Sign up information is provided on this After Visit Summary.  MyChart is used to connect with patients for Virtual Visits (Telemedicine).  Patients are able to view lab/test results, encounter notes, upcoming appointments, etc.  Non-urgent messages can be sent to your provider as well.   To learn more about what you can do with MyChart, go to [email or web address].    Your next appointment:   1 year(s)  The format for your next appointment:   In Person  Provider:   Tobias Alexander, MD   Other Instructions  DR. NELSON WOULD LIKE FOR YOUR BP TO BE 130/70 AND BELOW

## 2020-05-26 NOTE — Progress Notes (Signed)
e

## 2020-05-26 NOTE — Progress Notes (Signed)
Cardiology Office Note    Date:  05/29/2020   ID:  Lisa Lee, DOB [birth year], MRN [id number]  PCP:  Madelin Headings, MD  Cardiologist:  Tobias Alexander, MD   Reason for visit: 1 year follow-up  History of Present Illness:  Lisa Lee is a 59 y.o. female with history of hyperlipidemia and significant FH of premature CAD. She is very active and asymptomatic.  She is very active and follows heart healthy diet. She underwent a calcium scoring in 2013 that was 0.   In the past she could not tolerate multiple statins including rosuvastatin atorvastatin Livalo and pravastatin but eventually is able to tolerate low-dose atorvastatin and fenofibrate.  She is very conscious about her diet and healthy weight.  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  She has been dealing with a lot of stress from family.  Past Medical History:  Diagnosis Date  . Acid reflux   . Allergic rhinitis   . Asthma   . Depression   . Diarrhea 08/09/2011   Poss ibs  Vs other  Related to stress  consdier other eval if needed. Had colonoscoopy per dr Matthias Hughs   . Hemorrhoids   . High cholesterol   . 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 Hypothyroidism   . Medication side effect 11/01/2011   tussionex   distrubed sleep   . Thyroid disease     Past Surgical History:  Procedure Laterality Date  . CERVICAL CONIZATION W/BX N/A 07/21/2015   Procedure: CONIZATION CERVIX WITH BIOPSY;  Surgeon: Kirkland Hun, MD;  Location: [facility];  Service: Gynecology;  Laterality: N/A;  . CERVICAL CONIZATION W/BX N/A 09/25/2018   Procedure: CONIZATION CERVIX WITH BIOPSY;  Surgeon: Adolphus Birchwood, MD;  Location: [facility];  Service: Gynecology;  Laterality: N/A;  . CERVICAL CRYOTHERAPY  1984  . COLPOSCOPY  08/26/13  .  DILATION AND CURETTAGE, DIAGNOSTIC / THERAPEUTIC  1994   Blighted Ovum  . HYSTEROSCOPY     uterine polypectomy Jan 7th  . HYSTEROSCOPY WITH RESECTOSCOPE  08/04/2009   Removed polyp & IUD  . LYMPH NODE BIOPSY    . TONSILLECTOMY      Current Medications: Outpatient Medications Prior to Visit  Medication Sig Dispense Refill  . albuterol (PROAIR HFA) 108 (90 Base) MCG/ACT inhaler Inhale 2 puffs into the lungs every 6 (six) hours as needed for wheezing. 1 each 1  . aspirin EC 81 MG tablet Take 1 tablet (81 mg total) by mouth daily.    Marland Kitchen atorvastatin (LIPITOR) 10 MG tablet Take 1 tablet (10 mg total) by mouth daily. 90 tablet 3  . Cholecalciferol (VITAMIN D) 2000 units tablet Take 2,000 Units by mouth in the morning and at bedtime.     Marland Kitchen dextromethorphan-guaiFENesin (MUCINEX DM) 30-600 MG 12hr tablet Take 1 tablet by mouth 2 (two) times daily as needed for cough.     . doxylamine, Sleep, (UNISOM) 25 MG tablet Take 25 mg by mouth at bedtime as needed.    . famotidine (PEPCID) 20 MG tablet TAKE 1 TABLET AT BEDTIME 90 tablet 3  . fenofibrate 160 MG tablet TAKE 1 TABLET BY MOUTH EVERY DAY. 90 tablet 3  . Ginkgo Biloba 40 MG TABS Take 1 tablet by mouth in the morning and at bedtime.    Marland Kitchen  levothyroxine (SYNTHROID) 75 MCG tablet Take 1 tablet (75 mcg total) by mouth daily. Please schedule yearly visit with labs for further refills. [phone] 90 tablet 0  . lithium carbonate (LITHOBID) 300 MG CR tablet Take 1 tablet (300 mg total) by mouth 2 (two) times daily. 60 tablet 1  . loperamide (IMODIUM A-D) 2 MG tablet Take 2 mg by mouth daily.     . Multiple Vitamins-Minerals (MULTIVITAMIN WITH MINERALS) tablet Take 1 tablet by mouth daily.    . pantoprazole (PROTONIX) 40 MG tablet TAKE 1 TABLET DAILY (HAS AN APPOINTMENT SCHEDULED IN AUGUST) 90 tablet 3  . valACYclovir (VALTREX) 500 MG tablet TAKE 1 TABLET DAILY FOR SUPPRESSION AND 1 TABLET TWICE A DAY FOR 3 DAYS FOR OUTBREAK 30 tablet 13  . ALPRAZolam  (XANAX) 0.5 MG tablet Take 1/2 to 1 tablet (0.25-0.5 mg total) by mouth 2 times daily as needed for anxiety. 30 tablet 1  . escitalopram (LEXAPRO) 20 MG tablet Take 1 tablet (20 mg total) by mouth daily. 90 tablet 1  . losartan (COZAAR) 50 MG tablet Take 1 tablet (50 mg total) by mouth daily. Please keep upcoming appt in October with Dr. Delton SeeNelson for future refills. Thank you 90 tablet 0  . modafinil (PROVIGIL) 200 MG tablet Take 0.5 tablets (100 mg total) by mouth daily. (Patient taking differently: Take 50 mg by mouth daily. ) 30 tablet 5  . Estradiol (IMVEXXY MAINTENANCE PACK) 10 MCG INST Place 10 mcg vaginally 2 (two) times a week.  (Patient not taking: Reported on 05/26/2020)     No facility-administered medications prior to visit.     Allergies:   Penicillins, Pravastatin, and Rosuvastatin   Social History   Socioeconomic History  . Marital status: Married    Spouse name: Not on file  . Number of children: Not on file  . Years of education: Not on file  . Highest education level: Not on file  Occupational History  . Not on file  Tobacco Use  . Smoking status: Never Smoker  . Smokeless tobacco: Never Used  Vaping Use  . Vaping Use: Never used  Substance and Sexual Activity  . Alcohol use: Yes    Alcohol/week: 0.0 standard drinks    Comment: monthly ocassional drink  . Drug use: No  . Sexual activity: Yes    Birth control/protection: None  Other Topics Concern  . Not on file  Social History Narrative    [employer]. 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 Difficulty of Paying Living Expenses: Not on file  Food Insecurity:   . Worried About Programme researcher, broadcasting/film/videounning Out of Food in the Last Year: Not on file  . Ran Out of Food in the Last Year: Not on file   Transportation Needs:   . Lack of Transportation (Medical): Not on file  . Lack of Transportation (Non-Medical): Not on file  Physical Activity:   . Days of Exercise per Week: Not on file  . Minutes of Exercise per Session: Not on file  Stress:   . Feeling of Stress : Not on file  Social Connections:   . Frequency of Communication with Friends and Family: Not on file  .  Frequency of Social Gatherings with Friends and Family: Not on file  . Attends Religious Services: Not on file  . Active Member of Clubs or Organizations: Not on file  . Attends Banker Meetings: Not on file  . Marital Status: Not on file     Family History:  The patient's family history includes Arthritis in her mother; Colon cancer in her father; Heart disease in her brother, father, mother, and sister; Heart disease (age of onset: 59) in her sister; Parkinson's disease in her father; Pulmonary embolism in her daughter; Thyroid disease in her daughter and sister.   ROS:   Please see the history of present illness.    ROS All other systems reviewed and are negative.   PHYSICAL EXAM:   VS:  BP 118/70   Pulse 61   Ht 5\' 1"  (1.549 m)   Wt 137 lb (62.1 kg)   LMP 12/09/2013   SpO2 99%   BMI 25.89 kg/m    GEN: Well nourished, well developed, in no acute distress  HEENT: normal  Neck: no JVD, carotid bruits, or masses Cardiac: RRR; no murmurs, rubs, or gallops,no edema  Respiratory:  clear to auscultation bilaterally, normal work of breathing GI: soft, nontender, nondistended, + BS MS: no deformity or atrophy  Skin: warm and dry, no rash Neuro:  Alert and Oriented x 3, Strength and sensation are intact Psych: euthymic mood, full affect  Wt Readings from Last 3 Encounters:  05/26/20 137 lb (62.1 kg)  05/03/20 138 lb (62.6 kg)  03/29/20 136 lb 6.4 oz (61.9 kg)      Studies/Labs Reviewed:   EKG:  EKG is ordered today.  The ekg ordered today demonstrates sinus bradycardia otherwise normal  EKG.  Recent Labs: 03/29/2020: ALT 23; Hemoglobin 12.4; Platelets 366; TSH 3.31 04/07/2020: BUN 30; Creat 1.17; Potassium 4.4; Sodium 136   Lipid Panel    Component Value Date/Time   CHOL 174 03/29/2020 1443   CHOL 167 06/03/2019 0840   TRIG 104 03/29/2020 1443   HDL 59 03/29/2020 1443   HDL 53 06/03/2019 0840   CHOLHDL 2.9 03/29/2020 1443   VLDL 27.0 08/01/2018 1027   LDLCALC 95 03/29/2020 1443   LDLDIRECT 101.0 10/30/2016 1051   Additional studies/ records that were reviewed today include:    ASSESSMENT:    1. Hyperlipidemia, unspecified hyperlipidemia type   2. Family history of early CAD    PLAN:  In order of problems listed above:  1. The patient is completely asymptomatic and tolerating her medications well, sometimes her blood pressure gets low and with recent weight loss we could decrease her losartan from 50 to 25 mg daily, she will monitored her blood pressures with goal less than 130/70 mmHg.   2. Her most recent cholesterol numbers were at goal with HDL 59, LDL 95 and triglycerides 104.  Her hemoglobin A1c is 5.2%.  The only homework she is given today to comply with 30 minutes of exercise 5 days a week.  Medication Adjustments/Labs and Tests Ordered: Current medicines are reviewed at length with the patient today.  Concerns regarding medicines are outlined above.  Medication changes, Labs and Tests ordered today are listed in the Patient Instructions below. Patient Instructions  Medication Instructions:   DECREASE YOUR LOSARTAN TO 25 MG BY MOUTH DAILY  *If you need a refill on your cardiac medications before your next appointment, please call your pharmacy*   Follow-Up: At [facility], you and your health needs are our priority.  As part of our continuing mission to provide you with exceptional heart care, we have created designated Provider Care Teams.  These Care Teams include your primary Cardiologist (physician) and Advanced Practice Providers (APPs -   Physician Assistants and Nurse Practitioners) who all work together to provide you with the care you need, when you need it.  We recommend signing up for the patient portal called "MyChart".  Sign up information is provided on this After Visit Summary.  MyChart is used to connect with patients for Virtual Visits (Telemedicine).  Patients are able to view lab/test results, encounter notes, upcoming appointments, etc.  Non-urgent messages can be sent to your provider as well.   To learn more about what you can do with MyChart, go to [email or web address].    Your next appointment:   1 year(s)  The format for your next appointment:   In Person  Provider:   Tobias Alexander, MD   Other Instructions  DR. Nahun Kronberg WOULD LIKE FOR YOUR BP TO BE 130/70 AND BELOW    Signed, Tobias Alexander, MD  05/29/2020 9:41 PM    [facility] [address] [place], [place], [place]  [address] Phone: [phone]; Fax: [phone]

## 2020-05-30 ENCOUNTER — Telehealth: Payer: Self-pay

## 2020-05-30 NOTE — Telephone Encounter (Signed)
Spoke with patient who states someone left a message on her voicemail stating that her MRI was normal. She states the nurse told her to follow up as scheduled. She states Dr Tat mentioned Botox. She wanted to know if she needs to set up for Botox or what is the next step in her treatment. Advised her that Dr Tat is out of the office and will return on Wednesday. Patient voiced understanding.

## 2020-05-31 NOTE — Telephone Encounter (Signed)
See last note what I told patient re: botox.  We could try a small amount of botox around the cheek area to see if helps.  However, we can't put it right around mouth or it will make her facial drooping worse.  If she wants to try, let us know and we will try to get authorization for 50 units.

## 2020-05-31 NOTE — Telephone Encounter (Signed)
Spoke with patient and she states for me to go ahead and contact her insurance for Botox and she will ponder on her decision a little but more.   Message sent to billing dept.

## 2020-06-17 ENCOUNTER — Other Ambulatory Visit: Payer: Self-pay | Admitting: Cardiology

## 2020-06-17 DIAGNOSIS — E785 Hyperlipidemia, unspecified: Secondary | ICD-10-CM

## 2020-06-20 ENCOUNTER — Encounter: Payer: Self-pay | Admitting: Neurology

## 2020-06-20 NOTE — Progress Notes (Signed)
Sent benefit verification info to [employer] for Botox. Thet tried several times to get in touch with insurance Regino Schultze ID#: [id number]/ Phone #: [phone]) and said they could not reach them. Extended over hour hold time with no response. I called multiple times as well. Same thing. So I called patient to see if another contact number for insurance on card? She said no. She said she had this issue before with insurance and other providers trying to get in contact with them. She said she wasn't sure if she even wanted to do the Botox. She was going to see if she could log onto her insurance website and find out any more information. Until then I told her I would let Dr. Arbutus Leas know. Sent staff msg where Dr. Arbutus Leas said to hold off on Botox until patient makes a decision. Awaiting patient decision and more info for insurance before we move forward with Botox.

## 2020-06-23 ENCOUNTER — Other Ambulatory Visit: Payer: Self-pay | Admitting: Cardiology

## 2020-06-29 ENCOUNTER — Other Ambulatory Visit: Payer: Self-pay

## 2020-07-11 ENCOUNTER — Other Ambulatory Visit: Payer: Self-pay

## 2020-07-11 DIAGNOSIS — E785 Hyperlipidemia, unspecified: Secondary | ICD-10-CM

## 2020-07-11 DIAGNOSIS — Z8249 Family history of ischemic heart disease and other diseases of the circulatory system: Secondary | ICD-10-CM

## 2020-07-11 MED ORDER — LOSARTAN POTASSIUM 25 MG PO TABS: 25.0000 mg | ORAL_TABLET | Freq: Every day | ORAL | 3 refills | Status: DC

## 2020-07-16 ENCOUNTER — Other Ambulatory Visit: Payer: Self-pay | Admitting: Internal Medicine

## 2020-08-03 IMAGING — DX LUMBAR SPINE - 2-3 VIEW
5 series · 5 of 5 positions shown · non-contrast
Comparison: None.

CLINICAL DATA: Low back pain

EXAM:
LUMBAR SPINE - 2-3 VIEW

[lumbar spine ap]
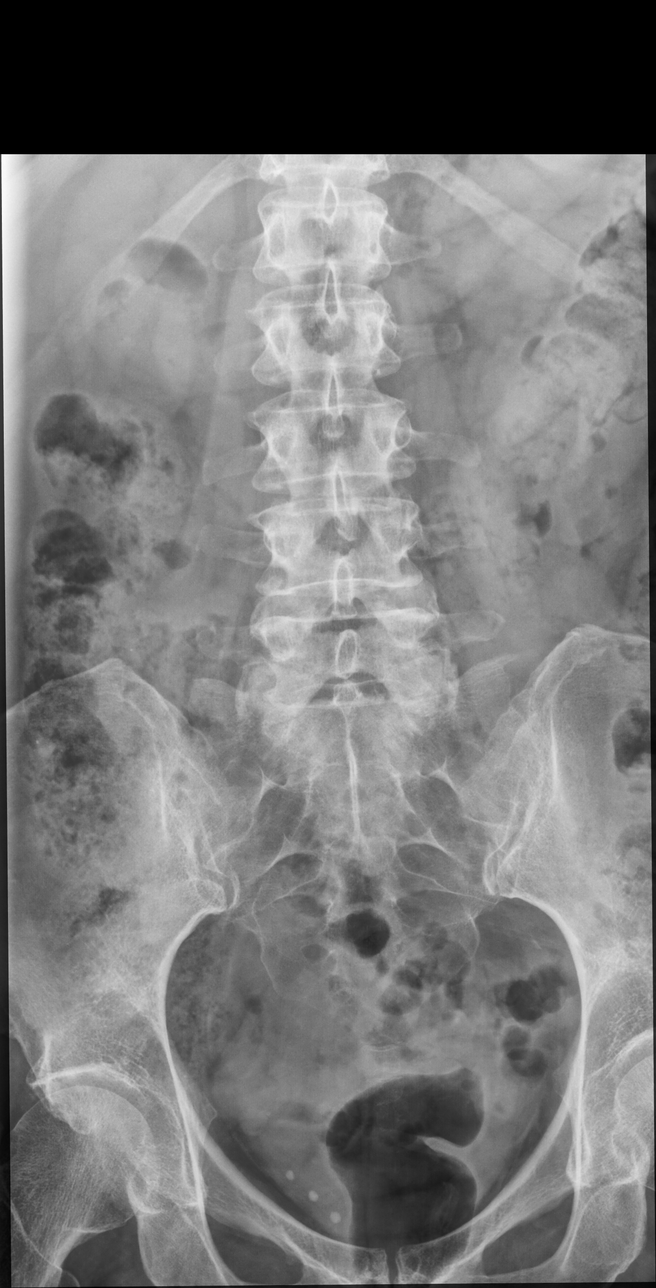

[lumbar spine lmo (1 of 2)]
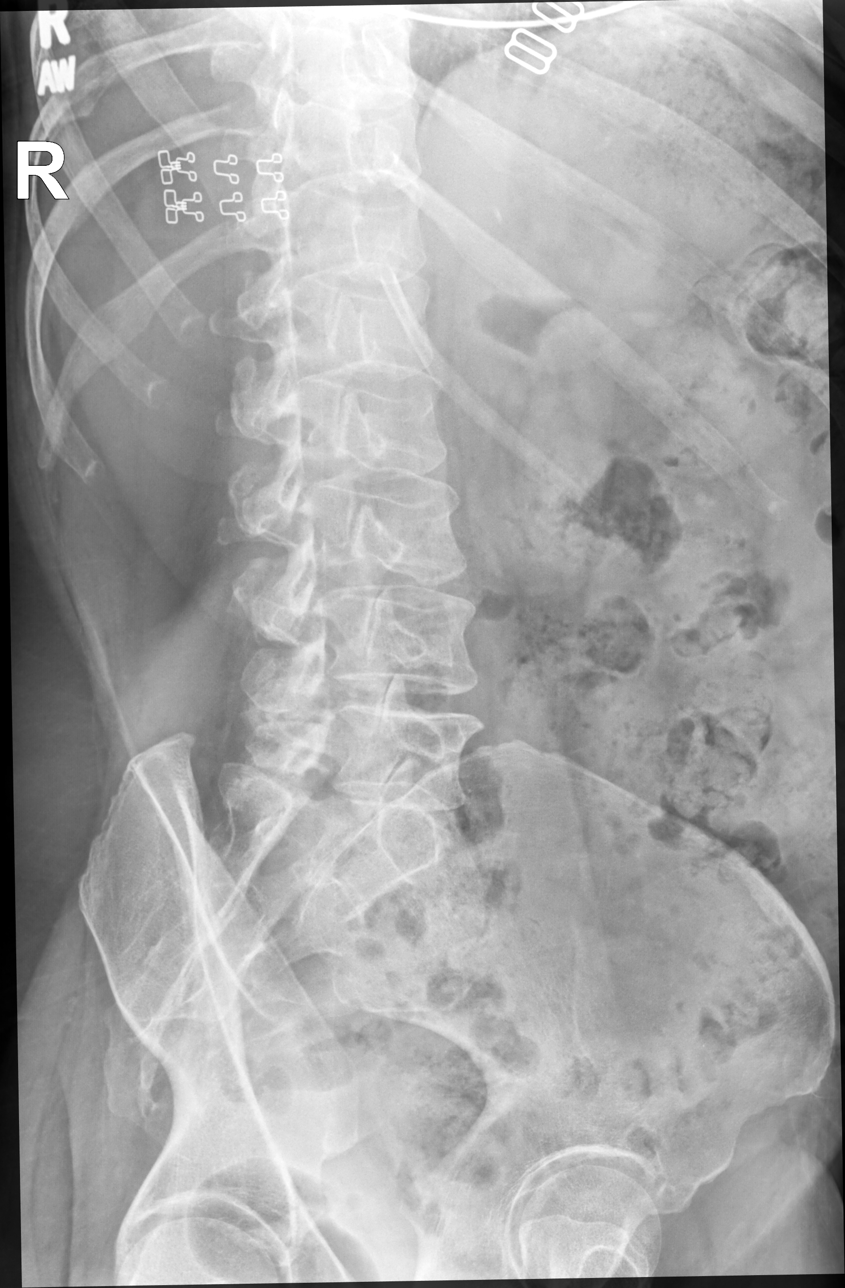

[lumbar spine lmo (2 of 2)]
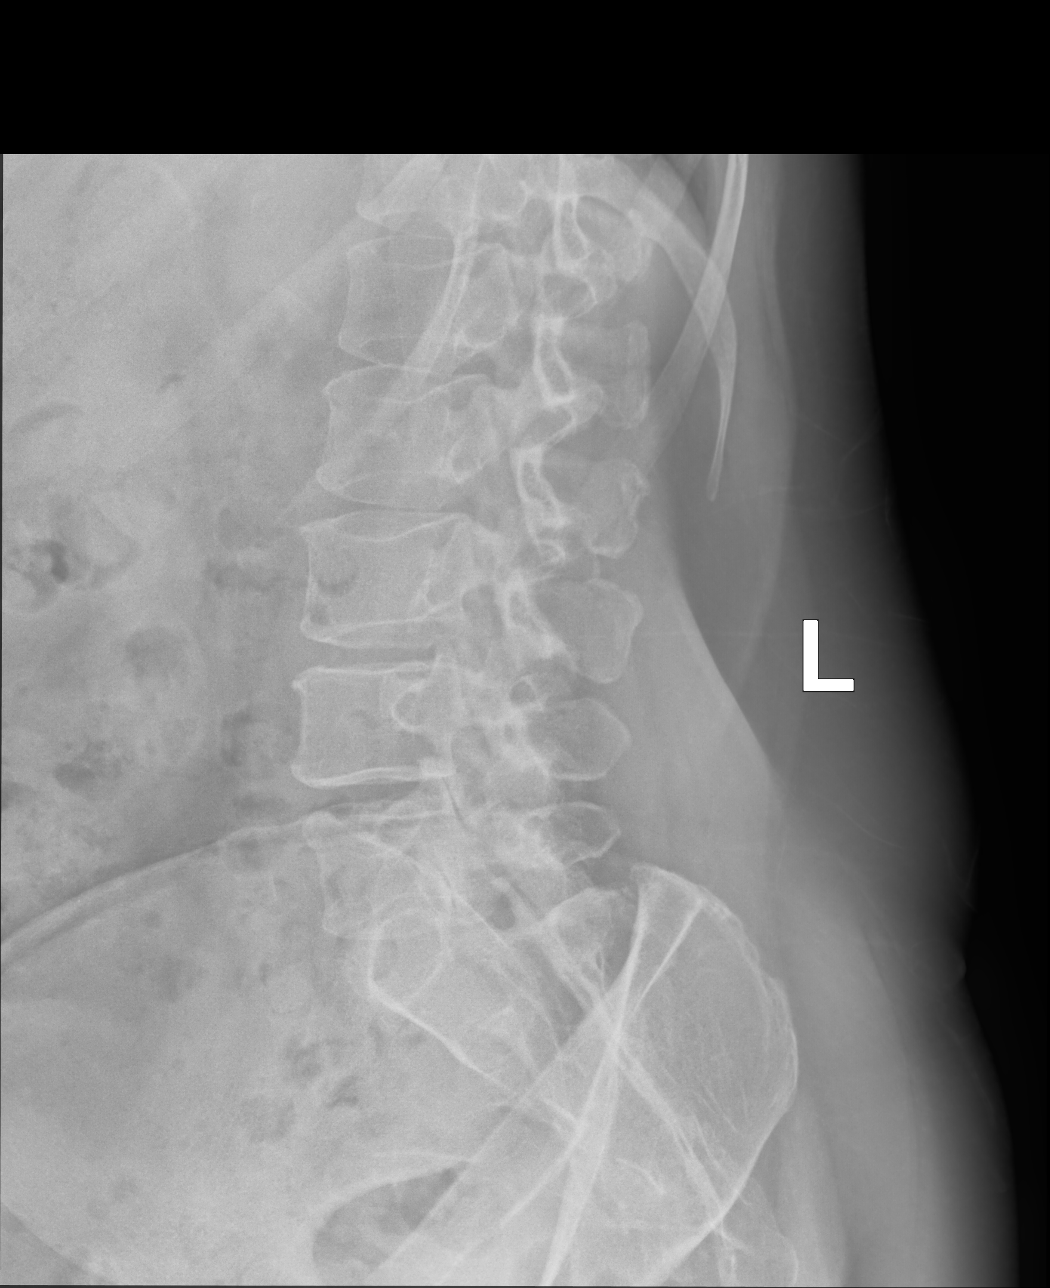

[lumbar spine lat (1 of 2)]
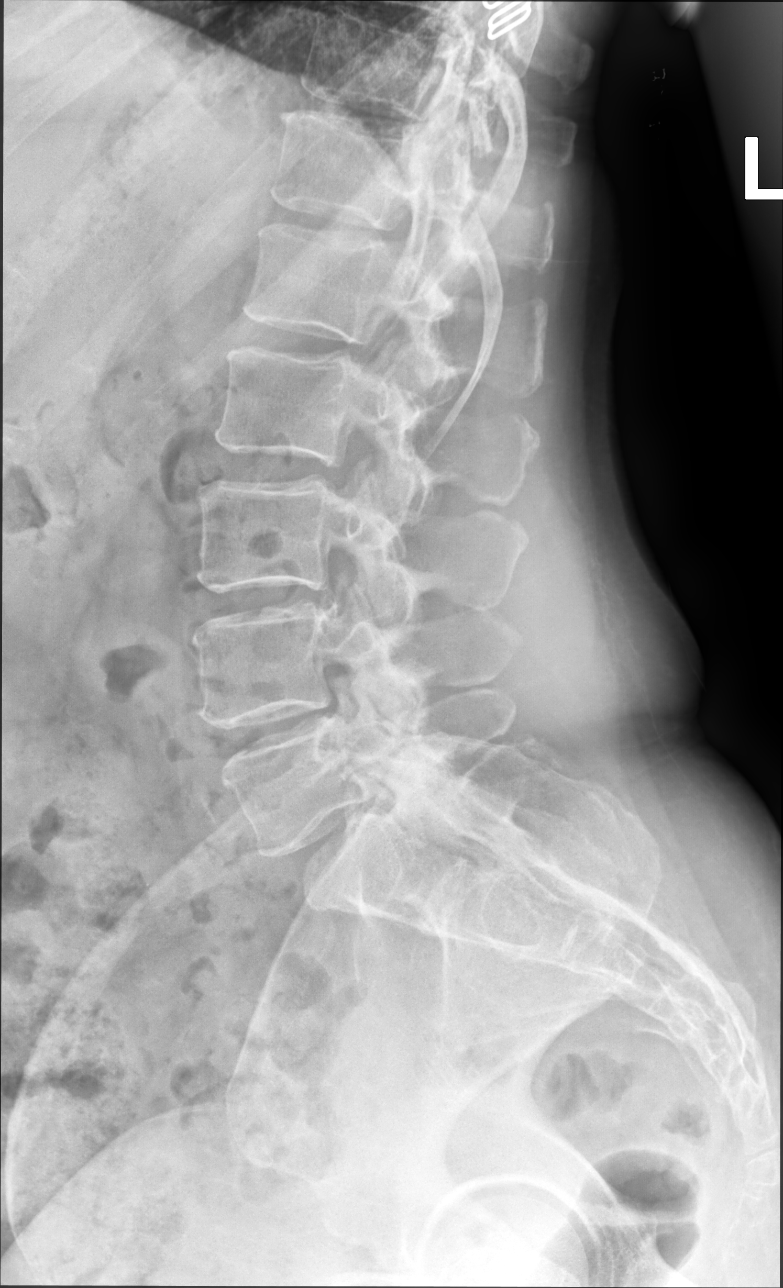

[lumbar spine lat (2 of 2)]
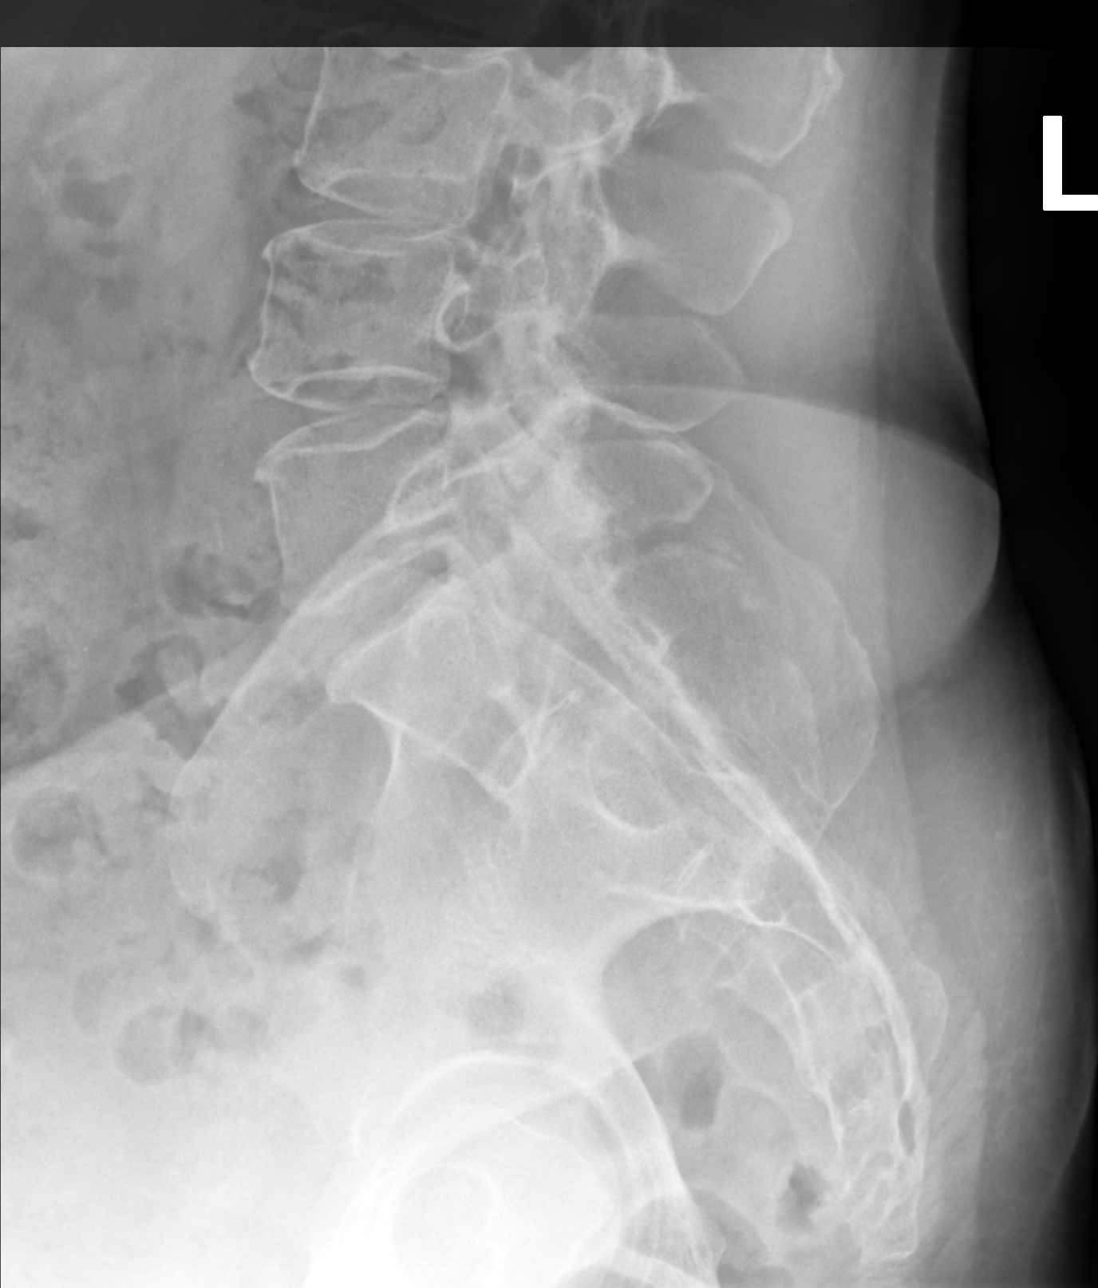

[5 of 5 positions shown; findings below may reference images not displayed]

FINDINGS: Moderate degenerative facet disease from L3-4 through L5-S1, most
pronounced at the lumbosacral junction. Early degenerative disc
disease changes with disc space narrowing at these levels. Normal
alignment. No fracture. SI joints symmetric and unremarkable.
IMPRESSION: Early degenerative disc and moderate degenerative facet disease in
the mid to lower lumbar spine. No acute bony abnormality.

## 2020-08-03 IMAGING — DX DG HIP (WITH OR WITHOUT PELVIS) 1V*L*
1 series · 1 of 1 positions shown · non-contrast
Comparison: None.

CLINICAL DATA: Left hip pain.  No known injury

EXAM:
DG HIP (WITH OR WITHOUT PELVIS) 1V*L*

[pelvis ap]
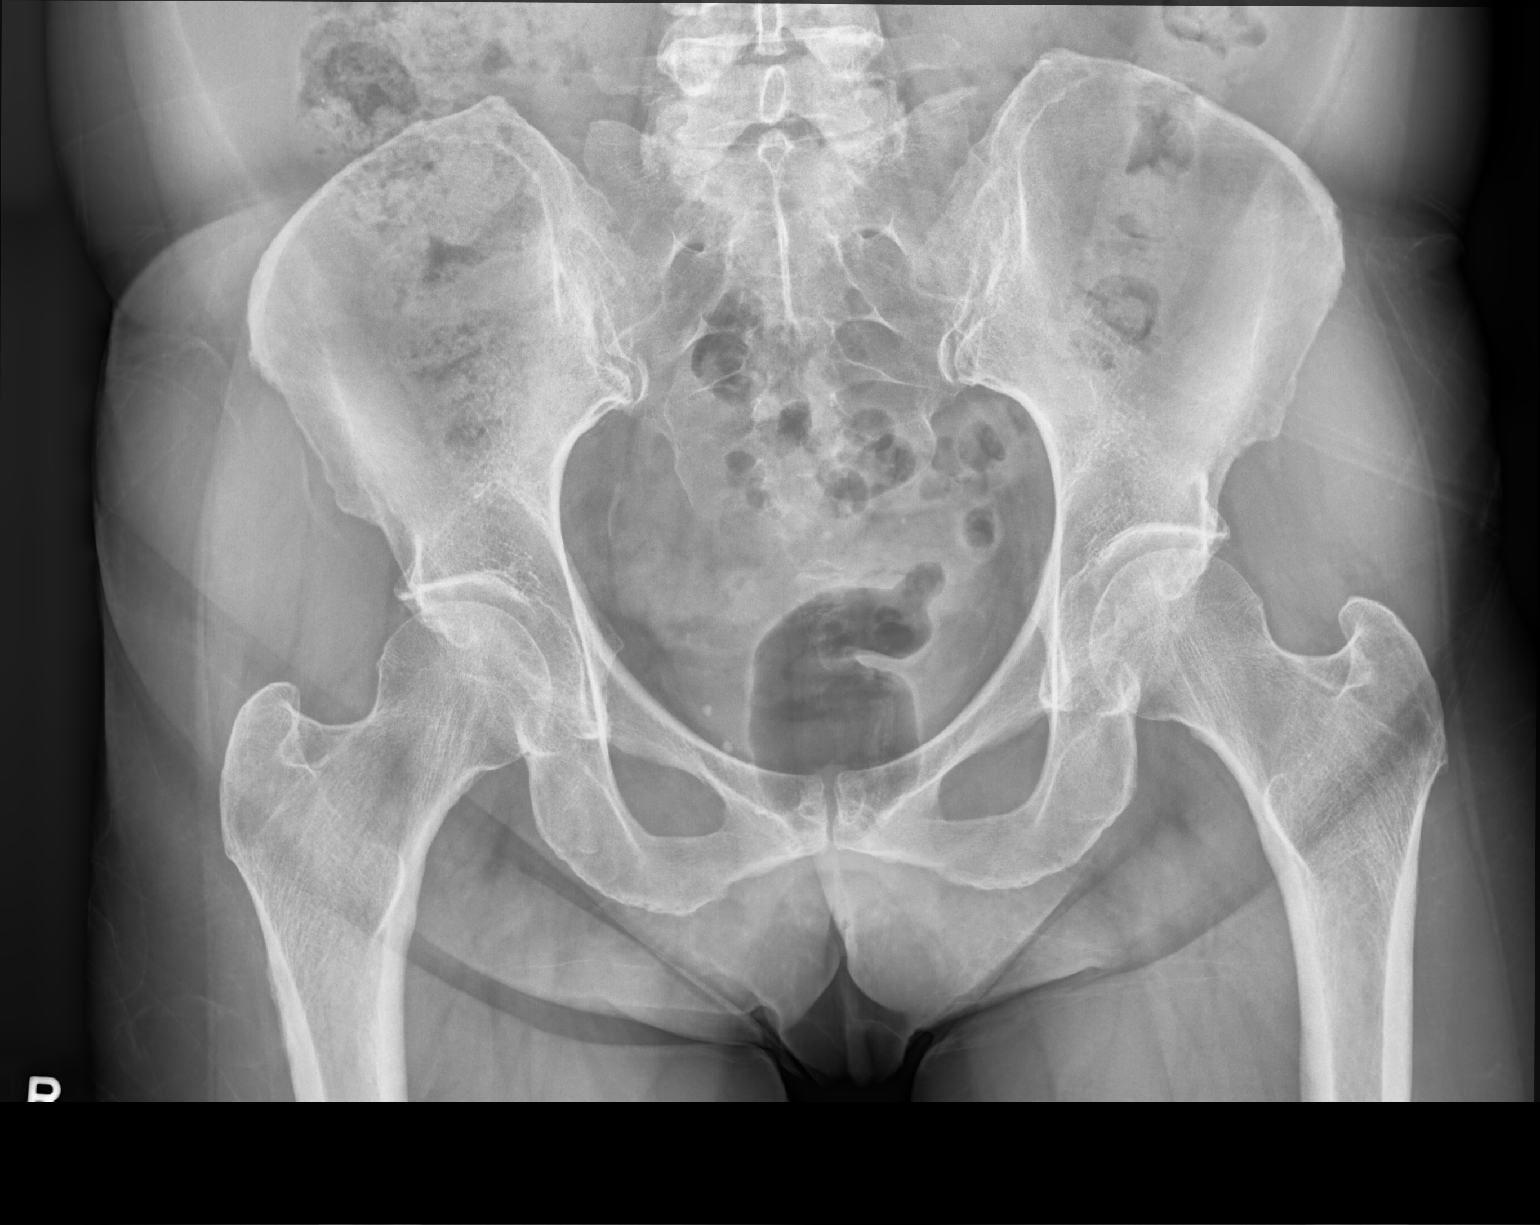

[1 of 1 positions shown; findings below may reference images not displayed]

FINDINGS: There is no evidence of hip fracture or dislocation. There is no
evidence of arthropathy or other focal bone abnormality.
IMPRESSION: Negative.

## 2020-08-23 DIAGNOSIS — Z79899 Other long term (current) drug therapy: Secondary | ICD-10-CM

## 2020-08-23 DIAGNOSIS — E559 Vitamin D deficiency, unspecified: Secondary | ICD-10-CM

## 2020-08-23 DIAGNOSIS — R7989 Other specified abnormal findings of blood chemistry: Secondary | ICD-10-CM

## 2020-08-24 NOTE — Telephone Encounter (Signed)
So   Yes  Bmp can add lithium level and vit d   Get lab appt at your convenience

## 2020-08-25 ENCOUNTER — Telehealth: Payer: Self-pay | Admitting: Physician Assistant

## 2020-08-25 ENCOUNTER — Encounter: Payer: Self-pay | Admitting: Physician Assistant

## 2020-08-25 ENCOUNTER — Other Ambulatory Visit: Payer: Self-pay

## 2020-08-25 ENCOUNTER — Telehealth (INDEPENDENT_AMBULATORY_CARE_PROVIDER_SITE_OTHER): Payer: 59 | Admitting: Physician Assistant

## 2020-08-25 DIAGNOSIS — G4700 Insomnia, unspecified: Secondary | ICD-10-CM

## 2020-08-25 DIAGNOSIS — F319 Bipolar disorder, unspecified: Secondary | ICD-10-CM

## 2020-08-25 DIAGNOSIS — F411 Generalized anxiety disorder: Secondary | ICD-10-CM | POA: Diagnosis not present

## 2020-08-25 NOTE — Progress Notes (Signed)
Lab orders have been placed

## 2020-08-25 NOTE — Telephone Encounter (Signed)
[name], Lisa Lee are scheduled for a virtual visit with your provider today.    Just as we do with appointments in the office, we must obtain your consent to participate.  Your consent will be active for this visit and any virtual visit you may have with one of our providers in the next 365 days.    If you have a MyChart account, I can also send a copy of this consent to you electronically.  All virtual visits are billed to your insurance company just like a traditional visit in the office.  As this is a virtual visit, video technology does not allow for your provider to perform a traditional examination.  This may limit your provider's ability to fully assess your condition.  If your provider identifies any concerns that need to be evaluated in person or the need to arrange testing such as labs, EKG, etc, we will make arrangements to do so.    Although advances in technology are sophisticated, we cannot ensure that it will always work on either your end or our end.  If the connection with a video visit is poor, we may have to switch to a telephone visit.  With either a video or telephone visit, we are not always able to ensure that we have a secure connection.   I need to obtain your verbal consent now.   Are you willing to proceed with your visit today?   Lisa Lee has provided verbal consent on 08/25/2020 for a virtual visit (video or telephone).   Melony Overly, PA-C 08/25/2020  11:02 AM

## 2020-08-25 NOTE — Progress Notes (Signed)
[facility] Med Check  Patient ID: Lisa Lee,  MRN: [id number]  PCP: Madelin Headings, MD  Date of Evaluation: 08/25/2020 Time spent:30 minutes  Chief Complaint:  Chief Complaint    Depression; Insomnia     Virtual Visit via Telehealth  I connected with patient by telephone, with their informed consent, and verified patient privacy and that I am speaking with the correct person using two identifiers.  I am private, in my office and the patient is at home.  I discussed the limitations, risks, security and privacy concerns of performing an evaluation and management service by telephone and the availability of in person appointments. I also discussed with the patient that there may be a patient responsible charge related to this service. The patient expressed understanding and agreed to proceed.   I discussed the assessment and treatment plan with the patient. The patient was provided an opportunity to ask questions and all were answered. The patient agreed with the plan and demonstrated an understanding of the instructions.   The patient was advised to call back or seek an in-person evaluation if the symptoms worsen or if the condition fails to improve as anticipated.  I provided 30 minutes of non-face-to-face time during this encounter.  HISTORY/CURRENT STATUS: HPI For routine med check.  Has been doing really well. Has started Intermittent Fasting, walking 4,000 steps a day. Is on week 3 and is doing fine.  She has lost about 3 pounds so far.  She does not feel hungry the way she is eating.  She is able to enjoy things.  Her mood is good.  Her daughter gave birth 2 months ago and patient is enjoying her granddaughter.  Energy and motivation are good.  No suicidal or homicidal thoughts.  Anxiety is well controlled.  She does use the Xanax daily and it is helpful.  She is especially stressed at work sometimes.  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  Sleeps well as  long as she takes Unisom.  Patient denies increased energy with decreased need for sleep, no increased talkativeness, no racing thoughts, no impulsivity or risky behaviors, no increased spending, no increased libido, no grandiosity, no increased irritability or anger, and no hallucinations.  Denies dizziness, syncope, seizures, numbness, tingling, unsteady gait, slurred speech, confusion.  The tremor is almost completely gone.  Denies muscle or joint pain, stiffness, or dystonia.  Individual Medical History/ Review of Systems: Changes? :No     Past medications for mental health diagnoses include: Lexapro, lithium  Allergies: Penicillins, Pravastatin, and Rosuvastatin  Current Medications:  Current Outpatient Medications:  .  albuterol (PROAIR HFA) 108 (90 Base) MCG/ACT inhaler, Inhale 2 puffs into the lungs every 6 (six) hours as needed for wheezing., Disp: 1 each, Rfl: 1 .  ALPRAZolam (XANAX) 0.5 MG tablet, Take 1/2 to 1 tablet (0.25-0.5 mg total) by mouth 2 times daily as needed for anxiety., Disp: 30 tablet, Rfl: 1 .  aspirin EC 81 MG tablet, Take 1 tablet (81 mg total) by mouth daily., Disp: , Rfl:  .  atorvastatin (LIPITOR) 10 MG tablet, TAKE 1 TABLET DAILY, Disp: 90 tablet, Rfl: 3 .  Cholecalciferol (VITAMIN D) 2000 units tablet, Take 2,000 Units by mouth in the morning and at bedtime. , Disp: , Rfl:  .  doxylamine, Sleep, (UNISOM) 25 MG tablet, Take 25 mg by mouth at bedtime as needed., Disp: , Rfl:  .  escitalopram (LEXAPRO) 20 MG tablet, Take 1 tablet (20 mg total) by  mouth daily., Disp: 90 tablet, Rfl: 1 .  famotidine (PEPCID) 20 MG tablet, TAKE 1 TABLET AT BEDTIME, Disp: 90 tablet, Rfl: 3 .  fenofibrate 160 MG tablet, Take 1 tablet (160 mg total) by mouth daily., Disp: 90 tablet, Rfl: 3 .  levothyroxine (SYNTHROID) 75 MCG tablet, Take 1 tablet (75 mcg total) by mouth daily before breakfast., Disp: 90 tablet, Rfl: 3 .  lithium carbonate (LITHOBID) 300 MG CR tablet, Take 1 tablet  (300 mg total) by mouth 2 (two) times daily., Disp: 60 tablet, Rfl: 1 .  loperamide (IMODIUM A-D) 2 MG tablet, Take 2 mg by mouth daily., Disp: , Rfl:  .  losartan (COZAAR) 25 MG tablet, Take 1 tablet (25 mg total) by mouth daily., Disp: 90 tablet, Rfl: 3 .  modafinil (PROVIGIL) 200 MG tablet, Take 0.5 tablets (100 mg total) by mouth daily. (Patient taking differently: Take 50 mg by mouth daily.), Disp: 30 tablet, Rfl: 5 .  Multiple Vitamins-Minerals (MULTIVITAMIN WITH MINERALS) tablet, Take 1 tablet by mouth daily., Disp: , Rfl:  .  pantoprazole (PROTONIX) 40 MG tablet, TAKE 1 TABLET DAILY (HAS AN APPOINTMENT SCHEDULED IN AUGUST), Disp: 90 tablet, Rfl: 3 .  valACYclovir (VALTREX) 500 MG tablet, TAKE 1 TABLET DAILY FOR SUPPRESSION AND 1 TABLET TWICE A DAY FOR 3 DAYS FOR OUTBREAK, Disp: 30 tablet, Rfl: 13 .  Ginkgo Biloba 40 MG TABS, Take 1 tablet by mouth in the morning and at bedtime. (Patient not taking: Reported on 08/25/2020), Disp: , Rfl:  Medication Side Effects: none  Family Medical/ Social History: Changes? No  MENTAL HEALTH EXAM:  Last menstrual period 12/09/2013.There is no height or weight on file to calculate BMI.  General Appearance: Unable to assess  Eye Contact:  Unable to assess  Speech:  Clear and Coherent and Normal Rate  Volume:  Normal  Mood:  Euthymic  Affect:  Unable to assess  Thought Process:  Goal Directed and Descriptions of Associations: Intact  Orientation:  Full (Time, Place, and Person)  Thought Content: Logical   Suicidal Thoughts:  No  Homicidal Thoughts:  No  Memory:  WNL  Judgement:  Good  Insight:  Good  Psychomotor Activity:  Unable to assess  Concentration:  Concentration: Good and Attention Span: Good  Recall:  Good  Fund of Knowledge: Good  Language: Good  Assets:  Desire for Improvement  ADL's:  Intact  Cognition: WNL  Prognosis:  Good   Most pertinent recent labs:  01/28/2020 lithium level was 0.7. 03/29/2020 TSH   3.31 04/07/2020 BUN 30,  creatinine 1.17 (slightly elevated) calcium 10.3    DIAGNOSES:    ICD-10-CM   1. Bipolar I disorder (HCC)  F31.9   2. Generalized anxiety disorder  F41.1   3. Insomnia, unspecified type  G47.00     Receiving Psychotherapy: No    RECOMMENDATIONS:  PDMP was reviewed. I provided 30 minutes of face to face time during this encounter, discussing her response to treatment.  She will be having labs drawn soon, ordered by her PCP, including a lithium level and TSH. Continue Xanax 0.5 mg, 1/2-1 p.o. twice daily as needed. Continue Synthroid 75 mcg every morning. Continue lithium CR 300 mg, 1 p.o. twice daily. Continue Lexapro 20 mg, 1 p.o. every morning. Continue modafinil 200 mg, 1/4 -1/2 every morning as needed. Return in 6 months.  Melony Overly, PA-C

## 2020-09-08 ENCOUNTER — Other Ambulatory Visit: Payer: Self-pay

## 2020-09-08 ENCOUNTER — Other Ambulatory Visit (INDEPENDENT_AMBULATORY_CARE_PROVIDER_SITE_OTHER): Payer: 59

## 2020-09-08 DIAGNOSIS — E559 Vitamin D deficiency, unspecified: Secondary | ICD-10-CM | POA: Diagnosis not present

## 2020-09-08 DIAGNOSIS — Z79899 Other long term (current) drug therapy: Secondary | ICD-10-CM | POA: Diagnosis not present

## 2020-09-08 DIAGNOSIS — R7989 Other specified abnormal findings of blood chemistry: Secondary | ICD-10-CM | POA: Diagnosis not present

## 2020-09-08 LAB — BASIC METABOLIC PANEL
BUN: 27 mg/dL — ABNORMAL HIGH (ref 6–23)
CO2: 25 mEq/L (ref 19–32)
Calcium: 10.5 mg/dL (ref 8.4–10.5)
Chloride: 106 mEq/L (ref 96–112)
Creatinine, Ser: 1.05 mg/dL (ref 0.40–1.20)
GFR: 57.61 mL/min — ABNORMAL LOW (ref >60.00)
Glucose, Bld: 90 mg/dL (ref 70–99)
Potassium: 4.4 mEq/L (ref 3.5–5.1)
Sodium: 140 mEq/L (ref 135–145)

## 2020-09-08 LAB — VITAMIN D 25 HYDROXY (VIT D DEFICIENCY, FRACTURES): VITD: 56.68 ng/mL (ref 30.00–100.00)

## 2020-09-08 NOTE — Progress Notes (Signed)
Vit d  normal range , bmp renal function the same  lithium level pending

## 2020-09-09 LAB — LITHIUM LEVEL: Lithium Lvl: 0.6 mmol/L (ref 0.6–1.2)

## 2020-09-09 NOTE — Progress Notes (Signed)
Please let patient know Lithium level is 0.6, which is good. Cont same Li dose. Dr. Fabian Sharp, thank you for sending labs to me.

## 2020-09-12 NOTE — Progress Notes (Signed)
Pt has been called and informed.  

## 2020-09-29 ENCOUNTER — Other Ambulatory Visit: Payer: Self-pay

## 2020-09-29 NOTE — Telephone Encounter (Signed)
Pt confirmed she is using this pharmacy.

## 2020-09-29 NOTE — Telephone Encounter (Signed)
Attempted to call pt and it's going to VM,will keep trying

## 2020-10-03 MED ORDER — LITHIUM CARBONATE ER 300 MG PO TBCR: 300.0000 mg | EXTENDED_RELEASE_TABLET | Freq: Two times a day (BID) | ORAL | 1 refills | Status: DC

## 2020-12-06 ENCOUNTER — Other Ambulatory Visit: Payer: Self-pay

## 2020-12-06 LAB — RESULTS CONSOLE HPV: CHL HPV: NEGATIVE

## 2020-12-06 MED ORDER — ESCITALOPRAM OXALATE 20 MG PO TABS: 20.0000 mg | ORAL_TABLET | Freq: Every day | ORAL | 1 refills | Status: DC

## 2020-12-09 LAB — HM PAP SMEAR: HPV, high-risk: NEGATIVE

## 2020-12-14 ENCOUNTER — Encounter: Payer: Self-pay | Admitting: Internal Medicine

## 2020-12-21 ENCOUNTER — Other Ambulatory Visit: Payer: Self-pay | Admitting: Physician Assistant

## 2020-12-22 NOTE — Telephone Encounter (Signed)
Please review

## 2021-01-13 NOTE — Telephone Encounter (Signed)
Hi Lisa Lee   I am not working in the office today but saw your message. Certainly stop the losartan until your blood pressure comes up to over 120 range  Please stay hydrated We can follow-up in the office or you call cardiology also for a follow-up appointment with a new provider or assistant.( They should assign you a new  provider )  Monitor your pulse also .  If  feeling worse  call for urgent advice over weekend

## 2021-02-23 ENCOUNTER — Ambulatory Visit: Payer: 59 | Admitting: Physician Assistant

## 2021-03-23 ENCOUNTER — Telehealth: Payer: 59 | Admitting: Internal Medicine

## 2021-03-24 ENCOUNTER — Telehealth (INDEPENDENT_AMBULATORY_CARE_PROVIDER_SITE_OTHER): Payer: 59 | Admitting: Family Medicine

## 2021-03-24 ENCOUNTER — Other Ambulatory Visit: Payer: Self-pay

## 2021-03-24 DIAGNOSIS — J0110 Acute frontal sinusitis, unspecified: Secondary | ICD-10-CM | POA: Diagnosis not present

## 2021-03-24 DIAGNOSIS — H109 Unspecified conjunctivitis: Secondary | ICD-10-CM

## 2021-03-24 DIAGNOSIS — B9689 Other specified bacterial agents as the cause of diseases classified elsewhere: Secondary | ICD-10-CM | POA: Diagnosis not present

## 2021-03-24 MED ORDER — HYDROCODONE BIT-HOMATROP MBR 5-1.5 MG/5ML PO SOLN: 5.0000 mL | Freq: Three times a day (TID) | ORAL | 0 refills | Status: DC | PRN

## 2021-03-24 MED ORDER — CEFDINIR 300 MG PO CAPS: 300.0000 mg | ORAL_CAPSULE | Freq: Two times a day (BID) | ORAL | 0 refills | Status: DC

## 2021-03-24 MED ORDER — POLYMYXIN B-TRIMETHOPRIM 10000-0.1 UNIT/ML-% OP SOLN: 2.0000 [drp] | OPHTHALMIC | 0 refills | Status: DC

## 2021-03-24 NOTE — Progress Notes (Signed)
Patient ID: Lisa Lee, female   DOB: 1961-03-18, 60 y.o.   MRN: [id number]   This visit type was conducted due to national recommendations for restrictions regarding the COVID-19 pandemic in an effort to limit this patient's exposure and mitigate transmission in our community.   Virtual Visit via Video Note  I connected with Lisa Lee on 03/24/21 at  2:15 PM EDT by a video enabled telemedicine application and verified that I am speaking with the correct person using two identifiers.  Location patient: home Location provider:work or home office Persons participating in the virtual visit: patient, provider  I discussed the limitations of evaluation and management by telemedicine and the availability of in person appointments. The patient expressed understanding and agreed to proceed.   HPI: Lisa Lee relates upper respiratory symptoms with cough which started last Saturday with sore throat.  She went on Tuesday and had COVID testing, strep testing, influenza screen which were all negative.  She has been taking Mucinex DM but has had fairly severe cough.  She has progressive facial pain especially frontal sinuses.  Cough productive of yellow sputum.  She states she feels worse rather than better.  Temperatures been 99 degree range.  Denies any nausea, vomiting, or diarrhea.  Increased malaise.  She also relates bilateral eyes symptoms of crusting and drainage.  Her granddaughter had conjunctivitis recently.   ROS: See pertinent positives and negatives per HPI.  Past Medical History:  Diagnosis Date   Acid reflux    Allergic rhinitis    Asthma    Depression    Diarrhea 08/09/2011   Poss ibs  Vs other  Related to stress  consdier other eval if needed. Had colonoscoopy per dr Matthias Hughs    Hemorrhoids    High cholesterol    HPV in female 1984   Hypothyroidism    Medication side effect 11/01/2011   tussionex   distrubed sleep    Thyroid disease     Past Surgical History:   Procedure Laterality Date   CERVICAL CONIZATION W/BX N/A 07/21/2015   Procedure: CONIZATION CERVIX WITH BIOPSY;  Surgeon: Kirkland Hun, MD;  Location: [facility];  Service: Gynecology;  Laterality: N/A;   CERVICAL CONIZATION W/BX N/A 09/25/2018   Procedure: CONIZATION CERVIX WITH BIOPSY;  Surgeon: Adolphus Birchwood, MD;  Location: [facility];  Service: Gynecology;  Laterality: N/A;   CERVICAL CRYOTHERAPY  1984   COLPOSCOPY  08/26/13   DILATION AND CURETTAGE, DIAGNOSTIC / THERAPEUTIC  1994   Blighted Ovum   HYSTEROSCOPY     uterine polypectomy Jan 7th   HYSTEROSCOPY WITH RESECTOSCOPE  08/04/2009   Removed polyp & IUD   LYMPH NODE BIOPSY     TONSILLECTOMY      Family History  Problem Relation Age of Onset   Heart disease Sister 74       cabg stent   Arthritis Mother    Heart disease Mother    Heart disease Father    Colon cancer Father    Parkinson's disease Father    Pulmonary embolism Daughter    Heart disease Sister    Thyroid disease Sister    Heart disease Brother    Thyroid disease Daughter    Rectal cancer Neg Hx     SOCIAL HX: Non-smoker   Current Outpatient Medications:    cefdinir (OMNICEF) 300 MG capsule, Take 1 capsule (300 mg total) by mouth 2 (two) times daily., Disp: 20 capsule, Rfl: 0   HYDROcodone bit-homatropine (HYCODAN) 5-1.5 MG/5ML syrup,  Take 5 mLs by mouth every 8 (eight) hours as needed for cough., Disp: 120 mL, Rfl: 0   trimethoprim-polymyxin b (POLYTRIM) ophthalmic solution, Place 2 drops into both eyes every 4 (four) hours., Disp: 10 mL, Rfl: 0   albuterol (PROAIR HFA) 108 (90 Base) MCG/ACT inhaler, Inhale 2 puffs into the lungs every 6 (six) hours as needed for wheezing., Disp: 1 each, Rfl: 1   ALPRAZolam (XANAX) 0.5 MG tablet, Take 1/2 to 1 tablet (0.25-0.5 mg total) by mouth 2 times daily as needed for anxiety., Disp: 30 tablet, Rfl: 1   aspirin EC 81 MG tablet, Take 1 tablet (81 mg total) by mouth daily., Disp: , Rfl:    atorvastatin  (LIPITOR) 10 MG tablet, TAKE 1 TABLET DAILY, Disp: 90 tablet, Rfl: 3   Cholecalciferol (VITAMIN D) 2000 units tablet, Take 2,000 Units by mouth in the morning and at bedtime. , Disp: , Rfl:    doxylamine, Sleep, (UNISOM) 25 MG tablet, Take 25 mg by mouth at bedtime as needed., Disp: , Rfl:    escitalopram (LEXAPRO) 20 MG tablet, Take 1 tablet (20 mg total) by mouth daily., Disp: 90 tablet, Rfl: 1   famotidine (PEPCID) 20 MG tablet, TAKE 1 TABLET AT BEDTIME, Disp: 90 tablet, Rfl: 3   fenofibrate 160 MG tablet, Take 1 tablet (160 mg total) by mouth daily., Disp: 90 tablet, Rfl: 3   Ginkgo Biloba 40 MG TABS, Take 1 tablet by mouth in the morning and at bedtime. (Patient not taking: Reported on 08/25/2020), Disp: , Rfl:    levothyroxine (SYNTHROID) 75 MCG tablet, Take 1 tablet (75 mcg total) by mouth daily before breakfast., Disp: 90 tablet, Rfl: 3   lithium carbonate (LITHOBID) 300 MG CR tablet, Take 1 tablet (300 mg total) by mouth 2 (two) times daily., Disp: 180 tablet, Rfl: 1   loperamide (IMODIUM A-D) 2 MG tablet, Take 2 mg by mouth daily., Disp: , Rfl:    losartan (COZAAR) 25 MG tablet, Take 1 tablet (25 mg total) by mouth daily., Disp: 90 tablet, Rfl: 3   modafinil (PROVIGIL) 200 MG tablet, TAKE HALF TABLET BY MOUTH DAILY, Disp: 30 tablet, Rfl: 0   Multiple Vitamins-Minerals (MULTIVITAMIN WITH MINERALS) tablet, Take 1 tablet by mouth daily., Disp: , Rfl:    pantoprazole (PROTONIX) 40 MG tablet, TAKE 1 TABLET DAILY (HAS AN APPOINTMENT SCHEDULED IN [place]), Disp: 90 tablet, Rfl: 3   valACYclovir (VALTREX) 500 MG tablet, TAKE 1 TABLET DAILY FOR SUPPRESSION AND 1 TABLET TWICE A DAY FOR 3 DAYS FOR OUTBREAK, Disp: 30 tablet, Rfl: 13  EXAM:  VITALS per patient if applicable:  GENERAL: alert, oriented, appears well and in no acute distress  HEENT: atraumatic, conjunttiva clear, no obvious abnormalities on inspection of external nose and ears  NECK: normal movements of the head and neck  LUNGS: on  inspection no signs of respiratory distress, breathing rate appears normal, no obvious gross SOB, gasping or wheezing  CV: no obvious cyanosis  MS: moves all visible extremities without noticeable abnormality  PSYCH/NEURO: pleasant and cooperative, no obvious depression or anxiety, speech and thought processing grossly intact  ASSESSMENT AND PLAN:  Discussed the following assessment and plan:  #1 upper respiratory symptoms with cough.  Suspect she may have acute sinusitis.  She has had some headaches and frontal pressure daily which seems to be progressing.  Cough not controlled with over-the-counter medication such as Mucinex DM. -Omnicef 300 mg twice daily for 10 days -Hycodan cough syrup 1 teaspoon nightly  for severe cough  #2 probable bacterial conjunctivitis bilaterally  -Continue warm compresses several times daily -Polytrim ophthalmic drops 2 drops each eye every 4 hours while awake      I discussed the assessment and treatment plan with the patient. The patient was provided an opportunity to ask questions and all were answered. The patient agreed with the plan and demonstrated an understanding of the instructions.   The patient was advised to call back or seek an in-person evaluation if the symptoms worsen or if the condition fails to improve as anticipated.     Evelena Peat, MD

## 2021-03-29 ENCOUNTER — Telehealth: Payer: Self-pay | Admitting: Internal Medicine

## 2021-03-29 ENCOUNTER — Encounter: Payer: Self-pay | Admitting: Family Medicine

## 2021-03-29 DIAGNOSIS — R058 Other specified cough: Secondary | ICD-10-CM

## 2021-03-29 DIAGNOSIS — R0981 Nasal congestion: Secondary | ICD-10-CM

## 2021-03-29 NOTE — Telephone Encounter (Signed)
Patient called to ask if she could have a RSV test done. 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    Good callback number is [phone]   Please Advise

## 2021-03-30 ENCOUNTER — Other Ambulatory Visit: Payer: Self-pay

## 2021-03-30 ENCOUNTER — Other Ambulatory Visit: Payer: 59

## 2021-03-30 DIAGNOSIS — R058 Other specified cough: Secondary | ICD-10-CM

## 2021-03-30 NOTE — Telephone Encounter (Signed)
We do not usually do RSV testing in adults- as most adults have immunity (from prior infection) or limited severity of disease.   Also, rapid antigen tests in adults are frequently false positive.   PCR testing is more accurate in adults.   That being said, I did order respiratory panel (which includes RSV) if she still wants to test.    Not sure how insurance coverage would be.

## 2021-03-30 NOTE — Telephone Encounter (Signed)
PT called to advise that she is still waiting on word in regards to getting a RSV test done. 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

## 2021-03-30 NOTE — Telephone Encounter (Signed)
I spoke with the pt and a lab appt was scheduled for today.

## 2021-03-30 NOTE — Telephone Encounter (Signed)
She had visit with dr Caryl Never this week.   Ok  I placed order  for the rsv covid testing  Please  arrange for her to get the test  asap  We dont have a rapid test as far as I know

## 2021-04-01 LAB — COVID-19, FLU A+B AND RSV
Influenza A, NAA: NOT DETECTED
Influenza B, NAA: NOT DETECTED
RSV, NAA: NOT DETECTED
SARS-CoV-2, NAA: NOT DETECTED

## 2021-04-01 NOTE — Progress Notes (Signed)
Negative rsv

## 2021-04-04 ENCOUNTER — Ambulatory Visit: Payer: 59 | Admitting: Internal Medicine

## 2021-04-04 ENCOUNTER — Telehealth: Payer: Self-pay

## 2021-04-04 NOTE — Telephone Encounter (Signed)
Noted  

## 2021-04-04 NOTE — Telephone Encounter (Signed)
Patient returned call and was informed of results pt verbalized understanding and stated she read on MyChart.

## 2021-04-05 ENCOUNTER — Ambulatory Visit: Payer: 59 | Admitting: Internal Medicine

## 2021-04-11 ENCOUNTER — Encounter: Payer: Self-pay | Admitting: Internal Medicine

## 2021-04-11 ENCOUNTER — Ambulatory Visit (INDEPENDENT_AMBULATORY_CARE_PROVIDER_SITE_OTHER): Payer: 59 | Admitting: Internal Medicine

## 2021-04-11 ENCOUNTER — Other Ambulatory Visit: Payer: Self-pay

## 2021-04-11 ENCOUNTER — Telehealth: Payer: Self-pay | Admitting: Internal Medicine

## 2021-04-11 VITALS — BP 120/66 | HR 68 | Temp 98.5°F | Ht 60.5 in | Wt 131.4 lb

## 2021-04-11 DIAGNOSIS — Z79899 Other long term (current) drug therapy: Secondary | ICD-10-CM | POA: Diagnosis not present

## 2021-04-11 DIAGNOSIS — E559 Vitamin D deficiency, unspecified: Secondary | ICD-10-CM | POA: Diagnosis not present

## 2021-04-11 DIAGNOSIS — Z0000 Encounter for general adult medical examination without abnormal findings: Secondary | ICD-10-CM

## 2021-04-11 DIAGNOSIS — E785 Hyperlipidemia, unspecified: Secondary | ICD-10-CM

## 2021-04-11 DIAGNOSIS — Z23 Encounter for immunization: Secondary | ICD-10-CM

## 2021-04-11 DIAGNOSIS — R7301 Impaired fasting glucose: Secondary | ICD-10-CM

## 2021-04-11 DIAGNOSIS — E039 Hypothyroidism, unspecified: Secondary | ICD-10-CM

## 2021-04-11 LAB — CBC WITH DIFFERENTIAL/PLATELET
Basophils Absolute: 0.1 10*3/uL (ref 0.0–0.1)
Basophils Relative: 0.8 % (ref 0.0–3.0)
Eosinophils Absolute: 0.5 10*3/uL (ref 0.0–0.7)
Eosinophils Relative: 4 % (ref 0.0–5.0)
HCT: 36.5 % (ref 36.0–46.0)
Hemoglobin: 11.9 g/dL — ABNORMAL LOW (ref 12.0–15.0)
Lymphocytes Relative: 18 % (ref 12.0–46.0)
Lymphs Abs: 2.1 10*3/uL (ref 0.7–4.0)
MCHC: 32.7 g/dL (ref 30.0–36.0)
MCV: 91.4 fl (ref 78.0–100.0)
Monocytes Absolute: 0.9 10*3/uL (ref 0.1–1.0)
Monocytes Relative: 7.6 % (ref 3.0–12.0)
Neutro Abs: 8.2 10*3/uL — ABNORMAL HIGH (ref 1.4–7.7)
Neutrophils Relative %: 69.6 % (ref 43.0–77.0)
Platelets: 475 10*3/uL — ABNORMAL HIGH (ref 150.0–400.0)
RBC: 4 Mil/uL (ref 3.87–5.11)
RDW: 12.7 % (ref 11.5–15.5)
WBC: 11.7 10*3/uL — ABNORMAL HIGH (ref 4.0–10.5)

## 2021-04-11 LAB — LIPID PANEL
Cholesterol: 159 mg/dL (ref 0–200)
HDL: 52.6 mg/dL (ref >39.00)
LDL Cholesterol: 88 mg/dL (ref 0–99)
NonHDL: 106.38
Total CHOL/HDL Ratio: 3
Triglycerides: 94 mg/dL (ref 0.0–149.0)
VLDL: 18.8 mg/dL (ref 0.0–40.0)

## 2021-04-11 LAB — HEPATIC FUNCTION PANEL
ALT: 24 U/L (ref 0–35)
AST: 23 U/L (ref 0–37)
Albumin: 4.4 g/dL (ref 3.5–5.2)
Alkaline Phosphatase: 55 U/L (ref 39–117)
Bilirubin, Direct: 0.1 mg/dL (ref 0.0–0.3)
Total Bilirubin: 0.5 mg/dL (ref 0.2–1.2)
Total Protein: 7.7 g/dL (ref 6.0–8.3)

## 2021-04-11 LAB — BASIC METABOLIC PANEL
BUN: 31 mg/dL — ABNORMAL HIGH (ref 6–23)
CO2: 24 mEq/L (ref 19–32)
Calcium: 10.5 mg/dL (ref 8.4–10.5)
Chloride: 104 mEq/L (ref 96–112)
Creatinine, Ser: 1.05 mg/dL (ref 0.40–1.20)
GFR: 57.37 mL/min — ABNORMAL LOW (ref >60.00)
Glucose, Bld: 79 mg/dL (ref 70–99)
Potassium: 4.2 mEq/L (ref 3.5–5.1)
Sodium: 136 mEq/L (ref 135–145)

## 2021-04-11 LAB — TSH: TSH: 3.23 u[IU]/mL (ref 0.35–5.50)

## 2021-04-11 LAB — HEMOGLOBIN A1C: Hgb A1c MFr Bld: 6 % (ref 4.6–6.5)

## 2021-04-11 MED ORDER — HYDROCODONE BIT-HOMATROP MBR 5-1.5 MG/5ML PO SOLN: 5.0000 mL | Freq: Three times a day (TID) | ORAL | 0 refills | Status: DC | PRN

## 2021-04-11 NOTE — Patient Instructions (Addendum)
Good to see you today .   Lungs exam no wheezing today .  Will refill cough med.   Will notify you  of labs when available.   Keep [place] informed .   Continue lifestyle intervention healthy eating and exercise .   Health Maintenance, Female Adopting a healthy lifestyle and getting preventive care are important in promoting health and wellness. Ask your health care provider about: The right schedule for you to have regular tests and exams. Things you can do on your own to prevent diseases and keep yourself healthy. What should I know about diet, weight, and exercise? Eat a healthy diet  Eat a diet that includes plenty of vegetables, fruits, low-fat dairy products, and lean protein. Do not eat a lot of foods that are high in solid fats, added sugars, or sodium. Maintain a healthy weight Body mass index (BMI) is used to identify weight problems. It estimates body fat based on height and weight. Your health care provider can help determine your BMI and help you achieve or maintain a healthy weight. Get regular exercise Get regular exercise. This is one of the most important things you can do for your health. Most adults should: Exercise for at least 150 minutes each week. The exercise should increase your heart rate and make you sweat (moderate-intensity exercise). Do strengthening exercises at least twice a week. This is in addition to the moderate-intensity exercise. Spend less time sitting. Even light physical activity can be beneficial. Watch cholesterol and blood lipids Have your blood tested for lipids and cholesterol at 61 years of age, then have this test every 5 years. Have your cholesterol levels checked more often if: Your lipid or cholesterol levels are high. You are older than 61 years of age. You are at high risk for heart disease. What should I know about cancer screening? Depending on your health history and family history, you may need to have cancer screening at various  ages. This may include screening for: Breast cancer. Cervical cancer. Colorectal cancer. Skin cancer. Lung cancer. What should I know about heart disease, diabetes, and high blood pressure? Blood pressure and heart disease High blood pressure causes heart disease and increases the risk of stroke. This is more likely to develop in people who have high blood pressure readings, are of African descent, or are overweight. Have your blood pressure checked: Every 3-5 years if you are 41-48 years of age. Every year if you are 41 years old or older. Diabetes Have regular diabetes screenings. This checks your fasting blood sugar level. Have the screening done: Once every three years after age 7 if you are at a normal weight and have a low risk for diabetes. More often and at a younger age if you are overweight or have a high risk for diabetes. What should I know about preventing infection? Hepatitis B If you have a higher risk for hepatitis B, you should be screened for this virus. Talk with your health care provider to find out if you are at risk for hepatitis B infection. Hepatitis C Testing is recommended for: Everyone born from [birth year] through 1966. Anyone with known risk factors for hepatitis C. Sexually transmitted infections (STIs) Get screened for STIs, including gonorrhea and chlamydia, if: You are sexually active and are younger than 61 years of age. You are older than 61 years of age and your health care provider tells you that you are at risk for this type of infection. Your sexual activity has changed since  you were last screened, and you are at increased risk for chlamydia or gonorrhea. Ask your health care provider if you are at risk. Ask your health care provider about whether you are at high risk for HIV. Your health care provider may recommend a prescription medicine to help prevent HIV infection. If you choose to take medicine to prevent HIV, you should first get tested for HIV.  You should then be tested every 3 months for as long as you are taking the medicine. Pregnancy If you are about to stop having your period (premenopausal) and you may become pregnant, seek counseling before you get pregnant. Take 400 to 800 micrograms (mcg) of folic acid every day if you become pregnant. Ask for birth control (contraception) if you want to prevent pregnancy. Osteoporosis and menopause Osteoporosis is a disease in which the bones lose minerals and strength with aging. This can result in bone fractures. If you are 81 years old or older, or if you are at risk for osteoporosis and fractures, ask your health care provider if you should: Be screened for bone loss. Take a calcium or vitamin D supplement to lower your risk of fractures. Be given hormone replacement therapy (HRT) to treat symptoms of menopause. Follow these instructions at home: Lifestyle Do not use any products that contain nicotine or tobacco, such as cigarettes, e-cigarettes, and chewing tobacco. If you need help quitting, ask your health care provider. Do not use street drugs. Do not share needles. Ask your health care provider for help if you need support or information about quitting drugs. Alcohol use Do not drink alcohol if: Your health care provider tells you not to drink. You are pregnant, may be pregnant, or are planning to become pregnant. If you drink alcohol: Limit how much you use to 0-1 drink a day. Limit intake if you are breastfeeding. Be aware of how much alcohol is in your drink. In the [place] one drink equals one 12 oz bottle of beer (355 mL), one 5 oz glass of wine (148 mL), or one 1 oz glass of hard liquor (44 mL). General instructions Schedule regular health, dental, and eye exams. Stay current with your vaccines. Tell your health care provider if: You often feel depressed. You have ever been abused or do not feel safe at home. Summary Adopting a healthy lifestyle and getting preventive  care are important in promoting health and wellness. Follow your health care provider's instructions about healthy diet, exercising, and getting tested or screened for diseases. Follow your health care provider's instructions on monitoring your cholesterol and blood pressure. This information is not intended to replace advice given to you by your health care provider. Make sure you discuss any questions you have with your health care provider. Document Revised: 09/23/2020 Document Reviewed: 07/09/2018 Elsevier Patient Education  2022 [employer].

## 2021-04-11 NOTE — Progress Notes (Signed)
Chief Complaint  Patient presents with   Annual Exam    HPI: Patient  Lisa [facility]  60 y.o. 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     Her Patient had rsv.   But her test was negative  getting btter but  spasm of cough   no wheezing now  asks for refill hydrocodone in case  no sob fever. [facility] prev notes  neg  covid pcr   Teams cards   lipid therapy to [facility] dr [facility] next  Dr [facility] in past  Respas above Thyroid no change  Psych on lithium rare [place] [facility] asks for  level .   May have had tremor from med  PPI  qd when stops revound heartburn not remembering initial rx  but may have been for quieting cough and hb    Health Maintenance  Topic Date Due   Zoster Vaccines- Shingrix (1 of 2) Never done   Pneumococcal Vaccine 53-53 Years old (2 - PPSV23 or PCV20) 10/08/2014   MAMMOGRAM  05/30/2018   TETANUS/TDAP  02/21/2021   COVID-19 Vaccine (5 - Booster for Pfizer series) 03/12/2021   PAP SMEAR-Modifier  11/17/2022   COLONOSCOPY (Pts 45-48yrs Insurance coverage will need to be confirmed)  07/05/2023   INFLUENZA VACCINE  Completed   Hepatitis C Screening  Completed   HIV Screening  Completed   HPV VACCINES  Aged Out   Health Maintenance Review LIFESTYLE:  Exercise:  pre illness  yes .  For weight control and intermittent fasting.  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 Had colonoscoopy per dr Lisa [facility]    Hemorrhoids    High cholesterol    HPV in female 1984   Hypothyroidism    Medication side effect 11/01/2011   tussionex   distrubed sleep     Thyroid disease     Past Surgical History:  Procedure Laterality Date   CERVICAL CONIZATION W/BX N/A 07/21/2015   Procedure: CONIZATION CERVIX WITH BIOPSY;  Surgeon: Lisa Hun, MD;  Location: [facility];  Service: Gynecology;  Laterality: N/A;   CERVICAL CONIZATION W/BX N/A 09/25/2018   Procedure: CONIZATION CERVIX WITH BIOPSY;  Surgeon: Lisa Birchwood, MD;  Location: [facility];  Service: Gynecology;  Laterality: N/A;   CERVICAL CRYOTHERAPY  1984   COLPOSCOPY  08/26/13   DILATION AND CURETTAGE, DIAGNOSTIC / THERAPEUTIC  1994   Blighted Ovum   HYSTEROSCOPY     uterine polypectomy Jan 7th   HYSTEROSCOPY WITH RESECTOSCOPE  08/04/2009   Removed polyp & IUD   LYMPH NODE BIOPSY     TONSILLECTOMY      Family History  Problem Relation Age of Onset   Heart disease Sister 64       cabg stent   Arthritis Mother    Heart disease Mother    Heart disease Father    Colon cancer Father    Parkinson's disease Father  Pulmonary embolism Daughter    Heart disease Sister    Thyroid disease Sister    Heart disease Brother    Thyroid disease Daughter    Rectal cancer Neg Hx     Social History   Socioeconomic History   Marital status: Married    Spouse name: Not on file   Number of children: Not on file   Years of education: Not on file   Highest education level: Not on file  Occupational History   Not on file  Tobacco Use   Smoking status: Never   Smokeless tobacco: Never  Vaping Use   Vaping Use: Never used  Substance and Sexual Activity   Alcohol use: Yes    Alcohol/week: 0.0 standard drinks    Comment: monthly ocassional drink   Drug use: No   Sexual activity: Yes    Birth control/protection: None  Other Topics Concern   Not on file  Social History Narrative    [employer]. 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 1 each 1   ALPRAZolam (XANAX) 0.5 MG tablet Take 1/2 to 1 tablet (0.25-0.5 mg total) by mouth 2 times daily as needed for anxiety. 30 tablet 1   aspirin EC 81 MG tablet Take 1 tablet (81 mg total) by mouth daily.     atorvastatin (LIPITOR) 10 MG tablet TAKE 1 TABLET DAILY 90 tablet 3   Cholecalciferol (VITAMIN D) 2000 units tablet Take 4,000 Units by mouth in the morning and at bedtime.     doxylamine, Sleep, (UNISOM) 25 MG tablet Take 25 mg by mouth at bedtime as needed.     escitalopram (LEXAPRO) 20 MG tablet Take 1 tablet (20 mg total) by mouth daily. 90 tablet 1   famotidine (PEPCID) 20 MG tablet TAKE 1 TABLET AT BEDTIME 90 tablet 3   fenofibrate 160 MG tablet Take 1 tablet (160 mg total) by mouth daily. 90 tablet 3   Ginkgo Biloba 40 MG TABS Take 1 tablet by mouth in the morning and at bedtime.     levothyroxine (SYNTHROID) 75 MCG tablet Take 1 tablet (75 mcg total) by mouth daily before breakfast. 90 tablet 3   lithium carbonate (LITHOBID) 300 MG CR tablet Take 1 tablet (300 mg total) by mouth 2 (two) times daily. 180 tablet 1   loperamide (IMODIUM A-D) 2 MG tablet Take 2 mg by mouth daily.     modafinil (PROVIGIL) 200 MG tablet TAKE HALF TABLET BY MOUTH DAILY 30 tablet 0   Multiple Vitamins-Minerals (MULTIVITAMIN WITH MINERALS) tablet Take 1 tablet by mouth daily.     pantoprazole (PROTONIX) 40 MG tablet TAKE 1 TABLET DAILY (HAS AN APPOINTMENT SCHEDULED IN AUGUST) 90 tablet 3   valACYclovir (VALTREX) 500 MG tablet TAKE 1 TABLET DAILY FOR  SUPPRESSION AND  1 TABLET TWICE A DAY FOR 3 DAYS FOR OUTBREAK 30 tablet 13   HYDROcodone bit-homatropine (HYCODAN) 5-1.5 MG/5ML syrup Take 5 mLs by mouth every 8 (eight) hours as needed for cough. 120 mL 0   cefdinir (OMNICEF) 300 MG capsule Take 1 capsule (300 mg total) by mouth 2 (two) times daily. 20 capsule 0   losartan (COZAAR) 25 MG tablet Take 1 tablet (25 mg total) by mouth daily. 90 tablet 3   trimethoprim-polymyxin b (POLYTRIM) ophthalmic solution Place 2 drops into both eyes every 4 (four) hours. 10 mL 0   No facility-administered medications prior to visit.     EXAM:  BP 120/66 (BP Location: Left Arm, Patient Position: Sitting, Cuff Size: Normal)   Pulse 68   Temp 98.5 F (36.9 C) (Oral)   Ht 5' 0.5" (1.537 m)   Wt 131 lb 6.4 oz (59.6 kg)   LMP 12/09/2013   SpO2 97%   BMI 25.24 kg/m   Body mass index is 25.24 kg/m. Wt Readings from Last 3 Encounters:  04/11/21 131 lb 6.4 oz (59.6 kg)  05/26/20 137 lb (62.1 kg)  05/03/20 138 lb (62.6 kg)    Physical Exam: Vital signs reviewed ZOX:WRUE is a well-developed well-nourished alert cooperative    who appearsr stated age in no acute distress.  HEENT: normocephalic atraumatic , Eyes: PERRL EOM's full, conjunctiva clear, Nares: paten,t no deformity discharge or tenderness., Ears: no deformity EAC's clear TMs with normal landmarks. Mouth: masked  mild hoardse ocass cough dry NECK: supple without masses, thyromegaly or bruits. CHEST/PULM:  Clear to auscultation and percussion breath sounds equal no wheeze , rales or rhonchi. No chest wall deformities or tenderness. Breast: normal by inspection . No dimpling, discharge, masses, tenderness or discharge . CV: PMI is nondisplaced, S1 S2 no gallops, murmurs, rubs. Peripheral pulses are full without delay.No JVD .  ABDOMEN: Bowel sounds normal nontender  No guard or rebound, no hepato splenomegal no CVA tenderness.   Extremtities:  No clubbing cyanosis or edema, no acute joint  swelling or redness no focal atrophy NEURO:  Oriented x3, cranial nerves 3-12 appear to be intact, no obvious focal weakness,gait within normal limits no abnormal reflexes or asymmetrical SKIN: No acute rashes normal turgor, color, no bruising or petechiae. PSYCH: Oriented, good eye contact, no obvious depression anxiety, cognition and judgment appear normal. LN: no cervical axillary inguinal adenopathy   Lab Results  Component Value Date   WBC 10.2 03/29/2020   HGB 12.4 03/29/2020   HCT 36.6 03/29/2020   PLT 366 03/29/2020   GLUCOSE 90 09/08/2020   CHOL 174 03/29/2020   TRIG 104 03/29/2020   HDL 59 03/29/2020   LDLDIRECT 101.0 10/30/2016   LDLCALC 95 03/29/2020   ALT 23 03/29/2020   AST 20 03/29/2020   NA 140 09/08/2020   K 4.4 09/08/2020   CL 106 09/08/2020   CREATININE 1.05 09/08/2020   BUN 27 (H) 09/08/2020   CO2 25 09/08/2020   TSH 3.31 03/29/2020   HGBA1C 5.2 03/29/2020    BP Readings from Last 3 Encounters:  04/11/21 120/66  05/26/20 118/70  05/03/20 125/67    Lab plan  reviewed with patient   ASSESSMENT AND PLAN:  Discussed the following assessment and plan:    ICD-10-CM   1. Visit for preventive health examination  Z00.00 Basic metabolic panel    Hepatic function panel    Lipid panel    TSH    CBC with Differential/Platelet    Hemoglobin  A1c    Hemoglobin A1c    CBC with Differential/Platelet    TSH    Lipid panel    Hepatic function panel    Basic metabolic panel    2. Hypothyroidism, unspecified type  E03.9 Basic metabolic panel    Hepatic function panel    Lipid panel    TSH    CBC with Differential/Platelet    Hemoglobin A1c    Hemoglobin A1c    CBC with Differential/Platelet    TSH    Lipid panel    Hepatic function panel    Basic metabolic panel    3. Hyperlipidemia, unspecified hyperlipidemia type  E78.5 Basic metabolic panel    Hepatic function panel    Lipid panel    TSH    CBC with Differential/Platelet    Hemoglobin A1c     Hemoglobin A1c    CBC with Differential/Platelet    TSH    Lipid panel    Hepatic function panel    Basic metabolic panel    4. Medication management  Z79.899 Basic metabolic panel    Hepatic function panel    Lipid panel    TSH    CBC with Differential/Platelet    Hemoglobin A1c    Hemoglobin A1c    CBC with Differential/Platelet    TSH    Lipid panel    Hepatic function panel    Basic metabolic panel    Lithium level    Lithium level    5. Lithium use  Z79.899 Basic metabolic panel    Hepatic function panel    Lipid panel    TSH    CBC with Differential/Platelet    Hemoglobin A1c    Hemoglobin A1c    CBC with Differential/Platelet    TSH    Lipid panel    Hepatic function panel    Basic metabolic panel    Lithium level    Lithium level    6. Vitamin D deficiency, unspecified  E55.9 Basic metabolic panel    Hepatic function panel    Lipid panel    TSH    CBC with Differential/Platelet    Hemoglobin A1c    Hemoglobin A1c    CBC with Differential/Platelet    TSH    Lipid panel    Hepatic function panel    Basic metabolic panel    7. Fasting hyperglycemia  R73.01 Basic metabolic panel    Hepatic function panel    Lipid panel    TSH    CBC with Differential/Platelet    Hemoglobin A1c    Hemoglobin A1c    CBC with Differential/Platelet    TSH    Lipid panel    Hepatic function panel    Basic metabolic panel    8. Need for influenza vaccination  Z23 Flu Vaccine QUAD 6+ mos PF IM (Fluarix Quad PF)    Lab monitoring  Can try qo2or qo3 days with pepcid bid wean as tolerated for ppi Refill hydrocodone cough med today if needed.    Agree sure acts like rsv  recovery .  Return in about 1 year (around 04/11/2022) for depending on results.  Patient Care Team: Zakirah Weingart, Neta Mends, MD as PCP - Veto Kemps, MD (Ophthalmology) Bernette Redbird, MD as Consulting Physician (Gastroenterology) Tamela Oddi, Faustino Congress, MD (Inactive) as Consulting  Physician (Cardiology) Huel Cote, MD as Consulting Physician (Obstetrics and Gynecology) Patient Instructions  Good to [facility] you today .   Lungs exam no wheezing  today .  Will refill cough med.   Will notify you  of labs when available.   Keep [place] informed .   Continue lifestyle intervention healthy eating and exercise .   Health Maintenance, Female Adopting a healthy lifestyle and getting preventive care are important in promoting health and wellness. Ask your health care provider about: The right schedule for you to have regular tests and exams. Things you can do on your own to prevent diseases and keep yourself healthy. What should I know about diet, weight, and exercise? Eat a healthy diet  Eat a diet that includes plenty of vegetables, fruits, low-fat dairy products, and lean protein. Do not eat a lot of foods that are high in solid fats, added sugars, or sodium. Maintain a healthy weight Body mass index (BMI) is used to identify weight problems. It estimates body fat based on height and weight. Your health care provider can help determine your BMI and help you achieve or maintain a healthy weight. Get regular exercise Get regular exercise. This is one of the most important things you can do for your health. Most adults should: Exercise for at least 150 minutes each week. The exercise should increase your heart rate and make you sweat (moderate-intensity exercise). Do strengthening exercises at least twice a week. This is in addition to the moderate-intensity exercise. Spend less time sitting. Even light physical activity can be beneficial. Watch cholesterol and blood lipids Have your blood tested for lipids and cholesterol at 61 years of age, then have this test every 5 years. Have your cholesterol levels checked more often if: Your lipid or cholesterol levels are high. You are older than 61 years of age. You are at high risk for heart disease. What should I know about  cancer screening? Depending on your health history and family history, you may need to have cancer screening at various ages. This may include screening for: Breast cancer. Cervical cancer. Colorectal cancer. Skin cancer. Lung cancer. What should I know about heart disease, diabetes, and high blood pressure? Blood pressure and heart disease High blood pressure causes heart disease and increases the risk of stroke. This is more likely to develop in people who have high blood pressure readings, are of African descent, or are overweight. Have your blood pressure checked: Every 3-5 years if you are 32-66 years of age. Every year if you are 7 years old or older. Diabetes Have regular diabetes screenings. This checks your fasting blood sugar level. Have the screening done: Once every three years after age 29 if you are at a normal weight and have a low risk for diabetes. More often and at a younger age if you are overweight or have a high risk for diabetes. What should I know about preventing infection? Hepatitis B If you have a higher risk for hepatitis B, you should be screened for this virus. Talk with your health care provider to find out if you are at risk for hepatitis B infection. Hepatitis C Testing is recommended for: Everyone born from [birth year] through 1966. Anyone with known risk factors for hepatitis C. Sexually transmitted infections (STIs) Get screened for STIs, including gonorrhea and chlamydia, if: You are sexually active and are younger than 61 years of age. You are older than 61 years of age and your health care provider tells you that you are at risk for this type of infection. Your sexual activity has changed since you were last screened, and you are at increased risk for chlamydia  or gonorrhea. Ask your health care provider if you are at risk. Ask your health care provider about whether you are at high risk for HIV. Your health care provider may recommend a prescription  medicine to help prevent HIV infection. If you choose to take medicine to prevent HIV, you should first get tested for HIV. You should then be tested every 3 months for as long as you are taking the medicine. Pregnancy If you are about to stop having your period (premenopausal) and you may become pregnant, seek counseling before you get pregnant. Take 400 to 800 micrograms (mcg) of folic acid every day if you become pregnant. Ask for birth control (contraception) if you want to prevent pregnancy. Osteoporosis and menopause Osteoporosis is a disease in which the bones lose minerals and strength with aging. This can result in bone fractures. If you are 24 years old or older, or if you are at risk for osteoporosis and fractures, ask your health care provider if you should: Be screened for bone loss. Take a calcium or vitamin D supplement to lower your risk of fractures. Be given hormone replacement therapy (HRT) to treat symptoms of menopause. Follow these instructions at home: Lifestyle Do not use any products that contain nicotine or tobacco, such as cigarettes, e-cigarettes, and chewing tobacco. If you need help quitting, ask your health care provider. Do not use street drugs. Do not share needles. Ask your health care provider for help if you need support or information about quitting drugs. Alcohol use Do not drink alcohol if: Your health care provider tells you not to drink. You are pregnant, may be pregnant, or are planning to become pregnant. If you drink alcohol: Limit how much you use to 0-1 drink a day. Limit intake if you are breastfeeding. Be aware of how much alcohol is in your drink. In the [place] one drink equals one 12 oz bottle of beer (355 mL), one 5 oz glass of wine (148 mL), or one 1 oz glass of hard liquor (44 mL). General instructions Schedule regular health, dental, and eye exams. Stay current with your vaccines. Tell your health care provider if: You often feel  depressed. You have ever been abused or do not feel safe at home. Summary Adopting a healthy lifestyle and getting preventive care are important in promoting health and wellness. Follow your health care provider's instructions about healthy diet, exercising, and getting tested or screened for diseases. Follow your health care provider's instructions on monitoring your cholesterol and blood pressure. This information is not intended to replace advice given to you by your health care provider. Make sure you discuss any questions you have with your health care provider. Document Revised: 09/23/2020 Document Reviewed: 07/09/2018 Elsevier Patient Education  2022 [employer].  [place]. Hobart Marte M.D.

## 2021-04-11 NOTE — Progress Notes (Signed)
Labs in range  or stable except  cbc mildly elevated wbc that is consistent with  hcurrent infection .  Lithium level pending  .  Hg a1c   up in prediabetic range but   fastin g levels are normal , no diabetes . Continue lifestyle intervention healthy eating and exercise when better .

## 2021-04-11 NOTE — Telephone Encounter (Signed)
Pt is returning your call and want you to gave her a call back.

## 2021-04-12 ENCOUNTER — Other Ambulatory Visit: Payer: Self-pay | Admitting: Physician Assistant

## 2021-04-12 LAB — LITHIUM LEVEL: Lithium Lvl: 0.8 mmol/L (ref 0.6–1.2)

## 2021-04-12 NOTE — Progress Notes (Signed)
Lithium level  in range

## 2021-04-12 NOTE — Telephone Encounter (Signed)
Please see result note 

## 2021-04-13 NOTE — Telephone Encounter (Signed)
[name] called because she is leaving on vacation as soon as she gets this refill.  DO NOT send back to [facility].  Please send the prescription to her local pharmacy - [facility] on [address].  Please send ASAP so she leave.  Appt 9/29

## 2021-04-14 ENCOUNTER — Other Ambulatory Visit: Payer: Self-pay

## 2021-04-14 MED ORDER — LITHIUM CARBONATE ER 300 MG PO TBCR: 300.0000 mg | EXTENDED_RELEASE_TABLET | Freq: Two times a day (BID) | ORAL | 1 refills | Status: DC

## 2021-04-14 NOTE — Telephone Encounter (Signed)
Noted. Rx sent to [facility] instead

## 2021-04-27 ENCOUNTER — Other Ambulatory Visit: Payer: Self-pay

## 2021-04-27 ENCOUNTER — Encounter: Payer: Self-pay | Admitting: Physician Assistant

## 2021-04-27 ENCOUNTER — Ambulatory Visit: Payer: 59 | Admitting: Physician Assistant

## 2021-04-27 DIAGNOSIS — F319 Bipolar disorder, unspecified: Secondary | ICD-10-CM | POA: Diagnosis not present

## 2021-04-27 DIAGNOSIS — F411 Generalized anxiety disorder: Secondary | ICD-10-CM

## 2021-04-27 DIAGNOSIS — G4700 Insomnia, unspecified: Secondary | ICD-10-CM

## 2021-04-27 NOTE — Patient Instructions (Signed)
Wean off Lithium by taking 1 pill daily for 1 week and then stop.

## 2021-04-27 NOTE — Progress Notes (Signed)
[facility]  Patient ID: Lisa Lee,  MRN: [id number]  PCP: Madelin Headings, MD  Date of Evaluation: 04/27/2021 Time spent:45 minutes  Chief Complaint:  Chief Complaint   Anxiety; Depression; Insomnia; Follow-up     HISTORY/CURRENT STATUS: For routine med check.  Hasn't been seen since 1/22, due to my absence from illness.  Still has mild tremor of both hands, doesn't affect her work. Also has tic of moving her mouth to the left. Wonders if it might be good to try something else, get off the lithium.   Not having manic sx although does get impulsive when angry, esp at husband. Irritable too. Most of the time, it's only when triggered. No increased energy, dec need for sleep, does spend more at times, not paranoia or hallucinations.   If she has major mood swings, it's more toward depression than mania. Sx aren't a prob now. Able to work w/o issues. Modafanil helps energy. Anxiety is controlled, doesn't take Xanax often. Sleeps ok, use Unisom and Melatonin prn. Doesn't cry easily. No SI/HI.  Denies dizziness, syncope, seizures, numbness, tingling, unsteady gait, slurred speech, confusion.  The tremor is almost completely gone.  Denies muscle or joint pain, stiffness, or dystonia.  Individual Medical History/ Review of Systems: Changes? :Yes    Had probable RSV 6 weeks ago, still coughing. Is on Hycodan cough syrup, usually only takes it at night.    Has lost some weight and BP went too low, was taken off BP meds.   Past medications for mental health diagnoses include: Lexapro, lithium, Xanax, Modafanil  Allergies: Penicillins, Pravastatin, and Rosuvastatin  Current Medications:  Current Outpatient Medications:    albuterol (PROAIR HFA) 108 (90 Base) MCG/ACT inhaler, Inhale 2 puffs into the lungs every 6 (six) hours as needed for wheezing., Disp: 1 each, Rfl: 1   ALPRAZolam (XANAX) 0.5 MG tablet, Take 1/2 to 1 tablet (0.25-0.5 mg total) by mouth 2 times  daily as needed for anxiety., Disp: 30 tablet, Rfl: 1   aspirin EC 81 MG tablet, Take 1 tablet (81 mg total) by mouth daily., Disp: , Rfl:    atorvastatin (LIPITOR) 10 MG tablet, TAKE 1 TABLET DAILY, Disp: 90 tablet, Rfl: 3   Cholecalciferol (VITAMIN D) 2000 units tablet, Take 4,000 Units by mouth in the morning and at bedtime., Disp: , Rfl:    doxylamine, Sleep, (UNISOM) 25 MG tablet, Take 25 mg by mouth at bedtime as needed., Disp: , Rfl:    escitalopram (LEXAPRO) 20 MG tablet, Take 1 tablet (20 mg total) by mouth daily., Disp: 90 tablet, Rfl: 1   famotidine (PEPCID) 20 MG tablet, TAKE 1 TABLET AT BEDTIME, Disp: 90 tablet, Rfl: 3   fenofibrate 160 MG tablet, Take 1 tablet (160 mg total) by mouth daily., Disp: 90 tablet, Rfl: 3   Ginkgo Biloba 40 MG TABS, Take 1 tablet by mouth in the morning and at bedtime., Disp: , Rfl:    HYDROcodone bit-homatropine (HYCODAN) 5-1.5 MG/5ML syrup, Take 5 mLs by mouth every 8 (eight) hours as needed for cough., Disp: 120 mL, Rfl: 0   levothyroxine (SYNTHROID) 75 MCG tablet, Take 1 tablet (75 mcg total) by mouth daily before breakfast., Disp: 90 tablet, Rfl: 3   lithium carbonate (LITHOBID) 300 MG CR tablet, Take 1 tablet (300 mg total) by mouth 2 (two) times daily., Disp: 180 tablet, Rfl: 1   loperamide (IMODIUM A-D) 2 MG tablet, Take 2 mg by mouth daily., Disp: , Rfl:  Melatonin 3 MG CAPS, Take by mouth., Disp: , Rfl:    modafinil (PROVIGIL) 200 MG tablet, TAKE HALF TABLET BY MOUTH DAILY, Disp: 30 tablet, Rfl: 0   Multiple Vitamins-Minerals (MULTIVITAMIN WITH MINERALS) tablet, Take 1 tablet by mouth daily., Disp: , Rfl:    pantoprazole (PROTONIX) 40 MG tablet, TAKE 1 TABLET DAILY (HAS AN APPOINTMENT SCHEDULED IN AUGUST), Disp: 90 tablet, Rfl: 3   valACYclovir (VALTREX) 500 MG tablet, TAKE 1 TABLET DAILY FOR SUPPRESSION AND 1 TABLET TWICE A DAY FOR 3 DAYS FOR OUTBREAK, Disp: 30 tablet, Rfl: 13 Medication Side Effects: none  Family Medical/ Social History:  Changes? No  MENTAL HEALTH EXAM:  Last menstrual period 12/09/2013.There is no height or weight on file to calculate BMI.  General Appearance: Casual and Well Groomed  Eye Contact:  Good  Speech:  Clear and Coherent and Normal Rate  Volume:  Normal  Mood:  Euthymic  Affect:  Congruent  Thought Process:  Goal Directed and Descriptions of Associations: Intact  Orientation:  Full (Time, Place, and Person)  Thought Content: Logical   Suicidal Thoughts:  No  Homicidal Thoughts:  No  Memory:  WNL  Judgement:  Good  Insight:  Good  Psychomotor Activity:  Tremor and mild in hands only  Concentration:  Concentration: Good and Attention Span: Good  Recall:  Good  Fund of Knowledge: Good  Language: Good  Assets:  Desire for Improvement  ADL's:  Intact  Cognition: WNL  Prognosis:  Good   Labs 04/11/2021 Lithium level 0.8 CMP sodium 136 creatinine 1.05 BUN 31, glucose 79, LFTs were completely normal Hemoglobin A1c 6.0 CBC with differential, white blood count 11.7, hemoglobin 11.9, platelets 475 Lipid panel completely normal TSH 3.23  DIAGNOSES:    ICD-10-CM   1. Bipolar I disorder (HCC)  F31.9     2. Generalized anxiety disorder  F41.1     3. Insomnia, unspecified type  G47.00        Receiving Psychotherapy: No    RECOMMENDATIONS:  PDMP was reviewed. Last Modafanil filled 12/22/2020.  Xanax filled 09/19/2020.  Was given Hycodan elixir 03/24/2021 and 04/11/2021. I provided 45 minutes of face to face time during this encounter, including time spent before and after the visit in records review, medical decision making, counseling pertinent to today's visit, and charting.  Disc going off Li. She would like to try it to see if tremor and facial tic resolve.  Disc AAP. Pt concerned about wt gain, TD, and other SE from AAP. Newer AAPs don't have quite the same risk of wt gain, increasing glu and lipids. Latuda, Rexulti, Vraylar, Caplyta are all ones I'd recommend. Of course ins may not  cover since she's never tried an AAP before. I recommend Latuda, it's going generic at beginning of year, I think. Start Latuda 20 mg, 1 po with evening meal, at least 350 cal.  Wean off Lithium by taking 300 mg, 1 qd for 1 week, then stop. Continue Xanax 0.5 mg, 1/2-1 p.o. twice daily as needed. Continue Synthroid 75 mcg every morning. Continue Lexapro 20 mg, 1 p.o. every morning. Continue modafinil 200 mg, 1/4 -1/2 every morning as needed. Return in 6 weeks.  Melony Overly, PA-C

## 2021-05-05 ENCOUNTER — Telehealth: Payer: Self-pay | Admitting: Physician Assistant

## 2021-05-05 ENCOUNTER — Other Ambulatory Visit: Payer: Self-pay

## 2021-05-05 MED ORDER — LITHIUM CARBONATE ER 300 MG PO TBCR: 300.0000 mg | EXTENDED_RELEASE_TABLET | Freq: Two times a day (BID) | ORAL | 0 refills | Status: DC

## 2021-05-05 NOTE — Telephone Encounter (Signed)
Pt response.

## 2021-05-05 NOTE — Telephone Encounter (Signed)
Luis with [facility] called to get a refill on his Lithium Carbonate 300 mg ER. They have sent a fax but say they haven't gotten a response yet. Phone number is [phone] and reference # is [id number].

## 2021-05-05 NOTE — Telephone Encounter (Signed)
Rx sent 

## 2021-05-07 ENCOUNTER — Other Ambulatory Visit: Payer: Self-pay | Admitting: Physician Assistant

## 2021-05-08 ENCOUNTER — Other Ambulatory Visit: Payer: Self-pay | Admitting: Physician Assistant

## 2021-05-08 ENCOUNTER — Other Ambulatory Visit: Payer: Self-pay

## 2021-05-08 DIAGNOSIS — E785 Hyperlipidemia, unspecified: Secondary | ICD-10-CM

## 2021-05-08 MED ORDER — LURASIDONE HCL 20 MG PO TABS: 20.0000 mg | ORAL_TABLET | Freq: Every day | ORAL | 0 refills | Status: DC

## 2021-05-08 MED ORDER — FENOFIBRATE 160 MG PO TABS: 160.0000 mg | ORAL_TABLET | Freq: Every day | ORAL | 0 refills | Status: DC

## 2021-05-08 MED ORDER — ATORVASTATIN CALCIUM 10 MG PO TABS
10.0000 mg | ORAL_TABLET | Freq: Every day | ORAL | 0 refills | Status: DC
Start: 2021-05-08 — End: 2021-09-19

## 2021-05-09 NOTE — Telephone Encounter (Signed)
90 day ok?

## 2021-05-12 MED ORDER — PANTOPRAZOLE SODIUM 40 MG PO TBEC: DELAYED_RELEASE_TABLET | ORAL | 3 refills | Status: DC

## 2021-05-15 ENCOUNTER — Telehealth: Payer: Self-pay

## 2021-05-15 MED ORDER — PANTOPRAZOLE SODIUM 40 MG PO TBEC: DELAYED_RELEASE_TABLET | ORAL | 3 refills | Status: DC

## 2021-05-15 NOTE — Addendum Note (Signed)
Addended by: Christy Sartorius on: 05/15/2021 10:58 AM   Modules accepted: Orders

## 2021-05-24 NOTE — Telephone Encounter (Signed)
Prior Authorization submitted for LATUDA 20 MG with Rx Benefits, [email or web address]/, EOC [id number].   Pending response

## 2021-05-25 NOTE — Telephone Encounter (Signed)
Pt's approval received 05/25/2021 for LATUDA 20 MG effective 05/25/2021-05/24/2022 with RX Benefits. ID# [id number]

## 2021-05-28 NOTE — Telephone Encounter (Signed)
Sorry you are having such sx .   Ok to stay on the protonix  if is helpful and  wean only  if comfortable.  No aware if med change makes a difference

## 2021-05-29 ENCOUNTER — Telehealth: Payer: Self-pay | Admitting: Physician Assistant

## 2021-05-29 NOTE — Telephone Encounter (Signed)
We received her prior authorization for Latuda effective 10/27 so not sure why she can't fill it.

## 2021-05-29 NOTE — Telephone Encounter (Signed)
Pt stated the pharmacy won't fill will contact them in the morning

## 2021-05-29 NOTE — Telephone Encounter (Signed)
Lisa Lee, she can have 1 more sample pack.  But have her contact her pharmacy to fill the prescription now since it has been approved.  Thank you.

## 2021-05-29 NOTE — Telephone Encounter (Signed)
Pt LVM stating that [employer] advised her that there was one more box that you were supposed to check for them on her paperwork. She needs one more sample of the med until the [employer] is approved.  Do we have it available to pick up?  Next appt 11/10

## 2021-05-29 NOTE — Telephone Encounter (Signed)
Im not sure if this message was for traci or teresa but I believe it's regarding latuda.Ok to give samples?

## 2021-05-30 ENCOUNTER — Other Ambulatory Visit: Payer: Self-pay

## 2021-05-30 MED ORDER — LURASIDONE HCL 20 MG PO TABS: 20.0000 mg | ORAL_TABLET | Freq: Every day | ORAL | 0 refills | Status: DC

## 2021-05-30 NOTE — Telephone Encounter (Signed)
Thanks for follow up.

## 2021-05-30 NOTE — Telephone Encounter (Signed)
Spoke to pharmacy and she just needed a new rx sent.It has been sent

## 2021-06-08 ENCOUNTER — Other Ambulatory Visit: Payer: Self-pay

## 2021-06-08 ENCOUNTER — Ambulatory Visit: Payer: 59 | Admitting: Physician Assistant

## 2021-06-08 ENCOUNTER — Encounter: Payer: Self-pay | Admitting: Physician Assistant

## 2021-06-08 DIAGNOSIS — F411 Generalized anxiety disorder: Secondary | ICD-10-CM | POA: Diagnosis not present

## 2021-06-08 DIAGNOSIS — F319 Bipolar disorder, unspecified: Secondary | ICD-10-CM | POA: Diagnosis not present

## 2021-06-08 MED ORDER — ESCITALOPRAM OXALATE 20 MG PO TABS: 20.0000 mg | ORAL_TABLET | Freq: Every day | ORAL | 3 refills | Status: DC

## 2021-06-08 MED ORDER — MODAFINIL 200 MG PO TABS: 100.0000 mg | ORAL_TABLET | Freq: Every day | ORAL | 5 refills | Status: DC

## 2021-06-08 NOTE — Progress Notes (Signed)
[facility]  Patient ID: Lisa Lee,  MRN: [id number]  PCP: Lisa Headings, MD  Date of Evaluation: 06/08/2021 Time spent:20 minutes  Chief Complaint:  Chief Complaint   Depression; Anxiety; Insomnia; Follow-up      HISTORY/CURRENT STATUS: For routine med check.  We started Lisa Lee about 6 weeks ago and she is doing great!  States a "cloud has been lifted" and she feels calmer, happier, is sleeping better and does not need the Unisom like she did.  She does not get angry easily and fly off the handle like she had been.  "It has been great."  And being off the lithium has been a good change as well.  She is not having a tremor in her hands at all anymore.  She does still have a tic of moving her mouth funny at times but is not as bad as it was.  She does not want to take any more medication to treat this.  She has gained about 2 pounds since starting the Latuda, has been a little hungrier but has joined [facility] and is aware not to eat too much and needs to exercise more anyway.  Has not been needing the Xanax at all.  She is more able to enjoy things now than she did.  She does still get drowsy if she does not take the modafinil but only takes it on the days that she works, and only 1/4 pill (50 mg) at that.  Allows herself to nap on days she is off, if she wants or needs to.   No extreme sadness, tearfulness, or feelings of hopelessness.  Denies any changes in concentration, making decisions or remembering things.  Denies suicidal or homicidal thoughts.  Patient denies increased energy with decreased need for sleep, no increased talkativeness, no racing thoughts, no impulsivity or risky behaviors, no increased spending, no increased libido, no grandiosity, no increased irritability or anger, no paranoia, and no hallucinations.  Individual Medical History/ Review of Systems: Changes? :No     Past medications for mental health diagnoses  include: Lexapro, lithium, Xanax, Modafanil  Allergies: Penicillins, Pravastatin, and Rosuvastatin  Current Medications:  Current Outpatient Medications:    albuterol (PROAIR HFA) 108 (90 Base) MCG/ACT inhaler, Inhale 2 puffs into the lungs every 6 (six) hours as needed for wheezing., Disp: 1 each, Rfl: 1   aspirin EC 81 MG tablet, Take 1 tablet (81 mg total) by mouth daily., Disp: , Rfl:    atorvastatin (LIPITOR) 10 MG tablet, Take 1 tablet (10 mg total) by mouth daily., Disp: 90 tablet, Rfl: 0   Cholecalciferol (VITAMIN D) 2000 units tablet, Take 4,000 Units by mouth in the morning and at bedtime., Disp: , Rfl:    doxylamine, Sleep, (UNISOM) 25 MG tablet, Take 25 mg by mouth at bedtime as needed., Disp: , Rfl:    famotidine (PEPCID) 20 MG tablet, TAKE 1 TABLET AT BEDTIME, Disp: 90 tablet, Rfl: 3   fenofibrate 160 MG tablet, Take 1 tablet (160 mg total) by mouth daily. Please keep upcoming appt with Lisa Lee in December 2022 before anymore refills. Thank you, Disp: 90 tablet, Rfl: 0   levothyroxine (SYNTHROID) 75 MCG tablet, Take 1 tablet (75 mcg total) by mouth daily before breakfast., Disp: 90 tablet, Rfl: 3   loperamide (IMODIUM A-D) 2 MG tablet, Take 2 mg by mouth daily., Disp: , Rfl:    lurasidone (LATUDA) 20 MG TABS tablet, Take 1 tablet (20 mg total) by mouth  daily with supper., Disp: 90 tablet, Rfl: 0   Melatonin 3 MG CAPS, Take by mouth., Disp: , Rfl:    Multiple Vitamins-Minerals (MULTIVITAMIN WITH MINERALS) tablet, Take 1 tablet by mouth daily., Disp: , Rfl:    valACYclovir (VALTREX) 500 MG tablet, TAKE 1 TABLET DAILY FOR SUPPRESSION AND 1 TABLET TWICE A DAY FOR 3 DAYS FOR OUTBREAK, Disp: 30 tablet, Rfl: 13   ALPRAZolam (XANAX) 0.5 MG tablet, Take 1/2 to 1 tablet (0.25-0.5 mg total) by mouth 2 times daily as needed for anxiety. (Patient not taking: Reported on 06/08/2021), Disp: 30 tablet, Rfl: 1   escitalopram (LEXAPRO) 20 MG tablet, Take 1 tablet (20 mg total) by mouth daily.,  Disp: 90 tablet, Rfl: 3   Ginkgo Biloba 40 MG TABS, Take 1 tablet by mouth in the morning and at bedtime. (Patient not taking: Reported on 06/08/2021), Disp: , Rfl:    HYDROcodone bit-homatropine (HYCODAN) 5-1.5 MG/5ML syrup, Take 5 mLs by mouth every 8 (eight) hours as needed for cough. (Patient not taking: Reported on 06/08/2021), Disp: 120 mL, Rfl: 0   lithium carbonate (LITHOBID) 300 MG CR tablet, Take 1 tablet (300 mg total) by mouth 2 (two) times daily., Disp: 180 tablet, Rfl: 0   modafinil (PROVIGIL) 200 MG tablet, Take 0.5 tablets (100 mg total) by mouth daily., Disp: 30 tablet, Rfl: 5   pantoprazole (PROTONIX) 40 MG tablet, TAKE 1 TABLET DAILY (HAS AN APPOINTMENT SCHEDULED IN AUGUST) (Patient not taking: Reported on 06/08/2021), Disp: 90 tablet, Rfl: 3 Medication Side Effects: none  Family Medical/ Social History: Changes? No  MENTAL HEALTH EXAM:  Last menstrual period 12/09/2013.There is no height or weight on file to calculate BMI.  General Appearance: Casual and Well Groomed  Eye Contact:  Good  Speech:  Clear and Coherent and Normal Rate  Volume:  Normal  Mood:  Euthymic  Affect:  Congruent  Thought Process:  Goal Directed and Descriptions of Associations: Circumstantial  Orientation:  Full (Time, Place, and Person)  Thought Content: Logical   Suicidal Thoughts:  No  Homicidal Thoughts:  No  Memory:  WNL  Judgement:  Good  Insight:  Good  Psychomotor Activity:  Normal  Concentration:  Concentration: Good and Attention Span: Good  Recall:  Good  Fund of Knowledge: Good  Language: Good  Assets:  Desire for Improvement  ADL's:  Intact  Cognition: WNL  Prognosis:  Good   Labs 04/11/2021 Lithium level 0.8 CMP sodium 136 creatinine 1.05 BUN 31, glucose 79, LFTs were completely normal Hemoglobin A1c 6.0 CBC with differential, white blood count 11.7, hemoglobin 11.9, platelets 475 Lipid panel completely normal TSH 3.23  DIAGNOSES:    ICD-10-CM   1. Bipolar I  disorder (HCC)  F31.9     2. Generalized anxiety disorder  F41.1         Receiving Psychotherapy: No    RECOMMENDATIONS:  PDMP was reviewed.  Last modafinil 12/22/2020.  Hycodan cough syrup since then, known to me. I provided 20 minutes of face to face time during this encounter, including time spent before and after the visit in records review, medical decision making, and charting.  I am glad to see her doing so well!  No changes in medications need to be made. Continue Latuda 20 mg, 1 po with evening meal, at least 350 cal.  Continue Xanax 0.5 mg, 1/2-1 p.o. twice daily as needed. Continue Synthroid 75 mcg every morning. Continue Lexapro 20 mg, 1 p.o. every morning. Continue modafinil 200 mg, 1/4 -  1/2 every morning as needed. Return in 3 months.  Melony Overly, PA-C

## 2021-06-26 NOTE — Progress Notes (Signed)
Cardiology Office Note:    Date:  07/10/2021   ID:  [name], DOB Nov 17, 1960, MRN [id number]  PCP:  Madelin Headings, MD   [facility] Providers Cardiologist:  None     Referring MD: Madelin Headings, MD    History of Present Illness:    Lisa Lee is a 60 y.o. female with a hx of HLD, depression, asthma, and significant family history of premature CAD with Ca score 0 who was previously followed by Dr. Delton See who now presents to clinic for follow-up.   Last saw Dr. Delton See on 05/26/20 where she was doing well with no anginal symptoms.   Today, the patient states she has been having occasional substernal chest discomfort with hard exercise. Specifically, it feels like a dull ache in her chest that resolves with time and decreasing in the intensity of her exercise. Has occurred twice over the past 3 months and lasts a couple of minutes before resolving. No lightheadedness, dizziness, syncope, SOB. 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   Family history: Sister with MI in 43s s/p PCI and CABG, brother with CABG in 52s.   Past Medical History:  Diagnosis Date   Acid reflux    Allergic rhinitis    Asthma    Depression    Diarrhea 08/09/2011   Poss ibs  Vs other  Related to stress  consdier other eval if needed. Had colonoscoopy per dr Matthias Hughs    Hemorrhoids    High cholesterol    HPV in female 1984   Hypothyroidism    Medication side effect 11/01/2011   tussionex   distrubed sleep    Thyroid disease     Past Surgical History:  Procedure Laterality Date   CERVICAL CONIZATION W/BX N/A 07/21/2015   Procedure: CONIZATION CERVIX WITH BIOPSY;  Surgeon: Kirkland Hun, MD;  Location: [facility];  Service: Gynecology;  Laterality: N/A;   CERVICAL CONIZATION W/BX N/A 09/25/2018   Procedure: CONIZATION CERVIX WITH BIOPSY;  Surgeon: Adolphus Birchwood, MD;  Location: [facility];  Service: Gynecology;   Laterality: N/A;   CERVICAL CRYOTHERAPY  1984   COLPOSCOPY  08/26/13   DILATION AND CURETTAGE, DIAGNOSTIC / THERAPEUTIC  1994   Blighted Ovum   HYSTEROSCOPY     uterine polypectomy Jan 7th   HYSTEROSCOPY WITH RESECTOSCOPE  08/04/2009   Removed polyp & IUD   LYMPH NODE BIOPSY     TONSILLECTOMY      Current Medications: Current Meds  Medication Sig   albuterol (PROAIR HFA) 108 (90 Base) MCG/ACT inhaler Inhale 2 puffs into the lungs every 6 (six) hours as needed for wheezing.   ALPRAZolam (XANAX) 0.5 MG tablet Take 1/2 to 1 tablet (0.25-0.5 mg total) by mouth 2 times daily as needed for anxiety.   aspirin EC 81 MG tablet Take 1 tablet (81 mg total) by mouth daily.   atorvastatin (LIPITOR) 10 MG tablet Take 1 tablet (10 mg total) by mouth daily.   Cholecalciferol (VITAMIN D) 2000 units tablet Take 4,000 Units by mouth in the morning and at bedtime.   doxylamine, Sleep, (UNISOM) 25 MG tablet Take 25 mg by mouth at bedtime as needed.   escitalopram (LEXAPRO) 20 MG tablet Take 1 tablet (20 mg total) by mouth daily.   ezetimibe (ZETIA) 10 MG tablet Take 1 tablet (10 mg total) by mouth daily.   famotidine (PEPCID) 20 MG tablet TAKE 1 TABLET AT  BEDTIME   fenofibrate 160 MG tablet Take 1 tablet (160 mg total) by mouth daily. Please keep upcoming appt with Dr Shari Prows in December 2022 before anymore refills. Thank you   Ginkgo Biloba 40 MG TABS Take 1 tablet by mouth in the morning and at bedtime.   HYDROcodone bit-homatropine (HYCODAN) 5-1.5 MG/5ML syrup Take 5 mLs by mouth every 8 (eight) hours as needed for cough.   levothyroxine (SYNTHROID) 75 MCG tablet Take 1 tablet (75 mcg total) by mouth daily before breakfast.   loperamide (IMODIUM A-D) 2 MG tablet Take 2 mg by mouth daily.   lurasidone (LATUDA) 20 MG TABS tablet Take 1 tablet (20 mg total) by mouth daily with supper.   Melatonin 3 MG CAPS Take by mouth.   metoprolol tartrate (LOPRESSOR) 100 MG tablet Take 1 tablet (100 mg total) by mouth  once for 1 dose. Take 90-120 minutes prior to scan.   modafinil (PROVIGIL) 200 MG tablet Take 0.5 tablets (100 mg total) by mouth daily.   Multiple Vitamins-Minerals (MULTIVITAMIN WITH MINERALS) tablet Take 1 tablet by mouth daily.   pantoprazole (PROTONIX) 40 MG tablet TAKE 1 TABLET DAILY (HAS AN APPOINTMENT SCHEDULED IN AUGUST)   valACYclovir (VALTREX) 500 MG tablet TAKE 1 TABLET DAILY FOR SUPPRESSION AND 1 TABLET TWICE A DAY FOR 3 DAYS FOR OUTBREAK     Allergies:   Penicillins, Pravastatin, and Rosuvastatin   Social History   Socioeconomic History   Marital status: Married    Spouse name: Not on file   Number of children: Not on file   Years of education: Not on file   Highest education level: Not on file  Occupational History   Not on file  Tobacco Use   Smoking status: Never   Smokeless tobacco: Never  Vaping Use   Vaping Use: Never used  Substance and Sexual Activity   Alcohol use: Yes    Alcohol/week: 0.0 standard drinks    Comment: monthly ocassional drink   Drug use: No   Sexual activity: Yes    Birth control/protection: None  Other Topics Concern   Not on file  Social History Narrative    [place] [employer] 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    Divorced   Regular exercise-  Not as much recently    [facility]   Pets 2 cats 1 dog to move   Daughter  On recovery heroin   Social Determinants of Health   Financial Resource Strain: Not on file  Food Insecurity: Not on file  Transportation Needs: Not on file  Physical Activity: Not on file  Stress: Not on file  Social Connections: Not on file     Family History: The patient's family history includes Arthritis in her mother; Colon cancer in her father; Heart disease in her brother, father, mother, and sister; Heart disease (age of onset: 102) in her sister; Parkinson's disease in her father; Pulmonary embolism in her daughter; Thyroid disease in her daughter and sister. There is no  history of Rectal cancer.  ROS:   Please see the history of present illness.    Review of Systems  Constitutional:  Negative for chills and fever.  Respiratory:  Negative for shortness of breath.   Cardiovascular:  Positive for chest pain. Negative for palpitations, orthopnea, claudication, leg swelling and PND.  Gastrointestinal:  Negative for nausea and vomiting.  Musculoskeletal:  Negative for falls.  Neurological:  Negative for dizziness and loss of consciousness.  EKGs/Labs/Other Studies Reviewed:    The following studies were reviewed today: CTA 12-11-15: FINDINGS: Cardiovascular: Normal heart size. No significant pericardial fluid/thickening. Left main and left anterior descending coronary atherosclerosis. Atherosclerotic nonaneurysmal thoracic aorta. Normal caliber pulmonary arteries.   Mediastinum/Nodes: No discrete thyroid nodules. Unremarkable esophagus. No pathologically enlarged axillary, mediastinal or gross hilar lymph nodes, noting limited sensitivity for the detection of hilar adenopathy on this noncontrast study.   Lungs/Pleura: No pneumothorax. No pleural effusion. Medial right upper lobe 2 mm solid pulmonary nodule (series 5/ image 62) is stable since 05/11/2014 and considered benign. Subpleural lateral segment right middle lobe 4 mm pulmonary nodule (series 5/ image 83) is stable since 05/11/2014 and considered benign. No acute consolidative airspace disease, lung masses or new significant pulmonary nodules. No significant regions of subpleural reticulation, ground-glass attenuation, traction bronchiectasis, parenchymal banding, architectural distortion or frank honeycombing. Mild patchy air trapping in both lungs on the expiration sequence.   Upper abdomen: Unremarkable.   Musculoskeletal: No aggressive appearing focal osseous lesions. Moderate thoracic spondylosis.   IMPRESSION: 1. No evidence of interstitial lung disease. No acute  pulmonary disease. 2. Mild patchy air trapping in both lungs, compatible with small airways disease. 3. Aortic atherosclerosis. Left main and 1 vessel coronary atherosclerosis.  EKG:  EKG is  ordered today.  The ekg ordered today demonstrates NSR with HR 75  Recent Labs: 04/11/2021: ALT 24; BUN 31; Creatinine, Ser 1.05; Hemoglobin 11.9; Platelets 475.0; Potassium 4.2; Sodium 136; TSH 3.23  Recent Lipid Panel    Component Value Date/Time   CHOL 159 04/11/2021 1114   CHOL 167 06/03/2019 0840   TRIG 94.0 04/11/2021 1114   HDL 52.60 04/11/2021 1114   HDL 53 06/03/2019 0840   CHOLHDL 3 04/11/2021 1114   VLDL 18.8 04/11/2021 1114   LDLCALC 88 04/11/2021 1114   LDLCALC 95 03/29/2020 1443   LDLDIRECT 101.0 10/30/2016 1051          Physical Exam:    VS:  BP 120/74   Pulse 75   Ht 5' 0.5" (1.537 m)   Wt 134 lb 12.8 oz (61.1 kg)   LMP 12/09/2013   SpO2 97%   BMI 25.89 kg/m     Wt Readings from Last 3 Encounters:  07/10/21 134 lb 12.8 oz (61.1 kg)  04/11/21 131 lb 6.4 oz (59.6 kg)  05/26/20 137 lb (62.1 kg)     GEN:  Well nourished, well developed in no acute distress HEENT: Normal NECK: No JVD; No carotid bruits CARDIAC: RRR, no murmurs, rubs, gallops RESPIRATORY:  Clear to auscultation without rales, wheezing or rhonchi  ABDOMEN: Soft, non-tender, non-distended MUSCULOSKELETAL:  No edema; No deformity  SKIN: Warm and dry NEUROLOGIC:  Alert and oriented x 3 PSYCHIATRIC:  Normal affect   ASSESSMENT:    1. Precordial pain   2. Encounter for laboratory examination   3. Hyperlipidemia, unspecified hyperlipidemia type   4. Family history of early CAD    PLAN:    In order of problems listed above:  #Chest Pain: #CAD with Ca in LAD and LM: Patient states she has been having intermittent chest pain with hard exertion that resolves with rest. Last Ca score in 2011/12/11 0, however, CTA in 12-11-2015 with LM and LAD atherosclerosis. Has very strong family history of CAD and  premature CAD. Given symptoms and risk factors, will check coronary CTA. -Check coronary CTA -Continue lipitor 10mg  daily; intolerant of higher doses/alternative statins -Continue fenofibrate 160mg  daily -Start zetia 10mg  daily -Continue ASA 81mg   daily  #HLD: Ca score 0 in 2013, however, CT chest in 2017 with LAD/LM atherosclerosis. LDL goal for now <70 pending CTA. Intolerant of multiple statins and cannot up-titrate lipitor due to muscle cramps. -Continue lipitor 10mg  daily; intolerant of higher doses/alternative statins -Continue fenofibrate 160mg  daily -Start zetia 10mg  daily -Goal LDL<70 pending coronary CTA -Repeat lipids in 6-8 weeks    Medication Adjustments/Labs and Tests Ordered: Current medicines are reviewed at length with the patient today.  Concerns regarding medicines are outlined above.  Orders Placed This Encounter  Procedures   CT CORONARY MORPH W/CTA COR W/SCORE W/CA W/CM &/OR WO/CM   Basic metabolic panel   EKG 12-Lead   Meds ordered this encounter  Medications   metoprolol tartrate (LOPRESSOR) 100 MG tablet    Sig: Take 1 tablet (100 mg total) by mouth once for 1 dose. Take 90-120 minutes prior to scan.    Dispense:  1 tablet    Refill:  0   ezetimibe (ZETIA) 10 MG tablet    Sig: Take 1 tablet (10 mg total) by mouth daily.    Dispense:  90 tablet    Refill:  3    Patient Instructions  Medication Instructions:   START TAKING ZETIA 10 MG BY MOUTH DAILY  *If you need a refill on your cardiac medications before your next appointment, please call your pharmacy*   Lab Work:  TODAY--BMET--NEEDED FOR UPCOMING CARDIAC CT  If you have labs (blood work) drawn today and your tests are completely normal, you will receive your results only by: MyChart Message (if you have MyChart) OR A paper copy in the mail If you have any lab test that is abnormal or we need to change your treatment, we will call you to review the  results.   Testing/Procedures:    Your cardiac CT will be scheduled at one of the below locations:   [facility] [address] [place], [place] [address] [phone]  If scheduled at [facility], please arrive at the [facility] main entrance (entrance A) of [facility] 30 minutes prior to test start time. You can use the FREE valet parking offered at the main entrance (encouraged to control the heart rate for the test) Proceed to the [facility] Radiology Department (first floor) to check-in and test prep.  Please follow these instructions carefully (unless otherwise directed):  On the Night Before the Test: Be sure to Drink plenty of water. Do not consume any caffeinated/decaffeinated beverages or chocolate 12 hours prior to your test. Do not take any antihistamines 12 hours prior to your test.  On the Day of the Test: Drink plenty of water until 1 hour prior to the test. Do not eat any food 4 hours prior to the test. You may take your regular medications prior to the test.  Take metoprolol 100 MG BY MOUTH (Lopressor) two hours prior to test. HOLD Furosemide/Hydrochlorothiazide morning of the test. FEMALES- please wear underwire-free bra if available, avoid dresses & tight clothing    After the Test: Drink plenty of water. After receiving IV contrast, you may experience a mild flushed feeling. This is normal. On occasion, you may experience a mild rash up to 24 hours after the test. This is not dangerous. If this occurs, you can take Benadryl 25 mg and increase your fluid intake. If you experience trouble breathing, this can be serious. If it is severe call 911 IMMEDIATELY. If it is mild, please call our office.  Please allow 2-4 weeks for scheduling of routine cardiac CTs. Some insurance companies require a pre-authorization which may delay scheduling of this test.   For non-scheduling related questions, please contact the cardiac imaging  nurse navigator should you have any questions/concerns: Rockwell Alexandria, Cardiac Imaging Nurse Navigator Larey Brick, Cardiac Imaging Nurse Navigator [facility] Direct Office Dial: [phone]   For scheduling needs, including cancellations and rescheduling, please call [place], [phone].    Follow-Up: At [facility], you and your health needs are our priority.  As part of our continuing mission to provide you with exceptional heart care, we have created designated Provider Care Teams.  These Care Teams include your primary Cardiologist (physician) and Advanced Practice Providers (APPs -  Physician Assistants and Nurse Practitioners) who all work together to provide you with the care you need, when you need it.  We recommend signing up for the patient portal called "MyChart".  Sign up information is provided on this After Visit Summary.  MyChart is used to connect with patients for Virtual Visits (Telemedicine).  Patients are able to view lab/test results, encounter notes, upcoming appointments, etc.  Non-urgent messages can be sent to your provider as well.   To learn more about what you can do with MyChart, go to [email or web address].    Your next appointment:   1 year(s)  The format for your next appointment:   In Person  Provider:   DR. Shari Prows   Signed, Meriam Sprague, MD  07/10/2021 10:11 AM    [facility]

## 2021-06-30 ENCOUNTER — Encounter: Payer: Self-pay | Admitting: Family Medicine

## 2021-06-30 MED ORDER — HYDROCODONE BIT-HOMATROP MBR 5-1.5 MG/5ML PO SOLN: 5.0000 mL | Freq: Three times a day (TID) | ORAL | 0 refills | Status: DC | PRN

## 2021-06-30 NOTE — Telephone Encounter (Signed)
I sent in 1 refill of the cough medicine

## 2021-07-10 ENCOUNTER — Encounter: Payer: Self-pay | Admitting: Cardiology

## 2021-07-10 ENCOUNTER — Ambulatory Visit (INDEPENDENT_AMBULATORY_CARE_PROVIDER_SITE_OTHER): Payer: 59 | Admitting: Cardiology

## 2021-07-10 ENCOUNTER — Other Ambulatory Visit: Payer: Self-pay

## 2021-07-10 ENCOUNTER — Telehealth: Payer: Self-pay | Admitting: *Deleted

## 2021-07-10 VITALS — BP 120/74 | HR 75 | Ht 60.5 in | Wt 134.8 lb

## 2021-07-10 DIAGNOSIS — Z0189 Encounter for other specified special examinations: Secondary | ICD-10-CM

## 2021-07-10 DIAGNOSIS — R072 Precordial pain: Secondary | ICD-10-CM | POA: Diagnosis not present

## 2021-07-10 DIAGNOSIS — E785 Hyperlipidemia, unspecified: Secondary | ICD-10-CM | POA: Diagnosis not present

## 2021-07-10 DIAGNOSIS — Z8249 Family history of ischemic heart disease and other diseases of the circulatory system: Secondary | ICD-10-CM | POA: Diagnosis not present

## 2021-07-10 LAB — BASIC METABOLIC PANEL
BUN/Creatinine Ratio: 26 (ref 12–28)
BUN: 25 mg/dL (ref 8–27)
CO2: 21 mmol/L (ref 20–29)
Calcium: 10.8 mg/dL — ABNORMAL HIGH (ref 8.7–10.3)
Chloride: 104 mmol/L (ref 96–106)
Creatinine, Ser: 0.96 mg/dL (ref 0.57–1.00)
Glucose: 104 mg/dL — ABNORMAL HIGH (ref 70–99)
Potassium: 4.8 mmol/L (ref 3.5–5.2)
Sodium: 139 mmol/L (ref 134–144)
eGFR: 67 mL/min/{1.73_m2} (ref >59)

## 2021-07-10 MED ORDER — METOPROLOL TARTRATE 100 MG PO TABS: 100.0000 mg | ORAL_TABLET | Freq: Once | ORAL | 0 refills | Status: DC

## 2021-07-10 MED ORDER — EZETIMIBE 10 MG PO TABS: 10.0000 mg | ORAL_TABLET | Freq: Every day | ORAL | 3 refills | Status: DC

## 2021-07-10 NOTE — Patient Instructions (Addendum)
Medication Instructions:   START TAKING ZETIA 10 MG BY MOUTH DAILY  *If you need a refill on your cardiac medications before your next appointment, please call your pharmacy*   Lab Work:  TODAY--BMET--NEEDED FOR UPCOMING CARDIAC CT  If you have labs (blood work) drawn today and your tests are completely normal, you will receive your results only by: MyChart Message (if you have MyChart) OR A paper copy in the mail If you have any lab test that is abnormal or we need to change your treatment, we will call you to review the results.   Testing/Procedures:    Your cardiac CT will be scheduled at one of the below locations:   [facility] [address] [place], [place] [address] [phone]  If scheduled at [facility], please arrive at the [facility] main entrance (entrance A) of [facility] 30 minutes prior to test start time. You can use the FREE valet parking offered at the main entrance (encouraged to control the heart rate for the test) Proceed to the [facility] Radiology Department (first floor) to check-in and test prep.  Please follow these instructions carefully (unless otherwise directed):  On the Night Before the Test: Be sure to Drink plenty of water. Do not consume any caffeinated/decaffeinated beverages or chocolate 12 hours prior to your test. Do not take any antihistamines 12 hours prior to your test.  On the Day of the Test: Drink plenty of water until 1 hour prior to the test. Do not eat any food 4 hours prior to the test. You may take your regular medications prior to the test.  Take metoprolol 100 MG BY MOUTH (Lopressor) two hours prior to test. HOLD Furosemide/Hydrochlorothiazide morning of the test. FEMALES- please wear underwire-free bra if available, avoid dresses & tight clothing    After the Test: Drink plenty of water. After receiving IV contrast, you may experience a mild flushed feeling. This is normal. On  occasion, you may experience a mild rash up to 24 hours after the test. This is not dangerous. If this occurs, you can take Benadryl 25 mg and increase your fluid intake. If you experience trouble breathing, this can be serious. If it is severe call 911 IMMEDIATELY. If it is mild, please call our office.  Please allow 2-4 weeks for scheduling of routine cardiac CTs. Some insurance companies require a pre-authorization which may delay scheduling of this test.   For non-scheduling related questions, please contact the cardiac imaging nurse navigator should you have any questions/concerns: Rockwell Alexandria, Cardiac Imaging Nurse Navigator Larey Brick, Cardiac Imaging Nurse Navigator [place] [facility] Direct Office Dial: [phone]   For scheduling needs, including cancellations and rescheduling, please call [place], [phone].    Follow-Up: At [facility], you and your health needs are our priority.  As part of our continuing mission to provide you with exceptional heart care, we have created designated Provider Care Teams.  These Care Teams include your primary Cardiologist (physician) and Advanced Practice Providers (APPs -  Physician Assistants and Nurse Practitioners) who all work together to provide you with the care you need, when you need it.  We recommend signing up for the patient portal called "MyChart".  Sign up information is provided on this After Visit Summary.  MyChart is used to connect with patients for Virtual Visits (Telemedicine).  Patients are able to view lab/test results, encounter notes, upcoming appointments, etc.  Non-urgent messages can be sent to your provider  as well.   To learn more about what you can do with MyChart, go to [email or web address].    Your next appointment:   1 year(s)  The format for your next appointment:   In Person  Provider:   DR. Shari Prows

## 2021-07-10 NOTE — Telephone Encounter (Signed)
-----   Message from Lorrin Jackson sent at 07/10/2021 11:32 AM EST ----- Regarding: ct heart Scheduled 07/20/21 at 8:30   Thanks, [place]

## 2021-07-19 ENCOUNTER — Telehealth (HOSPITAL_COMMUNITY): Payer: Self-pay | Admitting: Emergency Medicine

## 2021-07-19 NOTE — Telephone Encounter (Signed)
Reaching out to patient to offer assistance regarding upcoming cardiac imaging study; pt verbalizes understanding of appt date/time, parking situation and where to check in, pre-test NPO status and medications ordered, and verified current allergies; name and call back number provided for further questions should they arise Rockwell Alexandria RN Navigator Cardiac Imaging [facility] [phone] office [phone] cell  Arrival 8:00a 100mg  metoprolol tart  PRN xanax for anxiety

## 2021-07-20 ENCOUNTER — Other Ambulatory Visit: Payer: Self-pay

## 2021-07-20 ENCOUNTER — Ambulatory Visit (HOSPITAL_COMMUNITY)
Admission: RE | Admit: 2021-07-20 | Discharge: 2021-07-20 | Disposition: A | Discharge status: Home / Self Care | Payer: 59 | Source: Ambulatory Visit | Attending: Cardiology | Admitting: Cardiology

## 2021-07-20 DIAGNOSIS — R072 Precordial pain: Secondary | ICD-10-CM

## 2021-07-20 MED ORDER — IOHEXOL 350 MG/ML SOLN
100.0000 mL | Freq: Once | INTRAVENOUS | Status: AC | PRN
  Administered 2021-07-20: 09:00:00 95 mL via INTRAVENOUS

## 2021-07-20 MED ORDER — NITROGLYCERIN 0.4 MG SL SUBL
0.8000 mg | SUBLINGUAL_TABLET | Freq: Once | SUBLINGUAL | Status: AC
  Administered 2021-07-20: 09:00:00 0.8 mg via SUBLINGUAL

## 2021-07-20 MED ORDER — NITROGLYCERIN 0.4 MG SL SUBL
SUBLINGUAL_TABLET | SUBLINGUAL | Status: AC
  Filled 2021-07-20: qty 2

## 2021-07-21 ENCOUNTER — Telehealth: Payer: Self-pay | Admitting: Cardiology

## 2021-07-21 NOTE — Telephone Encounter (Signed)
° ° °  Pt is returning call to get Ct result °

## 2021-07-21 NOTE — Telephone Encounter (Signed)
Chandrasekhar, Mahesh A, MD  P Cv Div Ch St Triage No evidence that CP is cardiac in nature.  No change in meds.   The patient has been notified of the result and verbalized understanding.  All questions (if any) were answered. Theresia Majors, RN 07/21/2021 1:48 PM

## 2021-07-22 ENCOUNTER — Other Ambulatory Visit: Payer: Self-pay | Admitting: Physician Assistant

## 2021-07-26 ENCOUNTER — Encounter: Payer: Self-pay | Admitting: Internal Medicine

## 2021-07-26 MED ORDER — LEVOTHYROXINE SODIUM 75 MCG PO TABS
75.0000 ug | ORAL_TABLET | Freq: Every day | ORAL | 3 refills | Status: DC
Start: 2021-07-26 — End: 2022-04-27

## 2021-07-27 NOTE — Telephone Encounter (Signed)
Last filled 5/26

## 2021-08-27 ENCOUNTER — Other Ambulatory Visit: Payer: Self-pay | Admitting: Physician Assistant

## 2021-08-28 NOTE — Telephone Encounter (Signed)
Pt LVM stating she would like refill of Latuda before her insurance changes on Feb 1.  Next appt 4/10

## 2021-09-09 ENCOUNTER — Encounter: Payer: Self-pay | Admitting: Internal Medicine

## 2021-09-10 ENCOUNTER — Encounter: Payer: Self-pay | Admitting: Internal Medicine

## 2021-09-11 MED ORDER — VALACYCLOVIR HCL 500 MG PO TABS: ORAL_TABLET | ORAL | 0 refills | Status: DC

## 2021-09-11 MED ORDER — ACYCLOVIR 5 % EX OINT: 1.0000 "application " | TOPICAL_OINTMENT | CUTANEOUS | 2 refills | Status: DC

## 2021-09-11 NOTE — Telephone Encounter (Signed)
Usually the topical is not recommended as more helpful However we will send in topical uncertain if it is covered by insurance Valtrex has already been sent into the pharmacy.

## 2021-09-14 ENCOUNTER — Ambulatory Visit: Payer: 59 | Admitting: Physician Assistant

## 2021-09-19 ENCOUNTER — Other Ambulatory Visit: Payer: Self-pay

## 2021-09-19 MED ORDER — ATORVASTATIN CALCIUM 10 MG PO TABS: 10.0000 mg | ORAL_TABLET | Freq: Every day | ORAL | 3 refills | Status: DC

## 2021-09-22 ENCOUNTER — Encounter: Payer: Self-pay | Admitting: Internal Medicine

## 2021-09-25 NOTE — Telephone Encounter (Signed)
Hope you are feeling better.  Please update [place] on how you are doing.  Cough can last a few weeks.  Many people feel extra tired until they get better.  But then do fine.Marland Kitchen

## 2021-10-01 ENCOUNTER — Encounter: Payer: Self-pay | Admitting: Cardiology

## 2021-10-03 ENCOUNTER — Other Ambulatory Visit: Payer: Self-pay

## 2021-10-03 DIAGNOSIS — E785 Hyperlipidemia, unspecified: Secondary | ICD-10-CM

## 2021-10-03 MED ORDER — FENOFIBRATE 160 MG PO TABS: 160.0000 mg | ORAL_TABLET | Freq: Every day | ORAL | 2 refills | Status: DC

## 2021-10-13 ENCOUNTER — Other Ambulatory Visit: Payer: Self-pay | Admitting: Internal Medicine

## 2021-10-20 ENCOUNTER — Telehealth: Payer: Self-pay | Admitting: Cardiology

## 2021-10-20 NOTE — Telephone Encounter (Signed)
Reference#  [id number] ? ?STAT* If patient is at the pharmacy, call can be transferred to refill team. ? ? ?1. Which medications need to be refilled? (please list name of each medication and dose if known) Fenofibrate ? ?2. Which pharmacy/location (including street and city if local pharmacy) is medication to be sent to? [facility] ? ?3. Do they need a 30 day or 90 day supply? 90 days and refills ? ?

## 2021-10-20 NOTE — Telephone Encounter (Signed)
Pt's medication has already been sent to pt's pharmacy as requested. Confirmation received.  

## 2021-11-06 ENCOUNTER — Ambulatory Visit: Payer: BC Managed Care – PPO | Admitting: Physician Assistant

## 2021-11-06 ENCOUNTER — Encounter: Payer: Self-pay | Admitting: Physician Assistant

## 2021-11-06 DIAGNOSIS — F411 Generalized anxiety disorder: Secondary | ICD-10-CM

## 2021-11-06 DIAGNOSIS — G4700 Insomnia, unspecified: Secondary | ICD-10-CM | POA: Diagnosis not present

## 2021-11-06 DIAGNOSIS — F319 Bipolar disorder, unspecified: Secondary | ICD-10-CM | POA: Diagnosis not present

## 2021-11-06 MED ORDER — LURASIDONE HCL 20 MG PO TABS: ORAL_TABLET | ORAL | 3 refills | Status: DC

## 2021-11-06 MED ORDER — ALPRAZOLAM 0.5 MG PO TABS: ORAL_TABLET | ORAL | 1 refills | Status: DC

## 2021-11-06 NOTE — Progress Notes (Signed)
[facility] ? ?Patient ID: Lisa Lee,  ?MRN: [id number] ? ?PCP: Madelin Headings, MD ? ?Date of Evaluation: 11/06/2021 ?Time spent:30 minutes ? ?Chief Complaint:  ?Chief Complaint   ?Anxiety; Depression; Follow-up ?  ? ? ?HISTORY/CURRENT STATUS: ?For routine med check. ? ?Has started a new career! Now teaching, see Social history. 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 Likes the change. ? ?Patient denies loss of interest in usual activities and is able to enjoy things.  Denies decreased energy or motivation.  Appetite has not changed.  No extreme sadness, tearfulness, or feelings of hopelessness.  Denies any changes in concentration, making decisions or remembering things. Not having anxiety very often.  When she first started teaching she did need to take the Xanax a little more often but not lately.  Sleeps well most of the time.  Denies suicidal or homicidal thoughts. ? ?Patient denies increased energy with decreased need for sleep, no increased talkativeness, no racing thoughts, no impulsivity or risky behaviors, no increased spending, no increased libido, no grandiosity, no increased irritability or anger, no paranoia, and no hallucinations. ? ?Denies dizziness, syncope, seizures, numbness, tingling, tremor, tics, unsteady gait, slurred speech, confusion. Denies muscle or joint pain, stiffness, or dystonia. Denies unexplained weight loss, frequent infections, or sores that heal slowly.  No polyphagia, polydipsia, or polyuria. Denies visual changes or paresthesias.  ? ? ?Individual Medical History/ Review of Systems: Changes? :No    ? ?Past medications for mental health diagnoses include: ?Lexapro, lithium, Xanax, Modafanil ? ?Allergies: Penicillins, Pravastatin, and Rosuvastatin ? ?Current Medications:  ?Current Outpatient Medications:  ?  acyclovir ointment (ZOVIRAX) 5 %, Apply 1 application topically every 3 (three) hours., Disp: 5 g, Rfl: 2 ?  albuterol  (PROAIR HFA) 108 (90 Base) MCG/ACT inhaler, Inhale 2 puffs into the lungs every 6 (six) hours as needed for wheezing., Disp: 1 each, Rfl: 1 ?  aspirin EC 81 MG tablet, Take 1 tablet (81 mg total) by mouth daily., Disp: , Rfl:  ?  atorvastatin (LIPITOR) 10 MG tablet, Take 1 tablet (10 mg total) by mouth daily., Disp: 90 tablet, Rfl: 3 ?  Cholecalciferol (VITAMIN D) 2000 units tablet, Take 4,000 Units by mouth in the morning and at bedtime., Disp: , Rfl:  ?  doxylamine, Sleep, (UNISOM) 25 MG tablet, Take 25 mg by mouth at bedtime as needed., Disp: , Rfl:  ?  escitalopram (LEXAPRO) 20 MG tablet, Take 1 tablet (20 mg total) by mouth daily., Disp: 90 tablet, Rfl: 3 ?  ezetimibe (ZETIA) 10 MG tablet, Take 1 tablet (10 mg total) by mouth daily., Disp: 90 tablet, Rfl: 3 ?  famotidine (PEPCID) 20 MG tablet, TAKE 1 TABLET AT BEDTIME, Disp: 90 tablet, Rfl: 3 ?  fenofibrate 160 MG tablet, Take 1 tablet (160 mg total) by mouth daily., Disp: 90 tablet, Rfl: 2 ?  HYDROcodone bit-homatropine (HYCODAN) 5-1.5 MG/5ML syrup, Take 5 mLs by mouth every 8 (eight) hours as needed for cough., Disp: 120 mL, Rfl: 0 ?  levothyroxine (SYNTHROID) 75 MCG tablet, Take 1 tablet (75 mcg total) by mouth daily before breakfast., Disp: 90 tablet, Rfl: 3 ?  loperamide (IMODIUM A-D) 2 MG tablet, Take 2 mg by mouth daily., Disp: , Rfl:  ?  Melatonin 3 MG CAPS, Take by mouth., Disp: , Rfl:  ?  modafinil (PROVIGIL) 200 MG tablet, take one-half tablet by mouth every day, Disp: 30 tablet, Rfl: 0 ?  Multiple Vitamins-Minerals (MULTIVITAMIN WITH  MINERALS) tablet, Take 1 tablet by mouth daily., Disp: , Rfl:  ?  valACYclovir (VALTREX) 500 MG tablet, TAKE 1 TABLET DAILY FOR    SUPPRESSION AND 1 TABLET   TWO TIMES A DAY FOR 3 DAYS FOR OUTBREAK., Disp: 90 tablet, Rfl: 0 ?  ALPRAZolam (XANAX) 0.5 MG tablet, Take 1/2 to 1 tablet (0.25-0.5 mg total) by mouth 2 times daily as needed for anxiety., Disp: 30 tablet, Rfl: 1 ?  Ginkgo Biloba 40 MG TABS, Take 1 tablet by  mouth in the morning and at bedtime., Disp: , Rfl:  ?  lurasidone (LATUDA) 20 MG TABS tablet, TAKE 1 TABLET DAILY WITH   SUPPER, Disp: 90 tablet, Rfl: 3 ?  metoprolol tartrate (LOPRESSOR) 100 MG tablet, Take 1 tablet (100 mg total) by mouth once for 1 dose. Take 90-120 minutes prior to scan., Disp: 1 tablet, Rfl: 0 ?  pantoprazole (PROTONIX) 40 MG tablet, TAKE 1 TABLET DAILY (HAS AN APPOINTMENT SCHEDULED IN AUGUST) (Patient not taking: Reported on 11/06/2021), Disp: 90 tablet, Rfl: 3 ?Medication Side Effects: none ? ?Family Medical/ Social History: Changes? Is now a Physiological scientist at [employer], also Office manager in Psychologist, occupational.  ? ?MENTAL HEALTH EXAM: ? ?Last menstrual period 12/09/2013.There is no height or weight on file to calculate BMI.  ?General Appearance: Casual and Well Groomed  ?Eye Contact:  Good  ?Speech:  Clear and Coherent and Normal Rate  ?Volume:  Normal  ?Mood:  Euthymic  ?Affect:  Congruent  ?Thought Process:  Goal Directed and Descriptions of Associations: Circumstantial  ?Orientation:  Full (Time, Place, and Person)  ?Thought Content: Logical   ?Suicidal Thoughts:  No  ?Homicidal Thoughts:  No  ?Memory:  WNL  ?Judgement:  Good  ?Insight:  Good  ?Psychomotor Activity:  Normal  ?Concentration:  Concentration: Good and Attention Span: Fair  ?Recall:  Good  ?Fund of Knowledge: Good  ?Language: Good  ?Assets:  Desire for Improvement  ?ADL's:  Intact  ?Cognition: WNL  ?Prognosis:  Good  ? ?Labs 04/11/2021 ?Lithium level 0.8 ?CMP sodium 136 creatinine 1.05 BUN 31, glucose 79, LFTs were completely normal ?Hemoglobin A1c 6.0 ?CBC with differential, white blood count 11.7, hemoglobin 11.9, platelets 475 ?Lipid panel completely normal ?TSH 3.23 ? ?07/10/2021  ?BMP glucose 104, calcium 10.8, otherwise normal. ? ?DIAGNOSES:  ?  ICD-10-CM   ?1. Bipolar I disorder (HCC)  F31.9   ?  ?2. Generalized anxiety disorder  F41.1   ?  ?3. Insomnia, unspecified type  G47.00   ?   ? ? ?Receiving Psychotherapy: No  ? ? ?RECOMMENDATIONS:  ?PDMP was reviewed.  Last modafinil 07/27/2021.   ?I provided 30 minutes of face to face time during this encounter, including time spent before and after the visit in records review, medical decision making, counseling pertinent to today's visit, and charting.  ?Congratulations on her new career! ?She is doing well as far as her mental health medications go so no changes will be made. ? ?Continue Latuda 20 mg, 1 po with evening meal, at least 350 cal.  ?Continue Xanax 0.5 mg, 1/2-1 p.o. twice daily as needed. ?Continue Synthroid 75 mcg every morning. ?Continue Lexapro 20 mg, 1 p.o. every morning. ?Continue modafinil 200 mg, 1/4 -1/2 every morning as needed. ?Return in 4 months. ? ?Melony Overly, PA-C  ?

## 2021-12-25 ENCOUNTER — Encounter: Payer: Self-pay | Admitting: Cardiology

## 2021-12-26 MED ORDER — EZETIMIBE 10 MG PO TABS: 10.0000 mg | ORAL_TABLET | Freq: Every day | ORAL | 3 refills | Status: DC

## 2021-12-29 ENCOUNTER — Other Ambulatory Visit: Payer: Self-pay | Admitting: Obstetrics and Gynecology

## 2021-12-29 ENCOUNTER — Other Ambulatory Visit (HOSPITAL_COMMUNITY): Payer: Self-pay | Admitting: Obstetrics and Gynecology

## 2021-12-29 DIAGNOSIS — N950 Postmenopausal bleeding: Secondary | ICD-10-CM

## 2022-01-24 ENCOUNTER — Telehealth: Payer: Self-pay

## 2022-01-24 NOTE — Telephone Encounter (Signed)
Prior Authorization initiated with RX Benefits ID# [id number], EOC [id number], renewal for LURASIDONE 20 MG

## 2022-01-24 NOTE — Telephone Encounter (Signed)
A PA was submitted per request of pharmacy but Rx Benefits says its not due yet. Paid claim on file for 3 months for Lurasidone 20 mg

## 2022-03-02 ENCOUNTER — Other Ambulatory Visit: Payer: Self-pay | Admitting: Internal Medicine

## 2022-03-02 MED ORDER — VALACYCLOVIR HCL 500 MG PO TABS: ORAL_TABLET | ORAL | 0 refills | Status: DC

## 2022-03-08 ENCOUNTER — Other Ambulatory Visit: Payer: Self-pay

## 2022-03-08 ENCOUNTER — Encounter (HOSPITAL_BASED_OUTPATIENT_CLINIC_OR_DEPARTMENT_OTHER): Payer: Self-pay | Admitting: Obstetrics and Gynecology

## 2022-03-08 ENCOUNTER — Ambulatory Visit: Payer: BC Managed Care – PPO | Admitting: Physician Assistant

## 2022-03-08 NOTE — Progress Notes (Signed)
Spoke w/ via phone for pre-op interview---pt Lab needs dos---- cbc              Lab results------ COVID test -----patient states asymptomatic no test needed Arrive at -------530 am 03-16-2022 NPO after MN NO Solid Food.  Clear liquids from MN until---430 am Med rec completed Medications to take morning of surgery -----albuterol inhaler prn/bring inhaler, escitalopram, levothyroxine, valtrex Diabetic medication -----n/a Patient instructed no nail polish to be worn day of surgery Patient instructed to bring photo id and insurance card day of surgery Patient aware to have Driver (ride ) / caregiver  husband mark   for 24 hours after surgery  Patient Special Instructions -----none Pre-Op special Istructions -----none Patient verbalized understanding of instructions that were given at this phone interview. Patient denies shortness of breath, chest pain, fever, cough at this phone interview.   [facility] cardiology dr Laurance Flatten 07-10-2021 epic Ekg 07-10-2021 chart/epic Ct coronary 07-20-2021 epic

## 2022-03-12 ENCOUNTER — Ambulatory Visit: Payer: BC Managed Care – PPO | Admitting: Physician Assistant

## 2022-03-12 ENCOUNTER — Encounter: Payer: Self-pay | Admitting: Physician Assistant

## 2022-03-12 DIAGNOSIS — F319 Bipolar disorder, unspecified: Secondary | ICD-10-CM

## 2022-03-12 DIAGNOSIS — F411 Generalized anxiety disorder: Secondary | ICD-10-CM | POA: Diagnosis not present

## 2022-03-12 DIAGNOSIS — G4700 Insomnia, unspecified: Secondary | ICD-10-CM

## 2022-03-12 MED ORDER — MODAFINIL 200 MG PO TABS: 100.0000 mg | ORAL_TABLET | Freq: Every day | ORAL | 1 refills | Status: DC

## 2022-03-12 MED ORDER — ESCITALOPRAM OXALATE 20 MG PO TABS: 20.0000 mg | ORAL_TABLET | Freq: Every day | ORAL | 3 refills | Status: DC

## 2022-03-12 MED ORDER — LURASIDONE HCL 20 MG PO TABS: ORAL_TABLET | ORAL | 3 refills | Status: DC

## 2022-03-12 NOTE — Progress Notes (Signed)
[facility]  Patient ID: Lisa Lee,  MRN: [id number]  PCP: Madelin Headings, MD  Date of Evaluation: 03/12/2022 Time spent:20 minutes  Chief Complaint:  Chief Complaint   Anxiety; Depression; Insomnia; Follow-up    HISTORY/CURRENT STATUS: For routine med check.  She is doing well with current medications. Patient is able to enjoy things.  Energy and motivation are good.  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  Really enjoys that.  No extreme sadness, tearfulness, or feelings of hopelessness.  Has insomnia which gets worse when she is anxious.  It has been worse the past few weeks, anticipating going back to work.  She takes Unisom, melatonin, and sometimes Xanax to help.  ADLs and personal hygiene are normal.   Denies any changes in concentration, making decisions, or remembering things.  Appetite has not changed.  Weight is stable. Denies suicidal or homicidal thoughts.  Patient denies increased energy with decreased need for sleep, increased talkativeness, racing thoughts, impulsivity or risky behaviors, increased spending, increased libido, grandiosity, increased irritability or anger, paranoia, and no hallucinations.  Still experiences anxiety at times.  No panic attacks, but more a generalized sense of unease, like something bad may happen at any time.  She takes the Xanax sparingly, it is effective when needed though.  Denies dizziness, syncope, seizures, numbness, tingling, tremor, tics, unsteady gait, slurred speech, confusion. Denies muscle or joint pain, stiffness, or dystonia. Denies unexplained weight loss, frequent infections, or sores that heal slowly.  No polyphagia, polydipsia, or polyuria. Denies visual changes or paresthesias.   Individual Medical History/ Review of Systems: Changes? :Yes    has a URI right now.  Will have D&C on Friday  Past medications for mental  health diagnoses include: Lexapro, lithium, Xanax, Modafanil  Allergies: Penicillins, Pravastatin, and Rosuvastatin  Current Medications:  Current Outpatient Medications:    albuterol (PROAIR HFA) 108 (90 Base) MCG/ACT inhaler, Inhale 2 puffs into the lungs every 6 (six) hours as needed for wheezing., Disp: 1 each, Rfl: 1   ALPRAZolam (XANAX) 0.5 MG tablet, Take 1/2 to 1 tablet (0.25-0.5 mg total) by mouth 2 times daily as needed for anxiety., Disp: 30 tablet, Rfl: 1   aspirin EC 81 MG tablet, Take 1 tablet (81 mg total) by mouth daily., Disp: , Rfl:    atorvastatin (LIPITOR) 10 MG tablet, Take 1 tablet (10 mg total) by mouth daily. (Patient taking differently: Take 10 mg by mouth at bedtime.), Disp: 90 tablet, Rfl: 3   Cholecalciferol (VITAMIN D) 2000 units tablet, Take 2,000 Units by mouth in the morning and at bedtime., Disp: , Rfl:    doxylamine, Sleep, (UNISOM) 25 MG tablet, Take 25 mg by mouth at bedtime., Disp: , Rfl:    ezetimibe (ZETIA) 10 MG tablet, Take 1 tablet (10 mg total) by mouth daily. (Patient taking differently: Take 10 mg by mouth at bedtime.), Disp: 90 tablet, Rfl: 3   famotidine (PEPCID) 20 MG tablet, TAKE 1 TABLET AT BEDTIME, Disp: 90 tablet, Rfl: 3   fenofibrate 160 MG tablet, Take 1 tablet (160 mg total) by mouth daily. (Patient taking differently: Take 160 mg by mouth at bedtime.), Disp: 90 tablet, Rfl: 2   levothyroxine (SYNTHROID) 75 MCG tablet, Take 1 tablet (75 mcg total) by mouth daily before breakfast., Disp: 90 tablet, Rfl: 3   loperamide (IMODIUM A-D) 2 MG tablet, Take 2 mg by mouth as needed., Disp: ,  Rfl:    Melatonin 3 MG CAPS, Take 5 mg by mouth at bedtime., Disp: , Rfl:    Multiple Vitamins-Minerals (MULTIVITAMIN WITH MINERALS) tablet, Take 1 tablet by mouth daily., Disp: , Rfl:    valACYclovir (VALTREX) 500 MG tablet, TAKE 1 TABLET DAILY FOR    SUPPRESSION AND 1 TABLET   TWO TIMES A DAY FOR 3 DAYS FOR OUTBREAK., Disp: 90 tablet, Rfl: 0   escitalopram  (LEXAPRO) 20 MG tablet, Take 1 tablet (20 mg total) by mouth daily., Disp: 90 tablet, Rfl: 3   lurasidone (LATUDA) 20 MG TABS tablet, TAKE 1 TABLET DAILY WITH   SUPPER, Disp: 90 tablet, Rfl: 3   modafinil (PROVIGIL) 200 MG tablet, Take 0.5 tablets (100 mg total) by mouth daily., Disp: 30 tablet, Rfl: 1 Medication Side Effects: none  Family Medical/ Social History: Changes?  No  MENTAL HEALTH EXAM:  Last menstrual period 12/09/2013.There is no height or weight on file to calculate BMI.  General Appearance: Casual and Well Groomed  Eye Contact:  Good  Speech:  Clear and Coherent and Normal Rate  Volume:  Normal  Mood:  Euthymic  Affect:  Congruent  Thought Process:  Goal Directed and Descriptions of Associations: Circumstantial  Orientation:  Full (Time, Place, and Person)  Thought Content: Logical   Suicidal Thoughts:  No  Homicidal Thoughts:  No  Memory:  WNL  Judgement:  Good  Insight:  Good  Psychomotor Activity:  Normal  Concentration:  Concentration: Good and Attention Span: Good  Recall:  Good  Fund of Knowledge: Good  Language: Good  Assets:  Desire for Improvement Financial Resources/Insurance Housing Transportation Vocational/Educational  ADL's:  Intact  Cognition: WNL  Prognosis:  Good   DIAGNOSES:    ICD-10-CM   1. Bipolar I disorder (HCC)  F31.9     2. Generalized anxiety disorder  F41.1     3. Insomnia, unspecified type  G47.00       Receiving Psychotherapy: No   RECOMMENDATIONS:  PDMP was reviewed.  Last modafinil 07/27/2021.  Xanax filled 11/06/2021. I provided 20 minutes of face to face time during this encounter, including time spent before and after the visit in records review, medical decision making, counseling pertinent to today's visit, and charting.   Doing well so no changes in meds.  Continue Xanax 0.5 mg, 1/2-1 p.o. twice daily as needed. Continue Lexapro 20 mg, 1 p.o. every morning. Continue Latuda 20 mg, 1 po with evening meal, at  least 350 cal.  Continue modafinil 200 mg, 1/4 -1/2 every morning as needed. Return in 5 months.  (Because of her off school schedule.)  Melony Overly, PA-C

## 2022-03-15 NOTE — H&P (Addendum)
Lisa Lee is an 61 y.o. female G74P2 presenting for scheduled hysteroscopy/endometrial sampling/evacuation of fluid collection noted on [place] after some vaginal spotting.       No more VB this summer, no pain but some hot flashes. She has been menopausal since her mid-fifties.   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 Ovaries ok, one small cyst on right--less than 1 cm.   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  Pap 2022 was neg/neg.  Pt desires to have definitive sampling of uterine cavity under anesthesia, with evacuation of fluid if possible     Menstrual History:  Patient's last menstrual period was 12/09/2013.    Past Medical History:  Diagnosis Date   Acid reflux    Asthma    Bipolar disorder (HCC)    Depression    Diarrhea 08/09/2011   Poss ibs  Vs other  Related to stress  consdier other eval if needed. Had colonoscoopy per dr Matthias Hughs    High cholesterol    History of hypertension    no meds for last year  per pt on 03-08-2022   HPV in female 1984   Hypothyroidism    PMB (postmenopausal bleeding)    Thyroid disease     Past Surgical History:  Procedure Laterality Date   CERVICAL CONIZATION W/BX N/A 07/21/2015   Procedure: CONIZATION CERVIX WITH BIOPSY;  Surgeon: Kirkland Hun, MD;  Location: [facility];  Service: Gynecology;  Laterality: N/A;   CERVICAL CONIZATION W/BX N/A 09/25/2018   Procedure: CONIZATION CERVIX WITH BIOPSY;  Surgeon: Adolphus Birchwood, MD;  Location: [facility];  Service: Gynecology;  Laterality: N/A;   CERVICAL CRYOTHERAPY  07/30/1982   colonscopy  07/04/2018   COLPOSCOPY  08/26/2013   DILATION AND CURETTAGE, DIAGNOSTIC / THERAPEUTIC  07/30/1992   Blighted Ovum   HYSTEROSCOPY     uterine polypectomy Jan 7th    HYSTEROSCOPY WITH RESECTOSCOPE  08/04/2009   Removed polyp & IUD   LYMPH NODE BIOPSY     40 yrs ago   TONSILLECTOMY     as child   UPPER GI ENDOSCOPY  07/04/2018    Family History  Problem Relation Age of Onset   Heart disease Sister 17       cabg stent   Arthritis Mother    Heart disease Mother    Heart disease Father    Colon cancer Father    Parkinson's disease Father    Pulmonary embolism Daughter    Heart disease Sister    Thyroid disease Sister    Heart disease Brother    Thyroid disease Daughter    Rectal cancer Neg Hx     Social History:  reports that she has never smoked. She has never used smokeless tobacco. She reports current alcohol use. She reports that she does not use drugs.  Allergies:  Allergies  Allergen Reactions   Penicillins Hives, Shortness Of Breath and Swelling    Has patient had a PCN reaction causing immediate rash, facial/tongue/throat swelling, SOB or lightheadedness with hypotension: Yes Has patient had a PCN reaction causing severe rash involving mucus membranes or skin necrosis: No Has patient had a PCN reaction that required hospitalization No Has patient had a PCN reaction occurring within the last 10  years: No If all of the above answers are "NO", then may proceed with Cephalosporin use.    Pravastatin Other (See Comments)    Cause muscle aches and cramps   Rosuvastatin Other (See Comments)    Caused pain in hands, shoulders, back, and indigestion    No medications prior to admission.    Review of Systems  Gastrointestinal:  Negative for abdominal pain.  Genitourinary:  Positive for vaginal bleeding. Negative for vaginal pain.    Height 5' 0.5" (1.537 m), weight 60.8 kg, last menstrual period 12/09/2013. Physical Exam Cardiovascular:     Rate and Rhythm: Normal rate and regular rhythm.  Pulmonary:     Effort: Pulmonary effort is normal.  Abdominal:     Palpations: Abdomen is soft.  Genitourinary:    General: Normal vulva.   Neurological:     Mental Status: She is alert.  Psychiatric:        Mood and Affect: Mood normal.     No results found for this or any previous visit (from the past 24 hour(s)).  No results found.  Assessment/Plan: The patient was counseled regarding the hysteroscopy procedure in detail.  We reviewed risks of bleeding and infection and possible uterine perforation.  We also discussed the removal of any identified luid, polyps or submucosal fibroids and what that would involve.  The patient desires to proceed.  She will use cytotec 3 hours prior to the procedure to aid with cervical dilation. We discussed given her stenosis, it is possible we may not be able to get through to the cavity.   Oliver Pila 03/15/2022, 8:07 PM

## 2022-03-16 ENCOUNTER — Encounter (HOSPITAL_BASED_OUTPATIENT_CLINIC_OR_DEPARTMENT_OTHER)
Admission: RE | Disposition: A | Discharge status: Home / Self Care | Payer: Self-pay | Source: Ambulatory Visit | Attending: Obstetrics and Gynecology

## 2022-03-16 ENCOUNTER — Encounter: Payer: Self-pay | Admitting: Internal Medicine

## 2022-03-16 ENCOUNTER — Other Ambulatory Visit: Payer: Self-pay

## 2022-03-16 ENCOUNTER — Encounter (HOSPITAL_BASED_OUTPATIENT_CLINIC_OR_DEPARTMENT_OTHER): Payer: Self-pay | Admitting: Obstetrics and Gynecology

## 2022-03-16 ENCOUNTER — Ambulatory Visit (HOSPITAL_BASED_OUTPATIENT_CLINIC_OR_DEPARTMENT_OTHER): Payer: BC Managed Care – PPO | Admitting: Anesthesiology

## 2022-03-16 ENCOUNTER — Ambulatory Visit (HOSPITAL_COMMUNITY)
Admission: RE | Admit: 2022-03-16 | Discharge: 2022-03-16 | Disposition: A | Discharge status: Home / Self Care | Payer: BC Managed Care – PPO | Source: Ambulatory Visit | Attending: Obstetrics and Gynecology | Admitting: Obstetrics and Gynecology

## 2022-03-16 ENCOUNTER — Ambulatory Visit (HOSPITAL_BASED_OUTPATIENT_CLINIC_OR_DEPARTMENT_OTHER)
Admission: RE | Admit: 2022-03-16 | Discharge: 2022-03-16 | Disposition: A | Discharge status: Home / Self Care | Payer: BC Managed Care – PPO | Source: Ambulatory Visit | Attending: Obstetrics and Gynecology | Admitting: Obstetrics and Gynecology

## 2022-03-16 DIAGNOSIS — Z01818 Encounter for other preprocedural examination: Secondary | ICD-10-CM

## 2022-03-16 DIAGNOSIS — J45909 Unspecified asthma, uncomplicated: Secondary | ICD-10-CM | POA: Diagnosis not present

## 2022-03-16 DIAGNOSIS — K219 Gastro-esophageal reflux disease without esophagitis: Secondary | ICD-10-CM | POA: Diagnosis not present

## 2022-03-16 DIAGNOSIS — N882 Stricture and stenosis of cervix uteri: Secondary | ICD-10-CM | POA: Insufficient documentation

## 2022-03-16 DIAGNOSIS — E039 Hypothyroidism, unspecified: Secondary | ICD-10-CM | POA: Diagnosis not present

## 2022-03-16 DIAGNOSIS — N950 Postmenopausal bleeding: Secondary | ICD-10-CM

## 2022-03-16 DIAGNOSIS — Z780 Asymptomatic menopausal state: Secondary | ICD-10-CM | POA: Insufficient documentation

## 2022-03-16 HISTORY — DX: Bipolar disorder, unspecified: F31.9

## 2022-03-16 HISTORY — PX: DILATATION & CURETTAGE/HYSTEROSCOPY WITH MYOSURE: SHX6511

## 2022-03-16 HISTORY — PX: OPERATIVE ULTRASOUND: SHX5996

## 2022-03-16 HISTORY — DX: Personal history of other diseases of the circulatory system: Z86.79

## 2022-03-16 HISTORY — DX: Postmenopausal bleeding: N95.0

## 2022-03-16 LAB — CBC
HCT: 42.4 % (ref 36.0–46.0)
Hemoglobin: 14.3 g/dL (ref 12.0–15.0)
MCH: 31.3 pg (ref 26.0–34.0)
MCHC: 33.7 g/dL (ref 30.0–36.0)
MCV: 92.8 fL (ref 80.0–100.0)
Platelets: 317 10*3/uL (ref 150–400)
RBC: 4.57 MIL/uL (ref 3.87–5.11)
RDW: 11.8 % (ref 11.5–15.5)
WBC: 8.5 10*3/uL (ref 4.0–10.5)
nRBC: 0 % (ref 0.0–0.2)

## 2022-03-16 SURGERY — DILATATION & CURETTAGE/HYSTEROSCOPY WITH MYOSURE
Anesthesia: General | Site: Pelvis

## 2022-03-16 MED ORDER — MIDAZOLAM HCL 2 MG/2ML IJ SOLN
INTRAMUSCULAR | Status: AC
  Filled 2022-03-16: qty 2

## 2022-03-16 MED ORDER — EPHEDRINE 5 MG/ML INJ
INTRAVENOUS | Status: AC
  Filled 2022-03-16: qty 5

## 2022-03-16 MED ORDER — ACETAMINOPHEN 500 MG PO TABS
1000.0000 mg | ORAL_TABLET | Freq: Once | ORAL | Status: AC
  Administered 2022-03-16: 1000 mg via ORAL

## 2022-03-16 MED ORDER — OXYCODONE HCL 5 MG/5ML PO SOLN: 5.0000 mg | Freq: Once | ORAL | Status: DC | PRN

## 2022-03-16 MED ORDER — FENTANYL CITRATE (PF) 100 MCG/2ML IJ SOLN: 25.0000 ug | INTRAMUSCULAR | Status: DC | PRN

## 2022-03-16 MED ORDER — ONDANSETRON HCL 4 MG/2ML IJ SOLN
INTRAMUSCULAR | Status: AC
  Filled 2022-03-16: qty 2

## 2022-03-16 MED ORDER — ACETAMINOPHEN 500 MG PO TABS
ORAL_TABLET | ORAL | Status: AC
  Filled 2022-03-16: qty 2

## 2022-03-16 MED ORDER — PROPOFOL 10 MG/ML IV BOLUS
INTRAVENOUS | Status: DC | PRN
  Administered 2022-03-16: 150 mg via INTRAVENOUS

## 2022-03-16 MED ORDER — LACTATED RINGERS IV SOLN: INTRAVENOUS | Status: DC

## 2022-03-16 MED ORDER — ONDANSETRON HCL 4 MG/2ML IJ SOLN
INTRAMUSCULAR | Status: DC | PRN
  Administered 2022-03-16: 4 mg via INTRAVENOUS

## 2022-03-16 MED ORDER — LIDOCAINE 2% (20 MG/ML) 5 ML SYRINGE
INTRAMUSCULAR | Status: DC | PRN
  Administered 2022-03-16: 60 mg via INTRAVENOUS

## 2022-03-16 MED ORDER — SODIUM CHLORIDE 0.9 % IR SOLN
Status: DC | PRN
  Administered 2022-03-16: 3000 mL

## 2022-03-16 MED ORDER — LIDOCAINE HCL 1 % IJ SOLN
INTRAMUSCULAR | Status: DC | PRN
  Administered 2022-03-16: 20 mL

## 2022-03-16 MED ORDER — CELECOXIB 200 MG PO CAPS
ORAL_CAPSULE | ORAL | Status: AC
  Filled 2022-03-16: qty 1

## 2022-03-16 MED ORDER — DOXYCYCLINE HYCLATE 50 MG PO CAPS: 100.0000 mg | ORAL_CAPSULE | Freq: Two times a day (BID) | ORAL | 0 refills | Status: AC

## 2022-03-16 MED ORDER — FENTANYL CITRATE (PF) 100 MCG/2ML IJ SOLN
INTRAMUSCULAR | Status: AC
  Filled 2022-03-16: qty 2

## 2022-03-16 MED ORDER — ARTIFICIAL TEARS OPHTHALMIC OINT
TOPICAL_OINTMENT | OPHTHALMIC | Status: AC
  Filled 2022-03-16: qty 3.5

## 2022-03-16 MED ORDER — POVIDONE-IODINE 10 % EX SWAB: 2.0000 | Freq: Once | CUTANEOUS | Status: DC

## 2022-03-16 MED ORDER — LIDOCAINE HCL (PF) 2 % IJ SOLN
INTRAMUSCULAR | Status: AC
  Filled 2022-03-16: qty 5

## 2022-03-16 MED ORDER — DEXAMETHASONE SODIUM PHOSPHATE 10 MG/ML IJ SOLN
INTRAMUSCULAR | Status: AC
  Filled 2022-03-16: qty 1

## 2022-03-16 MED ORDER — PROPOFOL 10 MG/ML IV BOLUS
INTRAVENOUS | Status: AC
  Filled 2022-03-16: qty 20

## 2022-03-16 MED ORDER — OXYCODONE HCL 5 MG PO TABS: 5.0000 mg | ORAL_TABLET | Freq: Once | ORAL | Status: DC | PRN

## 2022-03-16 MED ORDER — CELECOXIB 200 MG PO CAPS
200.0000 mg | ORAL_CAPSULE | Freq: Once | ORAL | Status: AC
  Administered 2022-03-16: 200 mg via ORAL

## 2022-03-16 MED ORDER — POTASSIUM CHLORIDE 2 MEQ/ML IV SOLN
INTRAVENOUS | Status: DC
  Filled 2022-03-16 (×2): qty 1000

## 2022-03-16 MED ORDER — MIDAZOLAM HCL 2 MG/2ML IJ SOLN
INTRAMUSCULAR | Status: DC | PRN
  Administered 2022-03-16: 2 mg via INTRAVENOUS

## 2022-03-16 MED ORDER — DEXAMETHASONE SODIUM PHOSPHATE 10 MG/ML IJ SOLN
INTRAMUSCULAR | Status: DC | PRN
  Administered 2022-03-16: 10 mg via INTRAVENOUS

## 2022-03-16 MED ORDER — FENTANYL CITRATE (PF) 100 MCG/2ML IJ SOLN
INTRAMUSCULAR | Status: DC | PRN
Start: 2022-03-16 — End: 2022-03-16
  Administered 2022-03-16 (×2): 50 ug via INTRAVENOUS

## 2022-03-16 MED ORDER — PROMETHAZINE HCL 25 MG/ML IJ SOLN: 6.2500 mg | INTRAMUSCULAR | Status: DC | PRN

## 2022-03-16 MED ORDER — EPHEDRINE SULFATE-NACL 50-0.9 MG/10ML-% IV SOSY
PREFILLED_SYRINGE | INTRAVENOUS | Status: DC | PRN
  Administered 2022-03-16: 10 mg via INTRAVENOUS
  Administered 2022-03-16: 15 mg via INTRAVENOUS

## 2022-03-16 SURGICAL SUPPLY — 21 items
CATH ROBINSON RED A/P 16FR (CATHETERS) ×1 IMPLANT
DEVICE MYOSURE LITE (MISCELLANEOUS) IMPLANT
DEVICE MYOSURE REACH (MISCELLANEOUS) ×1 IMPLANT
DILATOR CANAL MILEX (MISCELLANEOUS) IMPLANT
DRSG TELFA 3X8 NADH (GAUZE/BANDAGES/DRESSINGS) ×1 IMPLANT
GAUZE 4X4 16PLY ~~LOC~~+RFID DBL (SPONGE) ×2 IMPLANT
GLOVE BIO SURGEON STRL SZ 6.5 (GLOVE) ×1 IMPLANT
GLOVE BIO SURGEON STRL SZ7 (GLOVE) ×1 IMPLANT
GOWN STRL REUS W/TWL LRG LVL3 (GOWN DISPOSABLE) ×1 IMPLANT
IV NS IRRIG 3000ML ARTHROMATIC (IV SOLUTION) ×1 IMPLANT
KIT PROCEDURE FLUENT (KITS) ×1 IMPLANT
KIT TURNOVER CYSTO (KITS) ×1 IMPLANT
MYOSURE XL FIBROID (MISCELLANEOUS)
NDL SPNL 22GX3.5 QUINCKE BK (NEEDLE) ×1 IMPLANT
NEEDLE SPNL 22GX3.5 QUINCKE BK (NEEDLE) ×1 IMPLANT
PACK VAGINAL MINOR WOMEN LF (CUSTOM PROCEDURE TRAY) ×1 IMPLANT
PAD DRESSING TELFA 3X8 NADH (GAUZE/BANDAGES/DRESSINGS) ×1 IMPLANT
PAD OB MATERNITY 4.3X12.25 (PERSONAL CARE ITEMS) ×1 IMPLANT
SEAL CERVICAL OMNI LOK (ABLATOR) IMPLANT
SEAL ROD LENS SCOPE MYOSURE (ABLATOR) ×1 IMPLANT
SYSTEM TISS REMOVAL MYOSURE XL (MISCELLANEOUS) IMPLANT

## 2022-03-16 NOTE — Discharge Instructions (Addendum)
DISCHARGE INSTRUCTIONS: HYSTEROSCOPY / ENDOMETRIAL ABLATION The following instructions have been prepared to help you care for yourself upon your return home.   May take stool softner while taking narcotic pain medication to prevent constipation.  Drink plenty of water.  Personal hygiene:  Use sanitary pads for vaginal drainage, not tampons.  Shower the day after your procedure.  NO tub baths, pools or Jacuzzis for 2-3 weeks.  Wipe front to back after using the bathroom.  Activity and limitations:  Do NOT drive or operate any equipment for 24 hours. The effects of anesthesia are still present and drowsiness may result.  Do NOT rest in bed all day.  Walking is encouraged.  Walk up and down stairs slowly.  You may resume your normal activity in one to two days or as indicated by your physician. Sexual activity: NO intercourse for at least 2 weeks after the procedure, or as indicated by your Doctor.  Diet: Eat a light meal as desired this evening. You may resume your usual diet tomorrow.  Return to Work: You may resume your work activities in one to two days or as indicated by Therapist, sports.  What to expect after your surgery: Expect to have vaginal bleeding/discharge for 2-3 days and spotting for up to 10 days. It is not unusual to have soreness for up to 1-2 weeks. You may have a slight burning sensation when you urinate for the first day. Mild cramps may continue for a couple of days. You may have a regular period in 2-6 weeks.  Call your doctor for any of the following:  Excessive vaginal bleeding or clotting, saturating and changing one pad every hour.  Inability to urinate 6 hours after discharge from hospital.  Pain not relieved by pain medication.  Fever of 100.4 F or greater.  Unusual vaginal discharge or odor.   Post Anesthesia Home Care Instructions  Activity: Get plenty of rest for the remainder of the day. A responsible adult should stay with you for 24 hours  following the procedure.  For the next 24 hours, DO NOT: -Drive a car -Advertising copywriter -Drink alcoholic beverages -Take any medication unless instructed by your physician -Make any legal decisions or sign important papers.  Meals: Start with liquid foods such as gelatin or soup. Progress to regular foods as tolerated. Avoid greasy, spicy, heavy foods. If nausea and/or vomiting occur, drink only clear liquids until the nausea and/or vomiting subsides. Call your physician if vomiting continues.  Special Instructions/Symptoms: Your throat may feel dry or sore from the anesthesia or the breathing tube placed in your throat during surgery. If this causes discomfort, gargle with warm salt water. The discomfort should disappear within 24 hours.  No acetaminophen/Tylenol until after 1:30 pm today if needed. No ibuprofen, Advil, Aleve, Motrin, ketorolac, meloxicam, naproxen, or other NSAIDS until after 1:30 pm today if needed.

## 2022-03-16 NOTE — Anesthesia Procedure Notes (Signed)
Procedure Name: LMA Insertion Date/Time: 03/16/2022 7:41 AM  Performed by: Norva Pavlov, CRNAPre-anesthesia Checklist: Patient identified, Emergency Drugs available, Suction available and Patient being monitored Patient Re-evaluated:Patient Re-evaluated prior to induction Oxygen Delivery Method: Circle system utilized Preoxygenation: Pre-oxygenation with 100% oxygen Induction Type: IV induction Ventilation: Mask ventilation without difficulty LMA: LMA inserted LMA Size: 4.0 Number of attempts: 1 Airway Equipment and Method: Bite block Placement Confirmation: positive ETCO2 Tube secured with: Tape Dental Injury: Teeth and Oropharynx as per pre-operative assessment

## 2022-03-16 NOTE — Transfer of Care (Signed)
Immediate Anesthesia Transfer of Care Note  Patient: Lisa Lee  Procedure(s) Performed: Procedure(s) (LRB): ATTEMPTED DILATATION & CURETTAGE/ATTEMPTED HYSTEROSCOPY WITH MYOSURE, POLYPECTOMY (N/A) OPERATIVE ULTRASOUND (N/A)  Patient Location: PACU  Anesthesia Type: General  Level of Consciousness:sleepy  Airway & Oxygen Therapy: Patient Spontanous Breathing and Patient connected to nasal cannula oxygen  Post-op Assessment: Report given to PACU RN and Post -op Vital signs reviewed and stable  Post vital signs: Reviewed and stable  Complications: No apparent anesthesia complications  Last Vitals:  Vitals Value Taken Time  BP 125/68 03/16/22 0820  Temp    Pulse 73 03/16/22 0827  Resp 14 03/16/22 0827  SpO2 96 % 03/16/22 0827  Vitals shown include unvalidated device data.  Last Pain:  Vitals:   03/16/22 0820  TempSrc:   PainSc: Asleep      Patients Stated Pain Goal: 4 (03/16/22 0610)  Complications: No notable events documented.

## 2022-03-16 NOTE — Op Note (Signed)
Operative Note    Preoperative Diagnosis Severe cervical stenosis Fluid trapped in endometrial cavity  Postoperative Diagnosis same  Procedure Attempted hysteroscopy, unable to pass through to endometrial cavity with US guidance   Surgeon Huel Cote, MD  Anesthesia LMA  Fluids: EBL 42mL UOP straight cath clear urine after procedure IVF LR Hysteroscopic deficit  Findings No discernible cervix, just a fold at back of vagina.  Uterus was small and endometrial lining on [place] thin, a small amount of fluid noted in the cavity.  No apparent cervical os externally, just a false passage  Specimen None  Procedure Note  Patient was taken to the operating room where LMA anesthesia was obtained without difficulty. She was then prepped and draped in the normal sterile fashion in the dorsal lithotomy position. An appropriate time out was performed. A speculum was then placed within the vagina and there was no discernible cervix, just a fold in the back of the vagina.  The anterior portion of the fold was injected with approximately 2 cc of 1% plain lidocaine, then grasped with a tenaculum.   An additional 9 cc each was placed at 2 and 10:00 for a local block.  The ultrasound was then placed on the patient's abdomen and the uterine fundus identified.  The sound was gently used to try and establish a track to the cavity but did not advance easily and though went forward a bit could not get all the way to the uterine fundus.  The hysteroscope was then used to try to visualize the internal os, but was only able to follow the false track and no other opening was noted to find a path forward to the fundus.  The hysteroscopic deficit was at this point and given risk of perforation the procedure was concluded.  A vaginal Korea was performed at the end of the procedure with no significant free fluid in the pelvis, and the uterus appeared the same with the small fluid collection.   Color flow placed over the uterus revealed no active bleeders.  The tenaculum was then removed from the anterior vagina and was hemostatic.  Finally the speculum was removed from the vagina and the patient awakened and taken to the recovery room in good condition.  I feel the patient has no residual cervical track to the external os, just a false passage into the posterior left myometrium. D/w pt husband in detail after conclusion of procedure.

## 2022-03-16 NOTE — Interval H&P Note (Signed)
History and Physical Interval Note:  03/16/2022 7:06 AM  Lisa Lee  has presented today for surgery, with the diagnosis of postmenopausal bleeding.  The various methods of treatment have been discussed with the patient and family. After consideration of risks, benefits and other options for treatment, the patient has consented to  Procedure(s): DILATATION & CURETTAGE/HYSTEROSCOPY WITH MYOSURE, POLYPECTOMY (N/A) OPERATIVE ULTRASOUND (N/A) as a surgical intervention.  The patient's history has been reviewed, patient examined, no change in status, stable for surgery.  I have reviewed the patient's chart and labs.  Questions were answered to the patient's satisfaction.  She was unable to do the cytotec as the tablets fell down the sink when she moistened them this AM.   Oliver Pila

## 2022-03-16 NOTE — Anesthesia Postprocedure Evaluation (Signed)
Anesthesia Post Note  Patient: Lisa Lee  Procedure(s) Performed: ATTEMPTED DILATATION & CURETTAGE/ATTEMPTED HYSTEROSCOPY WITH MYOSURE, POLYPECTOMY (Pelvis) OPERATIVE ULTRASOUND (Pelvis)     Patient location during evaluation: PACU Anesthesia Type: General Level of consciousness: awake and alert Pain management: pain level controlled Vital Signs Assessment: post-procedure vital signs reviewed and stable Respiratory status: spontaneous breathing, nonlabored ventilation and respiratory function stable Cardiovascular status: stable and blood pressure returned to baseline Anesthetic complications: no   No notable events documented.  Last Vitals:  Vitals:   03/16/22 0845 03/16/22 0900  BP: 134/76 134/78  Pulse: 67 70  Resp: 12 16  Temp:    SpO2: 93% 96%    Last Pain:  Vitals:   03/16/22 0900  TempSrc:   PainSc: 0-No pain                 Beryle Lathe

## 2022-03-16 NOTE — Anesthesia Preprocedure Evaluation (Addendum)
Anesthesia Evaluation  Patient identified by MRN, date of birth, ID band Patient awake    Reviewed: Allergy & Precautions, NPO status , Patient's Chart, lab work & pertinent test results  History of Anesthesia Complications Negative for: history of anesthetic complications  Airway Mallampati: II  TM Distance: >3 FB Neck ROM: Full    Dental  (+) Dental Advisory Given, Teeth Intact   Pulmonary asthma ,    Pulmonary exam normal        Cardiovascular negative cardio ROS Normal cardiovascular exam     Neuro/Psych PSYCHIATRIC DISORDERS Depression Bipolar Disorder negative neurological ROS     GI/Hepatic Neg liver ROS, GERD  Controlled,  Endo/Other  Hypothyroidism   Renal/GU negative Renal ROS     Musculoskeletal negative musculoskeletal ROS (+)   Abdominal   Peds  Hematology negative hematology ROS (+)   Anesthesia Other Findings HSV  Reproductive/Obstetrics                            Anesthesia Physical Anesthesia Plan  ASA: 2  Anesthesia Plan: General   Post-op Pain Management: Tylenol PO (pre-op)* and Celebrex PO (pre-op)*   Induction: Intravenous  PONV Risk Score and Plan: 3 and Treatment may vary due to age or medical condition, Ondansetron, Dexamethasone and Midazolam  Airway Management Planned: LMA  Additional Equipment: None  Intra-op Plan:   Post-operative Plan: Extubation in OR  Informed Consent: I have reviewed the patients History and Physical, chart, labs and discussed the procedure including the risks, benefits and alternatives for the proposed anesthesia with the patient or authorized representative who has indicated his/her understanding and acceptance.     Dental advisory given  Plan Discussed with: CRNA and Anesthesiologist  Anesthesia Plan Comments:        Anesthesia Quick Evaluation

## 2022-03-18 NOTE — Telephone Encounter (Signed)
Sure If we have  them   :please check to see if we are going to et or have  a RSV vaccine  ..(please ask Lisa Lee )

## 2022-03-19 ENCOUNTER — Encounter (HOSPITAL_BASED_OUTPATIENT_CLINIC_OR_DEPARTMENT_OTHER): Payer: Self-pay | Admitting: Obstetrics and Gynecology

## 2022-04-12 LAB — HM MAMMOGRAPHY

## 2022-04-23 ENCOUNTER — Ambulatory Visit (INDEPENDENT_AMBULATORY_CARE_PROVIDER_SITE_OTHER): Payer: BC Managed Care – PPO | Admitting: Internal Medicine

## 2022-04-23 ENCOUNTER — Encounter: Payer: Self-pay | Admitting: Cardiology

## 2022-04-23 ENCOUNTER — Other Ambulatory Visit: Payer: Self-pay | Admitting: Internal Medicine

## 2022-04-23 ENCOUNTER — Encounter: Payer: Self-pay | Admitting: Internal Medicine

## 2022-04-23 VITALS — BP 122/72 | HR 60 | Temp 98.1°F | Ht 60.98 in | Wt 131.4 lb

## 2022-04-23 DIAGNOSIS — Z0000 Encounter for general adult medical examination without abnormal findings: Secondary | ICD-10-CM

## 2022-04-23 DIAGNOSIS — Z23 Encounter for immunization: Secondary | ICD-10-CM

## 2022-04-23 DIAGNOSIS — Z79899 Other long term (current) drug therapy: Secondary | ICD-10-CM | POA: Diagnosis not present

## 2022-04-23 DIAGNOSIS — E2839 Other primary ovarian failure: Secondary | ICD-10-CM

## 2022-04-23 DIAGNOSIS — E039 Hypothyroidism, unspecified: Secondary | ICD-10-CM

## 2022-04-23 DIAGNOSIS — K219 Gastro-esophageal reflux disease without esophagitis: Secondary | ICD-10-CM

## 2022-04-23 DIAGNOSIS — R7301 Impaired fasting glucose: Secondary | ICD-10-CM

## 2022-04-23 DIAGNOSIS — E785 Hyperlipidemia, unspecified: Secondary | ICD-10-CM | POA: Diagnosis not present

## 2022-04-23 LAB — BASIC METABOLIC PANEL
BUN: 30 mg/dL — ABNORMAL HIGH (ref 6–23)
CO2: 26 mEq/L (ref 19–32)
Calcium: 11.1 mg/dL — ABNORMAL HIGH (ref 8.4–10.5)
Chloride: 108 mEq/L (ref 96–112)
Creatinine, Ser: 1.12 mg/dL (ref 0.40–1.20)
GFR: 52.71 mL/min — ABNORMAL LOW (ref >60.00)
Glucose, Bld: 97 mg/dL (ref 70–99)
Potassium: 5.3 mEq/L — ABNORMAL HIGH (ref 3.5–5.1)
Sodium: 146 mEq/L — ABNORMAL HIGH (ref 135–145)

## 2022-04-23 LAB — LIPID PANEL
Cholesterol: 151 mg/dL (ref 0–200)
HDL: 61.1 mg/dL (ref >39.00)
LDL Cholesterol: 70 mg/dL (ref 0–99)
NonHDL: 90.06
Total CHOL/HDL Ratio: 2
Triglycerides: 99 mg/dL (ref 0.0–149.0)
VLDL: 19.8 mg/dL (ref 0.0–40.0)

## 2022-04-23 LAB — CBC WITH DIFFERENTIAL/PLATELET
Basophils Absolute: 0.1 10*3/uL (ref 0.0–0.1)
Basophils Relative: 0.9 % (ref 0.0–3.0)
Eosinophils Absolute: 0.3 10*3/uL (ref 0.0–0.7)
Eosinophils Relative: 4.6 % (ref 0.0–5.0)
HCT: 42.7 % (ref 36.0–46.0)
Hemoglobin: 14.4 g/dL (ref 12.0–15.0)
Lymphocytes Relative: 35.9 % (ref 12.0–46.0)
Lymphs Abs: 2.6 10*3/uL (ref 0.7–4.0)
MCHC: 33.8 g/dL (ref 30.0–36.0)
MCV: 93.6 fl (ref 78.0–100.0)
Monocytes Absolute: 0.6 10*3/uL (ref 0.1–1.0)
Monocytes Relative: 8.9 % (ref 3.0–12.0)
Neutro Abs: 3.5 10*3/uL (ref 1.4–7.7)
Neutrophils Relative %: 49.7 % (ref 43.0–77.0)
Platelets: 291 10*3/uL (ref 150.0–400.0)
RBC: 4.56 Mil/uL (ref 3.87–5.11)
RDW: 12.5 % (ref 11.5–15.5)
WBC: 7.1 10*3/uL (ref 4.0–10.5)

## 2022-04-23 LAB — HEPATIC FUNCTION PANEL
ALT: 30 U/L (ref 0–35)
AST: 22 U/L (ref 0–37)
Albumin: 4.8 g/dL (ref 3.5–5.2)
Alkaline Phosphatase: 51 U/L (ref 39–117)
Bilirubin, Direct: 0.1 mg/dL (ref 0.0–0.3)
Total Bilirubin: 0.5 mg/dL (ref 0.2–1.2)
Total Protein: 7.7 g/dL (ref 6.0–8.3)

## 2022-04-23 LAB — TSH: TSH: 0.97 u[IU]/mL (ref 0.35–5.50)

## 2022-04-23 LAB — HEMOGLOBIN A1C: Hgb A1c MFr Bld: 5.8 % (ref 4.6–6.5)

## 2022-04-23 NOTE — Progress Notes (Signed)
Lab results are in range are okay except for the chemistry panel with elevated calcium level potassium I suspect this could be from a hydration status You have had some elevated calciums in the past  arrange BMP and ionized calcium level diagnosis hypercalcemia ( hydrated with limited tourniquet time at your convenience ,do not fast to see if it makes a difference.)

## 2022-04-23 NOTE — Progress Notes (Signed)
Chief Complaint  Patient presents with   Annual Exam    HPI: Patient  Lisa Lee  61 y.o. comes in today for Preventive Health Care visit   Feels that she is doing pretty well on Latuda now and off of the lithium. She has lost some weight on a keto diet but does have some heartburn at times especially nocturnal in the supine position had weaned off with great difficulty of PPI.  We will take occasional Tums Pepcid. Discuss vaccines. On latuda and melatonin.  Recent GYN procedures see notes Health Maintenance  Topic Date Due   Zoster Vaccines- Shingrix (1 of 2) Never done   MAMMOGRAM  05/30/2018   COVID-19 Vaccine (5 - Pfizer risk series) 01/05/2021   PAP SMEAR-Modifier  11/17/2022   COLONOSCOPY (Pts 45-58yrs Insurance coverage will need to be confirmed)  07/05/2023   TETANUS/TDAP  02/23/2030   INFLUENZA VACCINE  Completed   Hepatitis C Screening  Completed   HIV Screening  Completed   HPV VACCINES  Aged Out   Health Maintenance Review LIFESTYLE:  Exercise:  none  Tobacco/ETS: no Alcohol:  1-2 per month  Sugar beverages: none Sleep: 8-9 Drug use: no HH of  2  cat 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   ROS:  REST of 12 system review negative except as per HPI Gyne surgeries no significant respiratory symptoms at present  Past Medical History:  Diagnosis Date   Acid reflux    Asthma    Bipolar disorder ([place])    Depression    Diarrhea 08/09/2011   Poss ibs  Vs other  Related to stress  consdier other eval if needed. Had colonoscoopy per dr Cristina Gong    High cholesterol    History of hypertension    no meds for last year  per pt on 03-08-2022   HPV in female 1984   Hypothyroidism    PMB (postmenopausal bleeding)    Thyroid disease     Past Surgical History:  Procedure Laterality Date   CERVICAL CONIZATION W/BX N/A 07/21/2015   Procedure: CONIZATION CERVIX WITH BIOPSY;  Surgeon: Ena Dawley, MD;  Location: [facility];  Service:  Gynecology;  Laterality: N/A;   CERVICAL CONIZATION W/BX N/A 09/25/2018   Procedure: CONIZATION CERVIX WITH BIOPSY;  Surgeon: Everitt Amber, MD;  Location: [facility];  Service: Gynecology;  Laterality: N/A;   CERVICAL CRYOTHERAPY  07/30/1982   colonscopy  07/04/2018   COLPOSCOPY  08/26/2013   DILATATION & CURETTAGE/HYSTEROSCOPY WITH MYOSURE N/A 03/16/2022   Procedure: ATTEMPTED DILATATION & CURETTAGE/ATTEMPTED HYSTEROSCOPY WITH MYOSURE, POLYPECTOMY;  Surgeon: Paula Compton, MD;  Location: [facility];  Service: Gynecology;  Laterality: N/A;   DILATION AND CURETTAGE, DIAGNOSTIC / THERAPEUTIC  07/30/1992   Blighted Ovum   HYSTEROSCOPY     uterine polypectomy Jan 7th   HYSTEROSCOPY WITH RESECTOSCOPE  08/04/2009   Removed polyp & IUD   LYMPH NODE BIOPSY     40 yrs ago   OPERATIVE ULTRASOUND N/A 03/16/2022   Procedure: OPERATIVE ULTRASOUND;  Surgeon: Paula Compton, MD;  Location: [facility];  Service: Gynecology;  Laterality: N/A;   TONSILLECTOMY     as child   UPPER GI ENDOSCOPY  07/04/2018    Family History  Problem Relation Age of Onset   Heart disease Sister 99       cabg stent   Arthritis Mother    Heart disease Mother    Heart disease Father  Colon cancer Father    Parkinson's disease Father    Pulmonary embolism Daughter    Heart disease Sister    Thyroid disease Sister    Heart disease Brother    Thyroid disease Daughter    Rectal cancer Neg Hx     Social History   Socioeconomic History   Marital status: Married    Spouse name: Not on file   Number of children: Not on file   Years of education: Not on file   Highest education level: Not on file  Occupational History   Not on file  Tobacco Use   Smoking status: Never   Smokeless tobacco: Never  Vaping Use   Vaping Use: Never used  Substance and Sexual Activity   Alcohol use: Yes    Alcohol/week: 0.0 standard drinks of alcohol    Comment: monthly  ocassional drink   Drug use: No   Sexual activity: Yes    Birth control/protection: None  Other Topics Concern   Not on file  Social History Narrative    [employer]. 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 1 each 1   ALPRAZolam (XANAX) 0.5 MG tablet Take 1/2 to 1 tablet (0.25-0.5 mg total) by mouth 2 times daily as needed for anxiety. 30 tablet 1   aspirin EC 81 MG tablet Take 1 tablet (81 mg total) by mouth daily.     atorvastatin (LIPITOR) 10 MG tablet Take 1 tablet (10 mg total) by mouth daily. (Patient taking differently: Take 10 mg by mouth at bedtime.) 90 tablet 3   Cholecalciferol (VITAMIN D) 2000 units tablet Take 2,000 Units by mouth in the morning and at bedtime.     doxylamine, Sleep, (UNISOM) 25 MG tablet Take 25 mg by mouth at bedtime.     escitalopram (LEXAPRO) 20 MG tablet Take 1 tablet (20 mg total) by mouth daily. 90 tablet 3   ezetimibe (ZETIA) 10 MG tablet Take 1 tablet (10 mg total) by mouth daily. (Patient taking differently: Take 10 mg by mouth at bedtime.) 90 tablet 3   famotidine (PEPCID) 20 MG tablet TAKE 1 TABLET AT BEDTIME (Patient taking differently: Twice a day) 90 tablet 3   fenofibrate 160 MG tablet Take 1 tablet (160 mg total) by mouth daily. (Patient taking differently: Take 160 mg by mouth at bedtime.) 90 tablet 2   levothyroxine (SYNTHROID) 75 MCG tablet Take 1 tablet (75  mcg total) by mouth daily before breakfast. 90 tablet 3   loperamide (IMODIUM A-D) 2 MG tablet Take 2 mg by mouth as needed.     lurasidone (LATUDA) 20 MG TABS tablet TAKE 1 TABLET DAILY WITH   SUPPER 90 tablet 3   Melatonin 3 MG CAPS Take 10 mg by mouth at bedtime.     Multiple  Vitamins-Minerals (MULTIVITAMIN WITH MINERALS) tablet Take 1 tablet by mouth daily.     valACYclovir (VALTREX) 500 MG tablet TAKE 1 TABLET DAILY FOR    SUPPRESSION AND 1 TABLET   TWO TIMES A DAY FOR 3 DAYS FOR OUTBREAK. 90 tablet 0   modafinil (PROVIGIL) 200 MG tablet Take 0.5 tablets (100 mg total) by mouth daily. 30 tablet 1   No facility-administered medications prior to visit.     EXAM:  BP 122/72 (BP Location: Right Arm, Patient Position: Sitting, Cuff Size: Normal)   Pulse 60   Temp 98.1 F (36.7 C) (Oral)   Ht 5' 0.98" (1.549 m)   Wt 131 lb 6.4 oz (59.6 kg)   LMP 12/09/2013   SpO2 96%   BMI 24.84 kg/m   Body mass index is 24.84 kg/m. Wt Readings from Last 3 Encounters:  04/23/22 131 lb 6.4 oz (59.6 kg)  03/16/22 134 lb (60.8 kg)  07/10/21 134 lb 12.8 oz (61.1 kg)    Physical Exam: Vital signs reviewed GYK:ZLDJ is a well-developed well-nourished alert cooperative    who appearsr stated age in no acute distress.  HEENT: normocephalic atraumatic , Eyes: PERRL EOM's full, conjunctiva clear, Nares: paten,t no deformity discharge or tenderness., Ears: no deformity EAC's clear TMs with normal landmarks. Mouth: clear OP, no lesions, edema.  Moist mucous membranes. Dentition in adequate repair. NECK: supple without masses, thyromegaly or bruits. CHEST/PULM:  Clear to auscultation and percussion breath sounds equal no wheeze , rales or rhonchi. No chest wall deformities or tenderness. Breast: normal by inspection . No dimpling, discharge, masses, tenderness or discharge . CV: PMI is nondisplaced, S1 S2 no gallops, murmurs, rubs. Peripheral pulses are full without delay.No JVD .  ABDOMEN: Bowel sounds  normal nontender  No guard or rebound, no hepato splenomegal no CVA tenderness.  No hernia. Extremtities:  No clubbing cyanosis or edema, no acute joint swelling or redness no focal atrophy NEURO:  Oriented x3, cranial nerves 3-12 appear to be intact, no obvious focal weakness,gait within normal limits no abnormal reflexes or asymmetrical SKIN: No acute rashes normal turgor, color, no bruising or petechiae. PSYCH: Oriented, good eye contact, no obvious depression anxiety, cognition and judgment appear normal. LN: no cervical axillary inguinal adenopathy  Lab Results  Component Value Date   WBC 7.1 04/23/2022   HGB 14.4 04/23/2022   HCT 42.7 04/23/2022   PLT 291.0 04/23/2022   GLUCOSE 97 04/23/2022   CHOL 151 04/23/2022   TRIG 99.0 04/23/2022   HDL 61.10 04/23/2022   LDLDIRECT 101.0 10/30/2016   LDLCALC 70 04/23/2022   ALT 30 04/23/2022   AST 22 04/23/2022   NA 146 (H) 04/23/2022   K 5.3 (H) 04/23/2022   CL 108 04/23/2022   CREATININE 1.12 04/23/2022   BUN 30 (H) 04/23/2022   CO2 26 04/23/2022   TSH 0.97 04/23/2022   HGBA1C 5.8 04/23/2022    BP Readings from Last 3 Encounters:  04/23/22 122/72  03/16/22 137/72  07/20/21 109/66    Lab plan reviewed with patient   ASSESSMENT AND PLAN:  Discussed the following assessment and plan:    ICD-10-CM   1. Visit for preventive health examination  Z00.00 Basic metabolic panel    CBC with Differential/Platelet    Hemoglobin A1c    Hepatic function panel    Lipid panel    TSH    TSH    Lipid panel    Hepatic function panel    Hemoglobin A1c  CBC with Differential/Platelet    Basic metabolic panel    2. Hypothyroidism, unspecified type  E03.9 DG Bone Density    Basic metabolic panel    CBC with Differential/Platelet    Hemoglobin A1c    Hepatic function panel    Lipid panel    TSH    TSH    Lipid panel    Hepatic function panel    Hemoglobin A1c    CBC with Differential/Platelet    Basic metabolic panel    3.  Hyperlipidemia, unspecified hyperlipidemia type  E78.5 Basic metabolic panel    CBC with Differential/Platelet    Hemoglobin A1c    Hepatic function panel    Lipid panel    TSH    TSH    Lipid panel    Hepatic function panel    Hemoglobin A1c    CBC with Differential/Platelet    Basic metabolic panel    4. Medication management  Z79.899 Basic metabolic panel    CBC with Differential/Platelet    Hemoglobin A1c    Hepatic function panel    Lipid panel    TSH    TSH    Lipid panel    Hepatic function panel    Hemoglobin A1c    CBC with Differential/Platelet    Basic metabolic panel    5. Fasting hyperglycemia  R73.01 Basic metabolic panel    CBC with Differential/Platelet    Hemoglobin A1c    Hepatic function panel    Lipid panel    TSH    TSH    Lipid panel    Hepatic function panel    Hemoglobin A1c    CBC with Differential/Platelet    Basic metabolic panel    6. Gastro-esophageal reflux disease without esophagitis  K21.9 Basic metabolic panel    CBC with Differential/Platelet    Hemoglobin A1c    Hepatic function panel    Lipid panel    TSH    TSH    Lipid panel    Hepatic function panel    Hemoglobin A1c    CBC with Differential/Platelet    Basic metabolic panel    7. Estrogen deficiency  E28.39 DG Bone Density    Basic metabolic panel    CBC with Differential/Platelet    Hemoglobin A1c    Hepatic function panel    Lipid panel    TSH    TSH    Lipid panel    Hepatic function panel    Hemoglobin A1c    CBC with Differential/Platelet    Basic metabolic panel    8. Influenza vaccine needed  Z23 Flu Vaccine QUAD 6+ mos PF IM (Fluarix Quad PF)    Discussed vaccines GI symptoms head of bed elevated Pepcid at night I agree with trying to stay off of the PPI unless compelling reason. Her GI team includes Dr. Orvan FalconerBeavers. Persistent progressive symptoms can follow-up. Discussed bone density that we can order and she make appointment when convenient.  She  does have risk. Return in about 1 year (around 04/24/2023) for depending on results.  Patient Care Team: Soma Bachand, Neta MendsWanda K, MD as PCP - Veto KempsGeneral Hollander, Edward, MD (Ophthalmology) Bernette RedbirdBuccini, Robert, MD as Consulting Physician (Gastroenterology) Tamela OddiHurst,Teresa Nelson, Faustino CongressKatarina H, MD as Consulting Physician (Cardiology) Huel Coteichardson, Kathy, MD as Consulting Physician (Obstetrics and Gynecology) Patient Instructions  Good to see you today . Glad you are doing well.  Lab monitoring.  Get RSV   in October to cover the season ( lasts  about 5-6 months)  Shingrix when you wish.   Flu vaccine today . Get appt for dexa scan  when convenient  Please call [phone] to schedule a Bone Density (DexaScan) a the [place] office at [address].    Try HOB elevated for nocturnal reflux  Avoid  eating 3 hours before reclining  pepcid ok .   Neta Mends. Neal Oshea M.D.

## 2022-04-23 NOTE — Patient Instructions (Addendum)
Good to see you today . Glad you are doing well.  Lab monitoring.  Get RSV   in October to cover the season ( lasts about 5-6 months)  Shingrix when you wish.   Flu vaccine today . Get appt for dexa scan  when convenient  Please call [phone] to schedule a Bone Density (DexaScan) a the [place] office at [place].    Try HOB elevated for nocturnal reflux  Avoid  eating 3 hours before reclining  pepcid ok .

## 2022-05-03 ENCOUNTER — Other Ambulatory Visit: Payer: Self-pay

## 2022-05-07 ENCOUNTER — Other Ambulatory Visit: Payer: Self-pay | Admitting: *Deleted

## 2022-05-07 DIAGNOSIS — E785 Hyperlipidemia, unspecified: Secondary | ICD-10-CM

## 2022-05-07 MED ORDER — FENOFIBRATE 160 MG PO TABS: 160.0000 mg | ORAL_TABLET | Freq: Every day | ORAL | 0 refills | Status: DC

## 2022-05-08 ENCOUNTER — Other Ambulatory Visit (INDEPENDENT_AMBULATORY_CARE_PROVIDER_SITE_OTHER): Payer: BC Managed Care – PPO

## 2022-05-08 LAB — BASIC METABOLIC PANEL
BUN: 28 mg/dL — ABNORMAL HIGH (ref 6–23)
CO2: 24 mEq/L (ref 19–32)
Calcium: 10.6 mg/dL — ABNORMAL HIGH (ref 8.4–10.5)
Chloride: 106 mEq/L (ref 96–112)
Creatinine, Ser: 0.9 mg/dL (ref 0.40–1.20)
GFR: 68.51 mL/min (ref >60.00)
Glucose, Bld: 100 mg/dL — ABNORMAL HIGH (ref 70–99)
Potassium: 4.4 mEq/L (ref 3.5–5.1)
Sodium: 139 mEq/L (ref 135–145)

## 2022-05-13 NOTE — Progress Notes (Signed)
Potassium and sodium  better , now normal  Calcium still borderline high  for  while  off and on over years  (  pth has been ok in past.) Sometimes meds can do this but dont see obvious  cause on  your med list   Order ionized calcium and iPTH calcium in another month . If needed  we can get opinion from endocrinology as to  the mildly  elevated calcium

## 2022-06-03 ENCOUNTER — Other Ambulatory Visit: Payer: Self-pay | Admitting: Internal Medicine

## 2022-06-07 ENCOUNTER — Telehealth: Payer: Self-pay | Admitting: Physician Assistant

## 2022-06-07 ENCOUNTER — Telehealth: Payer: Self-pay

## 2022-06-07 NOTE — Telephone Encounter (Signed)
Pt LVM stating she has ask for PA for generic Latuda. Been waiting on PA since July. Have been paying out of pocket. [facility] sent request several times. Contact pt @ [phone]

## 2022-06-07 NOTE — Telephone Encounter (Signed)
Please see message in regards to [place] PA.   Pt's approval received 05/25/2021 for LATUDA 20 MG effective 05/25/2021-05/24/2022 with RX Benefits. ID# [id number]

## 2022-06-07 NOTE — Telephone Encounter (Signed)
Pharmacy initiated a PA for BRAND Latuda 20 mg #90/90 day. Not sure if she needed Brand or why that was started. I can submit for both if need be. Not sure about her waiting on a PA since July?

## 2022-06-08 ENCOUNTER — Inpatient Hospital Stay: Admission: RE | Admit: 2022-06-08 | Payer: 59 | Source: Ambulatory Visit

## 2022-06-08 NOTE — Telephone Encounter (Signed)
Prior Approval received for Lurasidone 20 mg #90/90 effective 06/08/2022-06/08/2025 with [facility]

## 2022-06-08 NOTE — Telephone Encounter (Signed)
Canceled PA initiated for Lisa Lee from her pharmacy, changed to generic per okay from [place], Lurasidone 20 mg #90/90 day New PA sent to [facility], pending response.

## 2022-06-08 NOTE — Telephone Encounter (Signed)
Patient notified

## 2022-06-08 NOTE — Telephone Encounter (Signed)
See other phone note. PA approved for Lurasidone

## 2022-06-13 ENCOUNTER — Encounter: Payer: Self-pay | Admitting: Internal Medicine

## 2022-06-26 ENCOUNTER — Other Ambulatory Visit: Payer: Self-pay

## 2022-06-26 DIAGNOSIS — E785 Hyperlipidemia, unspecified: Secondary | ICD-10-CM

## 2022-06-26 MED ORDER — EZETIMIBE 10 MG PO TABS: 10.0000 mg | ORAL_TABLET | Freq: Every day | ORAL | 0 refills | Status: DC

## 2022-06-26 MED ORDER — FENOFIBRATE 160 MG PO TABS: 160.0000 mg | ORAL_TABLET | Freq: Every day | ORAL | 0 refills | Status: DC

## 2022-06-26 NOTE — Addendum Note (Signed)
Addended by: Margaret Pyle D on: 06/26/2022 03:04 PM   Modules accepted: Orders

## 2022-07-25 ENCOUNTER — Other Ambulatory Visit: Payer: Self-pay

## 2022-07-25 MED ORDER — EZETIMIBE 10 MG PO TABS: 10.0000 mg | ORAL_TABLET | Freq: Every day | ORAL | 0 refills | Status: DC

## 2022-07-26 ENCOUNTER — Ambulatory Visit (INDEPENDENT_AMBULATORY_CARE_PROVIDER_SITE_OTHER)
Admission: RE | Admit: 2022-07-26 | Discharge: 2022-07-26 | Disposition: A | Discharge status: Home / Self Care | Payer: BC Managed Care – PPO | Source: Ambulatory Visit | Attending: Internal Medicine | Admitting: Internal Medicine

## 2022-07-26 DIAGNOSIS — E039 Hypothyroidism, unspecified: Secondary | ICD-10-CM

## 2022-07-26 DIAGNOSIS — E2839 Other primary ovarian failure: Secondary | ICD-10-CM | POA: Diagnosis not present

## 2022-08-09 ENCOUNTER — Encounter: Payer: Self-pay | Admitting: Internal Medicine

## 2022-08-10 ENCOUNTER — Other Ambulatory Visit: Payer: Self-pay | Admitting: Family

## 2022-08-10 MED ORDER — ALENDRONATE SODIUM 70 MG PO TABS: 70.0000 mg | ORAL_TABLET | ORAL | 11 refills | Status: DC

## 2022-08-13 ENCOUNTER — Ambulatory Visit: Payer: BC Managed Care – PPO | Admitting: Physician Assistant

## 2022-08-20 NOTE — Progress Notes (Signed)
Cardiology Office Note:    Date:  08/28/2022   ID:  Lisa Lee, DOB June 29, 1961, MRN [id number]  PCP:  Madelin Headings, MD   [facility] Providers Cardiologist:  None     Referring MD: Madelin Headings, MD    History of Present Illness:    Lisa Lee is a 62 y.o. female with a hx of HLD, depression, asthma, and significant family history of premature CAD with Ca score 0 who was previously followed by Dr. Delton See who now presents to clinic for follow-up.   Last seen in clinic 06/2021 where the patient was complaining of intermittent chest discomfort with hard exercise. Coronary CTA 06/2021 with mild coronary disease. Ca score 42.  Today, the patient overall feels well. No chest pain, SOB, orthopnea or PND. Her husband was recently diagnosed with Afib and HF and has been struggling with significant symptoms. Blood pressure is well controlled. LDL 70.   Family history: Sister with MI in 26s s/p PCI and CABG, brother with CABG in 58s.   Past Medical History:  Diagnosis Date   Acid reflux    Asthma    Bipolar disorder (HCC)    Depression    Diarrhea 08/09/2011   Poss ibs  Vs other  Related to stress  consdier other eval if needed. Had colonoscoopy per dr Matthias Hughs    High cholesterol    History of hypertension    no meds for last year  per pt on 03-08-2022   HPV in female 1984   Hypothyroidism    PMB (postmenopausal bleeding)    Thyroid disease     Past Surgical History:  Procedure Laterality Date   CERVICAL CONIZATION W/BX N/A 07/21/2015   Procedure: CONIZATION CERVIX WITH BIOPSY;  Surgeon: Kirkland Hun, MD;  Location: [facility];  Service: Gynecology;  Laterality: N/A;   CERVICAL CONIZATION W/BX N/A 09/25/2018   Procedure: CONIZATION CERVIX WITH BIOPSY;  Surgeon: Adolphus Birchwood, MD;  Location: [facility];  Service: Gynecology;  Laterality: N/A;   CERVICAL CRYOTHERAPY  07/30/1982   colonscopy  07/04/2018   COLPOSCOPY  08/26/2013    DILATATION & CURETTAGE/HYSTEROSCOPY WITH MYOSURE N/A 03/16/2022   Procedure: ATTEMPTED DILATATION & CURETTAGE/ATTEMPTED HYSTEROSCOPY WITH MYOSURE, POLYPECTOMY;  Surgeon: Huel Cote, MD;  Location: [facility];  Service: Gynecology;  Laterality: N/A;   DILATION AND CURETTAGE, DIAGNOSTIC / THERAPEUTIC  07/30/1992   Blighted Ovum   HYSTEROSCOPY     uterine polypectomy Jan 7th   HYSTEROSCOPY WITH RESECTOSCOPE  08/04/2009   Removed polyp & IUD   LYMPH NODE BIOPSY     40 yrs ago   OPERATIVE ULTRASOUND N/A 03/16/2022   Procedure: OPERATIVE ULTRASOUND;  Surgeon: Huel Cote, MD;  Location: [facility];  Service: Gynecology;  Laterality: N/A;   TONSILLECTOMY     as child   UPPER GI ENDOSCOPY  07/04/2018    Current Medications: Current Meds  Medication Sig   albuterol (PROAIR HFA) 108 (90 Base) MCG/ACT inhaler Inhale 2 puffs into the lungs every 6 (six) hours as needed for wheezing.   alendronate (FOSAMAX) 70 MG tablet Take 1 tablet (70 mg total) by mouth every 7 (seven) days. Take with a full glass of water on an empty stomach.   ALPRAZolam (XANAX) 0.5 MG tablet Take 1/2 to 1 tablet (0.25-0.5 mg total) by mouth 2 times daily as needed for anxiety.   aspirin EC 81 MG tablet Take 1 tablet (81 mg total) by mouth daily.  atorvastatin (LIPITOR) 10 MG tablet Take 1 tablet (10 mg total) by mouth daily. (Patient taking differently: Take 10 mg by mouth at bedtime.)   Cholecalciferol (VITAMIN D) 2000 units tablet Take 2,000 Units by mouth in the morning and at bedtime.   doxylamine, Sleep, (UNISOM) 25 MG tablet Take 25 mg by mouth at bedtime.   escitalopram (LEXAPRO) 20 MG tablet Take 1 tablet (20 mg total) by mouth daily.   ezetimibe (ZETIA) 10 MG tablet Take 1 tablet (10 mg total) by mouth daily.   famotidine (PEPCID) 20 MG tablet TAKE 1 TABLET AT BEDTIME (Patient taking differently: Twice a day)   fenofibrate 160 MG tablet Take 1 tablet (160 mg total) by mouth  at bedtime.   levothyroxine (SYNTHROID) 75 MCG tablet TAKE 1 TABLET BY MOUTH EVERY DAY BEFORE BREAKFAST   loperamide (IMODIUM A-D) 2 MG tablet Take 2 mg by mouth as needed.   lurasidone (LATUDA) 20 MG TABS tablet TAKE 1 TABLET DAILY WITH   SUPPER   Multiple Vitamins-Minerals (MULTIVITAMIN WITH MINERALS) tablet Take 1 tablet by mouth daily.   valACYclovir (VALTREX) 500 MG tablet TAKE 1 TABLET DAILY FOR    SUPPRESSION AND 1 TABLET   TWO TIMES A DAY FOR 3 DAYS FOR OUTBREAK     Allergies:   Penicillins, Pravastatin, and Rosuvastatin   Social History   Socioeconomic History   Marital status: Married    Spouse name: Not on file   Number of children: Not on file   Years of education: Not on file   Highest education level: Not on file  Occupational History   Not on file  Tobacco Use   Smoking status: Never   Smokeless tobacco: Never  Vaping Use   Vaping Use: Never used  Substance and Sexual Activity   Alcohol use: Yes    Alcohol/week: 0.0 standard drinks of alcohol    Comment: monthly ocassional drink   Drug use: No   Sexual activity: Yes    Birth control/protection: None  Other Topics Concern   Not on file  Social History Narrative    [employer]. 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    Divorced   Regular exercise-  Not as much recently    [facility] of [facility]   Pets 2 cats 1 dog to move   Daughter  On recovery heroin   Social Determinants of Health   Financial Resource Strain: Not on file  Food Insecurity: Not on file  Transportation Needs: Not on file  Physical Activity: Not on file  Stress: Not on file  Social Connections: Not on file     Family History: The patient's family history includes Arthritis in her mother; Colon cancer in her father; Heart disease in her brother, father, mother, and sister; Heart disease (age of onset: 34) in her sister; Parkinson's disease in her father; Pulmonary embolism in her daughter; Thyroid disease in her  daughter and sister. There is no history of Rectal cancer.  ROS:   Please see the history of present illness.    Review of Systems  Constitutional:  Negative for chills and fever.  Respiratory:  Negative for shortness of breath.   Cardiovascular:  Negative for chest pain, palpitations, orthopnea, claudication, leg swelling and PND.  Gastrointestinal:  Negative for nausea and vomiting.  Musculoskeletal:  Negative for falls.  Neurological:  Negative for dizziness and loss of consciousness.     EKGs/Labs/Other Studies Reviewed:    The following  studies were reviewed today: Coronary CTA 06/2021: FINDINGS: A 100 kV prospective scan was triggered in the descending thoracic aorta at 111 HU's. Axial non-contrast 3 mm slices were carried out through the heart. The data set was analyzed on a dedicated work station and scored using the [place]. Gantry rotation speed was 250 msecs and collimation was .6 mm. 0.8 mg of sl NTG was given. The 3D data set was reconstructed in 5% intervals of the 35-75 % of the R-R cycle. Phases were analyzed on a dedicated work station using MPR, MIP and VRT modes. The patient received 100 cc of contrast.   Coronary Arteries:  Normal coronary origin.  Right dominance.   RCA is a large dominant artery that gives rise to PDA and PLA. There is no plaque.   Left main is a large artery that gives rise to LAD and LCX arteries.   LAD is a large vessel. There is calcified plaque in distal left main/proximal LAD causing 0-24% stenosis   LCX is a non-dominant artery. There is calcified plaque in the mid LCX causing 0-24% stenosis   Other findings:   Left Ventricle: Normal size   Left Atrium: Mild enlargement   Pulmonary Veins: Normal configuration   Right Ventricle: Mild enlargement   Right Atrium: Normal size   Cardiac valves: No calcifications   Thoracic aorta: Normal size   Pulmonary Arteries: Normal size   Systemic Veins: Normal drainage    Pericardium: Normal thickness   IMPRESSION: 1. Coronary calcium score of 42. This was 80th percentile for age and sex matched control.   2. Normal coronary origin with right dominance.   3. Nonobstructive CAD   4. There is calcified plaque in distal left main/proximal LAD causing minimal (0-24%) stenosis   5. There is calcified plaque in the mid LCX causing minimal (0-24%) stenosis   CAD-RADS 1. Minimal non-obstructive CAD (0-24%). Consider non-atherosclerotic causes of chest pain. Consider preventive therapy and risk factor modification.  CTA 2017: FINDINGS: Cardiovascular: Normal heart size. No significant pericardial fluid/thickening. Left main and left anterior descending coronary atherosclerosis. Atherosclerotic nonaneurysmal thoracic aorta. Normal caliber pulmonary arteries.   Mediastinum/Nodes: No discrete thyroid nodules. Unremarkable esophagus. No pathologically enlarged axillary, mediastinal or gross hilar lymph nodes, noting limited sensitivity for the detection of hilar adenopathy on this noncontrast study.   Lungs/Pleura: No pneumothorax. No pleural effusion. Medial right upper lobe 2 mm solid pulmonary nodule (series 5/ image 62) is stable since 05/11/2014 and considered benign. Subpleural lateral segment right middle lobe 4 mm pulmonary nodule (series 5/ image 83) is stable since 05/11/2014 and considered benign. No acute consolidative airspace disease, lung masses or new significant pulmonary nodules. No significant regions of subpleural reticulation, ground-glass attenuation, traction bronchiectasis, parenchymal banding, architectural distortion or frank honeycombing. Mild patchy air trapping in both lungs on the expiration sequence.   Upper abdomen: Unremarkable.   Musculoskeletal: No aggressive appearing focal osseous lesions. Moderate thoracic spondylosis.   IMPRESSION: 1. No evidence of interstitial lung disease. No acute pulmonary disease. 2.  Mild patchy air trapping in both lungs, compatible with small airways disease. 3. Aortic atherosclerosis. Left main and 1 vessel coronary atherosclerosis.  EKG:  NSR with HR 68. Personally reviewed.  Recent Labs: 04/23/2022: ALT 30; Hemoglobin 14.4; Platelets 291.0; TSH 0.97 05/08/2022: BUN 28; Creatinine, Ser 0.90; Potassium 4.4; Sodium 139  Recent Lipid Panel    Component Value Date/Time   CHOL 151 04/23/2022 1056   CHOL 167 06/03/2019 0840   TRIG 99.0 04/23/2022  1056   HDL 61.10 04/23/2022 1056   HDL 53 06/03/2019 0840   CHOLHDL 2 04/23/2022 1056   VLDL 19.8 04/23/2022 1056   LDLCALC 70 04/23/2022 1056   LDLCALC 95 03/29/2020 1443   LDLDIRECT 101.0 10/30/2016 1051          Physical Exam:    VS:  BP 120/80   Pulse 68   Ht 5' (1.524 m)   Wt 137 lb 3.2 oz (62.2 kg)   LMP 12/09/2013   SpO2 96%   BMI 26.80 kg/m     Wt Readings from Last 3 Encounters:  08/28/22 137 lb 3.2 oz (62.2 kg)  04/23/22 131 lb 6.4 oz (59.6 kg)  03/16/22 134 lb (60.8 kg)     GEN:  Comfortable, NAD HEENT: Normal NECK: No JVD; No carotid bruits CARDIAC: RRR, no murmurs, rubs, gallops RESPIRATORY:  Clear to auscultation without rales, wheezing or rhonchi  ABDOMEN: Soft, non-tender, non-distended MUSCULOSKELETAL:  No edema; No deformity  SKIN: Warm and dry NEUROLOGIC:  Alert and oriented x 3 PSYCHIATRIC:  Normal affect   ASSESSMENT:    1. Precordial pain   2. Hyperlipidemia, unspecified hyperlipidemia type    PLAN:    In order of problems listed above:  #Chest Pain: #Nonobstructive CAD: Reassuring work-up. Coronary CTA 06/2021 with mild, nonobstructive disease. Ca score 42.  -Continue lipitor 10mg  daily; intolerant of higher doses/alternative statins -Continue fenofibrate 160mg  daily -Continue zetia 10mg  daily -Continue ASA 81mg  daily  #HLD: Ca score 42 with mild, nonobstructive CAD. LDL 70 in 03/2022 -Continue lipitor 10mg  daily; intolerant of higher doses/alternative  statins -Continue fenofibrate 160mg  daily -Continue zetia 10mg  daily    Medication Adjustments/Labs and Tests Ordered: Current medicines are reviewed at length with the patient today.  Concerns regarding medicines are outlined above.  No orders of the defined types were placed in this encounter.  No orders of the defined types were placed in this encounter.   There are no Patient Instructions on file for this visit.   Signed, Freada Bergeron, MD  08/28/2022 4:33 PM    [place]

## 2022-08-28 ENCOUNTER — Encounter: Payer: Self-pay | Admitting: Cardiology

## 2022-08-28 ENCOUNTER — Ambulatory Visit: Payer: BC Managed Care – PPO | Attending: Cardiology | Admitting: Cardiology

## 2022-08-28 VITALS — BP 120/80 | HR 68 | Ht 60.0 in | Wt 137.2 lb

## 2022-08-28 DIAGNOSIS — R072 Precordial pain: Secondary | ICD-10-CM

## 2022-08-28 DIAGNOSIS — E785 Hyperlipidemia, unspecified: Secondary | ICD-10-CM | POA: Diagnosis not present

## 2022-08-28 MED ORDER — EZETIMIBE 10 MG PO TABS: 10.0000 mg | ORAL_TABLET | Freq: Every day | ORAL | 4 refills | Status: DC

## 2022-08-28 MED ORDER — ATORVASTATIN CALCIUM 10 MG PO TABS: 10.0000 mg | ORAL_TABLET | Freq: Every day | ORAL | 4 refills | Status: DC

## 2022-08-28 MED ORDER — FENOFIBRATE 160 MG PO TABS: 160.0000 mg | ORAL_TABLET | Freq: Every day | ORAL | 4 refills | Status: DC

## 2022-08-28 NOTE — Patient Instructions (Signed)
Medication Instructions:   Your physician recommends that you continue on your current medications as directed. Please refer to the Current Medication list given to you today.  *If you need a refill on your cardiac medications before your next appointment, please call your pharmacy*    Follow-Up: At [facility], you and your health needs are our priority.  As part of our continuing mission to provide you with exceptional heart care, we have created designated Provider Care Teams.  These Care Teams include your primary Cardiologist (physician) and Advanced Practice Providers (APPs -  Physician Assistants and Nurse Practitioners) who all work together to provide you with the care you need, when you need it.  We recommend signing up for the patient portal called "MyChart".  Sign up information is provided on this After Visit Summary.  MyChart is used to connect with patients for Virtual Visits (Telemedicine).  Patients are able to view lab/test results, encounter notes, upcoming appointments, etc.  Non-urgent messages can be sent to your provider as well.   To learn more about what you can do with MyChart, go to [email or web address].    Your next appointment:   1 year(s)  Provider:   Dr. Pemberton   

## 2022-09-02 ENCOUNTER — Other Ambulatory Visit: Payer: Self-pay | Admitting: Physician Assistant

## 2022-09-02 ENCOUNTER — Other Ambulatory Visit: Payer: Self-pay | Admitting: Internal Medicine

## 2022-09-03 ENCOUNTER — Other Ambulatory Visit: Payer: Self-pay | Admitting: Family

## 2022-09-03 MED ORDER — VALACYCLOVIR HCL 500 MG PO TABS: ORAL_TABLET | ORAL | 0 refills | Status: DC

## 2022-09-03 NOTE — Telephone Encounter (Signed)
Please call to schedule an appt. Was due in January and canceled F.U.

## 2022-09-04 NOTE — Telephone Encounter (Signed)
Apt 3/25

## 2022-09-11 ENCOUNTER — Encounter: Payer: Self-pay | Admitting: Internal Medicine

## 2022-09-11 NOTE — Telephone Encounter (Signed)
If I knew what it was I will fill  and if  persistent or progressive then appt   Is it hydrocodone? Cough med?  Or tessalon perles ?

## 2022-09-11 NOTE — Telephone Encounter (Signed)
[place] for refill or does pt needs appt?

## 2022-09-12 MED ORDER — HYDROCODONE BIT-HOMATROP MBR 5-1.5 MG/5ML PO SOLN: 5.0000 mL | Freq: Three times a day (TID) | ORAL | 0 refills | Status: DC | PRN

## 2022-09-12 NOTE — Telephone Encounter (Signed)
I will send in hycodan to your pharmacy    friendly Visit if  persistent or progressive concerns

## 2022-09-12 NOTE — Telephone Encounter (Signed)
Pt is referring to the [facility]

## 2022-09-30 ENCOUNTER — Encounter: Payer: Self-pay | Admitting: Internal Medicine

## 2022-10-19 ENCOUNTER — Other Ambulatory Visit: Payer: Self-pay | Admitting: Family

## 2022-10-19 MED ORDER — ALENDRONATE SODIUM 70 MG PO TABS: 70.0000 mg | ORAL_TABLET | ORAL | 11 refills | Status: DC

## 2022-10-22 ENCOUNTER — Ambulatory Visit: Payer: BC Managed Care – PPO | Admitting: Physician Assistant

## 2022-10-22 ENCOUNTER — Encounter: Payer: Self-pay | Admitting: Physician Assistant

## 2022-10-22 DIAGNOSIS — G4700 Insomnia, unspecified: Secondary | ICD-10-CM | POA: Diagnosis not present

## 2022-10-22 DIAGNOSIS — F411 Generalized anxiety disorder: Secondary | ICD-10-CM

## 2022-10-22 DIAGNOSIS — F319 Bipolar disorder, unspecified: Secondary | ICD-10-CM

## 2022-10-22 MED ORDER — ALPRAZOLAM 0.5 MG PO TABS: 0.2500 mg | ORAL_TABLET | Freq: Two times a day (BID) | ORAL | 0 refills | Status: DC | PRN

## 2022-10-22 MED ORDER — ESCITALOPRAM OXALATE 20 MG PO TABS: 20.0000 mg | ORAL_TABLET | Freq: Every day | ORAL | 1 refills | Status: DC

## 2022-10-22 NOTE — Progress Notes (Signed)
[facility] Med Check  Patient ID: Lisa Lee,  MRN: [id number]  PCP: Burnis Medin, MD  Date of Evaluation: 10/22/2022 Time spent:20 minutes  Chief Complaint:  Chief Complaint   Anxiety; Depression; Follow-up     HISTORY/CURRENT STATUS: For routine med check.  She is doing well. Patient is able to enjoy things.  Energy and motivation are good.  Work is going well. Still teaches nursing/health at a HS.   No extreme sadness, tearfulness, or feelings of hopelessness.  Sleeps well most of the time. ADLs and personal hygiene are normal.   Denies any changes in concentration, making decisions, or remembering things.  Appetite has not changed.  Weight is stable.  She occasionally has anxiety.  She does take the Xanax on rare occasions.  She still has some left from her prescription filled last April.  Denies suicidal or homicidal thoughts.  Patient denies increased energy with decreased need for sleep, increased talkativeness, racing thoughts, impulsivity or risky behaviors, increased spending, increased libido, grandiosity, increased irritability or anger, paranoia, or hallucinations.  Denies dizziness, syncope, seizures, numbness, tingling, tremor, tics, unsteady gait, slurred speech, confusion. Denies muscle or joint pain, stiffness, or dystonia. Denies unexplained weight loss, frequent infections, or sores that heal slowly.  No polyphagia, polydipsia, or polyuria. Denies visual changes or paresthesias.   Individual Medical History/ Review of Systems: Changes? :No     Past medications for mental health diagnoses include: Lexapro, lithium, Xanax, Modafanil  Allergies: Penicillins, Pravastatin, and Rosuvastatin  Current Medications:  Current Outpatient Medications:    alendronate (FOSAMAX) 70 MG tablet, Take 1 tablet (70 mg total) by mouth every 7 (seven) days. Take with a full glass of water on an empty stomach., Disp: 4 tablet, Rfl: 11   aspirin EC 81 MG tablet, Take 1  tablet (81 mg total) by mouth daily., Disp: , Rfl:    atorvastatin (LIPITOR) 10 MG tablet, Take 1 tablet (10 mg total) by mouth at bedtime., Disp: 90 tablet, Rfl: 4   calcium carbonate (OSCAL) 1500 (600 Ca) MG TABS tablet, Take by mouth 2 (two) times daily with a meal., Disp: , Rfl:    Cholecalciferol (VITAMIN D) 2000 units tablet, Take 2,000 Units by mouth in the morning and at bedtime., Disp: , Rfl:    doxylamine, Sleep, (UNISOM) 25 MG tablet, Take 25 mg by mouth at bedtime., Disp: , Rfl:    ezetimibe (ZETIA) 10 MG tablet, Take 1 tablet (10 mg total) by mouth daily., Disp: 90 tablet, Rfl: 4   famotidine (PEPCID) 20 MG tablet, TAKE 1 TABLET AT BEDTIME (Patient taking differently: Twice a day), Disp: 90 tablet, Rfl: 3   fenofibrate 160 MG tablet, Take 1 tablet (160 mg total) by mouth at bedtime., Disp: 60 tablet, Rfl: 4   levothyroxine (SYNTHROID) 75 MCG tablet, TAKE 1 TABLET BY MOUTH EVERY DAY BEFORE BREAKFAST, Disp: 90 tablet, Rfl: 3   loperamide (IMODIUM A-D) 2 MG tablet, Take 2 mg by mouth as needed., Disp: , Rfl:    lurasidone (LATUDA) 20 MG TABS tablet, TAKE 1 TABLET DAILY WITH   SUPPER, Disp: 90 tablet, Rfl: 3   Multiple Vitamins-Minerals (MULTIVITAMIN WITH MINERALS) tablet, Take 1 tablet by mouth daily., Disp: , Rfl:    valACYclovir (VALTREX) 500 MG tablet, TAKE 1 TABLET DAILY FOR    SUPPRESSION AND 1 TABLET   TWO TIMES A DAY FOR 3 DAYS FOR OUTBREAK Strength: 500 mg, Disp: 90 tablet, Rfl: 0   albuterol (PROAIR HFA) 108 (90 Base) MCG/ACT  inhaler, Inhale 2 puffs into the lungs every 6 (six) hours as needed for wheezing. (Patient not taking: Reported on 10/22/2022), Disp: 1 each, Rfl: 1   ALPRAZolam (XANAX) 0.5 MG tablet, Take 0.5-1 tablets (0.25-0.5 mg total) by mouth 2 (two) times daily as needed for anxiety., Disp: 20 tablet, Rfl: 0   escitalopram (LEXAPRO) 20 MG tablet, Take 1 tablet (20 mg total) by mouth daily., Disp: 90 tablet, Rfl: 1   HYDROcodone bit-homatropine (HYCODAN) 5-1.5 MG/5ML  syrup, Take 5 mLs by mouth every 8 (eight) hours as needed for cough. (Patient not taking: Reported on 10/22/2022), Disp: 120 mL, Rfl: 0 Medication Side Effects: none  Family Medical/ Social History: Changes?  Her husband was diagnosed with atrial fibrillation since our last visit.  He also had CHF, cardioversion was beneficial and he is doing better.  MENTAL HEALTH EXAM:  Last menstrual period 12/09/2013.There is no height or weight on file to calculate BMI.  General Appearance: Casual and Well Groomed  Eye Contact:  Good  Speech:  Clear and Coherent and Normal Rate  Volume:  Normal  Mood:  Euthymic  Affect:  Congruent  Thought Process:  Goal Directed and Descriptions of Associations: Circumstantial  Orientation:  Full (Time, Place, and Person)  Thought Content: Logical   Suicidal Thoughts:  No  Homicidal Thoughts:  No  Memory:  WNL  Judgement:  Good  Insight:  Good  Psychomotor Activity:  Normal  Concentration:  Concentration: Good and Attention Span: Good  Recall:  Good  Fund of Knowledge: Good  Language: Good  Assets:  Desire for Improvement Financial Resources/Insurance Housing Transportation Vocational/Educational  ADL's:  Intact  Cognition: WNL  Prognosis:  Good   DIAGNOSES:    ICD-10-CM   1. Bipolar I disorder ([place])  F31.9     2. Generalized anxiety disorder  F41.1     3. Insomnia, unspecified type  G47.00       Receiving Psychotherapy: No   RECOMMENDATIONS:  PDMP was reviewed.  Last modafinil 07/27/2021.  Xanax filled 11/06/2021. I provided 20 minutes of face to face time during this encounter, including time spent before and after the visit in records review, medical decision making, counseling pertinent to today's visit, and charting.   She is doing well on current meds so no treatment needed.  Continue Xanax 0.5 mg, 1/2-1 p.o. twice daily as needed. Continue Lexapro 20 mg, 1 p.o. every morning. Continue Latuda 20 mg, 1 po with evening meal, at least  350 cal.  Return in 4 to 5 months (when she is out of school)  Donnal Moat, PA-C

## 2022-12-03 ENCOUNTER — Other Ambulatory Visit: Payer: Self-pay | Admitting: Family

## 2022-12-12 ENCOUNTER — Encounter: Payer: Self-pay | Admitting: Internal Medicine

## 2022-12-13 ENCOUNTER — Other Ambulatory Visit: Payer: Self-pay | Admitting: Family

## 2022-12-13 MED ORDER — VALACYCLOVIR HCL 500 MG PO TABS: ORAL_TABLET | ORAL | 0 refills | Status: DC

## 2023-01-08 ENCOUNTER — Ambulatory Visit: Payer: BC Managed Care – PPO | Admitting: Family Medicine

## 2023-01-08 ENCOUNTER — Encounter: Payer: Self-pay | Admitting: Family Medicine

## 2023-01-08 VITALS — BP 120/70 | HR 71 | Temp 98.2°F | Wt 136.8 lb

## 2023-01-08 DIAGNOSIS — S4992XA Unspecified injury of left shoulder and upper arm, initial encounter: Secondary | ICD-10-CM | POA: Diagnosis not present

## 2023-01-08 NOTE — Progress Notes (Signed)
   Subjective:    Patient ID: Lisa Lee, female    DOB: June 02, 1961, 62 y.o.   MRN: [id number]  HPI Here for pain and stiffness in the left shoulder that began in March. No hx of acute injuries, but she says she had been in the habit of carrying her heavy laptop case over her left shoulder until then. Since march she has switched to the other shoulder. She has applied ice and taken some Ibuprofen.    Review of Systems  Constitutional: Negative.   Respiratory: Negative.    Cardiovascular: Negative.   Musculoskeletal:  Positive for arthralgias.       Objective:   Physical Exam Constitutional:      General: She is not in acute distress.    Appearance: Normal appearance.  Cardiovascular:     Rate and Rhythm: Normal rate and regular rhythm.     Pulses: Normal pulses.     Heart sounds: Normal heart sounds.  Pulmonary:     Effort: Pulmonary effort is normal.     Breath sounds: Normal breath sounds.  Musculoskeletal:     Comments: She is tender over the left acromioclavicular joint. ROM of the shoulder is full, but she has pain on full extension and adduction   Neurological:     Mental Status: She is alert.           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 We will refer her to Sports Medicine. I think she would benefit from a steroid injection and PT.  Gershon Crane, MD

## 2023-01-09 NOTE — Telephone Encounter (Signed)
Pt stated that she is having really bad reflux that is causing her discomfort and pain all day. Pt was rescheduled for a sooner appointment to 01/10/2023 at 8:30 AM to see Quentin Mulling PA. Pt made aware. Pt verbalized understanding with all questions answered.

## 2023-01-09 NOTE — Progress Notes (Signed)
01/10/2023 Lisa Lee [id number] 10-06-1960  Referring provider: Madelin Headings, MD Primary GI doctor: Dr. Adela Lank  (Dr. Orvan Falconer)  ASSESSMENT AND PLAN:   Gastroesophageal reflux disease with esophagitis and dysphagia 2019 EGD with gastritis/duodenitis, neg H pylori Stopped protonix a year ago with worsening GERD symptoms, recently started on fosamax, has been on pepcid only with dysphagia, dyspepsia, nocturnal symptoms, LPR symptoms Dysphagia is intermittent, likely secondary to uncontrolled GERD Will put patient back on protonix 40 mg daily with pepcid Lifestyle changes discussed, avoid NSAIDS, ETOH EGD with possible dilatation at [facility] to evaluate for stenosis, tumor, erosive/infectious esophagitis with fosamax use, barrett's, and EOE.   I discussed risks of EGD with patient today, including risk of sedation, bleeding or perforation.  Patient provides understanding and gave verbal consent to proceed.  Family history of colon cancer 2019 colonoscopy with diverticulosis/AVM No symptoms at this time to warrant colonoscopy Recall 06/2023 for screening secondary to family history of colon cancer Recall placed with Dr. Adela Lank  Diverticulosis Will call if any symptoms. Add on fiber supplement, avoid NSAIDS, information given  History of loose stools Given fodmap diet Has improved with stopping sweet and low     Patient Care Team: Panosh, Neta Mends, MD as PCP - Veto Kemps, MD (Ophthalmology) Bernette Redbird, MD as Consulting Physician (Gastroenterology) Tamela Oddi, Faustino Congress, MD as Consulting Physician (Cardiology) Huel Cote, MD as Consulting Physician (Obstetrics and Gynecology)  HISTORY OF PRESENT ILLNESS: 62 y.o. female with a past medical history of bipolar,  colon polyps, family history of colon cancer in father before age 48, prior hemorrhoid surgery, GERD, HTN, lipids, hypothyroidismand others listed below presents  for evaluation of GERD.   07/04/2018 colonoscopy and EGD revealed sigmoid diverticulosis and a small ascending colon AVM.   Recall colon 06/2023 due to family history of colon cancer Upper endoscopy revealed mild gastritis and duodenitis.  Duodenal, gastric, random colon, and terminal ileal biopsies were all normal.  H. pylori testing was negative.  States she had a bad prep experience last time and was very dehydrated and will do milder prep next time.   2019 OV Dr. Orvan Falconer for change in bowel habits Negative celiac, normal ESR/CRP and fecal cal Started on bulk agents , FODMAP, imodium 1/2 tablet as needed.   Presents with worsening GERD, has weaned off PPI.  Labs reviewed show normal H/H, normal platelets, has had mild hypercalcemia, pending PTH ( has been neg in the past),  LFT unremarkable 04/23/22  She states her stools have improved.  She stopped sweet and low and thought this may have helped.   She has been having GERD for a year, has been off her PPI for a year.  Previously she was on protonix 40 mg and pepcid 20 mg BID, she stopped it not because her symptoms were controlled but because her PCP told her she had been on it for too long.  She was recently started on calcium and fosamax for osteoporosis.  She is drinking coffee in the morning.  She is having reflux all the time, during the day and at night.  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  Has dyspepsia without radiation.  She has been having upper intermittent dysphagia, just issue with solids and not liquids.  Denies melena or hematochezia.  She does take iburprofen once a month.  She states rare ETOH due to pain/burning.   She is  on bASA but not on anything blood thinner.  She denies tobacco use.  She denies drug use.    She  reports that she has never smoked. She has never used smokeless tobacco. She reports current alcohol use. She reports that she  does not use drugs.  RELEVANT LABS AND IMAGING: CBC    Component Value Date/Time   WBC 7.1 04/23/2022 1056   RBC 4.56 04/23/2022 1056   HGB 14.4 04/23/2022 1056   HGB 13.2 06/03/2019 0840   HCT 42.7 04/23/2022 1056   HCT 37.8 06/03/2019 0840   PLT 291.0 04/23/2022 1056   PLT 368 06/03/2019 0840   MCV 93.6 04/23/2022 1056   MCV 91 06/03/2019 0840   MCH 31.3 03/16/2022 0556   MCHC 33.8 04/23/2022 1056   RDW 12.5 04/23/2022 1056   RDW 11.8 06/03/2019 0840   LYMPHSABS 2.6 04/23/2022 1056   MONOABS 0.6 04/23/2022 1056   EOSABS 0.3 04/23/2022 1056   BASOSABS 0.1 04/23/2022 1056   Recent Labs    03/16/22 0556 04/23/22 1056  HGB 14.3 14.4    CMP     Component Value Date/Time   NA 139 05/08/2022 0733   NA 139 07/10/2021 0932   K 4.4 05/08/2022 0733   CL 106 05/08/2022 0733   CO2 24 05/08/2022 0733   GLUCOSE 100 (H) 05/08/2022 0733   BUN 28 (H) 05/08/2022 0733   BUN 25 07/10/2021 0932   CREATININE 0.90 05/08/2022 0733   CREATININE 1.17 (H) 04/07/2020 1531   CALCIUM 10.6 (H) 05/08/2022 0733   PROT 7.7 04/23/2022 1056   PROT 6.9 06/03/2019 0840   ALBUMIN 4.8 04/23/2022 1056   ALBUMIN 4.9 06/03/2019 0840   AST 22 04/23/2022 1056   ALT 30 04/23/2022 1056   ALKPHOS 51 04/23/2022 1056   BILITOT 0.5 04/23/2022 1056   BILITOT 0.3 06/03/2019 0840   GFRNONAA 59 (L) 06/03/2019 0840   GFRAA 68 06/03/2019 0840      Latest Ref Rng & Units 04/23/2022   10:56 AM 04/11/2021   11:14 AM 03/29/2020    2:43 PM  Hepatic Function  Total Protein 6.0 - 8.3 g/dL 7.7  7.7  7.0   Albumin 3.5 - 5.2 g/dL 4.8  4.4    AST 0 - 37 U/L 22  23  20    ALT 0 - 35 U/L 30  24  23    Alk Phosphatase 39 - 117 U/L 51  55    Total Bilirubin 0.2 - 1.2 mg/dL 0.5  0.5  0.5   Bilirubin, Direct 0.0 - 0.3 mg/dL 0.1  0.1        Current Medications:   Current Outpatient Medications (Endocrine & Metabolic):    alendronate (FOSAMAX) 70 MG tablet, Take 1 tablet (70 mg total) by mouth every 7 (seven) days.  Take with a full glass of water on an empty stomach.   levothyroxine (SYNTHROID) 75 MCG tablet, TAKE 1 TABLET BY MOUTH EVERY DAY BEFORE BREAKFAST  Current Outpatient Medications (Cardiovascular):    atorvastatin (LIPITOR) 10 MG tablet, Take 1 tablet (10 mg total) by mouth at bedtime.   ezetimibe (ZETIA) 10 MG tablet, Take 1 tablet (10 mg total) by mouth daily.   fenofibrate 160 MG tablet, Take 1 tablet (160 mg total) by mouth at bedtime.  Current Outpatient Medications (Respiratory):    albuterol (PROAIR HFA) 108 (90 Base) MCG/ACT inhaler, Inhale 2 puffs into the lungs every 6 (six) hours as needed for wheezing.  Current Outpatient Medications (Analgesics):  aspirin EC 81 MG tablet, Take 1 tablet (81 mg total) by mouth daily.   Current Outpatient Medications (Other):    ALPRAZolam (XANAX) 0.5 MG tablet, Take 0.5-1 tablets (0.25-0.5 mg total) by mouth 2 (two) times daily as needed for anxiety.   calcium carbonate (OSCAL) 1500 (600 Ca) MG TABS tablet, Take by mouth 2 (two) times daily with a meal.   Cholecalciferol (VITAMIN D) 2000 units tablet, Take 2,000 Units by mouth in the morning and at bedtime.   doxylamine, Sleep, (UNISOM) 25 MG tablet, Take 25 mg by mouth at bedtime.   escitalopram (LEXAPRO) 20 MG tablet, Take 1 tablet (20 mg total) by mouth daily.   famotidine (PEPCID) 20 MG tablet, TAKE 1 TABLET AT BEDTIME (Patient taking differently: Twice a day)   Licorice, Glycyrrhiza glabra, (LICORICE ROOT PO), Take 1 Capful by mouth daily.   loperamide (IMODIUM A-D) 2 MG tablet, Take 2 mg by mouth as needed.   lurasidone (LATUDA) 20 MG TABS tablet, TAKE 1 TABLET DAILY WITH   SUPPER   Multiple Vitamins-Minerals (MULTIVITAMIN WITH MINERALS) tablet, Take 1 tablet by mouth daily.   pantoprazole (PROTONIX) 40 MG tablet, Take 1 tablet (40 mg total) by mouth daily.   valACYclovir (VALTREX) 500 MG tablet, TAKE 1 TABLET DAILY FOR    SUPPRESSION AND 1 TABLET   TWO TIMES A DAY FOR 3 DAYS FOR OUTBREAK  Strength: 500 mg  Medical History:  Past Medical History:  Diagnosis Date   Acid reflux    Asthma    Bipolar disorder (HCC)    Depression    Diarrhea 08/09/2011   Poss ibs  Vs other  Related to stress  consdier other eval if needed. Had colonoscoopy per dr Matthias Hughs    High cholesterol    History of hypertension    no meds for last year  per pt on 03-08-2022   HPV in female 1984   Hypothyroidism    PMB (postmenopausal bleeding)    Thyroid disease    Allergies:  Allergies  Allergen Reactions   Penicillins Hives, Shortness Of Breath and Swelling    Has patient had a PCN reaction causing immediate rash, facial/tongue/throat swelling, SOB or lightheadedness with hypotension: Yes Has patient had a PCN reaction causing severe rash involving mucus membranes or skin necrosis: No Has patient had a PCN reaction that required hospitalization No Has patient had a PCN reaction occurring within the last 10 years: No If all of the above answers are "NO", then may proceed with Cephalosporin use.    Pravastatin Other (See Comments)    Cause muscle aches and cramps   Rosuvastatin Other (See Comments)    Caused pain in hands, shoulders, back, and indigestion     Surgical History:  She  has a past surgical history that includes Hysteroscopy; Tonsillectomy; CERVICAL CRYOTHERAPY (07/30/1982); Lymph node biopsy; Dilation and curettage, diagnostic / therapeutic (07/30/1992); Hysteroscopy with resectoscope (08/04/2009); Colposcopy (08/26/2013); Cervical conization w/bx (N/A, 07/21/2015); Cervical conization w/bx (N/A, 09/25/2018); colonscopy (07/04/2018); Upper gi endoscopy (07/04/2018); Dilatation & curettage/hysteroscopy with myosure (N/A, 03/16/2022); and Operative ultrasound (N/A, 03/16/2022). Family History:  Her family history includes Arthritis in her mother; Colon cancer in her father; Heart disease in her brother, father, mother, and sister; Heart disease (age of onset: 66) in her sister;  Parkinson's disease in her father; Pulmonary embolism in her daughter; Thyroid disease in her daughter and sister.  REVIEW OF SYSTEMS  : All other systems reviewed and negative except where noted in the History of  Present Illness.  PHYSICAL EXAM: BP 120/60   Pulse 72   Ht 5\' 1"  (1.549 m)   Wt 137 lb (62.1 kg)   LMP 12/09/2013   BMI 25.89 kg/m  General Appearance: Well nourished, in no apparent distress. Head:   Normocephalic and atraumatic. Eyes:  sclerae anicteric,conjunctive pink  Respiratory: Respiratory effort normal, BS equal bilaterally without rales, rhonchi, wheezing. Cardio: RRR with no MRGs. Peripheral pulses intact.  Abdomen: Soft,  Non-distended ,active bowel sounds. mild tenderness in the epigastrium. Without guarding and Without rebound. No masses. Rectal: Not evaluated Musculoskeletal: Full ROM, Normal gait. Without edema. Skin:  Dry and intact without significant lesions or rashes Neuro: Alert and  oriented x4;  No focal deficits. Psych:  Cooperative. Normal mood and affect.    Doree Albee, PA-C 9:32 AM

## 2023-01-10 ENCOUNTER — Ambulatory Visit (INDEPENDENT_AMBULATORY_CARE_PROVIDER_SITE_OTHER): Payer: BC Managed Care – PPO

## 2023-01-10 ENCOUNTER — Ambulatory Visit: Payer: BC Managed Care – PPO | Admitting: Physician Assistant

## 2023-01-10 ENCOUNTER — Encounter: Payer: Self-pay | Admitting: Physician Assistant

## 2023-01-10 ENCOUNTER — Ambulatory Visit: Payer: BC Managed Care – PPO | Admitting: Sports Medicine

## 2023-01-10 VITALS — BP 120/60 | HR 72 | Ht 61.0 in | Wt 137.0 lb

## 2023-01-10 VITALS — BP 132/66 | HR 74 | Ht 61.0 in | Wt 136.0 lb

## 2023-01-10 DIAGNOSIS — R1319 Other dysphagia: Secondary | ICD-10-CM

## 2023-01-10 DIAGNOSIS — G8929 Other chronic pain: Secondary | ICD-10-CM | POA: Diagnosis not present

## 2023-01-10 DIAGNOSIS — Z800 Family history of malignant neoplasm of digestive organs: Secondary | ICD-10-CM

## 2023-01-10 DIAGNOSIS — M25512 Pain in left shoulder: Secondary | ICD-10-CM

## 2023-01-10 DIAGNOSIS — K2100 Gastro-esophageal reflux disease with esophagitis, without bleeding: Secondary | ICD-10-CM

## 2023-01-10 MED ORDER — MELOXICAM 15 MG PO TABS: 15.0000 mg | ORAL_TABLET | Freq: Every day | ORAL | 0 refills | Status: DC

## 2023-01-10 MED ORDER — PANTOPRAZOLE SODIUM 40 MG PO TBEC
40.0000 mg | DELAYED_RELEASE_TABLET | Freq: Every day | ORAL | 3 refills | Status: DC
Start: 2023-01-10 — End: 2023-02-05

## 2023-01-10 NOTE — Patient Instructions (Addendum)
Please take your proton pump inhibitor medication, protonix  Please take this medication 30 minutes to 1 hour before meals- this makes it more effective.  Avoid spicy and acidic foods Avoid fatty foods Limit your intake of coffee, tea, alcohol, and carbonated drinks Work to maintain a healthy weight Keep the head of the bed elevated at least 3 inches with blocks or a wedge pillow if you are having any nighttime symptoms Stay upright for 2 hours after eating Avoid meals and snacks three to four hours before bedtime    FODMAP stands for fermentable oligo-, di-, mono-saccharides and polyols (1). These are the scientific terms used to classify groups of carbs that are notorious for triggering digestive symptoms like bloating, gas and stomach pain.     Due to recent changes in healthcare laws, you may see the results of your imaging and laboratory studies on MyChart before your provider has had a chance to review them.  We understand that in some cases there may be results that are confusing or concerning to you. Not all laboratory results come back in the same time frame and the provider may be waiting for multiple results in order to interpret others.  Please give [place] 48 hours in order for your provider to thoroughly review all the results before contacting the office for clarification of your results.    I appreciate the  opportunity to care for you  Thank You   [facility]

## 2023-01-10 NOTE — Progress Notes (Signed)
Agree with assessment / plan as outlined.  

## 2023-01-10 NOTE — Progress Notes (Signed)
Lisa Lee D.[facility] [address] [place] [address] Phone: [phone]   Assessment and Plan:     1. Chronic left shoulder pain -Chronic with exacerbation, initial sports medicine visit - Left shoulder pain ongoing for 3 months.  Suspect components of rotator cuff pathology which include impingement syndrome, as well as inflammation at [facility] joint with underlying osteoarthritis - Start HEP for rotator cuff - Start meloxicam 15 mg daily x2 weeks.  If still having pain after 2 weeks, complete 3rd-week of meloxicam. May use remaining meloxicam as needed once daily for pain control.  Do not to use additional NSAIDs while taking meloxicam.  May use Tylenol [phone] mg 2 to 3 times a day for breakthrough pain. - X-ray obtained in clinic.  My interpretation: No acute fracture or dislocation.  Moderate degenerative changes at [facility] joint.  Other orders - meloxicam (MOBIC) 15 MG tablet; Take 1 tablet (15 mg total) by mouth daily.    Pertinent previous records reviewed include family medicine note 01/08/2023   Follow Up: 4 weeks for reevaluation.  If no improvement or worsening of symptoms, could consider subacromial versus AC joint CSI   Subjective:   I, Lisa Lee, am serving as a Neurosurgeon for Lisa Lee  Chief Complaint: left shoulder pain   HPI:   01/10/23 Patient is a 62 year old female complaining of left shoulder pain. Patient states pain and stiffness in the left shoulder that began in March. No MOI,  decreased ROM, pain radiates dwn to her thumb with numbness and tingling.  says she had been in the habit of carrying her heavy laptop case over her left shoulder until then. Since march she has switched to the other shoulder. She has applied ice and taken some Ibuprofen.  PCP told her it was her [facility] joint   Relevant Historical Information: GERD, hypothyroidism  Additional pertinent review of systems negative.   Current  Outpatient Medications:    meloxicam (MOBIC) 15 MG tablet, Take 1 tablet (15 mg total) by mouth daily., Disp: 30 tablet, Rfl: 0   albuterol (PROAIR HFA) 108 (90 Base) MCG/ACT inhaler, Inhale 2 puffs into the lungs every 6 (six) hours as needed for wheezing., Disp: 1 each, Rfl: 1   alendronate (FOSAMAX) 70 MG tablet, Take 1 tablet (70 mg total) by mouth every 7 (seven) days. Take with a full glass of water on an empty stomach., Disp: 4 tablet, Rfl: 11   ALPRAZolam (XANAX) 0.5 MG tablet, Take 0.5-1 tablets (0.25-0.5 mg total) by mouth 2 (two) times daily as needed for anxiety., Disp: 20 tablet, Rfl: 0   aspirin EC 81 MG tablet, Take 1 tablet (81 mg total) by mouth daily., Disp: , Rfl:    atorvastatin (LIPITOR) 10 MG tablet, Take 1 tablet (10 mg total) by mouth at bedtime., Disp: 90 tablet, Rfl: 4   calcium carbonate (OSCAL) 1500 (600 Ca) MG TABS tablet, Take by mouth 2 (two) times daily with a meal., Disp: , Rfl:    Cholecalciferol (VITAMIN D) 2000 units tablet, Take 2,000 Units by mouth in the morning and at bedtime., Disp: , Rfl:    doxylamine, Sleep, (UNISOM) 25 MG tablet, Take 25 mg by mouth at bedtime., Disp: , Rfl:    escitalopram (LEXAPRO) 20 MG tablet, Take 1 tablet (20 mg total) by mouth daily., Disp: 90 tablet, Rfl: 1   ezetimibe (ZETIA) 10 MG tablet, Take 1 tablet (10 mg total) by mouth daily., Disp: 90  tablet, Rfl: 4   famotidine (PEPCID) 20 MG tablet, TAKE 1 TABLET AT BEDTIME (Patient taking differently: Twice a day), Disp: 90 tablet, Rfl: 3   fenofibrate 160 MG tablet, Take 1 tablet (160 mg total) by mouth at bedtime., Disp: 60 tablet, Rfl: 4   levothyroxine (SYNTHROID) 75 MCG tablet, TAKE 1 TABLET BY MOUTH EVERY DAY BEFORE BREAKFAST, Disp: 90 tablet, Rfl: 3   Licorice, Glycyrrhiza glabra, (LICORICE ROOT PO), Take 1 Capful by mouth daily., Disp: , Rfl:    loperamide (IMODIUM A-D) 2 MG tablet, Take 2 mg by mouth as needed., Disp: , Rfl:    lurasidone (LATUDA) 20 MG TABS tablet, TAKE 1  TABLET DAILY WITH   SUPPER, Disp: 90 tablet, Rfl: 3   Multiple Vitamins-Minerals (MULTIVITAMIN WITH MINERALS) tablet, Take 1 tablet by mouth daily., Disp: , Rfl:    pantoprazole (PROTONIX) 40 MG tablet, Take 1 tablet (40 mg total) by mouth daily., Disp: 90 tablet, Rfl: 3   valACYclovir (VALTREX) 500 MG tablet, TAKE 1 TABLET DAILY FOR    SUPPRESSION AND 1 TABLET   TWO TIMES A DAY FOR 3 DAYS FOR OUTBREAK Strength: 500 mg, Disp: 90 tablet, Rfl: 0   Objective:     Vitals:   01/10/23 0950  BP: 132/66  Pulse: 74  SpO2: 96%  Weight: 136 lb (61.7 kg)  Height: 5\' 1"  (1.549 m)      Body mass index is 25.7 kg/m.    Physical Exam:    Gen: Appears well, nad, nontoxic and pleasant Neuro:sensation intact, strength is 5/5 with df/pf/inv/ev, muscle tone wnl Skin: no suspicious lesion or defmority Psych: A&O, appropriate mood and affect  Left shoulder:  No deformity, swelling or muscle wasting No scapular winging FF 180, abd 140 (with pain from 80-140), int 20, ext 90 TTP AC, deltoid NTTP over the Funk, clavicle,  , coracoid, biceps groove, humerus,  , trapezius, cervical spine Positive Hawkins, empty can, crossarm Neg neer, obriens, , subscap liftoff, speeds Neg ant drawer, sulcus sign, apprehension Negative Spurling's test bilat FROM of neck    Electronically signed by:  Lisa Lee D.[facility] 10:55 AM 01/10/23

## 2023-01-10 NOTE — Patient Instructions (Signed)
-   Start meloxicam 15 mg daily x2 weeks.  If still having pain after 2 weeks, complete 3rd-week of meloxicam. May use remaining meloxicam as needed once daily for pain control.  Do not to use additional NSAIDs while taking meloxicam.  May use Tylenol [phone] mg 2 to 3 times a day for breakthrough pain. Shoulder HEP  4 week follow up

## 2023-01-11 ENCOUNTER — Encounter: Payer: Self-pay | Admitting: Gastroenterology

## 2023-01-11 ENCOUNTER — Encounter: Payer: Self-pay | Admitting: Internal Medicine

## 2023-01-11 ENCOUNTER — Ambulatory Visit (AMBULATORY_SURGERY_CENTER): Payer: BC Managed Care – PPO | Admitting: Gastroenterology

## 2023-01-11 VITALS — BP 129/70 | HR 68 | Temp 98.4°F | Resp 10 | Ht 61.0 in | Wt 137.0 lb

## 2023-01-11 DIAGNOSIS — K2100 Gastro-esophageal reflux disease with esophagitis, without bleeding: Secondary | ICD-10-CM | POA: Diagnosis present

## 2023-01-11 DIAGNOSIS — R1319 Other dysphagia: Secondary | ICD-10-CM

## 2023-01-11 DIAGNOSIS — K222 Esophageal obstruction: Secondary | ICD-10-CM | POA: Diagnosis not present

## 2023-01-11 DIAGNOSIS — R1314 Dysphagia, pharyngoesophageal phase: Secondary | ICD-10-CM | POA: Diagnosis not present

## 2023-01-11 HISTORY — PX: UPPER GASTROINTESTINAL ENDOSCOPY: SHX188

## 2023-01-11 MED ORDER — SODIUM CHLORIDE 0.9 % IV SOLN: 500.0000 mL | Freq: Once | INTRAVENOUS | Status: DC

## 2023-01-11 NOTE — Progress Notes (Signed)
Uneventful anesthetic. Report to pacu rn. Vss. Care resumed by rn. 

## 2023-01-11 NOTE — Op Note (Signed)
[facility] Patient Name: Lisa Lee Procedure Date: 01/11/2023 3:15 PM MRN: [id number] Endoscopist: Viviann Spare P. Adela Lank , MD, [id number] Age: 62 Referring MD:  Date of Birth: 10/19/1960 Gender: Female Account #: [id number] Procedure:                Upper GI endoscopy Indications:              Dysphagia, Follow-up of gastro-esophageal reflux                            disease - previously on protonix (she states this                            never really controlled her symptoms), switched to                            pepcid which has not worked well and symptoms got                            worse. Recurrent GERD and cough, has since                            developed dysphagia. EGD to further evaluate. Now                            back on protonix 40mg  / day as of yesterday Medicines:                Monitored Anesthesia Care Procedure:                Pre-Anesthesia Assessment:                           - Prior to the procedure, a History and Physical                            was performed, and patient medications and                            allergies were reviewed. The patient's tolerance of                            previous anesthesia was also reviewed. The risks                            and benefits of the procedure and the sedation                            options and risks were discussed with the patient.                            All questions were answered, and informed consent                            was obtained. Prior Anticoagulants: The patient has  taken no anticoagulant or antiplatelet agents. ASA                            Grade Assessment: II - A patient with mild systemic                            disease. After reviewing the risks and benefits,                            the patient was deemed in satisfactory condition to                            undergo the procedure.                            After obtaining informed consent, the endoscope was                            passed under direct vision. Throughout the                            procedure, the patient's blood pressure, pulse, and                            oxygen saturations were monitored continuously. The                            GIF [id number] #[id number] was introduced through the                            mouth, and advanced to the second part of duodenum.                            The upper GI endoscopy was accomplished without                            difficulty. The patient tolerated the procedure                            well. Scope In: Scope Out: Findings:                 Esophagogastric landmarks were identified: the                            Z-line was found at 37 cm, the gastroesophageal                            junction was found at 37 cm and the upper extent of                            the gastric folds was found at 37 cm from the  incisors.                           The gastroesophageal flap valve was visualized                            endoscopically and classified as Hill Grade II                            (fold present, opens with respiration).                           LA Grade A esophagitis was found at the GEJ.                           One benign-appearing, intrinsic subtle / mild                            stenosis was found 37 cm from the incisors. This                            stenosis measured less than one cm (in length). A                            TTS dilator was passed through the scope. Dilation                            with an 18-19-20 mm balloon dilator was performed                            to just 18 mm at which point appropriate dilation                            effect was noted (suspect around 17mm or so with                            the balloon)                           The exam of the esophagus was otherwise normal.                            Biopsies were obtained from the proximal and distal                            esophagus with cold forceps to rule out                            eosinophilic esophagitis.                           Patchy mildly erythematous mucosa was found in the  gastric body and in the gastric antrum.                           The exam of the stomach was otherwise normal.                           Biopsies were taken with a cold forceps for                            Helicobacter pylori testing.                           The examined duodenum was normal. Complications:            No immediate complications. Estimated blood loss:                            Minimal. Estimated Blood Loss:     Estimated blood loss was minimal. Impression:               - Esophagogastric landmarks identified.                           - Gastroesophageal flap valve classified as Hill                            Grade II (fold present, opens with respiration).                           - LA Grade A reflux esophagitis.                           - Benign-appearing esophageal stenosis. Dilated as                            outlined above.                           - Normal esophagus otherwise - biopsies taken to                            rule out EoE.                           - Erythematous mucosa in the gastric body and                            antrum.                           - Normal stomach otherwise - biopsies taken to rule                            out H pylori                           - Normal examined duodenum. Recommendation:           -  Patient has a contact number available for                            emergencies. The signs and symptoms of potential                            delayed complications were discussed with the                            patient. Return to normal activities tomorrow.                            Written discharge instructions were provided to the                             patient.                           - Post dilation diet                           - Continue present medications.                           - Resume protonix 40mg  once to twice daily (use                            lowest dose needed to control symptoms                           - If higher dosing of protonix does not control                            symptoms or you wish to come off antacids                            altoghether and consider alternatives, patient is a                            candidate for TIF - can discuss with her                           - Await pathology results and course post dilation                            and on higher dose protonix Traci Plemons P. Aarthi Uyeno, MD 01/11/2023 3:41:36 PM This report has been signed electronically.

## 2023-01-11 NOTE — Progress Notes (Signed)
Pt's states no medical or surgical changes since previsit or office visit. 

## 2023-01-11 NOTE — Progress Notes (Signed)
[facility] History and Physical   Primary Care Physician:  Madelin Headings, MD   Reason for Procedure:   GERD, dysphagia  Plan:    EGD with possible dilation     HPI: Lisa Lee is a 62 y.o. female  here for EGD to evaluate worsening reflux and dysphagia. Last exam 2019 was normal. Was on pepcid which did not work to control her reflux. Placed back on protonix yesterday. No interval changes since office visit yesterday. EGD to further evaluate and perform dilation if needed for dysphagia.  Otherwise feels well without any cardiopulmonary symptoms.   I have discussed risks / benefits of anesthesia and endoscopic procedure with Lisa Lee and they wish to proceed with the exams as outlined today.    Past Medical History:  Diagnosis Date   Acid reflux    Asthma    Bipolar disorder (HCC)    Depression    Diarrhea 08/09/2011   Poss ibs  Vs other  Related to stress  consdier other eval if needed. Had colonoscoopy per dr Matthias Hughs    High cholesterol    History of hypertension    no meds for last year  per pt on 03-08-2022   HPV in female 1984   Hypothyroidism    PMB (postmenopausal bleeding)    Thyroid disease     Past Surgical History:  Procedure Laterality Date   CERVICAL CONIZATION W/BX N/A 07/21/2015   Procedure: CONIZATION CERVIX WITH BIOPSY;  Surgeon: Kirkland Hun, MD;  Location: [facility];  Service: Gynecology;  Laterality: N/A;   CERVICAL CONIZATION W/BX N/A 09/25/2018   Procedure: CONIZATION CERVIX WITH BIOPSY;  Surgeon: Adolphus Birchwood, MD;  Location: [facility];  Service: Gynecology;  Laterality: N/A;   CERVICAL CRYOTHERAPY  07/30/1982   colonscopy  07/04/2018   COLPOSCOPY  08/26/2013   DILATATION & CURETTAGE/HYSTEROSCOPY WITH MYOSURE N/A 03/16/2022   Procedure: ATTEMPTED DILATATION & CURETTAGE/ATTEMPTED HYSTEROSCOPY WITH MYOSURE, POLYPECTOMY;  Surgeon: Huel Cote, MD;  Location: [facility];   Service: Gynecology;  Laterality: N/A;   DILATION AND CURETTAGE, DIAGNOSTIC / THERAPEUTIC  07/30/1992   Blighted Ovum   HYSTEROSCOPY     uterine polypectomy Jan 7th   HYSTEROSCOPY WITH RESECTOSCOPE  08/04/2009   Removed polyp & IUD   LYMPH NODE BIOPSY     40 yrs ago   OPERATIVE ULTRASOUND N/A 03/16/2022   Procedure: OPERATIVE ULTRASOUND;  Surgeon: Huel Cote, MD;  Location: [facility];  Service: Gynecology;  Laterality: N/A;   TONSILLECTOMY     as child   UPPER GASTROINTESTINAL ENDOSCOPY  01/11/2023   UPPER GI ENDOSCOPY  07/04/2018    Prior to Admission medications   Medication Sig Start Date End Date Taking? Authorizing Provider  alendronate (FOSAMAX) 70 MG tablet Take 1 tablet (70 mg total) by mouth every 7 (seven) days. Take with a full glass of water on an empty stomach. 10/19/22  Yes Worthy Rancher B, FNP  aspirin EC 81 MG tablet Take 1 tablet (81 mg total) by mouth daily. 08/28/11  Yes Laurey Morale, MD  atorvastatin (LIPITOR) 10 MG tablet Take 1 tablet (10 mg total) by mouth at bedtime. 08/28/22  Yes Meriam Sprague, MD  calcium carbonate (OSCAL) 1500 (600 Ca) MG TABS tablet Take by mouth 2 (two) times daily with a meal.   Yes [provider]  Cholecalciferol (VITAMIN D) 2000 units tablet Take 2,000 Units by mouth in the morning and at bedtime.  Yes [provider]  doxylamine, Sleep, (UNISOM) 25 MG tablet Take 25 mg by mouth at bedtime.   Yes [provider]  escitalopram (LEXAPRO) 20 MG tablet Take 1 tablet (20 mg total) by mouth daily. 10/22/22  Yes Melony Overly T, PA-C  ezetimibe (ZETIA) 10 MG tablet Take 1 tablet (10 mg total) by mouth daily. 08/28/22  Yes Meriam Sprague, MD  famotidine (PEPCID) 20 MG tablet TAKE 1 TABLET AT BEDTIME Patient taking differently: Twice a day 04/01/20  Yes Panosh, Neta Mends, MD  fenofibrate 160 MG tablet Take 1 tablet (160 mg total) by mouth at bedtime. 08/28/22  Yes Meriam Sprague, MD   levothyroxine (SYNTHROID) 75 MCG tablet TAKE 1 TABLET BY MOUTH EVERY DAY BEFORE BREAKFAST 04/27/22  Yes Panosh, Neta Mends, MD  Licorice, Glycyrrhiza glabra, (LICORICE ROOT PO) Take 1 Capful by mouth daily.   Yes [provider]  lurasidone (LATUDA) 20 MG TABS tablet TAKE 1 TABLET DAILY WITH   SUPPER 03/12/22  Yes Hurst, Teresa T, PA-C  Multiple Vitamins-Minerals (MULTIVITAMIN WITH MINERALS) tablet Take 1 tablet by mouth daily.   Yes [provider]  pantoprazole (PROTONIX) 40 MG tablet Take 1 tablet (40 mg total) by mouth daily. 01/10/23  Yes Quentin Mulling R, PA-C  valACYclovir (VALTREX) 500 MG tablet TAKE 1 TABLET DAILY FOR    SUPPRESSION AND 1 TABLET   TWO TIMES A DAY FOR 3 DAYS FOR OUTBREAK Strength: 500 mg 12/13/22  Yes Worthy Rancher B, FNP  albuterol (PROAIR HFA) 108 (90 Base) MCG/ACT inhaler Inhale 2 puffs into the lungs every 6 (six) hours as needed for wheezing. 03/29/20   Panosh, Neta Mends, MD  ALPRAZolam Prudy Feeler) 0.5 MG tablet Take 0.5-1 tablets (0.25-0.5 mg total) by mouth 2 (two) times daily as needed for anxiety. 10/22/22   Melony Overly T, PA-C  estradiol (ESTRACE) 0.1 MG/GM vaginal cream Place vaginally.    [provider]  loperamide (IMODIUM A-D) 2 MG tablet Take 2 mg by mouth as needed.    [provider]  meloxicam (MOBIC) 15 MG tablet Take 1 tablet (15 mg total) by mouth daily. Patient not taking: Reported on 01/11/2023 01/10/23   [name], DO    Current Outpatient Medications  Medication Sig Dispense Refill   alendronate (FOSAMAX) 70 MG tablet Take 1 tablet (70 mg total) by mouth every 7 (seven) days. Take with a full glass of water on an empty stomach. 4 tablet 11   aspirin EC 81 MG tablet Take 1 tablet (81 mg total) by mouth daily.     atorvastatin (LIPITOR) 10 MG tablet Take 1 tablet (10 mg total) by mouth at bedtime. 90 tablet 4   calcium carbonate (OSCAL) 1500 (600 Ca) MG TABS tablet Take by mouth 2 (two) times daily with a meal.      Cholecalciferol (VITAMIN D) 2000 units tablet Take 2,000 Units by mouth in the morning and at bedtime.     doxylamine, Sleep, (UNISOM) 25 MG tablet Take 25 mg by mouth at bedtime.     escitalopram (LEXAPRO) 20 MG tablet Take 1 tablet (20 mg total) by mouth daily. 90 tablet 1   ezetimibe (ZETIA) 10 MG tablet Take 1 tablet (10 mg total) by mouth daily. 90 tablet 4   famotidine (PEPCID) 20 MG tablet TAKE 1 TABLET AT BEDTIME (Patient taking differently: Twice a day) 90 tablet 3   fenofibrate 160 MG tablet Take 1 tablet (160 mg total) by mouth at bedtime. 60 tablet  4   levothyroxine (SYNTHROID) 75 MCG tablet TAKE 1 TABLET BY MOUTH EVERY DAY BEFORE BREAKFAST 90 tablet 3   Licorice, Glycyrrhiza glabra, (LICORICE ROOT PO) Take 1 Capful by mouth daily.     lurasidone (LATUDA) 20 MG TABS tablet TAKE 1 TABLET DAILY WITH   SUPPER 90 tablet 3   Multiple Vitamins-Minerals (MULTIVITAMIN WITH MINERALS) tablet Take 1 tablet by mouth daily.     pantoprazole (PROTONIX) 40 MG tablet Take 1 tablet (40 mg total) by mouth daily. 90 tablet 3   valACYclovir (VALTREX) 500 MG tablet TAKE 1 TABLET DAILY FOR    SUPPRESSION AND 1 TABLET   TWO TIMES A DAY FOR 3 DAYS FOR OUTBREAK Strength: 500 mg 90 tablet 0   albuterol (PROAIR HFA) 108 (90 Base) MCG/ACT inhaler Inhale 2 puffs into the lungs every 6 (six) hours as needed for wheezing. 1 each 1   ALPRAZolam (XANAX) 0.5 MG tablet Take 0.5-1 tablets (0.25-0.5 mg total) by mouth 2 (two) times daily as needed for anxiety. 20 tablet 0   estradiol (ESTRACE) 0.1 MG/GM vaginal cream Place vaginally.     loperamide (IMODIUM A-D) 2 MG tablet Take 2 mg by mouth as needed.     meloxicam (MOBIC) 15 MG tablet Take 1 tablet (15 mg total) by mouth daily. (Patient not taking: Reported on 01/11/2023) 30 tablet 0   Current Facility-Administered Medications  Medication Dose Route Frequency Provider Last Rate Last Admin   0.9 %  sodium chloride infusion  500 mL Intravenous Once Jilliane Kazanjian, Willaim Rayas,  MD        Allergies as of 01/11/2023 - Review Complete 01/11/2023  Allergen Reaction Noted   Penicillins Hives, Shortness Of Breath, and Swelling 10/30/2019   Pravastatin Other (See Comments) 08/31/2016   Rosuvastatin Other (See Comments) 01/09/2016    Family History  Problem Relation Age of Onset   Arthritis Mother    Heart disease Mother    Rectal cancer Father    Heart disease Father    Colon cancer Father    Parkinson's disease Father    Heart disease Sister 53       cabg stent   Heart disease Sister    Thyroid disease Sister    Heart disease Brother    Pulmonary embolism Daughter    Thyroid disease Daughter    Esophageal cancer Neg Hx    Stomach cancer Neg Hx     Social History   Socioeconomic History   Marital status: Married    Spouse name: Not on file   Number of children: Not on file   Years of education: Not on file   Highest education level: Bachelor's degree (e.g., BA, AB, BS)  Occupational History   Not on file  Tobacco Use   Smoking status: Never   Smokeless tobacco: Never  Vaping Use   Vaping Use: Never used  Substance and Sexual Activity   Alcohol use: Yes    Alcohol/week: 0.0 standard drinks of alcohol    Comment: monthly ocassional drink   Drug use: No   Sexual activity: Yes    Birth control/protection: None  Other Topics Concern   Not on file  Social History Narrative    [employer] 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    Divorced   Regular exercise-  Not as much recently    [facility] of 2   Pets 2 cats 1 dog to move   Daughter  On recovery heroin  Social Determinants of Health   Financial Resource Strain: Low Risk  (01/07/2023)   Overall Financial Resource Strain (CARDIA)    Difficulty of Paying Living Expenses: Not hard at all  Food Insecurity: No Food Insecurity (01/07/2023)   Hunger Vital Sign    Worried About Running Out of Food in the Last Year: Never true    Ran Out of Food in the Last Year:  Never true  Transportation Needs: No Transportation Needs (01/07/2023)   PRAPARE - Administrator, Civil Service (Medical): No    Lack of Transportation (Non-Medical): No  Physical Activity: Unknown (01/07/2023)   Exercise Vital Sign    Days of Exercise per Week: 0 days    Minutes of Exercise per Session: Not on file  Stress: Stress Concern Present (01/07/2023)   [employer] of Occupational Health - Occupational Stress Questionnaire    Feeling of Stress : To some extent  Social Connections: Moderately Isolated (01/07/2023)   Social Connection and Isolation Panel [NHANES]    Frequency of Communication with Friends and Family: More than three times a week    Frequency of Social Gatherings with Friends and Family: Once a week    Attends Religious Services: Never    Database administrator or Organizations: No    Attends Engineer, structural: Not on file    Marital Status: Married  Catering manager Violence: Not on file    Review of Systems: All other review of systems negative except as mentioned in the HPI.  Physical Exam: Vital signs BP 124/64   Pulse 72   Temp 98.4 F (36.9 C) (Temporal)   Ht 5\' 1"  (1.549 m)   Wt 137 lb (62.1 kg)   LMP 12/09/2013   SpO2 95%   BMI 25.89 kg/m   General:   Alert,  Well-developed, pleasant and cooperative in NAD Lungs:  Clear throughout to auscultation.   Heart:  Regular rate and rhythm Abdomen:  Soft, nontender and nondistended.   Neuro/Psych:  Alert and cooperative. Normal mood and affect. A and O x 3  Harlin Rain, MD [facility]

## 2023-01-11 NOTE — Patient Instructions (Signed)
Discharge instructions given. Handout on Dilatation diet and TIF procedure. Resume previous medications. YOU HAD AN ENDOSCOPIC PROCEDURE TODAY AT THE [facility]:   Refer to the procedure report that was given to you for any specific questions about what was found during the examination.  If the procedure report does not answer your questions, please call your gastroenterologist to clarify.  If you requested that your care partner not be given the details of your procedure findings, then the procedure report has been included in a sealed envelope for you to review at your convenience later.  YOU SHOULD EXPECT: Some feelings of bloating in the abdomen. Passage of more gas than usual.  Walking can help get rid of the air that was put into your GI tract during the procedure and reduce the bloating. If you had a lower endoscopy (such as a colonoscopy or flexible sigmoidoscopy) you may notice spotting of blood in your stool or on the toilet paper. If you underwent a bowel prep for your procedure, you may not have a normal bowel movement for a few days.  Please Note:  You might notice some irritation and congestion in your nose or some drainage.  This is from the oxygen used during your procedure.  There is no need for concern and it should clear up in a day or so.  SYMPTOMS TO REPORT IMMEDIATELY:   Following upper endoscopy (EGD)  Vomiting of blood or coffee ground material  New chest pain or pain under the shoulder blades  Painful or persistently difficult swallowing  New shortness of breath  Fever of 100F or higher  Black, tarry-looking stools  For urgent or emergent issues, a gastroenterologist can be reached at any hour by calling [phone]. Do not use MyChart messaging for urgent concerns.    DIET:  We do recommend a small meal at first, but then you may proceed to your regular diet.  Drink plenty of fluids but you should avoid alcoholic beverages for 24 hours.  ACTIVITY:   You should plan to take it easy for the rest of today and you should NOT DRIVE or use heavy machinery until tomorrow (because of the sedation medicines used during the test).    FOLLOW UP: Our staff will call the number listed on your records the next business day following your procedure.  We will call around 7:15- 8:00 am to check on you and address any questions or concerns that you may have regarding the information given to you following your procedure. If we do not reach you, we will leave a message.     If any biopsies were taken you will be contacted by phone or by letter within the next 1-3 weeks.  Please call us at [phone] if you have not heard about the biopsies in 3 weeks.    SIGNATURES/CONFIDENTIALITY: You and/or your care partner have signed paperwork which will be entered into your electronic medical record.  These signatures attest to the fact that that the information above on your After Visit Summary has been reviewed and is understood.  Full responsibility of the confidentiality of this discharge information lies with you and/or your care-partner.

## 2023-01-11 NOTE — Progress Notes (Signed)
Called to room to assist during endoscopic procedure.  Patient ID and intended procedure confirmed with present staff. Received instructions for my participation in the procedure from the performing physician.  

## 2023-01-14 ENCOUNTER — Telehealth: Payer: Self-pay | Admitting: *Deleted

## 2023-01-14 NOTE — Telephone Encounter (Signed)
  Follow up Call-     01/11/2023    2:33 PM  Call back number  Post procedure Call Back phone  # [phone]  Permission to leave phone message Yes     Patient questions:  Do you have a fever, pain , or abdominal swelling? No. Pain Score  0 *  Have you tolerated food without any problems? Yes.    Have you been able to return to your normal activities? Yes.    Do you have any questions about your discharge instructions: Diet   No. Medications  No. Follow up visit  No.  Do you have questions or concerns about your Care? No.  Actions: * If pain score is 4 or above: No action needed, pain <4.

## 2023-01-30 ENCOUNTER — Encounter: Payer: Self-pay | Admitting: Internal Medicine

## 2023-01-30 ENCOUNTER — Ambulatory Visit (INDEPENDENT_AMBULATORY_CARE_PROVIDER_SITE_OTHER): Payer: BC Managed Care – PPO | Admitting: Internal Medicine

## 2023-01-30 DIAGNOSIS — E039 Hypothyroidism, unspecified: Secondary | ICD-10-CM | POA: Diagnosis not present

## 2023-01-30 DIAGNOSIS — M810 Age-related osteoporosis without current pathological fracture: Secondary | ICD-10-CM | POA: Diagnosis not present

## 2023-01-30 DIAGNOSIS — Z79899 Other long term (current) drug therapy: Secondary | ICD-10-CM

## 2023-01-30 DIAGNOSIS — Z8719 Personal history of other diseases of the digestive system: Secondary | ICD-10-CM

## 2023-01-30 NOTE — Patient Instructions (Addendum)
Good to see you today  Update labs.  May have you back off  vit d calcium depending  on results .   Will look through   record or any other advice .

## 2023-01-30 NOTE — Progress Notes (Signed)
Chief Complaint  Patient presents with   Labs Only    Patient sent a Mychart message requesting lab testing due to history of elevated calcium level and Worthy Rancher, NP advised an office visit with PCP   Medication Refill    Patient requests an actual Rx for Oscal    HPI: Lisa Lee 62 y.o. come in for fu of continued elevated calcium   Dx osteoporosis by dexa  and has been on  on calcium for dec dexa  and vit d  equivalent 1200 mg calcium and 4000IU vit d  was supposed to be on oscal but never got called in to rx   Back on protonix  had esophageal   dilatation.   Cough all better   using wedge and protonix. .Much better  and mobic .    Had been on fosomax not commented gi  was taking  correctly so not told to stop ?  No hx of renal stones  ROS: See pertinent positives and negatives per HPI. Aside duaghter and part have pos covid and coming to visit ( she had covid and  had very mild illness)   Past Medical History:  Diagnosis Date   Acid reflux    Asthma    Bipolar disorder (HCC)    Depression    Diarrhea 08/09/2011   Poss ibs  Vs other  Related to stress  consdier other eval if needed. Had colonoscoopy per dr Matthias Hughs    High cholesterol    History of hypertension    no meds for last year  per pt on 03-08-2022   HPV in female 1984   Hypothyroidism    PMB (postmenopausal bleeding)    Thyroid disease     Family History  Problem Relation Age of Onset   Arthritis Mother    Heart disease Mother    Rectal cancer Father    Heart disease Father    Colon cancer Father    Parkinson's disease Father    Heart disease Sister 65       cabg stent   Heart disease Sister    Thyroid disease Sister    Heart disease Brother    Pulmonary embolism Daughter    Thyroid disease Daughter    Esophageal cancer Neg Hx    Stomach cancer Neg Hx     Social History   Socioeconomic History   Marital status: Married    Spouse name: Not on file   Number of children: Not on  file   Years of education: Not on file   Highest education level: Bachelor's degree (e.g., BA, AB, BS)  Occupational History   Not on file  Tobacco Use   Smoking status: Never   Smokeless tobacco: Never  Vaping Use   Vaping Use: Never used  Substance and Sexual Activity   Alcohol use: Yes    Alcohol/week: 0.0 standard drinks of alcohol    Comment: monthly ocassional drink   Drug use: No   Sexual activity: Yes    Birth control/protection: None  Other Topics Concern   Not on file  Social History Narrative    [employer]. 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    Divorced   Regular exercise-  Not as much recently    [facility] of 2   Pets 2 cats 1 dog to move   Daughter  On recovery heroin   Social Determinants of Health   Financial Resource Strain:  Low Risk  (01/07/2023)   Overall Financial Resource Strain (CARDIA)    Difficulty of Paying Living Expenses: Not hard at all  Food Insecurity: No Food Insecurity (01/07/2023)   Hunger Vital Sign    Worried About Running Out of Food in the Last Year: Never true    Ran Out of Food in the Last Year: Never true  Transportation Needs: No Transportation Needs (01/07/2023)   PRAPARE - Administrator, Civil Service (Medical): No    Lack of Transportation (Non-Medical): No  Physical Activity: Unknown (01/07/2023)   Exercise Vital Sign    Days of Exercise per Week: 0 days    Minutes of Exercise per Session: Not on file  Stress: Stress Concern Present (01/07/2023)   [employer] of Occupational Health - Occupational Stress Questionnaire    Feeling of Stress : To some extent  Social Connections: Moderately Isolated (01/07/2023)   Social Connection and Isolation Panel [NHANES]    Frequency of Communication with Friends and Family: More than three times a week    Frequency of Social Gatherings with Friends and Family: Once a week    Attends Religious Services: Never    Database administrator or  Organizations: No    Attends Engineer, structural: Not on file    Marital Status: Married    Outpatient Medications Prior to Visit  Medication Sig Dispense Refill   albuterol (PROAIR HFA) 108 (90 Base) MCG/ACT inhaler Inhale 2 puffs into the lungs every 6 (six) hours as needed for wheezing. 1 each 1   alendronate (FOSAMAX) 70 MG tablet Take 1 tablet (70 mg total) by mouth every 7 (seven) days. Take with a full glass of water on an empty stomach. 4 tablet 11   ALPRAZolam (XANAX) 0.5 MG tablet Take 0.5-1 tablets (0.25-0.5 mg total) by mouth 2 (two) times daily as needed for anxiety. 20 tablet 0   aspirin EC 81 MG tablet Take 1 tablet (81 mg total) by mouth daily.     atorvastatin (LIPITOR) 10 MG tablet Take 1 tablet (10 mg total) by mouth at bedtime. 90 tablet 4   Cholecalciferol (VITAMIN D) 2000 units tablet Take 2,000 Units by mouth daily.     doxylamine, Sleep, (UNISOM) 25 MG tablet Take 25 mg by mouth at bedtime.     escitalopram (LEXAPRO) 20 MG tablet Take 1 tablet (20 mg total) by mouth daily. 90 tablet 1   estradiol (ESTRACE) 0.1 MG/GM vaginal cream Place vaginally.     ezetimibe (ZETIA) 10 MG tablet Take 1 tablet (10 mg total) by mouth daily. 90 tablet 4   fenofibrate 160 MG tablet Take 1 tablet (160 mg total) by mouth at bedtime. 60 tablet 4   levothyroxine (SYNTHROID) 75 MCG tablet TAKE 1 TABLET BY MOUTH EVERY DAY BEFORE BREAKFAST 90 tablet 3   loperamide (IMODIUM A-D) 2 MG tablet Take 2 mg by mouth as needed.     lurasidone (LATUDA) 20 MG TABS tablet TAKE 1 TABLET DAILY WITH   SUPPER 90 tablet 3   meloxicam (MOBIC) 15 MG tablet Take 1 tablet (15 mg total) by mouth daily. 30 tablet 0   Multiple Vitamins-Minerals (MULTIVITAMIN WITH MINERALS) tablet Take 1 tablet by mouth daily.     OVER THE COUNTER MEDICATION Calcium supplement 600mg  with vitamin D 500 units- BID     pantoprazole (PROTONIX) 40 MG tablet Take 1 tablet (40 mg total) by mouth daily. 90 tablet 3   valACYclovir  (VALTREX) 500 MG  tablet TAKE 1 TABLET DAILY FOR    SUPPRESSION AND 1 TABLET   TWO TIMES A DAY FOR 3 DAYS FOR OUTBREAK Strength: 500 mg 90 tablet 0   calcium carbonate (OSCAL) 1500 (600 Ca) MG TABS tablet Take by mouth 2 (two) times daily with a meal.     famotidine (PEPCID) 20 MG tablet TAKE 1 TABLET AT BEDTIME (Patient taking differently: Twice a day) 90 tablet 3   Licorice, Glycyrrhiza glabra, (LICORICE ROOT PO) Take 1 Capful by mouth daily.     No facility-administered medications prior to visit.     EXAM:  BP 122/80 (BP Location: Left Arm, Patient Position: Sitting, Cuff Size: Normal)   Pulse 78   Temp 98.7 F (37.1 C) (Oral)   Ht 5\' 1"  (1.549 m)   Wt 139 lb 12.8 oz (63.4 kg)   LMP 12/09/2013   SpO2 97%   BMI 26.41 kg/m   Body mass index is 26.41 kg/m.  GENERAL: vitals reviewed and listed above, alert, oriented, appears well hydrated and in no acute distress HEENT: atraumatic, conjunctiva  clear, no obvious abnormalities on inspection of external nose and ears MS: moves all extremities without noticeable focal  abnormality PSYCH: pleasant and cooperative, no obvious depression or anxiety Lab Results  Component Value Date   WBC 7.1 04/23/2022   HGB 14.4 04/23/2022   HCT 42.7 04/23/2022   PLT 291.0 04/23/2022   GLUCOSE 100 (H) 05/08/2022   CHOL 151 04/23/2022   TRIG 99.0 04/23/2022   HDL 61.10 04/23/2022   LDLDIRECT 101.0 10/30/2016   LDLCALC 70 04/23/2022   ALT 30 04/23/2022   AST 22 04/23/2022   NA 139 05/08/2022   K 4.4 05/08/2022   CL 106 05/08/2022   CREATININE 0.90 05/08/2022   BUN 28 (H) 05/08/2022   CO2 24 05/08/2022   TSH 0.97 04/23/2022   HGBA1C 5.8 04/23/2022   BP Readings from Last 3 Encounters:  01/30/23 122/80  01/11/23 129/70  01/10/23 132/66    ASSESSMENT AND PLAN:  Discussed the following assessment and plan:  Hypercalcemia - Plan: Calcium, ionized, Vitamin D, 1,25-dihydroxy, PTH-related peptide, PTH, intact and calcium, Basic metabolic  panel, TSH, VITAMIN D 25 Hydroxy (Vit-D Deficiency, Fractures), VITAMIN D 25 Hydroxy (Vit-D Deficiency, Fractures), TSH, Basic metabolic panel, CANCELED: Basic metabolic panel, CANCELED: VITAMIN D 25 Hydroxy (Vit-D Deficiency, Fractures), CANCELED: TSH  Osteoporosis without current pathological fracture, unspecified osteoporosis type - Plan: Calcium, ionized, Vitamin D, 1,25-dihydroxy, PTH-related peptide, PTH, intact and calcium, Basic metabolic panel, TSH, VITAMIN D 25 Hydroxy (Vit-D Deficiency, Fractures), VITAMIN D 25 Hydroxy (Vit-D Deficiency, Fractures), TSH, Basic metabolic panel, CANCELED: Basic metabolic panel, CANCELED: VITAMIN D 25 Hydroxy (Vit-D Deficiency, Fractures), CANCELED: TSH  Hypothyroidism, unspecified type - Plan: TSH, TSH, CANCELED: TSH  History of esophagitis  Medication management Vit d level  pthrp 1-25 vit d  Ionized calcium  repeat pth  Diff dx  reviewed   make sure not vit d toxic  no sx  Is on fosamax and had esophageal dilatation and dx esophagitis  but not specifically attributed and doing well now  Will try to sort out best path after labs  -Patient advised to return or notify health care team  if  new concerns arise.  Patient Instructions  Good to see you today  Update labs.  May have you back off  vit d calcium depending  on results .   Will look through   record or any other advice .   Neta Mends.  Ottavio Norem M.D.

## 2023-01-31 LAB — PTH, INTACT AND CALCIUM: PTH: 12 pg/mL — ABNORMAL LOW (ref 16–77)

## 2023-01-31 LAB — BASIC METABOLIC PANEL
Calcium: 10.4 mg/dL (ref 8.6–10.4)
Sodium: 138 mmol/L (ref 135–146)

## 2023-02-04 LAB — BASIC METABOLIC PANEL
CO2: 23 mmol/L (ref 20–32)
Potassium: 4.3 mmol/L (ref 3.5–5.3)

## 2023-02-04 LAB — EXTRA SPECIMEN

## 2023-02-04 LAB — CALCIUM, IONIZED

## 2023-02-04 LAB — VITAMIN D 1,25 DIHYDROXY: Vitamin D 1, 25 (OH)2 Total: 28 pg/mL (ref 18–72)

## 2023-02-04 LAB — PTH, INTACT AND CALCIUM: Calcium: 10.4 mg/dL (ref 8.6–10.4)

## 2023-02-05 ENCOUNTER — Encounter: Payer: Self-pay | Admitting: Internal Medicine

## 2023-02-05 ENCOUNTER — Ambulatory Visit: Payer: BC Managed Care – PPO | Admitting: Nurse Practitioner

## 2023-02-05 LAB — PTH-RELATED PEPTIDE

## 2023-02-05 LAB — VITAMIN D 1,25 DIHYDROXY
Vitamin D2 1, 25 (OH)2: <8 pg/mL
Vitamin D3 1, 25 (OH)2: 28 pg/mL

## 2023-02-05 LAB — BASIC METABOLIC PANEL
BUN/Creatinine Ratio: 30 (calc) — ABNORMAL HIGH (ref 6–22)
BUN: 37 mg/dL — ABNORMAL HIGH (ref 7–25)
Chloride: 105 mmol/L (ref 98–110)
Creat: 1.23 mg/dL — ABNORMAL HIGH (ref 0.50–1.05)
Glucose, Bld: 102 mg/dL — ABNORMAL HIGH (ref 65–99)

## 2023-02-05 LAB — EXTRA SPECIMEN

## 2023-02-05 LAB — VITAMIN D 25 HYDROXY (VIT D DEFICIENCY, FRACTURES): Vit D, 25-Hydroxy: 38 ng/mL (ref 30–100)

## 2023-02-05 LAB — TSH: TSH: 2.33 mIU/L (ref 0.40–4.50)

## 2023-02-05 MED ORDER — PANTOPRAZOLE SODIUM 40 MG PO TBEC: 40.0000 mg | DELAYED_RELEASE_TABLET | Freq: Two times a day (BID) | ORAL | 1 refills | Status: DC

## 2023-02-06 MED ORDER — SCOPOLAMINE 1 MG/3DAYS TD PT72: 1.0000 | MEDICATED_PATCH | TRANSDERMAL | 0 refills | Status: DC

## 2023-02-06 NOTE — Telephone Encounter (Signed)
Yes   pth RP and Ionized calcium both should be done  as no results  . Sorry this happened  please  arrange this  lab to be done

## 2023-02-06 NOTE — Telephone Encounter (Signed)
So  I thought he lab would have contact her and [place]  to get the  lab orders completed.,.

## 2023-02-07 NOTE — Progress Notes (Signed)
Lisa Lee D.[facility] [address] [place] [address] Phone: [phone]   Assessment and Plan:     1. Chronic left shoulder pain  -Chronic with exacerbation, subsequent visit - Significant improvement in shoulder pain after completing course of meloxicam and starting HEP.  Still some discomfort and limited ROM with internal rotation, likely due to tight external rotators - Discontinue meloxicam and may use remainder as needed.  Recommend not using NSAIDs more than 1-2 times per week for chronic pain control - May use Tylenol for day-to-day pain relief - Continue HEP   pertinent previous records reviewed include none   Follow Up: As needed if no improvement or worsening of symptoms.  Could consider CSI versus physical therapy   Subjective:   I, Lisa Lee, am serving as a Neurosurgeon for Doctor [name]   Chief Complaint: left shoulder pain    HPI:    01/10/23 Patient is a 62 year old female complaining of left shoulder pain. Patient states pain and stiffness in the left shoulder that began in March. No MOI,  decreased ROM, pain radiates dwn to her thumb with numbness and tingling.  says she had been in the habit of carrying her heavy laptop case over her left shoulder until then. Since march she has switched to the other shoulder. She has applied ice and taken some Ibuprofen.  PCP told her it was her [facility] joint   02/08/2023 Patient states that she is a lot better . Still some decreased ROM and laying on it but not as much pain as when she came in     Relevant Historical Information: GERD, hypothyroidism  Additional pertinent review of systems negative.   Current Outpatient Medications:    albuterol (PROAIR HFA) 108 (90 Base) MCG/ACT inhaler, Inhale 2 puffs into the lungs every 6 (six) hours as needed for wheezing., Disp: 1 each, Rfl: 1   alendronate (FOSAMAX) 70 MG tablet, Take 1 tablet (70 mg total) by mouth every 7  (seven) days. Take with a full glass of water on an empty stomach., Disp: 4 tablet, Rfl: 11   ALPRAZolam (XANAX) 0.5 MG tablet, Take 0.5-1 tablets (0.25-0.5 mg total) by mouth 2 (two) times daily as needed for anxiety., Disp: 20 tablet, Rfl: 0   aspirin EC 81 MG tablet, Take 1 tablet (81 mg total) by mouth daily., Disp: , Rfl:    atorvastatin (LIPITOR) 10 MG tablet, Take 1 tablet (10 mg total) by mouth at bedtime., Disp: 90 tablet, Rfl: 4   Cholecalciferol (VITAMIN D) 2000 units tablet, Take 2,000 Units by mouth daily., Disp: , Rfl:    doxylamine, Sleep, (UNISOM) 25 MG tablet, Take 25 mg by mouth at bedtime., Disp: , Rfl:    escitalopram (LEXAPRO) 20 MG tablet, Take 1 tablet (20 mg total) by mouth daily., Disp: 90 tablet, Rfl: 1   estradiol (ESTRACE) 0.1 MG/GM vaginal cream, Place vaginally., Disp: , Rfl:    ezetimibe (ZETIA) 10 MG tablet, Take 1 tablet (10 mg total) by mouth daily., Disp: 90 tablet, Rfl: 4   fenofibrate 160 MG tablet, Take 1 tablet (160 mg total) by mouth at bedtime., Disp: 60 tablet, Rfl: 4   levothyroxine (SYNTHROID) 75 MCG tablet, TAKE 1 TABLET BY MOUTH EVERY DAY BEFORE BREAKFAST, Disp: 90 tablet, Rfl: 3   loperamide (IMODIUM A-D) 2 MG tablet, Take 2 mg by mouth as needed., Disp: , Rfl:    lurasidone (LATUDA) 20 MG TABS tablet, TAKE 1  TABLET DAILY WITH   SUPPER, Disp: 90 tablet, Rfl: 3   meloxicam (MOBIC) 15 MG tablet, Take 1 tablet (15 mg total) by mouth daily., Disp: 30 tablet, Rfl: 0   Multiple Vitamins-Minerals (MULTIVITAMIN WITH MINERALS) tablet, Take 1 tablet by mouth daily., Disp: , Rfl:    OVER THE COUNTER MEDICATION, Calcium supplement 600mg  with vitamin D 500 units- BID, Disp: , Rfl:    pantoprazole (PROTONIX) 40 MG tablet, Take 1 tablet (40 mg total) by mouth 2 (two) times daily before a meal., Disp: 60 tablet, Rfl: 1   scopolamine (TRANSDERM SCOP, 1.5 MG,) 1 MG/3DAYS, Place 1 patch (1.5 mg total) onto the skin every 3 (three) days. As needed for motion sickness,  Disp: 4 patch, Rfl: 0   valACYclovir (VALTREX) 500 MG tablet, TAKE 1 TABLET DAILY FOR    SUPPRESSION AND 1 TABLET   TWO TIMES A DAY FOR 3 DAYS FOR OUTBREAK Strength: 500 mg, Disp: 90 tablet, Rfl: 0   Objective:     Vitals:   02/08/23 0926  BP: 120/78  Pulse: 74  SpO2: 96%  Weight: 137 lb (62.1 kg)  Height: 5\' 1"  (1.549 m)      Body mass index is 25.89 kg/m.    Physical Exam:    Gen: Appears well, nad, nontoxic and pleasant Neuro:sensation intact, strength is 5/5 with df/pf/inv/ev, muscle tone wnl Skin: no suspicious lesion or defmority Psych: A&O, appropriate mood and affect  Left shoulder:  No deformity, swelling or muscle wasting No scapular winging FF 180, abd 180, int 10, ext 90 NTTP over the [place], clavicle, ac, coracoid, biceps groove, humerus, deltoid, trapezius, cervical spine Neg neer, hawkins, empty can, obriens, crossarm, subscap liftoff, speeds Neg ant drawer, sulcus sign, apprehension Negative Spurling's test bilat FROM of neck    Electronically signed by:  Lisa Lee D.[facility] 9:36 AM 02/08/23

## 2023-02-08 ENCOUNTER — Other Ambulatory Visit: Payer: BC Managed Care – PPO

## 2023-02-08 ENCOUNTER — Ambulatory Visit: Payer: BC Managed Care – PPO | Admitting: Sports Medicine

## 2023-02-08 VITALS — BP 120/78 | HR 74 | Ht 61.0 in | Wt 137.0 lb

## 2023-02-08 DIAGNOSIS — G8929 Other chronic pain: Secondary | ICD-10-CM | POA: Diagnosis not present

## 2023-02-08 DIAGNOSIS — M25512 Pain in left shoulder: Secondary | ICD-10-CM | POA: Diagnosis not present

## 2023-02-15 NOTE — Progress Notes (Signed)
So the ionized calcium is the actual  bioavailable  corrected level and is normal .   Reassuring  so no need for further work up at this time

## 2023-02-19 LAB — PTH-RELATED PEPTIDE: PTH-Related Protein (PTH-RP): 9 pg/mL — ABNORMAL LOW (ref 11–20)

## 2023-02-19 LAB — CALCIUM, IONIZED: Calcium, Ion: 5.3 mg/dL (ref 4.7–5.5)

## 2023-02-19 NOTE — Progress Notes (Signed)
Low pth rp ... this is favorable and no more labs needed at this time

## 2023-02-22 ENCOUNTER — Encounter: Payer: Self-pay | Admitting: Physician Assistant

## 2023-02-22 ENCOUNTER — Ambulatory Visit: Payer: BC Managed Care – PPO | Admitting: Physician Assistant

## 2023-02-22 DIAGNOSIS — F319 Bipolar disorder, unspecified: Secondary | ICD-10-CM

## 2023-02-22 DIAGNOSIS — F411 Generalized anxiety disorder: Secondary | ICD-10-CM | POA: Diagnosis not present

## 2023-02-22 DIAGNOSIS — G4700 Insomnia, unspecified: Secondary | ICD-10-CM

## 2023-02-22 DIAGNOSIS — G2401 Drug induced subacute dyskinesia: Secondary | ICD-10-CM | POA: Diagnosis not present

## 2023-02-22 MED ORDER — LURASIDONE HCL 20 MG PO TABS: ORAL_TABLET | ORAL | 3 refills | Status: DC

## 2023-02-22 MED ORDER — ALPRAZOLAM 0.5 MG PO TABS: 0.2500 mg | ORAL_TABLET | Freq: Two times a day (BID) | ORAL | 1 refills | Status: DC | PRN

## 2023-02-22 NOTE — Progress Notes (Unsigned)
[facility]  Patient ID: Lisa Lee,  MRN: [id number]  PCP: Madelin Headings, MD  Date of Evaluation: 02/22/2023 Time spent:20 minutes  Chief Complaint:  Chief Complaint   Anxiety; Depression; Follow-up    HISTORY/CURRENT STATUS: For routine med check.  Overall she's doing well. Has noticed that she moves her mandible side to side. And chatters her teeth uncontrollably. Has been going on for about a month. Her husband has mentioned it too. Seems worse when more stressed.   Patient is able to enjoy things.  Energy and motivation are good.  Work is going well. 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  She has 3 weeks before school starts again.  No extreme sadness, tearfulness, or feelings of hopelessness.  Sleeps well most of the time. ADLs and personal hygiene are normal.   Denies any changes in concentration, making decisions, or remembering things.  Appetite has not changed.  Weight is stable. Does get anxious at times, doesn't get panicky but if she didn't take the xanax, she can get PA. Denies suicidal or homicidal thoughts.  Patient denies increased energy with decreased need for sleep, increased talkativeness, racing thoughts, impulsivity or risky behaviors, increased spending, increased libido, grandiosity, increased irritability or anger, paranoia, or hallucinations.  Denies dizziness, syncope, seizures, numbness, tingling, tremor, tics, unsteady gait, slurred speech, confusion. No joint or muscle pain. Denies unexplained weight loss, frequent infections, or sores that heal slowly.  No polyphagia, polydipsia, or polyuria. Denies visual changes or paresthesias.   Individual Medical History/ Review of Systems: Changes? :Yes  had esophageal stricture dilated   Pulled a muscle in her back last week. Still recovering for that.   Past medications for mental health diagnoses include: Lexapro, lithium,  Xanax, Modafanil  Allergies: Penicillins, Pravastatin, and Rosuvastatin  Current Medications:  Current Outpatient Medications:    albuterol (PROAIR HFA) 108 (90 Base) MCG/ACT inhaler, Inhale 2 puffs into the lungs every 6 (six) hours as needed for wheezing., Disp: 1 each, Rfl: 1   alendronate (FOSAMAX) 70 MG tablet, Take 1 tablet (70 mg total) by mouth every 7 (seven) days. Take with a full glass of water on an empty stomach., Disp: 4 tablet, Rfl: 11   aspirin EC 81 MG tablet, Take 1 tablet (81 mg total) by mouth daily., Disp: , Rfl:    atorvastatin (LIPITOR) 10 MG tablet, Take 1 tablet (10 mg total) by mouth at bedtime., Disp: 90 tablet, Rfl: 4   Cholecalciferol (VITAMIN D) 2000 units tablet, Take 2,000 Units by mouth daily., Disp: , Rfl:    doxylamine, Sleep, (UNISOM) 25 MG tablet, Take 25 mg by mouth at bedtime., Disp: , Rfl:    escitalopram (LEXAPRO) 20 MG tablet, Take 1 tablet (20 mg total) by mouth daily., Disp: 90 tablet, Rfl: 1   estradiol (ESTRACE) 0.1 MG/GM vaginal cream, Place vaginally., Disp: , Rfl:    ezetimibe (ZETIA) 10 MG tablet, Take 1 tablet (10 mg total) by mouth daily., Disp: 90 tablet, Rfl: 4   fenofibrate 160 MG tablet, Take 1 tablet (160 mg total) by mouth at bedtime., Disp: 60 tablet, Rfl: 4   levothyroxine (SYNTHROID) 75 MCG tablet, TAKE 1 TABLET BY MOUTH EVERY DAY BEFORE BREAKFAST, Disp: 90 tablet, Rfl: 3   loperamide (IMODIUM A-D) 2 MG tablet, Take 2 mg by mouth as needed., Disp: , Rfl:    meloxicam (MOBIC) 15 MG tablet, Take 1 tablet (15 mg total) by  mouth daily., Disp: 30 tablet, Rfl: 0   Multiple Vitamins-Minerals (MULTIVITAMIN WITH MINERALS) tablet, Take 1 tablet by mouth daily., Disp: , Rfl:    OVER THE COUNTER MEDICATION, Calcium supplement 600mg  with vitamin D 500 units- BID, Disp: , Rfl:    pantoprazole (PROTONIX) 40 MG tablet, Take 1 tablet (40 mg total) by mouth 2 (two) times daily before a meal., Disp: 60 tablet, Rfl: 1   scopolamine (TRANSDERM SCOP, 1.5  MG,) 1 MG/3DAYS, Place 1 patch (1.5 mg total) onto the skin every 3 (three) days. As needed for motion sickness, Disp: 4 patch, Rfl: 0   valACYclovir (VALTREX) 500 MG tablet, TAKE 1 TABLET DAILY FOR    SUPPRESSION AND 1 TABLET   TWO TIMES A DAY FOR 3 DAYS FOR OUTBREAK Strength: 500 mg, Disp: 90 tablet, Rfl: 0   ALPRAZolam (XANAX) 0.5 MG tablet, Take 0.5-1 tablets (0.25-0.5 mg total) by mouth 2 (two) times daily as needed for anxiety., Disp: 60 tablet, Rfl: 1   lurasidone (LATUDA) 20 MG TABS tablet, TAKE 1 TABLET DAILY WITH   SUPPER, Disp: 90 tablet, Rfl: 3 Medication Side Effects: none  Family Medical/ Social History: Changes?  Her husband has atrial fibrillation and CHF.  MENTAL HEALTH EXAM:  Last menstrual period 12/09/2013.There is no height or weight on file to calculate BMI.  General Appearance: Casual and Well Groomed  Eye Contact:  Good  Speech:  Clear and Coherent and Normal Rate  Volume:  Normal  Mood:  Euthymic  Affect:  Congruent  Thought Process:  Goal Directed and Descriptions of Associations: Circumstantial  Orientation:  Full (Time, Place, and Person)  Thought Content: Logical   Suicidal Thoughts:  No  Homicidal Thoughts:  No  Memory:  WNL  Judgement:  Good  Insight:  Good  Psychomotor Activity:   Occas side to side motion of the mandible, movement of trunk  Concentration:  Concentration: Good and Attention Span: Good  Recall:  Good  Fund of Knowledge: Good  Language: Good  Assets:  Desire for Improvement Financial Resources/Insurance Housing Transportation Vocational/Educational  ADL's:  Intact  Cognition: WNL  Prognosis:  Good   DIAGNOSES:    ICD-10-CM   1. Bipolar I disorder (HCC)  F31.9     2. Generalized anxiety disorder  F41.1     3. Insomnia, unspecified type  G47.00     4. Tardive dyskinesia  G24.01       Receiving Psychotherapy: No   RECOMMENDATIONS:  PDMP was reviewed.  Last modafinil 07/27/2021.  Xanax filled 10/22/2022. I provided 20  minutes of face to face time during this encounter, including time spent before and after the visit in records review, medical decision making, counseling pertinent to today's visit, and charting.   Discussed TD. The truncal movements are new but she feels like those movements are d/t back pain. The mouth movements are mild, she doesn't want to go on [place] or [place] at this time, but is willing to readdress after she returns from a cruise in a few weeks. She'll increase the Xanax to see if that will help these movements before adding another med.   Continue Xanax 0.5 mg, 1/2-1 p.o. twice daily as needed. Continue Lexapro 20 mg, 1 p.o. every morning. Continue Latuda 20 mg, 1 po with evening meal, at least 350 cal.  Return in 4 weeks.  Melony Overly, PA-C

## 2023-02-23 ENCOUNTER — Encounter: Payer: Self-pay | Admitting: Physician Assistant

## 2023-03-06 ENCOUNTER — Other Ambulatory Visit: Payer: Self-pay | Admitting: Family

## 2023-03-06 ENCOUNTER — Other Ambulatory Visit (HOSPITAL_BASED_OUTPATIENT_CLINIC_OR_DEPARTMENT_OTHER): Payer: Self-pay | Admitting: *Deleted

## 2023-03-06 DIAGNOSIS — R072 Precordial pain: Secondary | ICD-10-CM

## 2023-03-06 DIAGNOSIS — E785 Hyperlipidemia, unspecified: Secondary | ICD-10-CM

## 2023-03-06 MED ORDER — FENOFIBRATE 160 MG PO TABS
160.0000 mg | ORAL_TABLET | Freq: Every day | ORAL | 2 refills | Status: DC
Start: 2023-03-06 — End: 2023-08-21

## 2023-03-09 ENCOUNTER — Encounter: Payer: Self-pay | Admitting: Internal Medicine

## 2023-03-11 ENCOUNTER — Encounter: Payer: Self-pay | Admitting: Internal Medicine

## 2023-03-11 NOTE — Telephone Encounter (Signed)
Scheduled a virtual visit with provider tomorrow at 8:30am.

## 2023-03-12 ENCOUNTER — Telehealth: Payer: BC Managed Care – PPO | Admitting: Internal Medicine

## 2023-03-12 ENCOUNTER — Other Ambulatory Visit: Payer: Self-pay

## 2023-03-12 ENCOUNTER — Encounter: Payer: Self-pay | Admitting: Internal Medicine

## 2023-03-12 VITALS — BP 136/88 | Temp 97.6°F | Ht 61.0 in | Wt 137.0 lb

## 2023-03-12 DIAGNOSIS — J988 Other specified respiratory disorders: Secondary | ICD-10-CM | POA: Diagnosis not present

## 2023-03-12 DIAGNOSIS — U071 COVID-19: Secondary | ICD-10-CM | POA: Diagnosis not present

## 2023-03-12 MED ORDER — VALACYCLOVIR HCL 500 MG PO TABS: ORAL_TABLET | ORAL | 3 refills | Status: DC

## 2023-03-12 NOTE — Progress Notes (Signed)
Virtual Visit via Video Note  I connected with Lisa Lee on 03/12/23 at  8:30 AM EDT by a video enabled telemedicine application and verified that I am speaking with the correct person using two identifiers. Location patient: home Location provider:work office Persons participating in the virtual visit: patient, provider    Patient aware  of the limitations of evaluation and management by telemedicine and  availability of in person appointments. and agreed to proceed.   HPI: Lisa Lee presents for video visit  Recent cruise  got back  this weekend and onset of sx 2 days ago st now Tired and coughing  an congestion and HA   sweating . NO asthma falare or serious local pain dyspnea .  ROS: See pertinent positives and negatives per HPI.  Past Medical History:  Diagnosis Date   Acid reflux    Asthma    Bipolar disorder (HCC)    Depression    Diarrhea 08/09/2011   Poss ibs  Vs other  Related to stress  consdier other eval if needed. Had colonoscoopy per dr Matthias Hughs    High cholesterol    History of hypertension    no meds for last year  per pt on 03-08-2022   HPV in female 1984   Hypothyroidism    PMB (postmenopausal bleeding)    Thyroid disease     Past Surgical History:  Procedure Laterality Date   CERVICAL CONIZATION W/BX N/A 07/21/2015   Procedure: CONIZATION CERVIX WITH BIOPSY;  Surgeon: Kirkland Hun, MD;  Location: [facility];  Service: Gynecology;  Laterality: N/A;   CERVICAL CONIZATION W/BX N/A 09/25/2018   Procedure: CONIZATION CERVIX WITH BIOPSY;  Surgeon: Adolphus Birchwood, MD;  Location: [facility];  Service: Gynecology;  Laterality: N/A;   CERVICAL CRYOTHERAPY  07/30/1982   colonscopy  07/04/2018   COLPOSCOPY  08/26/2013   DILATATION & CURETTAGE/HYSTEROSCOPY WITH MYOSURE N/A 03/16/2022   Procedure: ATTEMPTED DILATATION & CURETTAGE/ATTEMPTED HYSTEROSCOPY WITH MYOSURE, POLYPECTOMY;  Surgeon: Huel Cote, MD;  Location:  [facility];  Service: Gynecology;  Laterality: N/A;   DILATION AND CURETTAGE, DIAGNOSTIC / THERAPEUTIC  07/30/1992   Blighted Ovum   HYSTEROSCOPY     uterine polypectomy Jan 7th   HYSTEROSCOPY WITH RESECTOSCOPE  08/04/2009   Removed polyp & IUD   LYMPH NODE BIOPSY     40 yrs ago   OPERATIVE ULTRASOUND N/A 03/16/2022   Procedure: OPERATIVE ULTRASOUND;  Surgeon: Huel Cote, MD;  Location: [facility];  Service: Gynecology;  Laterality: N/A;   TONSILLECTOMY     as child   UPPER GASTROINTESTINAL ENDOSCOPY  01/11/2023   UPPER GI ENDOSCOPY  07/04/2018    Family History  Problem Relation Age of Onset   Arthritis Mother    Heart disease Mother    Rectal cancer Father    Heart disease Father    Colon cancer Father    Parkinson's disease Father    Heart disease Sister 67       cabg stent   Heart disease Sister    Thyroid disease Sister    Heart disease Brother    Pulmonary embolism Daughter    Thyroid disease Daughter    Esophageal cancer Neg Hx    Stomach cancer Neg Hx     Social History   Tobacco Use   Smoking status: Never   Smokeless tobacco: Never  Vaping Use   Vaping status: Never Used  Substance Use Topics   Alcohol use: Yes  Alcohol/week: 0.0 standard drinks of alcohol    Comment: monthly ocassional drink   Drug use: No      Current Outpatient Medications:    albuterol (PROAIR HFA) 108 (90 Base) MCG/ACT inhaler, Inhale 2 puffs into the lungs every 6 (six) hours as needed for wheezing., Disp: 1 each, Rfl: 1   alendronate (FOSAMAX) 70 MG tablet, Take 1 tablet (70 mg total) by mouth every 7 (seven) days. Take with a full glass of water on an empty stomach., Disp: 4 tablet, Rfl: 11   ALPRAZolam (XANAX) 0.5 MG tablet, Take 0.5-1 tablets (0.25-0.5 mg total) by mouth 2 (two) times daily as needed for anxiety., Disp: 60 tablet, Rfl: 1   aspirin EC 81 MG tablet, Take 1 tablet (81 mg total) by mouth daily., Disp: , Rfl:     atorvastatin (LIPITOR) 10 MG tablet, Take 1 tablet (10 mg total) by mouth at bedtime., Disp: 90 tablet, Rfl: 4   Cholecalciferol (VITAMIN D) 2000 units tablet, Take 2,000 Units by mouth daily., Disp: , Rfl:    doxylamine, Sleep, (UNISOM) 25 MG tablet, Take 25 mg by mouth at bedtime., Disp: , Rfl:    escitalopram (LEXAPRO) 20 MG tablet, Take 1 tablet (20 mg total) by mouth daily., Disp: 90 tablet, Rfl: 1   estradiol (ESTRACE) 0.1 MG/GM vaginal cream, Place vaginally., Disp: , Rfl:    ezetimibe (ZETIA) 10 MG tablet, Take 1 tablet (10 mg total) by mouth daily., Disp: 90 tablet, Rfl: 4   fenofibrate 160 MG tablet, Take 1 tablet (160 mg total) by mouth at bedtime., Disp: 60 tablet, Rfl: 2   levothyroxine (SYNTHROID) 75 MCG tablet, TAKE 1 TABLET BY MOUTH EVERY DAY BEFORE BREAKFAST, Disp: 90 tablet, Rfl: 3   loperamide (IMODIUM A-D) 2 MG tablet, Take 2 mg by mouth as needed., Disp: , Rfl:    lurasidone (LATUDA) 20 MG TABS tablet, TAKE 1 TABLET DAILY WITH   SUPPER, Disp: 90 tablet, Rfl: 3   Multiple Vitamins-Minerals (MULTIVITAMIN WITH MINERALS) tablet, Take 1 tablet by mouth daily., Disp: , Rfl:    OVER THE COUNTER MEDICATION, Calcium supplement 600mg  with vitamin D 500 units- BID, Disp: , Rfl:    pantoprazole (PROTONIX) 40 MG tablet, Take 1 tablet (40 mg total) by mouth 2 (two) times daily before a meal., Disp: 60 tablet, Rfl: 1   valACYclovir (VALTREX) 500 MG tablet, TAKE 1 TABLET DAILY FOR    SUPPRESSION AND 1 TABLET   TWO TIMES A DAY FOR 3 DAYS FOR OUTBREAK Strength: 500 mg, Disp: 90 tablet, Rfl: 0   meloxicam (MOBIC) 15 MG tablet, Take 1 tablet (15 mg total) by mouth daily. (Patient not taking: Reported on 03/12/2023), Disp: 30 tablet, Rfl: 0  EXAM: BP Readings from Last 3 Encounters:  03/12/23 136/88  02/08/23 120/78  01/30/23 122/80    VITALS per patient if applicable:  GENERAL: alert, oriented, appearsin no acute distress  congested   HEENT: atraumatic, conjunttiva clear, no obvious  abnormalities on inspection of external nose and ears Nasal congestion  NECK: normal movements of the head and neck  LUNGS: on inspection no signs of respiratory distress, breathing rate appears normal, no obvious gross SOB, gasping or wheezing  CV: no obvious cyanosis  MS: moves all visible extremities without noticeable abnormality  PSYCH/NEURO: pleasant and cooperative, no obvious depression or anxiety, speech and thought processing grossly intact   ASSESSMENT AND PLAN:  Discussed the following assessment and plan:    ICD-10-CM   1. Respiratory tract  infection due to COVID-19 virus  U07.1    J98.8       Counseled. To see k care for Alarm sx  hx of asthma not currently flare Disc risk benefit of paxlovid ( IA with latuda ) and hse has had 4 vaccines and prev infec 2022 Shared Decision Making. Continu8e with supportive care    cough med ok   Expectant management and discussion of plan and treatment with opportunity to ask questions and all were answered. The patient agreed with the plan and demonstrated an understanding of the instructions.   Advised to call back or seek an in-person evaluation if worsening  or having  further concerns  in interim. Return if symptoms worsen or fail to improve as expected.    Berniece Andreas, MD

## 2023-03-21 ENCOUNTER — Ambulatory Visit: Payer: BC Managed Care – PPO | Admitting: Physician Assistant

## 2023-04-18 ENCOUNTER — Other Ambulatory Visit: Payer: Self-pay | Admitting: Internal Medicine

## 2023-05-14 ENCOUNTER — Encounter: Payer: Self-pay | Admitting: Gastroenterology

## 2023-05-21 ENCOUNTER — Telehealth: Payer: Self-pay

## 2023-05-21 ENCOUNTER — Ambulatory Visit: Payer: BC Managed Care – PPO | Admitting: Physician Assistant

## 2023-05-21 ENCOUNTER — Encounter: Payer: Self-pay | Admitting: Physician Assistant

## 2023-05-21 DIAGNOSIS — F411 Generalized anxiety disorder: Secondary | ICD-10-CM

## 2023-05-21 DIAGNOSIS — G2401 Drug induced subacute dyskinesia: Secondary | ICD-10-CM | POA: Diagnosis not present

## 2023-05-21 DIAGNOSIS — G4700 Insomnia, unspecified: Secondary | ICD-10-CM | POA: Diagnosis not present

## 2023-05-21 DIAGNOSIS — F319 Bipolar disorder, unspecified: Secondary | ICD-10-CM

## 2023-05-21 MED ORDER — VALBENAZINE TOSYLATE 40 MG PO CAPS: 40.0000 mg | ORAL_CAPSULE | Freq: Every day | ORAL | 1 refills | Status: DC

## 2023-05-21 NOTE — Progress Notes (Signed)
[facility]  Patient ID: Lisa Lee,  MRN: [id number]  PCP: Madelin Headings, MD  Date of Evaluation: 05/21/2023 Time spent: 32 minutes  Chief Complaint:  Chief Complaint   Follow-up    HISTORY/CURRENT STATUS: For routine med check. Husband is with her.  Wants to discuss movement issues. We talked about it at the last visit.  She has movement of her mouth, involuntary motion of right side as if almost smiling, comes and goes quickly. Husband also notices movements in her legs at times. No tongue movements.   Doing well as far as mood goes. Patient is able to enjoy things.  Energy and motivation are good.  Work is going well.   No extreme sadness, tearfulness, or feelings of hopelessness.  Sleeps well most of the time. ADLs and personal hygiene are normal.   Denies any changes in concentration, making decisions, or remembering things.  Appetite has not changed.  Weight is stable.  Anxiety is still an issue, just feels overwhelmed, no PA.  Denies suicidal or homicidal thoughts.  Patient denies increased energy with decreased need for sleep, increased talkativeness, racing thoughts, impulsivity or risky behaviors, increased spending, increased libido, grandiosity, increased irritability or anger, paranoia, or hallucinations.  Denies dizziness, syncope, seizures, numbness, tingling, tremor, unsteady gait, slurred speech, confusion. No joint or muscle pain. Denies unexplained weight loss, frequent infections, or sores that heal slowly.  No polyphagia, polydipsia, or polyuria. Denies visual changes or paresthesias.   Individual Medical History/ Review of Systems: Changes? :Yes    Past medications for mental health diagnoses include: Lexapro, lithium, Xanax, Modafanil  Allergies: Penicillins, Pravastatin, and Rosuvastatin  Current Medications:  Current Outpatient Medications:    alendronate (FOSAMAX) 70 MG tablet, Take 1 tablet (70 mg total) by mouth every 7  (seven) days. Take with a full glass of water on an empty stomach., Disp: 4 tablet, Rfl: 11   ALPRAZolam (XANAX) 0.5 MG tablet, Take 0.5-1 tablets (0.25-0.5 mg total) by mouth 2 (two) times daily as needed for anxiety., Disp: 60 tablet, Rfl: 1   aspirin EC 81 MG tablet, Take 1 tablet (81 mg total) by mouth daily., Disp: , Rfl:    atorvastatin (LIPITOR) 10 MG tablet, Take 1 tablet (10 mg total) by mouth at bedtime., Disp: 90 tablet, Rfl: 4   Cholecalciferol (VITAMIN D) 2000 units tablet, Take 2,000 Units by mouth daily., Disp: , Rfl:    doxylamine, Sleep, (UNISOM) 25 MG tablet, Take 25 mg by mouth at bedtime., Disp: , Rfl:    escitalopram (LEXAPRO) 20 MG tablet, Take 1 tablet (20 mg total) by mouth daily., Disp: 90 tablet, Rfl: 1   estradiol (ESTRACE) 0.1 MG/GM vaginal cream, Place vaginally., Disp: , Rfl:    ezetimibe (ZETIA) 10 MG tablet, Take 1 tablet (10 mg total) by mouth daily., Disp: 90 tablet, Rfl: 4   fenofibrate 160 MG tablet, Take 1 tablet (160 mg total) by mouth at bedtime., Disp: 60 tablet, Rfl: 2   levothyroxine (SYNTHROID) 75 MCG tablet, TAKE 1 TABLET BY MOUTH EVERY DAY BEFORE breakfast, Disp: 90 tablet, Rfl: 3   loperamide (IMODIUM A-D) 2 MG tablet, Take 2 mg by mouth as needed., Disp: , Rfl:    lurasidone (LATUDA) 20 MG TABS tablet, TAKE 1 TABLET DAILY WITH   SUPPER, Disp: 90 tablet, Rfl: 3   Multiple Vitamins-Minerals (MULTIVITAMIN WITH MINERALS) tablet, Take 1 tablet by mouth daily., Disp: , Rfl:    OVER THE COUNTER MEDICATION, Calcium supplement 600mg  with vitamin  D 500 units- BID, Disp: , Rfl:    pantoprazole (PROTONIX) 40 MG tablet, Take 1 tablet (40 mg total) by mouth 2 (two) times daily before a meal., Disp: 60 tablet, Rfl: 1   valACYclovir (VALTREX) 500 MG tablet, TAKE 1 TABLET DAILY FOR    SUPPRESSION AND 1 TABLET   TWO TIMES A DAY FOR 3 DAYS FOR OUTBREAK Strength: 500 mg, Disp: 90 tablet, Rfl: 3   valbenazine (INGREZZA) 40 MG capsule, Take 1 capsule (40 mg total) by mouth  daily., Disp: 30 capsule, Rfl: 1   albuterol (PROAIR HFA) 108 (90 Base) MCG/ACT inhaler, Inhale 2 puffs into the lungs every 6 (six) hours as needed for wheezing. (Patient not taking: Reported on 05/21/2023), Disp: 1 each, Rfl: 1   meloxicam (MOBIC) 15 MG tablet, Take 1 tablet (15 mg total) by mouth daily. (Patient not taking: Reported on 03/12/2023), Disp: 30 tablet, Rfl: 0 Medication Side Effects: none  Family Medical/ Social History: Changes?  none  MENTAL HEALTH EXAM:  Last menstrual period 12/09/2013.There is no height or weight on file to calculate BMI.  General Appearance: Casual and Well Groomed  Eye Contact:  Good  Speech:  Clear and Coherent and Normal Rate  Volume:  Normal  Mood:  Euthymic  Affect:  Congruent  Thought Process:  Goal Directed and Descriptions of Associations: Circumstantial  Orientation:  Full (Time, Place, and Person)  Thought Content: Logical   Suicidal Thoughts:  No  Homicidal Thoughts:  No  Memory:  WNL  Judgement:  Good  Insight:  Good  Psychomotor Activity:   frequent contraction on facial muscles around the mouth as if starting to smile, moves hips occas   Concentration:  Concentration: Good and Attention Span: Good  Recall:  Good  Fund of Knowledge: Good  Language: Good  Assets:  Desire for Improvement Financial Resources/Insurance Housing Transportation Vocational/Educational  ADL's:  Intact  Cognition: WNL  Prognosis:  Good   AIMS    Flowsheet Row Office Visit from 05/21/2023 in [facility]  AIMS Total Score 17      PHQ2-9    Flowsheet Row Office Visit from 04/23/2022 in [facility] [place]  PHQ-2 Total Score 0  PHQ-9 Total Score 2      Flowsheet Row Admission (Discharged) from 03/16/2022 in [facility]  C-SSRS RISK CATEGORY No Risk      DIAGNOSES:    ICD-10-CM   1. Bipolar I disorder (HCC)  F31.9     2. Tardive dyskinesia  G24.01     3. Generalized anxiety disorder   F41.1     4. Insomnia, unspecified type  G47.00       Receiving Psychotherapy: No   RECOMMENDATIONS:  PDMP was reviewed.  Last Xanax 02/22/2023. I provided 32 minutes of face to face time during this encounter, including time spent before and after the visit in records review, medical decision making, counseling pertinent to today's visit, and charting.   Long discussion about tardive dyskinesia.  [name] has done really well on the current medication regimen and doesn't want to change treatments if at all possible. I agree that we shouldn't change her meds, especially since any AAP may cause TD. Treatment options of Ingrezza or Austedo.  Recommend starting Ingrezza.  Benefits, risks, and SE discussed and she and husband would like for her to try it.   Continue Xanax 0.5 mg, 1/2-1 p.o. twice daily as needed. Continue Lexapro 20 mg, 1 p.o. every morning. Continue Latuda  20 mg, 1 po with evening meal, at least 350 cal.  Start Ingrezza 40 mg, 1 every day. 2 weeks of samples given.  Return in 4 weeks.  Melony Overly, PA-C

## 2023-05-21 NOTE — Telephone Encounter (Signed)
Prior Authorization for Ingrezza 40 mg initiated from [employer], questions answered and submitted to [employer]

## 2023-05-24 NOTE — Telephone Encounter (Signed)
Prior Approval received for Ingrezza effective 05/22/2023-11/20/2023 with [facility].

## 2023-05-27 NOTE — Telephone Encounter (Signed)
noted 

## 2023-05-30 ENCOUNTER — Telehealth: Payer: Self-pay | Admitting: Physician Assistant

## 2023-05-30 NOTE — Telephone Encounter (Signed)
Pt called in and LM at 3:30. She will need [place] to send her Ingrezza RX to [employer]. She has to use [facility] due to her insurance and [place] cannot fill it. [facility]?  Fax # [phone]

## 2023-05-31 ENCOUNTER — Other Ambulatory Visit: Payer: Self-pay

## 2023-05-31 MED ORDER — VALBENAZINE TOSYLATE 40 MG PO CAPS: 40.0000 mg | ORAL_CAPSULE | Freq: Every day | ORAL | 0 refills | Status: DC

## 2023-05-31 NOTE — Telephone Encounter (Signed)
pended

## 2023-05-31 NOTE — Telephone Encounter (Signed)
Great, thanks

## 2023-06-04 ENCOUNTER — Telehealth: Payer: Self-pay | Admitting: Physician Assistant

## 2023-06-04 NOTE — Telephone Encounter (Signed)
Pt is asking for samples of Ingrezza until she can get her RF from pharmacy.

## 2023-06-04 NOTE — Telephone Encounter (Signed)
2 week supply of samples are in sample closet for her. Admin staff knows.

## 2023-06-04 NOTE — Telephone Encounter (Signed)
She states that she starting to see some benefits. She was feeling sleepy but started taking it at night (10/24), and has experienced some anxiety, uncertain if it is related or not.  Insurance will only approve it for [facility]. They have not filled it yet and would like some samples until she can get rf  Start Ingrezza 40 mg, 1 every day. 2 weeks of samples given.  Return in 4 weeks.

## 2023-06-05 ENCOUNTER — Other Ambulatory Visit: Payer: Self-pay | Admitting: Family

## 2023-06-14 ENCOUNTER — Other Ambulatory Visit: Payer: Self-pay | Admitting: Physician Assistant

## 2023-06-21 ENCOUNTER — Encounter: Payer: Self-pay | Admitting: Physician Assistant

## 2023-06-21 ENCOUNTER — Ambulatory Visit: Payer: BC Managed Care – PPO | Admitting: Physician Assistant

## 2023-06-21 DIAGNOSIS — F319 Bipolar disorder, unspecified: Secondary | ICD-10-CM | POA: Diagnosis not present

## 2023-06-21 DIAGNOSIS — G2401 Drug induced subacute dyskinesia: Secondary | ICD-10-CM | POA: Diagnosis not present

## 2023-06-21 DIAGNOSIS — G2571 Drug induced akathisia: Secondary | ICD-10-CM

## 2023-06-21 DIAGNOSIS — F411 Generalized anxiety disorder: Secondary | ICD-10-CM

## 2023-06-21 MED ORDER — ESCITALOPRAM OXALATE 20 MG PO TABS: 20.0000 mg | ORAL_TABLET | Freq: Every day | ORAL | 1 refills | Status: DC

## 2023-06-21 MED ORDER — ALPRAZOLAM 0.5 MG PO TABS: 0.2500 mg | ORAL_TABLET | Freq: Two times a day (BID) | ORAL | 1 refills | Status: DC | PRN

## 2023-06-21 NOTE — Progress Notes (Unsigned)
[facility]  Patient ID: Lisa Lee,  MRN: [id number]  PCP: Madelin Headings, MD  Date of Evaluation: 06/21/2023 Time spent:35 minutes  Chief Complaint:  Chief Complaint   Follow-up    HISTORY/CURRENT STATUS: For routine med check.   She stopped the Ingrezza a few days ago. It caused drowsiness, so she started taking it at night. Then it interupted her sleep. Then it caused depression. She couldn't handle the side effects anymore.  Now she feels jittery inside, has a hard time relaxing, is having movements in her trunk, almost like she is rocking.  Still has mouth movements, she is not biting the inside of her cheek but more of smiling on the right side only.  Feels anxious b/c of all this.   Patient is able to enjoy things.  Energy and motivation are good.  Work is ok.   No extreme sadness, tearfulness, or feelings of hopelessness.  She has not been sleeping well because of the abnormal movements she thinks.  ADLs and personal hygiene are normal.   Denies any changes in concentration, making decisions, or remembering things.  Appetite has not changed.  Weight is stable.  Denies suicidal or homicidal thoughts.  Patient denies increased energy with decreased need for sleep, increased talkativeness, racing thoughts, impulsivity or risky behaviors, increased spending, increased libido, grandiosity, increased irritability or anger, paranoia, or hallucinations.  Review of Systems  Constitutional: Negative.   HENT: Negative.    Eyes: Negative.   Respiratory: Negative.    Cardiovascular: Negative.   Gastrointestinal: Negative.   Genitourinary: Negative.   Musculoskeletal: Negative.   Skin: Negative.   Neurological:        See HPI  Endo/Heme/Allergies: Negative.   Psychiatric/Behavioral:         See HPI   Individual Medical History/ Review of Systems: Changes? :No    Past medications for mental health diagnoses include: Lexapro, lithium, Xanax, Modafanil,  Ingrezza caused depression and sedation  Allergies: Penicillins, Pravastatin, and Rosuvastatin  Current Medications:  Current Outpatient Medications:    albuterol (PROAIR HFA) 108 (90 Base) MCG/ACT inhaler, Inhale 2 puffs into the lungs every 6 (six) hours as needed for wheezing., Disp: 1 each, Rfl: 1   alendronate (FOSAMAX) 70 MG tablet, Take 1 tablet (70 mg total) by mouth every 7 (seven) days. Take with a full glass of water on an empty stomach., Disp: 4 tablet, Rfl: 11   aspirin EC 81 MG tablet, Take 1 tablet (81 mg total) by mouth daily., Disp: , Rfl:    atorvastatin (LIPITOR) 10 MG tablet, Take 1 tablet (10 mg total) by mouth at bedtime., Disp: 90 tablet, Rfl: 4   Cholecalciferol (VITAMIN D) 2000 units tablet, Take 2,000 Units by mouth daily., Disp: , Rfl:    doxylamine, Sleep, (UNISOM) 25 MG tablet, Take 25 mg by mouth at bedtime., Disp: , Rfl:    estradiol (ESTRACE) 0.1 MG/GM vaginal cream, Place vaginally., Disp: , Rfl:    ezetimibe (ZETIA) 10 MG tablet, Take 1 tablet (10 mg total) by mouth daily., Disp: 90 tablet, Rfl: 4   fenofibrate 160 MG tablet, Take 1 tablet (160 mg total) by mouth at bedtime., Disp: 60 tablet, Rfl: 2   levothyroxine (SYNTHROID) 75 MCG tablet, TAKE 1 TABLET BY MOUTH EVERY DAY BEFORE breakfast, Disp: 90 tablet, Rfl: 3   loperamide (IMODIUM A-D) 2 MG tablet, Take 2 mg by mouth as needed., Disp: , Rfl:    lurasidone (LATUDA) 20 MG TABS tablet, TAKE 1  TABLET DAILY WITH   SUPPER, Disp: 90 tablet, Rfl: 3   Multiple Vitamins-Minerals (MULTIVITAMIN WITH MINERALS) tablet, Take 1 tablet by mouth daily., Disp: , Rfl:    OVER THE COUNTER MEDICATION, Calcium supplement 600mg  with vitamin D 500 units- BID, Disp: , Rfl:    pantoprazole (PROTONIX) 40 MG tablet, Take 1 tablet (40 mg total) by mouth 2 (two) times daily before a meal., Disp: 60 tablet, Rfl: 1   valACYclovir (VALTREX) 500 MG tablet, TAKE 1 TABLET DAILY FOR    SUPPRESSION AND 1 TABLET   TWO TIMES A DAY FOR 3 DAYS FOR  OUTBREAK Strength: 500 mg, Disp: 90 tablet, Rfl: 3   ALPRAZolam (XANAX) 0.5 MG tablet, Take 0.5-1 tablets (0.25-0.5 mg total) by mouth 2 (two) times daily as needed for anxiety., Disp: 60 tablet, Rfl: 1   escitalopram (LEXAPRO) 20 MG tablet, Take 1 tablet (20 mg total) by mouth daily., Disp: 90 tablet, Rfl: 1   meloxicam (MOBIC) 15 MG tablet, Take 1 tablet (15 mg total) by mouth daily. (Patient not taking: Reported on 03/12/2023), Disp: 30 tablet, Rfl: 0 Medication Side Effects: none  Family Medical/ Social History: Changes?  none  MENTAL HEALTH EXAM:  Last menstrual period 12/09/2013.There is no height or weight on file to calculate BMI.  General Appearance: Casual and Well Groomed  Eye Contact:  Good  Speech:  Clear and Coherent and Normal Rate  Volume:  Normal  Mood:  Anxious  Affect:  Anxious  Thought Process:  Goal Directed and Descriptions of Associations: Circumstantial  Orientation:  Full (Time, Place, and Person)  Thought Content: Logical   Suicidal Thoughts:  No  Homicidal Thoughts:  No  Memory:  WNL  Judgement:  Good  Insight:  Good  Psychomotor Activity:   Abnormal truncal movements with contraction of right sided facial muscles  Concentration:  Concentration: Good and Attention Span: Good  Recall:  Good  Fund of Knowledge: Good  Language: Good  Assets:  Desire for Improvement Financial Resources/Insurance Housing Transportation Vocational/Educational  ADL's:  Intact  Cognition: WNL  Prognosis:  Good   AIMS    Flowsheet Row Office Visit from 06/21/2023 in [facility] Office Visit from 05/21/2023 in [facility]  AIMS Total Score 18 17      PHQ2-9    Flowsheet Row Office Visit from 04/23/2022 in [facility] [place]  PHQ-2 Total Score 0  PHQ-9 Total Score 2      Flowsheet Row Admission (Discharged) from 03/16/2022 in [facility]  C-SSRS RISK CATEGORY No Risk       DIAGNOSES:    ICD-10-CM   1. Tardive dyskinesia  G24.01     2. Bipolar I disorder (HCC)  F31.9     3. Generalized anxiety disorder  F41.1     4. Akathisia  G25.71      Receiving Psychotherapy: No   RECOMMENDATIONS:  PDMP was reviewed.  Last Xanax filled 02/22/2023. I provided 35 minutes of face to face time during this encounter, including time spent before and after the visit in records review, medical decision making, counseling pertinent to today's visit, and charting.   We discussed her symptoms.  I recommended Austedo but she is scared to take that because the side effects to Ingrezza were so severe.  It seems that she has 2 separate movement disorders, the tardive dyskinesia and akathisia as well.  We could have her go off Latuda but neither of [place] really want  her to do that because it has helped with her mood much better than any other medication.  For now we will continue that.  We also discussed adding mirtazapine to help the akathisia symptoms.  She prefers not to do that at this time.  She will try the Xanax for those symptoms when needed.  Continue Xanax 0.5 mg, 1/2-1 p.o. twice daily as needed. Continue Lexapro 20 mg, 1 p.o. every morning. Continue Latuda 20 mg, 1 po with evening meal, at least 350 cal.  Return in 6 weeks.  Melony Overly, PA-C

## 2023-06-24 ENCOUNTER — Telehealth: Payer: Self-pay | Admitting: Physician Assistant

## 2023-06-24 ENCOUNTER — Encounter: Payer: Self-pay | Admitting: Physician Assistant

## 2023-06-24 ENCOUNTER — Other Ambulatory Visit: Payer: Self-pay

## 2023-06-24 MED ORDER — MIRTAZAPINE 15 MG PO TABS: ORAL_TABLET | ORAL | 0 refills | Status: DC

## 2023-06-24 NOTE — Telephone Encounter (Signed)
Yes, please pend Mirtazapine 15 mg, #30, 1/2-1 at bedtime prn. I'd take it regularly for 3-4 nights and if she feels better, go to prn. It's a generic so we don't have samples.

## 2023-06-24 NOTE — Telephone Encounter (Signed)
Pt notified and medication pended

## 2023-06-24 NOTE — Telephone Encounter (Signed)
[name] called and LM at 9:15 to report that she would like to try a trial of Remeron as discussed at the last appt.  Her Body movements have become almost consistent.  She would like to try over the Thanksgiving holiday.  If we have samples she can come get them.  Please let her know if we have samples or if you are going to call in a prescription.

## 2023-07-07 ENCOUNTER — Encounter: Payer: Self-pay | Admitting: Internal Medicine

## 2023-07-08 ENCOUNTER — Encounter: Payer: Self-pay | Admitting: Gastroenterology

## 2023-07-08 ENCOUNTER — Ambulatory Visit (AMBULATORY_SURGERY_CENTER): Payer: BC Managed Care – PPO | Admitting: *Deleted

## 2023-07-08 ENCOUNTER — Other Ambulatory Visit: Payer: Self-pay | Admitting: Gastroenterology

## 2023-07-08 VITALS — Ht 61.0 in | Wt 137.0 lb

## 2023-07-08 DIAGNOSIS — Z860100 Personal history of colon polyps, unspecified: Secondary | ICD-10-CM

## 2023-07-08 MED ORDER — ONDANSETRON HCL 4 MG PO TABS: 4.0000 mg | ORAL_TABLET | Freq: Three times a day (TID) | ORAL | 0 refills | Status: DC | PRN

## 2023-07-08 NOTE — Progress Notes (Signed)
Pre  visit completed over telephone. Instructions mailed and sent through mychart. Patient did not tolerate SUPREP last time, severe nausea and vomiting.  Changed to Miralax and sent in rx for Zofran.    No egg or soy allergy known to patient  No issues known to pt with past sedation with any surgeries or procedures Patient denies ever being told they had issues or difficulty with intubation  No FH of Malignant Hyperthermia Pt is not on diet pills Pt is not on  home 02  Pt is not on blood thinners  Pt denies issues with constipation  No A fib or A flutter Have any cardiac testing pending--NO Pt instructed to use [email or web address] or GoodRx for a price reduction on prep

## 2023-07-09 ENCOUNTER — Encounter: Payer: Self-pay | Admitting: Internal Medicine

## 2023-07-09 ENCOUNTER — Ambulatory Visit: Payer: BC Managed Care – PPO | Admitting: Internal Medicine

## 2023-07-09 VITALS — BP 130/70 | HR 70 | Temp 98.3°F | Ht 61.0 in | Wt 137.8 lb

## 2023-07-09 DIAGNOSIS — Z79899 Other long term (current) drug therapy: Secondary | ICD-10-CM

## 2023-07-09 DIAGNOSIS — R259 Unspecified abnormal involuntary movements: Secondary | ICD-10-CM

## 2023-07-09 MED ORDER — ALENDRONATE SODIUM 70 MG PO TABS: 70.0000 mg | ORAL_TABLET | ORAL | 3 refills | Status: DC

## 2023-07-09 MED ORDER — ALBUTEROL SULFATE HFA 108 (90 BASE) MCG/ACT IN AERS: 2.0000 | INHALATION_SPRAY | Freq: Four times a day (QID) | RESPIRATORY_TRACT | 1 refills | Status: AC | PRN

## 2023-07-09 NOTE — Progress Notes (Signed)
Chief Complaint  Patient presents with   Body Jerk    Pt reports repetitive body jerk going on for over  2 wks. Pt states it is from ingrezza, Rx by psychiatrist. Pt also reports it makes her feel depressed. Stop taking on11/19. Pt reports feeling depressed went away but still having body jerking. Has trouble with typing and some hand movements like when making scramble eggs. Pt reports sx on her body trunk and legs. She continues that she sleep fine at night.     HPI: Lisa Lee 62 y.o. come in     involuntary   movements that are newer over the last weeks after she started interest about 4 weeks where her oral involuntary movements resolved but then she began having truncal involuntary contraction and leg movements when she was sitting quiet.  Not associated with sleep and not affecting her walking or intention. Her nurse practitioner psychiatric nurse practitioner had her stop the medication to try Remeron as needed and Xanax in the morning it is about the same. Remeron as  needed caused drowsiness and no other help? Her leg and trunk movements do not occur when she is sleeping. The Latuda which has been quite helpful for her suggested to DC but she is somewhat hesitant to stop.  She plans on trying to decrease to 3 times a week and then stop.  Over the last 3 weeks he is truncal like involuntary movements have not abated despite changing medicine.  Fortunately her oral movements are still resolved.  Asked advice about next step question psychiatric medicine consult or other help.    Has upcoming routine colonoscopy and CPE at the end of the month.   ROS: See pertinent positives and negatives per HPI. No resp sx or effect on driving and hand writing other intentional tasks   Past Medical History:  Diagnosis Date   Acid reflux    Asthma    Bipolar disorder (HCC)    Depression    Diarrhea 08/09/2011   Poss ibs  Vs other  Related to stress  consdier other eval if  needed. Had colonoscoopy per dr Matthias Hughs    High cholesterol    History of hypertension    no meds for last year  per pt on 03-08-2022   HPV in female 1984   Hypothyroidism    PMB (postmenopausal bleeding)    Thyroid disease     Family History  Problem Relation Age of Onset   Arthritis Mother    Heart disease Mother    Rectal cancer Father    Heart disease Father    Colon cancer Father    Parkinson's disease Father    Heart disease Sister 80       cabg stent   Heart disease Sister    Thyroid disease Sister    Heart disease Brother    Pulmonary embolism Daughter    Thyroid disease Daughter    Esophageal cancer Neg Hx    Stomach cancer Neg Hx     Social History   Socioeconomic History   Marital status: Married    Spouse name: Not on file   Number of children: Not on file   Years of education: Not on file   Highest education level: Bachelor's degree (e.g., BA, AB, BS)  Occupational History   Not on file  Tobacco Use   Smoking status: Never   Smokeless tobacco: Never  Vaping Use   Vaping status: Never Used  Substance and Sexual  Activity   Alcohol use: Yes    Alcohol/week: 0.0 standard drinks of alcohol    Comment: monthly ocassional drink   Drug use: No   Sexual activity: Yes    Birth control/protection: None  Other Topics Concern   Not on file  Social History Narrative    [employer]. 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    Divorced   Regular exercise-  Not as much recently    [facility] of 2   Pets 2 cats 1 dog to move   Daughter  On recovery heroin   Social Determinants of Health   Financial Resource Strain: Low Risk  (01/07/2023)   Overall Financial Resource Strain (CARDIA)    Difficulty of Paying Living Expenses: Not hard at all  Food Insecurity: No Food Insecurity (01/07/2023)   Hunger Vital Sign    Worried About Running Out of Food in the Last Year: Never true    Ran Out of Food in the Last Year: Never true   Transportation Needs: No Transportation Needs (01/07/2023)   PRAPARE - Administrator, Civil Service (Medical): No    Lack of Transportation (Non-Medical): No  Physical Activity: Unknown (01/07/2023)   Exercise Vital Sign    Days of Exercise per Week: 0 days    Minutes of Exercise per Session: Not on file  Stress: Stress Concern Present (01/07/2023)   [employer] of Occupational Health - Occupational Stress Questionnaire    Feeling of Stress : To some extent  Social Connections: Moderately Isolated (01/07/2023)   Social Connection and Isolation Panel [NHANES]    Frequency of Communication with Friends and Family: More than three times a week    Frequency of Social Gatherings with Friends and Family: Once a week    Attends Religious Services: Never    Database administrator or Organizations: No    Attends Engineer, structural: Not on file    Marital Status: Married    Outpatient Medications Prior to Visit  Medication Sig Dispense Refill   ALPRAZolam (XANAX) 0.5 MG tablet Take 0.5-1 tablets (0.25-0.5 mg total) by mouth 2 (two) times daily as needed for anxiety. 60 tablet 1   aspirin EC 81 MG tablet Take 1 tablet (81 mg total) by mouth daily.     atorvastatin (LIPITOR) 10 MG tablet Take 1 tablet (10 mg total) by mouth at bedtime. 90 tablet 4   Cholecalciferol (VITAMIN D) 2000 units tablet Take 2,000 Units by mouth daily.     doxylamine, Sleep, (UNISOM) 25 MG tablet Take 25 mg by mouth at bedtime.     escitalopram (LEXAPRO) 20 MG tablet Take 1 tablet (20 mg total) by mouth daily. 90 tablet 1   estradiol (ESTRACE) 0.1 MG/GM vaginal cream Place vaginally.     ezetimibe (ZETIA) 10 MG tablet Take 1 tablet (10 mg total) by mouth daily. 90 tablet 4   fenofibrate 160 MG tablet Take 1 tablet (160 mg total) by mouth at bedtime. 60 tablet 2   levothyroxine (SYNTHROID) 75 MCG tablet TAKE 1 TABLET BY MOUTH EVERY DAY BEFORE breakfast 90 tablet 3   loperamide (IMODIUM A-D) 2 MG  tablet Take 2 mg by mouth as needed.     lurasidone (LATUDA) 20 MG TABS tablet TAKE 1 TABLET DAILY WITH   SUPPER 90 tablet 3   Multiple Vitamins-Minerals (MULTIVITAMIN WITH MINERALS) tablet Take 1 tablet by mouth daily.     OVER THE COUNTER MEDICATION  Calcium supplement 600mg  with vitamin D 500 units- BID     pantoprazole (PROTONIX) 40 MG tablet Take 1 tablet (40 mg total) by mouth 2 (two) times daily before a meal. 60 tablet 1   Polyethylene Glycol 3350 (MIRALAX PO) Take 238 g by mouth once.     valACYclovir (VALTREX) 500 MG tablet TAKE 1 TABLET DAILY FOR    SUPPRESSION AND 1 TABLET   TWO TIMES A DAY FOR 3 DAYS FOR OUTBREAK Strength: 500 mg 90 tablet 3   albuterol (PROAIR HFA) 108 (90 Base) MCG/ACT inhaler Inhale 2 puffs into the lungs every 6 (six) hours as needed for wheezing. 1 each 1   alendronate (FOSAMAX) 70 MG tablet Take 1 tablet (70 mg total) by mouth every 7 (seven) days. Take with a full glass of water on an empty stomach. 4 tablet 11   mirtazapine (REMERON) 15 MG tablet Take 1/2-1 TABLET at bedtime PRN (Patient not taking: Reported on 07/08/2023) 30 tablet 0   ondansetron (ZOFRAN) 4 MG tablet Take 1 tablet (4 mg total) by mouth every 8 (eight) hours as needed for nausea or vomiting. (Patient not taking: Reported on 07/09/2023) 4 tablet 0   meloxicam (MOBIC) 15 MG tablet Take 1 tablet (15 mg total) by mouth daily. (Patient not taking: Reported on 03/12/2023) 30 tablet 0   No facility-administered medications prior to visit.     EXAM:  BP 130/70 (BP Location: Left Arm, Patient Position: Sitting, Cuff Size: Large)   Pulse 70   Temp 98.3 F (36.8 C) (Oral)   Ht 5\' 1"  (1.549 m)   Wt 137 lb 12.8 oz (62.5 kg)   LMP 12/09/2013   SpO2 96%   BMI 26.04 kg/m   Body mass index is 26.04 kg/m.  GENERAL: vitals reviewed and listed above, alert, oriented, appears well hydrated and in no acute distress when sitting  has involuntary upper leg movements and  abdominal truncal muslce  contractions   speech  nl and nl facial expressions  HEENT: atraumatic, conjunctiva  clear, no obvious abnormalities on inspection of external nose and ears  NECK: no obvious masses on inspection palpation  MS: moves all extremities without noticeable focal  abnormality PSYCH: pleasant and cooperative, no obvious depression or anxiety   Neuro no fasciculations   walking not noteable    neg cogwheeling  no cogwheeling   BP Readings from Last 3 Encounters:  07/09/23 130/70  03/12/23 136/88  02/08/23 120/78    ASSESSMENT AND PLAN:  Discussed the following assessment and plan:  Involuntary movements  Medication management So oral movements were better on 4 weeks of intrezza but then developed truncal and leg movements  in day  dec by intension  has been off med for 3 weeks and no improvement in these movements .  Uncertain of next step althgouh working with psych.  Considering stopping the latuda all together .  I suggest  psych med consults opinion.  I will re refer to Dr Tat for other input opinion who saw her when she had a tremor that was felt secondary to Lithium  in 2021   -Patient advised to return or notify health care team  if  new concerns arise. Fosomax and albuterol refilled today  Patient Instructions  I will refer to dr Tat for another opinion  but  timing goes with medication  problem. I agree with psychiatrist medication consult . In interim   Ask about the remeron. Dosing and expectation.   Neta Mends.  Oumar Marcott M.D.

## 2023-07-09 NOTE — Patient Instructions (Signed)
I will refer to dr Tat for another opinion  but  timing goes with medication  problem. I agree with psychiatrist medication consult . In interim   Ask about the remeron. Dosing and expectation.

## 2023-07-10 ENCOUNTER — Encounter: Payer: Self-pay | Admitting: Internal Medicine

## 2023-07-11 ENCOUNTER — Telehealth: Payer: Self-pay

## 2023-07-11 NOTE — Telephone Encounter (Signed)
Spoke to pt and inform her of provider's message. Pt verbalized understanding.

## 2023-07-11 NOTE — Telephone Encounter (Signed)
-----   Message from Berniece Andreas sent at 07/10/2023 11:06 AM EST ----- Rip Harbour   I will tell her that a  psychiatry med consult would be the best  path .  WP ----- Message ----- From: Vladimir Faster, DO Sent: 07/10/2023  10:02 AM EST To: Madelin Headings, MD  Is she working with psychiatry?  If so, they should be able to manage the meds that they started ----- Message ----- From: Madelin Headings, MD Sent: 07/09/2023   4:16 PM EST To: Octaviano Batty Tat, DO  HI  You saw this nurse as a patient  in 2021 for tremor felt to be from lithium, now new different sets of  motor  movements; would appreciate opinion about next step and which meds are most likely the problem.  Thanks for your help  [facility]

## 2023-07-12 ENCOUNTER — Encounter: Payer: Self-pay | Admitting: Gastroenterology

## 2023-07-12 ENCOUNTER — Telehealth: Payer: Self-pay | Admitting: Physician Assistant

## 2023-07-12 NOTE — Telephone Encounter (Signed)
Noted.  Will discuss further at 12/31 appt

## 2023-07-12 NOTE — Telephone Encounter (Signed)
Pt notified.  She said that Dr. Arbutus Leas refused her bc its about med management.

## 2023-07-12 NOTE — Telephone Encounter (Signed)
Have her take Mirtazapine 15 mg every night which helps the abnormal movements. Also take the Xanax during the day for it when needed.  If she can't tolerate that, I'll change to Klonopin.  I agree w/ her going back to Dr. Arbutus Leas, as per her PCP's note recently.

## 2023-07-12 NOTE — Telephone Encounter (Signed)
Next appt is 07/30/23. Lisa Lee called stating that her PCP saw some abnormal movements. She recommended that Lisa Lee have her medications looked at. Her phone number is [phone].

## 2023-07-12 NOTE — Telephone Encounter (Signed)
Lisa Lee reports that her abnormal movements have not improved since she stopped Ingrezza 3 weeks ago.  She reports taking half dose of Remeron which helped with sleep but did not help with the movements. She took a full dose of Remeron and it still did not help the movements. So, she has discontinued the Remeron on 12/6. She is not sure if she was taking it correctly.   She started taking Latuda MWF (STARTED THAT 12/9) She mentions she has not noticed anything with her mental health.   Please advise.

## 2023-07-17 ENCOUNTER — Encounter: Payer: BC Managed Care – PPO | Admitting: Internal Medicine

## 2023-07-22 ENCOUNTER — Encounter: Payer: Self-pay | Admitting: Gastroenterology

## 2023-07-22 ENCOUNTER — Ambulatory Visit: Payer: BC Managed Care – PPO | Admitting: Gastroenterology

## 2023-07-22 VITALS — BP 93/57 | HR 55 | Temp 97.9°F | Resp 14 | Ht 61.0 in | Wt 137.0 lb

## 2023-07-22 DIAGNOSIS — K5730 Diverticulosis of large intestine without perforation or abscess without bleeding: Secondary | ICD-10-CM | POA: Diagnosis not present

## 2023-07-22 DIAGNOSIS — Z1211 Encounter for screening for malignant neoplasm of colon: Secondary | ICD-10-CM

## 2023-07-22 DIAGNOSIS — D128 Benign neoplasm of rectum: Secondary | ICD-10-CM

## 2023-07-22 DIAGNOSIS — K6289 Other specified diseases of anus and rectum: Secondary | ICD-10-CM

## 2023-07-22 DIAGNOSIS — Z800 Family history of malignant neoplasm of digestive organs: Secondary | ICD-10-CM

## 2023-07-22 DIAGNOSIS — D122 Benign neoplasm of ascending colon: Secondary | ICD-10-CM

## 2023-07-22 MED ORDER — SODIUM CHLORIDE 0.9 % IV SOLN: 500.0000 mL | Freq: Once | INTRAVENOUS | Status: AC

## 2023-07-22 NOTE — Progress Notes (Signed)
Pt's states no medical or surgical changes since previsit or office visit. 

## 2023-07-22 NOTE — Progress Notes (Signed)
Sedate, gd SR, tolerated procedure well, VSS, report to RN 

## 2023-07-22 NOTE — Op Note (Signed)
[facility] Patient Name: Lisa Lee Procedure Date: 07/22/2023 1:53 PM MRN: [id number] Endoscopist: Viviann Spare P. Adela Lank , MD, [id number] Age: 62 Referring MD:  Date of Birth: 16-Nov-1960 Gender: Female Account #: [id number] Procedure:                Colonoscopy Indications:              Screening in patient at increased risk: Family                            history of 1st-degree relative with colorectal                            cancer (father dx around age 74) Medicines:                Monitored Anesthesia Care Procedure:                Pre-Anesthesia Assessment:                           - Prior to the procedure, a History and Physical                            was performed, and patient medications and                            allergies were reviewed. The patient's tolerance of                            previous anesthesia was also reviewed. The risks                            and benefits of the procedure and the sedation                            options and risks were discussed with the patient.                            All questions were answered, and informed consent                            was obtained. Prior Anticoagulants: The patient has                            taken no anticoagulant or antiplatelet agents. ASA                            Grade Assessment: II - A patient with mild systemic                            disease. After reviewing the risks and benefits,                            the patient was deemed in satisfactory condition to  undergo the procedure.                           After obtaining informed consent, the colonoscope                            was passed under direct vision. Throughout the                            procedure, the patient's blood pressure, pulse, and                            oxygen saturations were monitored continuously. The                            Olympus Scope SN:  [id number] was introduced through                            the anus and advanced to the the cecum, identified                            by appendiceal orifice and ileocecal valve. The                            colonoscopy was performed without difficulty. The                            patient tolerated the procedure well. The quality                            of the bowel preparation was good. The ileocecal                            valve, appendiceal orifice, and rectum were                            photographed. Scope In: 1:59:07 PM Scope Out: 2:16:20 PM Scope Withdrawal Time: 0 hours 13 minutes 34 seconds  Total Procedure Duration: 0 hours 17 minutes 13 seconds  Findings:                 The perianal and digital rectal examinations were                            normal.                           A 3 mm polyp was found in the ascending colon. The                            polyp was sessile. The polyp was removed with a                            cold snare. Resection and retrieval were complete.  A 5 mm polyp was found in the rectum. The polyp was                            sessile. The polyp was removed with a cold snare.                            Resection and retrieval were complete.                           Multiple small-mouthed diverticula were found in                            the entire colon.                           Anal papilla(e) were hypertrophied.                           The exam was otherwise without abnormality. Complications:            No immediate complications. Estimated blood loss:                            Minimal. Estimated Blood Loss:     Estimated blood loss was minimal. Impression:               - One 3 mm polyp in the ascending colon, removed                            with a cold snare. Resected and retrieved.                           - One 5 mm polyp in the rectum, removed with a cold                             snare. Resected and retrieved.                           - Diverticulosis in the entire examined colon.                           - Anal papilla(e) were hypertrophied.                           - The examination was otherwise normal. Recommendation:           - Patient has a contact number available for                            emergencies. The signs and symptoms of potential                            delayed complications were discussed with the  patient. Return to normal activities tomorrow.                            Written discharge instructions were provided to the                            patient.                           - Resume previous diet.                           - Continue present medications.                           - Await pathology results. Anticipate repeat                            colonoscopy in 5 years given strong family history                            of colon cancer [name], MD 07/22/2023 2:21:11 PM This report has been signed electronically.

## 2023-07-22 NOTE — Patient Instructions (Addendum)
Recommendation:  - Patient has a contact number available for emergencies. The signs and symptoms of potential delayed complications were discussed with the patient. Return to normal activities tomorrow. Written discharge instructions were provided to the patient.  - Resume previous diet.  - Continue present medications.  - Await pathology results. Anticipate repeat colonoscopy in 5 years given strong family history of colon cancer   YOU HAD AN ENDOSCOPIC PROCEDURE TODAY AT THE [facility]:   Refer to the procedure report that was given to you for any specific questions about what was found during the examination.  If the procedure report does not answer your questions, please call your gastroenterologist to clarify.  If you requested that your care partner not be given the details of your procedure findings, then the procedure report has been included in a sealed envelope for you to review at your convenience later.  YOU SHOULD EXPECT: Some feelings of bloating in the abdomen. Passage of more gas than usual.  Walking can help get rid of the air that was put into your GI tract during the procedure and reduce the bloating. If you had a lower endoscopy (such as a colonoscopy or flexible sigmoidoscopy) you may notice spotting of blood in your stool or on the toilet paper. If you underwent a bowel prep for your procedure, you may not have a normal bowel movement for a few days.  Please Note:  You might notice some irritation and congestion in your nose or some drainage.  This is from the oxygen used during your procedure.  There is no need for concern and it should clear up in a day or so.  SYMPTOMS TO REPORT IMMEDIATELY:  Following lower endoscopy (colonoscopy or flexible sigmoidoscopy):  Excessive amounts of blood in the stool  Significant tenderness or worsening of abdominal pains  Swelling of the abdomen that is new, acute  Fever of 100F or higher   For urgent or emergent issues,  a gastroenterologist can be reached at any hour by calling [phone]. Do not use MyChart messaging for urgent concerns.    DIET:  We do recommend a small meal at first, but then you may proceed to your regular diet.  Drink plenty of fluids but you should avoid alcoholic beverages for 24 hours.  MEDICATIONS: Continue present medications.   Please see handouts given to you by your recovery nurse: Polyp, Diverticulosis.  FOLLOW UP: Await pathology results. Anticipate repeat colonoscopy in 5 years given strong family history of colon cancer.  Thank you for allowing [place] to provide for your healthcare needs today.  ACTIVITY:  You should plan to take it easy for the rest of today and you should NOT DRIVE or use heavy machinery until tomorrow (because of the sedation medicines used during the test).    FOLLOW UP: Our staff will call the number listed on your records the next business day following your procedure.  We will call around 7:15- 8:00 am to check on you and address any questions or concerns that you may have regarding the information given to you following your procedure. If we do not reach you, we will leave a message.     If any biopsies were taken you will be contacted by phone or by letter within the next 1-3 weeks.  Please call us at [phone] if you have not heard about the biopsies in 3 weeks.    SIGNATURES/CONFIDENTIALITY: You and/or your care partner have signed paperwork which will be entered  into your electronic medical record.  These signatures attest to the fact that that the information above on your After Visit Summary has been reviewed and is understood.  Full responsibility of the confidentiality of this discharge information lies with you and/or your care-partner.

## 2023-07-22 NOTE — Progress Notes (Signed)
[name]   Primary Care Physician:  Lisa Headings, MD   Reason for Procedure:   Family history of colon cancer  Plan:    colonoscopy     HPI: Lisa Lee is a 62 y.o. female  here for colonoscopy screening. Father had CRC dx age around age 61. Last exam 2019 without polyps but reported history of adenoma in the past.   Patient denies any bowel symptoms at this time.  Otherwise feels well without any cardiopulmonary symptoms.   I have discussed risks / benefits of anesthesia and endoscopic procedure with Lisa Lee and they wish to proceed with the exams as outlined today.    Past Medical History:  Diagnosis Date   Acid reflux    Asthma    Bipolar disorder (HCC)    Depression    Diarrhea 08/09/2011   Poss ibs  Vs other  Related to stress  consdier other eval if needed. Had colonoscoopy per dr Lisa Lee    High cholesterol    History of hypertension    no meds for last year  per pt on 03-08-2022   HPV in female 1984   Hypothyroidism    PMB (postmenopausal bleeding)    Thyroid disease     Past Surgical History:  Procedure Laterality Date   CERVICAL CONIZATION W/BX N/A 07/21/2015   Procedure: CONIZATION CERVIX WITH BIOPSY;  Surgeon: Lisa Hun, MD;  Location: [facility];  Service: Gynecology;  Laterality: N/A;   CERVICAL CONIZATION W/BX N/A 09/25/2018   Procedure: CONIZATION CERVIX WITH BIOPSY;  Surgeon: Lisa Birchwood, MD;  Location: [facility];  Service: Gynecology;  Laterality: N/A;   CERVICAL CRYOTHERAPY  07/30/1982   colonscopy  07/04/2018   COLPOSCOPY  08/26/2013   DILATATION & CURETTAGE/HYSTEROSCOPY WITH MYOSURE N/A 03/16/2022   Procedure: ATTEMPTED DILATATION & CURETTAGE/ATTEMPTED HYSTEROSCOPY WITH MYOSURE, POLYPECTOMY;  Surgeon: Lisa Cote, MD;  Location: [facility];  Service: Gynecology;  Laterality: N/A;   DILATION AND CURETTAGE, DIAGNOSTIC / THERAPEUTIC  07/30/1992    Blighted Ovum   HYSTEROSCOPY     uterine polypectomy Jan 7th   HYSTEROSCOPY WITH RESECTOSCOPE  08/04/2009   Removed polyp & IUD   LYMPH NODE BIOPSY     40 yrs ago   OPERATIVE ULTRASOUND N/A 03/16/2022   Procedure: OPERATIVE ULTRASOUND;  Surgeon: Lisa Cote, MD;  Location: [facility];  Service: Gynecology;  Laterality: N/A;   TONSILLECTOMY     as child   UPPER GASTROINTESTINAL ENDOSCOPY  01/11/2023   UPPER GI ENDOSCOPY  07/04/2018    Prior to Admission medications   Medication Sig Start Date End Date Taking? Authorizing Provider  alendronate (FOSAMAX) 70 MG tablet Take 1 tablet (70 mg total) by mouth every 7 (seven) days. Take with a full glass of water on an empty stomach. 07/09/23  Yes Lisa Lee, Lisa Mends, MD  ALPRAZolam Prudy Feeler) 0.5 MG tablet Take 0.5-1 tablets (0.25-0.5 mg total) by mouth 2 (two) times daily as needed for anxiety. 06/21/23  Yes Lisa Ouch, PA-C  aspirin EC 81 MG tablet Take 1 tablet (81 mg total) by mouth daily. 08/28/11  Yes Lisa Morale, MD  atorvastatin (LIPITOR) 10 MG tablet Take 1 tablet (10 mg total) by mouth at bedtime. 08/28/22  Yes Lisa Sprague, MD  Cholecalciferol (VITAMIN D) 2000 units tablet Take 2,000 Units by mouth daily.   Yes [provider]  escitalopram (LEXAPRO) 20 MG tablet Take 1 tablet (20 mg  total) by mouth daily. 06/21/23  Yes Lisa Overly T, PA-C  ezetimibe (ZETIA) 10 MG tablet Take 1 tablet (10 mg total) by mouth daily. 08/28/22  Yes Lisa Sprague, MD  fenofibrate 160 MG tablet Take 1 tablet (160 mg total) by mouth at bedtime. 03/06/23  Yes Lisa Sprague, MD  levothyroxine (SYNTHROID) 75 MCG tablet TAKE 1 TABLET BY MOUTH EVERY DAY BEFORE breakfast 04/18/23  Yes Lisa Lee, Lisa Mends, MD  lurasidone (LATUDA) 20 MG TABS tablet TAKE 1 TABLET DAILY WITH   SUPPER 02/22/23  Yes Lee, Lisa T, PA-C  Multiple Vitamins-Minerals (MULTIVITAMIN WITH MINERALS) tablet Take 1 tablet by mouth daily.   Yes  [provider]  ondansetron (ZOFRAN) 4 MG tablet Take 1 tablet (4 mg total) by mouth every 8 (eight) hours as needed for nausea or vomiting. 07/08/23  Yes Lisa Lee, Lisa Rayas, MD  OVER THE COUNTER MEDICATION Calcium supplement 600mg  with vitamin D 500 units- BID   Yes [provider]  pantoprazole (PROTONIX) 40 MG tablet Take 1 tablet (40 mg total) by mouth 2 (two) times daily before a meal. 02/05/23  Yes Lisa Albee, PA-C  Polyethylene Glycol 3350 (MIRALAX PO) Take 238 g by mouth once.   Yes [provider]  valACYclovir (VALTREX) 500 MG tablet TAKE 1 TABLET DAILY FOR    SUPPRESSION AND 1 TABLET   TWO TIMES A DAY FOR 3 DAYS FOR OUTBREAK Strength: 500 mg 03/12/23  Yes Lisa Lee, Lisa Mends, MD  albuterol (PROAIR HFA) 108 (90 Base) MCG/ACT inhaler Inhale 2 puffs into the lungs every 6 (six) hours as needed for wheezing. 07/09/23   Lisa Lee, Lisa Mends, MD  doxylamine, Sleep, (UNISOM) 25 MG tablet Take 25 mg by mouth at bedtime.    [provider]  estradiol (ESTRACE) 0.1 MG/GM vaginal cream Place vaginally.    [provider]  loperamide (IMODIUM A-D) 2 MG tablet Take 2 mg by mouth as needed.    [provider]  mirtazapine (REMERON) 15 MG tablet Take 1/2-1 TABLET at bedtime PRN Patient not taking: Reported on 07/22/2023 06/24/23   Lisa Ouch, PA-C    Current Outpatient Medications  Medication Sig Dispense Refill   alendronate (FOSAMAX) 70 MG tablet Take 1 tablet (70 mg total) by mouth every 7 (seven) days. Take with a full glass of water on an empty stomach. 12 tablet 3   ALPRAZolam (XANAX) 0.5 MG tablet Take 0.5-1 tablets (0.25-0.5 mg total) by mouth 2 (two) times daily as needed for anxiety. 60 tablet 1   aspirin EC 81 MG tablet Take 1 tablet (81 mg total) by mouth daily.     atorvastatin (LIPITOR) 10 MG tablet Take 1 tablet (10 mg total) by mouth at bedtime. 90 tablet 4   Cholecalciferol (VITAMIN D) 2000 units tablet Take 2,000 Units by mouth  daily.     escitalopram (LEXAPRO) 20 MG tablet Take 1 tablet (20 mg total) by mouth daily. 90 tablet 1   ezetimibe (ZETIA) 10 MG tablet Take 1 tablet (10 mg total) by mouth daily. 90 tablet 4   fenofibrate 160 MG tablet Take 1 tablet (160 mg total) by mouth at bedtime. 60 tablet 2   levothyroxine (SYNTHROID) 75 MCG tablet TAKE 1 TABLET BY MOUTH EVERY DAY BEFORE breakfast 90 tablet 3   lurasidone (LATUDA) 20 MG TABS tablet TAKE 1 TABLET DAILY WITH   SUPPER 90 tablet 3   Multiple Vitamins-Minerals (MULTIVITAMIN WITH MINERALS) tablet Take 1 tablet by mouth daily.  ondansetron (ZOFRAN) 4 MG tablet Take 1 tablet (4 mg total) by mouth every 8 (eight) hours as needed for nausea or vomiting. 4 tablet 0   OVER THE COUNTER MEDICATION Calcium supplement 600mg  with vitamin D 500 units- BID     pantoprazole (PROTONIX) 40 MG tablet Take 1 tablet (40 mg total) by mouth 2 (two) times daily before a meal. 60 tablet 1   Polyethylene Glycol 3350 (MIRALAX PO) Take 238 g by mouth once.     valACYclovir (VALTREX) 500 MG tablet TAKE 1 TABLET DAILY FOR    SUPPRESSION AND 1 TABLET   TWO TIMES A DAY FOR 3 DAYS FOR OUTBREAK Strength: 500 mg 90 tablet 3   albuterol (PROAIR HFA) 108 (90 Base) MCG/ACT inhaler Inhale 2 puffs into the lungs every 6 (six) hours as needed for wheezing. 1 each 1   doxylamine, Sleep, (UNISOM) 25 MG tablet Take 25 mg by mouth at bedtime.     estradiol (ESTRACE) 0.1 MG/GM vaginal cream Place vaginally.     loperamide (IMODIUM A-D) 2 MG tablet Take 2 mg by mouth as needed.     mirtazapine (REMERON) 15 MG tablet Take 1/2-1 TABLET at bedtime PRN (Patient not taking: Reported on 07/22/2023) 30 tablet 0   Current Facility-Administered Medications  Medication Dose Route Frequency Provider Last Rate Last Admin   0.9 %  sodium chloride infusion  500 mL Intravenous Once Nga Rabon, Lisa Rayas, MD        Allergies as of 07/22/2023 - Review Complete 07/22/2023  Allergen Reaction Noted   Penicillins Hives,  Shortness Of Breath, and Swelling 10/30/2019   Ingrezza [valbenazine tosylate]  07/09/2023   Pravastatin Other (See Comments) 08/31/2016   Rosuvastatin Other (See Comments) 01/09/2016    Family History  Problem Relation Age of Onset   Arthritis Mother    Heart disease Mother    Rectal cancer Father    Heart disease Father    Colon cancer Father    Parkinson's disease Father    Heart disease Sister 78       cabg stent   Heart disease Sister    Thyroid disease Sister    Heart disease Brother    Pulmonary embolism Daughter    Thyroid disease Daughter    Esophageal cancer Neg Hx    Stomach cancer Neg Hx     Social History   Socioeconomic History   Marital status: Married    Spouse name: Not on file   Number of children: Not on file   Years of education: Not on file   Highest education level: Bachelor's degree (e.g., BA, AB, BS)  Occupational History   Not on file  Tobacco Use   Smoking status: Never   Smokeless tobacco: Never  Vaping Use   Vaping status: Never Used  Substance and Sexual Activity   Alcohol use: Yes    Alcohol/week: 0.0 standard drinks of alcohol    Comment: monthly ocassional drink   Drug use: No   Sexual activity: Yes    Birth control/protection: None  Other Topics Concern   Not on file  Social History Narrative    [employer] 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    Divorced   Regular exercise-  Not as much recently    [facility] of [facility]   Pets 2 cats 1 dog to move   Daughter  On recovery heroin   Social Drivers of Corporate investment banker Strain: Low  Risk  (01/07/2023)   Overall Financial Resource Strain (CARDIA)    Difficulty of Paying Living Expenses: Not hard at all  Food Insecurity: No Food Insecurity (01/07/2023)   Hunger Vital Sign    Worried About Running Out of Food in the Last Year: Never true    Ran Out of Food in the Last Year: Never true  Transportation Needs: No Transportation Needs (01/07/2023)    PRAPARE - Administrator, Civil Service (Medical): No    Lack of Transportation (Non-Medical): No  Physical Activity: Unknown (01/07/2023)   Exercise Vital Sign    Days of Exercise per Week: 0 days    Minutes of Exercise per Session: Not on file  Stress: Stress Concern Present (01/07/2023)   [employer] of Occupational Health - Occupational Stress Questionnaire    Feeling of Stress : To some extent  Social Connections: Moderately Isolated (01/07/2023)   Social Connection and Isolation Panel [NHANES]    Frequency of Communication with Friends and Family: More than three times a week    Frequency of Social Gatherings with Friends and Family: Once a week    Attends Religious Services: Never    Database administrator or Organizations: No    Attends Engineer, structural: Not on file    Marital Status: Married  Intimate Partner Violence: Unknown (11/02/2021)   Received from [employer], [facility]   HITS    Physically Hurt: Not on file    Insult or Talk Down To: Not on file    Threaten Physical Harm: Not on file    Scream or Curse: Not on file    Review of Systems: All other review of systems negative except as mentioned in the HPI.  Physical Exam: Vital signs BP 122/65   Pulse 63   Temp 97.9 F (36.6 C) (Temporal)   Ht 5\' 1"  (1.549 m)   Wt 137 lb (62.1 kg)   LMP 12/09/2013   SpO2 96%   BMI 25.89 kg/m   General:   Alert,  Well-developed, pleasant and cooperative in NAD Lungs:  Clear throughout to auscultation.   Heart:  Regular rate and rhythm Abdomen:  Soft, nontender and nondistended.   Neuro/Psych:  Alert and cooperative. Normal mood and affect. A and O x 3  Harlin Rain, MD [facility]

## 2023-07-22 NOTE — Progress Notes (Signed)
Called to room to assist during endoscopic procedure.  Patient ID and intended procedure confirmed with present staff. Received instructions for my participation in the procedure from the performing physician.  

## 2023-07-23 ENCOUNTER — Telehealth: Payer: Self-pay

## 2023-07-23 NOTE — Telephone Encounter (Signed)
  Follow up Call-     07/22/2023    1:13 PM 01/11/2023    2:33 PM  Call back number  Post procedure Call Back phone  # [phone]  Permission to leave phone message Yes Yes     Patient questions:  Do you have a fever, pain , or abdominal swelling? No. Pain Score  0 *  Have you tolerated food without any problems? Yes.    Have you been able to return to your normal activities? Yes.    Do you have any questions about your discharge instructions: Diet   No. Medications  No. Follow up visit  No.  Do you have questions or concerns about your Care? No.  Actions: * If pain score is 4 or above: No action needed, pain <4.

## 2023-07-26 ENCOUNTER — Other Ambulatory Visit: Payer: Self-pay | Admitting: Physician Assistant

## 2023-07-29 LAB — SURGICAL PATHOLOGY

## 2023-07-29 NOTE — Progress Notes (Signed)
 Chief Complaint  Patient presents with   Annual Exam    HPI: Patient  Lisa Lee  62 y.o. comes in today for Preventive Health Care visit  See pasts notes  about involuntary movements  getting better  by med adjustment .  Told to take remeron  all  and no latuda   is better .   Osteoporosis on fosamax  denies se  Thyroid  daily meds  Cards lipid management  cards On ppi now once a day from bid  Taking 2000 x 2 international units d Resp  stable  Health Maintenance  Topic Date Due   COVID-19 Vaccine (5 - 2024-25 season) 08/15/2023 (Originally 03/31/2023)   MAMMOGRAM  11/27/2023 (Originally 04/13/2023)   Cervical Cancer Screening (HPV/Pap Cotest)  12/09/2025   Colonoscopy  07/21/2028   DTaP/Tdap/Td (3 - Td or Tdap) 02/23/2030   INFLUENZA VACCINE  Completed   Hepatitis C Screening  Completed   HIV Screening  Completed   Zoster Vaccines- Shingrix  Completed   HPV VACCINES  Aged Out   Health Maintenance Review LIFESTYLE:  Exercise:  took up yoga  2 x per weej  Tobacco/ETS:n Alcohol: little Sugar beverages: Sleep:  better  enough  Drug use: no HH of 2  1 pet  Work: teaching  5 d per week     ROS:  GEN/ HEENT: No fever, significant weight changes sweats headaches vision problems hearing changes, CV/ PULM; No chest pain shortness of breath cough, syncope,edema  change in exercise tolerance. GI /GU: No adominal pain, vomiting, change in bowel habits. No blood in the stool. No significant GU symptoms. SKIN/HEME: ,no acute skin rashes suspicious lesions or bleeding. No lymphadenopathy, nodules, masses. Year of scale area left leg  NEURO/ PSYCH:  No neurologic signs such as weakness numbness. No depression anxiety. IMM/ Allergy : No unusual infections.  Allergy  .   REST of 12 system review negative except as per HPI   Past Medical History:  Diagnosis Date   Acid reflux    Asthma    Bipolar disorder (HCC)    Depression    Diarrhea 08/09/2011   Poss ibs  Vs other   Related to stress  consdier other eval if needed. Had colonoscoopy per dr Donnald    High cholesterol    History of hypertension    no meds for last year  per pt on 03-08-2022   HPV in female 1984   Hypothyroidism    PMB (postmenopausal bleeding)    Thyroid  disease     Past Surgical History:  Procedure Laterality Date   CERVICAL CONIZATION W/BX N/A 07/21/2015   Procedure: CONIZATION CERVIX WITH BIOPSY;  Surgeon: Rome Rigg, MD;  Location: [facility];  Service: Gynecology;  Laterality: N/A;   CERVICAL CONIZATION W/BX N/A 09/25/2018   Procedure: CONIZATION CERVIX WITH BIOPSY;  Surgeon: Eloy Herring, MD;  Location: [facility];  Service: Gynecology;  Laterality: N/A;   CERVICAL CRYOTHERAPY  07/30/1982   colonscopy  07/04/2018   COLPOSCOPY  08/26/2013   DILATATION & CURETTAGE/HYSTEROSCOPY WITH MYOSURE N/A 03/16/2022   Procedure: ATTEMPTED DILATATION & CURETTAGE/ATTEMPTED HYSTEROSCOPY WITH MYOSURE, POLYPECTOMY;  Surgeon: Estelle Service, MD;  Location: [facility];  Service: Gynecology;  Laterality: N/A;   DILATION AND CURETTAGE, DIAGNOSTIC / THERAPEUTIC  07/30/1992   Blighted Ovum   HYSTEROSCOPY     uterine polypectomy Jan 7th   HYSTEROSCOPY WITH RESECTOSCOPE  08/04/2009   Removed polyp & IUD   LYMPH NODE BIOPSY     40  yrs ago   OPERATIVE ULTRASOUND N/A 03/16/2022   Procedure: OPERATIVE ULTRASOUND;  Surgeon: Estelle Service, MD;  Location: [facility];  Service: Gynecology;  Laterality: N/A;   TONSILLECTOMY     as child   UPPER GASTROINTESTINAL ENDOSCOPY  01/11/2023   UPPER GI ENDOSCOPY  07/04/2018    Family History  Problem Relation Age of Onset   Arthritis Mother    Heart disease Mother    Rectal cancer Father    Heart disease Father    Colon cancer Father    Parkinson's disease Father    Heart disease Sister 48       cabg stent   Heart disease Sister    Thyroid  disease Sister    Heart disease Brother    Pulmonary  embolism Daughter    Thyroid  disease Daughter    Esophageal cancer Neg Hx    Stomach cancer Neg Hx     Social History   Socioeconomic History   Marital status: Married    Spouse name: Not on file   Number of children: Not on file   Years of education: Not on file   Highest education level: Bachelor's degree (e.g., BA, AB, BS)  Occupational History   Not on file  Tobacco Use   Smoking status: Never   Smokeless tobacco: Never  Vaping Use   Vaping status: Never Used  Substance and Sexual Activity   Alcohol use: Yes    Alcohol/week: 0.0 standard drinks of alcohol    Comment: monthly ocassional drink   Drug use: No   Sexual activity: Yes    Birth control/protection: None  Other Topics Concern   Not on file  Social History Narrative    [employer]. 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    Divorced   Regular exercise-  Not as much recently    [facility] of [facility]   Pets 2 cats 1 dog to move   Daughter  On recovery heroin   Social Drivers of Corporate Investment Banker Strain: Low Risk  (07/29/2023)   Overall Financial Resource Strain (CARDIA)    Difficulty of Paying Living Expenses: Not hard at all  Food Insecurity: No Food Insecurity (07/29/2023)   Hunger Vital Sign    Worried About Running Out of Food in the Last Year: Never true    Ran Out of Food in the Last Year: Never true  Transportation Needs: No Transportation Needs (07/29/2023)   PRAPARE - Administrator, Civil Service (Medical): No    Lack of Transportation (Non-Medical): No  Physical Activity: Insufficiently Active (07/29/2023)   Exercise Vital Sign    Days of Exercise per Week: 2 days    Minutes of Exercise per Session: 60 min  Stress: No Stress Concern Present (07/29/2023)   [employer] of Occupational Health - Occupational Stress Questionnaire    Feeling of Stress : Not at all  Social Connections: Moderately Isolated (07/29/2023)   Social Connection and  Isolation Panel [NHANES]    Frequency of Communication with Friends and Family: More than three times a week    Frequency of Social Gatherings with Friends and Family: Once a week    Attends Religious Services: Never    Database Administrator or Organizations: No    Attends Engineer, Structural: Not on file    Marital Status: Married    Outpatient Medications Prior to Visit  Medication Sig Dispense Refill  albuterol  (PROAIR  HFA) 108 (90 Base) MCG/ACT inhaler Inhale 2 puffs into the lungs every 6 (six) hours as needed for wheezing. 1 each 1   alendronate  (FOSAMAX ) 70 MG tablet Take 1 tablet (70 mg total) by mouth every 7 (seven) days. Take with a full glass of water on an empty stomach. 12 tablet 3   ALPRAZolam  (XANAX ) 0.5 MG tablet Take 0.5-1 tablets (0.25-0.5 mg total) by mouth 2 (two) times daily as needed for anxiety. 60 tablet 1   aspirin EC 81 MG tablet Take 1 tablet (81 mg total) by mouth daily.     atorvastatin  (LIPITOR) 10 MG tablet Take 1 tablet (10 mg total) by mouth at bedtime. 90 tablet 4   Cholecalciferol (VITAMIN D ) 2000 units tablet Take 2,000 Units by mouth daily.     doxylamine, Sleep, (UNISOM) 25 MG tablet Take 25 mg by mouth at bedtime.     escitalopram  (LEXAPRO ) 20 MG tablet Take 1 tablet (20 mg total) by mouth daily. 90 tablet 1   estradiol  (ESTRACE) 0.1 MG/GM vaginal cream Place vaginally.     ezetimibe  (ZETIA ) 10 MG tablet Take 1 tablet (10 mg total) by mouth daily. 90 tablet 4   fenofibrate  160 MG tablet Take 1 tablet (160 mg total) by mouth at bedtime. 60 tablet 2   levothyroxine  (SYNTHROID ) 75 MCG tablet TAKE 1 TABLET BY MOUTH EVERY DAY BEFORE breakfast 90 tablet 3   loperamide (IMODIUM A-D) 2 MG tablet Take 2 mg by mouth as needed.     Multiple Vitamins-Minerals (MULTIVITAMIN WITH MINERALS) tablet Take 1 tablet by mouth daily.     OVER THE COUNTER MEDICATION Calcium  supplement 600mg  with vitamin D  500 units- BID     pantoprazole  (PROTONIX ) 40 MG tablet  Take 1 tablet (40 mg total) by mouth 2 (two) times daily before a meal. 60 tablet 1   valACYclovir  (VALTREX ) 500 MG tablet TAKE 1 TABLET DAILY FOR    SUPPRESSION AND 1 TABLET   TWO TIMES A DAY FOR 3 DAYS FOR OUTBREAK Strength: 500 mg 90 tablet 3   lurasidone  (LATUDA ) 20 MG TABS tablet TAKE 1 TABLET DAILY WITH   SUPPER (Patient not taking: Reported on 07/30/2023) 90 tablet 3   mirtazapine  (REMERON ) 15 MG tablet TAKE 1/2 TO 1 TABLET BY MOUTH AT BEDTIME AS NEEDED 30 tablet 0   ondansetron  (ZOFRAN ) 4 MG tablet Take 1 tablet (4 mg total) by mouth every 8 (eight) hours as needed for nausea or vomiting. 4 tablet 0   Polyethylene Glycol 3350 (MIRALAX PO) Take 238 g by mouth once.     Facility-Administered Medications Prior to Visit  Medication Dose Route Frequency Provider Last Rate Last Admin   0.9 %  sodium chloride  infusion  500 mL Intravenous Once Armbruster, Elspeth SQUIBB, MD         EXAM:  BP 126/60   Pulse 68   Temp 98.3 F (36.8 C) (Oral)   Ht 5' 1 (1.549 m)   Wt 137 lb (62.1 kg)   LMP 12/09/2013   SpO2 97%   BMI 25.89 kg/m   Body mass index is 25.89 kg/m. Wt Readings from Last 3 Encounters:  07/30/23 137 lb (62.1 kg)  07/22/23 137 lb (62.1 kg)  07/09/23 137 lb 12.8 oz (62.5 kg)    Physical Exam: Vital signs reviewed HZW:Uypd is a well-developed well-nourished alert cooperative    who appearsr stated age in no acute distress. Some excess motor movement ( subtle  ) nl speech and  affect  HEENT: normocephalic atraumatic , Eyes: PERRL EOM's full, conjunctiva clear, Nares: paten,t no deformity discharge or tenderness., Ears: no deformity EAC's wax  not impacted  TMs with normal landmarks. Mouth: clear OP, no lesions, edema.  Moist mucous membranes. Dentition in adequate repair. NECK: supple without masses, thyromegaly or bruits. CHEST/PULM:  Clear to auscultation and percussion breath sounds equal no wheeze , rales or rhonchi.  Breast: normal by inspection . No dimpling, discharge,  masses, tenderness or discharge . CV: PMI is nondisplaced, S1 S2 no gallops, murmurs, rubs. Peripheral pulses are full without delay.No JVD .  ABDOMEN: Bowel sounds normal nontender  No guard or rebound, no hepato splenomegal no CVA tenderness.  No hernia. Extremtities:  No clubbing cyanosis or edema, no acute joint swelling or redness no focal atrophy NEURO:  Oriented x3, cranial nerves 3-12 appear to be intact, no obvious focal weakness,gait within normal limits no abnormal reflexes or asymmetrical SKIN: No acute rashes normal turgor, color, no bruising or petechiae. 3-4 mmflesh colored scale flat no redness  PSYCH: Oriented, good eye contact, no obvious depression anxiety, cognition and judgment appear normal. LN: no cervical axillary adenopathy  Lab Results  Component Value Date   WBC 8.6 07/30/2023   HGB 14.2 07/30/2023   HCT 42.2 07/30/2023   PLT 309.0 07/30/2023   GLUCOSE 78 07/30/2023   CHOL 168 07/30/2023   TRIG 171.0 (H) 07/30/2023   HDL 53.60 07/30/2023   LDLDIRECT 101.0 10/30/2016   LDLCALC 80 07/30/2023   ALT 41 (H) 07/30/2023   AST 30 07/30/2023   NA 141 07/30/2023   K 4.7 07/30/2023   CL 106 07/30/2023   CREATININE 1.01 07/30/2023   BUN 19 07/30/2023   CO2 24 07/30/2023   TSH 1.06 07/30/2023   HGBA1C 6.1 07/30/2023    BP Readings from Last 3 Encounters:  07/30/23 126/60  07/22/23 (!) 93/57  07/09/23 130/70    Lab update plan reviewed with patient  fasting  ASSESSMENT AND PLAN:  Discussed the following assessment and plan:    ICD-10-CM   1. Visit for preventive health examination  Z00.00 CBC with Differential/Platelet    Comprehensive metabolic panel    Lipid panel    TSH    Hemoglobin A1c    Lipoprotein A (LPA)    2. Influenza vaccine needed  Z23 Flu vaccine trivalent PF, 6mos and older(Flulaval,Afluria,Fluarix,Fluzone)    3. Medication management  Z79.899 CBC with Differential/Platelet    Comprehensive metabolic panel    Lipid panel    TSH     Hemoglobin A1c    Vitamin D , 25-hydroxy    Lipoprotein A (LPA)    4. Hypothyroidism, unspecified type  E03.9 CBC with Differential/Platelet    Comprehensive metabolic panel    Lipid panel    TSH    Hemoglobin A1c    5. Fasting hyperglycemia  R73.01 CBC with Differential/Platelet    Comprehensive metabolic panel    Lipid panel    TSH    Hemoglobin A1c    6. Hyperlipidemia, unspecified hyperlipidemia type  E78.5 CBC with Differential/Platelet    Comprehensive metabolic panel    Lipid panel    TSH    Hemoglobin A1c    Lipoprotein A (LPA)    7. Low vitamin D  level  R79.89 CBC with Differential/Platelet    Comprehensive metabolic panel    TSH    Vitamin D , 25-hydroxy    8. Osteoporosis without current pathological fracture, unspecified osteoporosis type  M81.0 Vitamin D , 25-hydroxy  Update lab and meds . FU with her [facility] prescriber. Monitor scaly area leg    Return in about 1 year (around 07/29/2024) for depending on results, preventive /cpx and medications.  Patient Care Team: Atasha Colebank, Apolinar POUR, MD as PCP - Diedre Geraldene Loving, MD (Ophthalmology) Donnald Charleston, MD as Consulting Physician (Gastroenterology) Rhys Verneita Moose, Leim DEL, MD as Consulting Physician (Cardiology) Estelle Service, MD as Consulting Physician (Obstetrics and Gynecology) Patient Instructions  Good to see you today , Update fasting lab to include Vit D level  Flu vaccine today  Can consider rsv vaccine with your history of respiratory problems  but not compelling reason.   Monaye Blackie K. Dontrel Smethers M.D.

## 2023-07-30 ENCOUNTER — Ambulatory Visit: Payer: BC Managed Care – PPO | Admitting: Physician Assistant

## 2023-07-30 ENCOUNTER — Encounter: Payer: Self-pay | Admitting: Physician Assistant

## 2023-07-30 ENCOUNTER — Encounter: Payer: Self-pay | Admitting: Internal Medicine

## 2023-07-30 ENCOUNTER — Ambulatory Visit: Payer: BC Managed Care – PPO | Admitting: Internal Medicine

## 2023-07-30 VITALS — BP 126/60 | HR 68 | Temp 98.3°F | Ht 61.0 in | Wt 137.0 lb

## 2023-07-30 DIAGNOSIS — F319 Bipolar disorder, unspecified: Secondary | ICD-10-CM

## 2023-07-30 DIAGNOSIS — F411 Generalized anxiety disorder: Secondary | ICD-10-CM

## 2023-07-30 DIAGNOSIS — G2401 Drug induced subacute dyskinesia: Secondary | ICD-10-CM

## 2023-07-30 DIAGNOSIS — G2571 Drug induced akathisia: Secondary | ICD-10-CM

## 2023-07-30 DIAGNOSIS — E039 Hypothyroidism, unspecified: Secondary | ICD-10-CM | POA: Diagnosis not present

## 2023-07-30 DIAGNOSIS — Z23 Encounter for immunization: Secondary | ICD-10-CM

## 2023-07-30 DIAGNOSIS — R7989 Other specified abnormal findings of blood chemistry: Secondary | ICD-10-CM

## 2023-07-30 DIAGNOSIS — M810 Age-related osteoporosis without current pathological fracture: Secondary | ICD-10-CM | POA: Diagnosis not present

## 2023-07-30 DIAGNOSIS — Z0000 Encounter for general adult medical examination without abnormal findings: Secondary | ICD-10-CM

## 2023-07-30 DIAGNOSIS — Z79899 Other long term (current) drug therapy: Secondary | ICD-10-CM

## 2023-07-30 DIAGNOSIS — R7301 Impaired fasting glucose: Secondary | ICD-10-CM | POA: Diagnosis not present

## 2023-07-30 DIAGNOSIS — G4700 Insomnia, unspecified: Secondary | ICD-10-CM

## 2023-07-30 DIAGNOSIS — E785 Hyperlipidemia, unspecified: Secondary | ICD-10-CM | POA: Diagnosis not present

## 2023-07-30 LAB — CBC WITH DIFFERENTIAL/PLATELET
Basophils Absolute: 0.1 10*3/uL (ref 0.0–0.1)
Basophils Relative: 0.7 % (ref 0.0–3.0)
Eosinophils Absolute: 0.3 10*3/uL (ref 0.0–0.7)
Eosinophils Relative: 4.1 % (ref 0.0–5.0)
HCT: 42.2 % (ref 36.0–46.0)
Hemoglobin: 14.2 g/dL (ref 12.0–15.0)
Lymphocytes Relative: 28.5 % (ref 12.0–46.0)
Lymphs Abs: 2.4 10*3/uL (ref 0.7–4.0)
MCHC: 33.7 g/dL (ref 30.0–36.0)
MCV: 93.2 fL (ref 78.0–100.0)
Monocytes Absolute: 0.7 10*3/uL (ref 0.1–1.0)
Monocytes Relative: 8.7 % (ref 3.0–12.0)
Neutro Abs: 5 10*3/uL (ref 1.4–7.7)
Neutrophils Relative %: 58 % (ref 43.0–77.0)
Platelets: 309 10*3/uL (ref 150.0–400.0)
RBC: 4.52 Mil/uL (ref 3.87–5.11)
RDW: 12.8 % (ref 11.5–15.5)
WBC: 8.6 10*3/uL (ref 4.0–10.5)

## 2023-07-30 LAB — COMPREHENSIVE METABOLIC PANEL
ALT: 41 U/L — ABNORMAL HIGH (ref 0–35)
AST: 30 U/L (ref 0–37)
Albumin: 4.8 g/dL (ref 3.5–5.2)
Alkaline Phosphatase: 36 U/L — ABNORMAL LOW (ref 39–117)
BUN: 19 mg/dL (ref 6–23)
CO2: 24 meq/L (ref 19–32)
Calcium: 10.6 mg/dL — ABNORMAL HIGH (ref 8.4–10.5)
Chloride: 106 meq/L (ref 96–112)
Creatinine, Ser: 1.01 mg/dL (ref 0.40–1.20)
GFR: 59.15 mL/min — ABNORMAL LOW (ref >60.00)
Glucose, Bld: 78 mg/dL (ref 70–99)
Potassium: 4.7 meq/L (ref 3.5–5.1)
Sodium: 141 meq/L (ref 135–145)
Total Bilirubin: 0.5 mg/dL (ref 0.2–1.2)
Total Protein: 7 g/dL (ref 6.0–8.3)

## 2023-07-30 LAB — HEMOGLOBIN A1C: Hgb A1c MFr Bld: 6.1 % (ref 4.6–6.5)

## 2023-07-30 LAB — LIPID PANEL
Cholesterol: 168 mg/dL (ref 0–200)
HDL: 53.6 mg/dL (ref >39.00)
LDL Cholesterol: 80 mg/dL (ref 0–99)
NonHDL: 114.27
Total CHOL/HDL Ratio: 3
Triglycerides: 171 mg/dL — ABNORMAL HIGH (ref 0.0–149.0)
VLDL: 34.2 mg/dL (ref 0.0–40.0)

## 2023-07-30 LAB — VITAMIN D 25 HYDROXY (VIT D DEFICIENCY, FRACTURES): VITD: 55.19 ng/mL (ref 30.00–100.00)

## 2023-07-30 LAB — TSH: TSH: 1.06 u[IU]/mL (ref 0.35–5.50)

## 2023-07-30 NOTE — Patient Instructions (Addendum)
 Good to see you today , Update fasting lab to include Vit D level  Flu vaccine today  Can consider rsv vaccine with your history of respiratory problems  but not compelling reason.

## 2023-07-30 NOTE — Progress Notes (Signed)
 Vit d in  acceptable range Thyroid  in range borderline liver alt and calcium  elevation as in past 10.6  Cbc nl LIPID ok x borderline elevated TG  intensify diet and activity interventions  Blood sugar normal  A1c 6.1 mild elevation but no diabetes .  Will follow . No change in medication dosage advised  Check lfts bmp in 3-4 months  to ensure stability

## 2023-07-30 NOTE — Progress Notes (Signed)
 [facility]  Patient ID: Lisa Lee,  MRN: [id number]  PCP: Charlett Apolinar POUR, MD  Date of Evaluation: 07/30/2023 Time spent:30 minutes  Chief Complaint:  Chief Complaint   Depression; Anxiety; Follow-up    HISTORY/CURRENT STATUS: For routine med check.  Accompanied by her husband Oneil.  She weaned off Latuda  a week ago.  She felt like that was probably causing the abnormal movements.  She ended up taking it 3 times a week for a couple of weeks and then stopped it on Christmas day.  The movements in her legs and trunk did not start until she was put on Ingrezza  for TD several months back.  I had hoped she would be able to see Dr. Evonnie, but she felt like the issue was medication related and Donny should continue follow-up here.  She and her husband both feel like the movements in her legs are less intense.  She still has the abnormal truncal movements, she does not really even notice it and has no pain shortness of breath or any other symptoms.  Her husband feels like it might be lessened as well.  She only took the mirtazapine  1 time, 7.5 mg and it did not seem to help the movements and made her tired.  She is taking the Xanax  in the evenings, unsure if it is really helping the movement at all but she takes it to relax and sleep.  Patient is able to enjoy things.  Energy and motivation are good.  Work is going well.  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  No extreme sadness, tearfulness, or feelings of hopelessness.  ADLs and personal hygiene are normal.  She has no problems eating, using utensils completely normal.  Denies any changes in concentration, making decisions, or remembering things.  Denies suicidal or homicidal thoughts.  Patient denies increased energy with decreased need for sleep, increased talkativeness, racing thoughts, impulsivity or risky behaviors, increased spending, increased libido, grandiosity, increased irritability or anger, paranoia,  or hallucinations.  Review of Systems  Constitutional: Negative.   HENT: Negative.    Eyes: Negative.   Respiratory: Negative.    Cardiovascular: Negative.   Gastrointestinal: Negative.   Genitourinary: Negative.   Musculoskeletal: Negative.   Skin: Negative.   Neurological:        See HPI  Endo/Heme/Allergies: Negative.   Psychiatric/Behavioral:         See HPI   Individual Medical History/ Review of Systems: Changes? :No    Past medications for mental health diagnoses include: Lexapro , lithium , Xanax , Modafanil, Ingrezza  caused depression and sedation  Allergies: Penicillins, Ingrezza  [valbenazine  tosylate], Pravastatin , and Rosuvastatin   Current Medications:  Current Outpatient Medications:    albuterol  (PROAIR  HFA) 108 (90 Base) MCG/ACT inhaler, Inhale 2 puffs into the lungs every 6 (six) hours as needed for wheezing., Disp: 1 each, Rfl: 1   alendronate  (FOSAMAX ) 70 MG tablet, Take 1 tablet (70 mg total) by mouth every 7 (seven) days. Take with a full glass of water on an empty stomach., Disp: 12 tablet, Rfl: 3   ALPRAZolam  (XANAX ) 0.5 MG tablet, Take 0.5-1 tablets (0.25-0.5 mg total) by mouth 2 (two) times daily as needed for anxiety., Disp: 60 tablet, Rfl: 1   aspirin EC 81 MG tablet, Take 1 tablet (81 mg total) by mouth daily., Disp: , Rfl:    atorvastatin  (LIPITOR) 10 MG tablet, Take 1 tablet (10 mg total) by mouth at bedtime., Disp: 90 tablet, Rfl: 4   Cholecalciferol (  VITAMIN D ) 2000 units tablet, Take 2,000 Units by mouth daily., Disp: , Rfl:    doxylamine, Sleep, (UNISOM) 25 MG tablet, Take 25 mg by mouth at bedtime., Disp: , Rfl:    escitalopram  (LEXAPRO ) 20 MG tablet, Take 1 tablet (20 mg total) by mouth daily., Disp: 90 tablet, Rfl: 1   estradiol  (ESTRACE) 0.1 MG/GM vaginal cream, Place vaginally., Disp: , Rfl:    ezetimibe  (ZETIA ) 10 MG tablet, Take 1 tablet (10 mg total) by mouth daily., Disp: 90 tablet, Rfl: 4   fenofibrate  160 MG tablet, Take 1 tablet (160 mg  total) by mouth at bedtime., Disp: 60 tablet, Rfl: 2   levothyroxine  (SYNTHROID ) 75 MCG tablet, TAKE 1 TABLET BY MOUTH EVERY DAY BEFORE breakfast, Disp: 90 tablet, Rfl: 3   loperamide (IMODIUM A-D) 2 MG tablet, Take 2 mg by mouth as needed., Disp: , Rfl:    Multiple Vitamins-Minerals (MULTIVITAMIN WITH MINERALS) tablet, Take 1 tablet by mouth daily., Disp: , Rfl:    OVER THE COUNTER MEDICATION, Calcium  supplement 600mg  with vitamin D  500 units- BID, Disp: , Rfl:    pantoprazole  (PROTONIX ) 40 MG tablet, Take 1 tablet (40 mg total) by mouth 2 (two) times daily before a meal., Disp: 60 tablet, Rfl: 1   valACYclovir  (VALTREX ) 500 MG tablet, TAKE 1 TABLET DAILY FOR    SUPPRESSION AND 1 TABLET   TWO TIMES A DAY FOR 3 DAYS FOR OUTBREAK Strength: 500 mg, Disp: 90 tablet, Rfl: 3  Current Facility-Administered Medications:    0.9 %  sodium chloride  infusion, 500 mL, Intravenous, Once, Armbruster, Elspeth SQUIBB, MD Medication Side Effects: none  Family Medical/ Social History: Changes?  none  MENTAL HEALTH EXAM:  Last menstrual period 12/09/2013.There is no height or weight on file to calculate BMI.  General Appearance: Casual and Well Groomed  Eye Contact:  Good  Speech:  Clear and Coherent and Normal Rate  Volume:  Normal  Mood:  Euthymic  Affect:  Congruent  Thought Process:  Goal Directed and Descriptions of Associations: Circumstantial  Orientation:  Full (Time, Place, and Person)  Thought Content: Logical   Suicidal Thoughts:  No  Homicidal Thoughts:  No  Memory:  WNL  Judgement:  Good  Insight:  Good  Psychomotor Activity:   Abnormal truncal movements and moving her legs often mostly at her knees down  Concentration:  Concentration: Good and Attention Span: Good  Recall:  Good  Fund of Knowledge: Good  Language: Good  Assets:  Desire for Improvement Financial Resources/Insurance Housing Transportation Vocational/Educational  ADL's:  Intact  Cognition: WNL  Prognosis:  Good   AIMS     Flowsheet Row Office Visit from 07/30/2023 in [facility] Office Visit from 06/21/2023 in [facility] Office Visit from 05/21/2023 in [facility]  AIMS Total Score 7 18 17       GAD-7    Flowsheet Row Office Visit from 07/09/2023 in [facility] [place]  Total GAD-7 Score 4      PHQ2-9    Flowsheet Row Office Visit from 07/09/2023 in [facility] [place] Office Visit from 04/23/2022 in [facility] [place]  PHQ-2 Total Score 0 0  PHQ-9 Total Score 3 2      Flowsheet Row Admission (Discharged) from 03/16/2022 in [facility]  C-SSRS RISK CATEGORY No Risk      DIAGNOSES:    ICD-10-CM   1. Tardive dyskinesia  G24.01  2. Akathisia  G25.71     3. Bipolar I disorder (HCC)  F31.9     4. Generalized anxiety disorder  F41.1     5. Insomnia, unspecified type  G47.00       Receiving Psychotherapy: No   RECOMMENDATIONS:  PDMP was reviewed.  Last Xanax  filled 02/22/2023. I provided 30 minutes of face to face time during this encounter, including time spent before and after the visit in records review, medical decision making, counseling pertinent to today's visit, and charting.   We discussed the movements.  I will consult with Dr. Geoffry, I am hopeful that the movements will disappear after she has been off Latuda , maybe 3 to 4 months, sometimes even 6.  I am not sure how the Ingrezza  brought the movements out.  For now we decided to hold off on the mirtazapine  since she has only taken it 1 time anyway.  I have encouraged her to take the Xanax  as it will help with the anxiety and movements.  Continue Xanax  0.5 mg, 1/2-1 p.o. twice daily as needed. Continue Lexapro  20 mg, 1 p.o. every morning. Return in 6 weeks.  Verneita Cooks, PA-C

## 2023-08-02 LAB — LIPOPROTEIN A (LPA): Lipoprotein (a): 273 nmol/L — ABNORMAL HIGH (ref <75)

## 2023-08-04 NOTE — Progress Notes (Signed)
 Lipo A is quite high  and a good reason to be on statin medication  will forward to cardiology and they  advise  intensifying  dosing .

## 2023-08-05 ENCOUNTER — Encounter: Payer: Self-pay | Admitting: Internal Medicine

## 2023-08-06 ENCOUNTER — Encounter: Payer: Self-pay | Admitting: Internal Medicine

## 2023-08-07 ENCOUNTER — Encounter: Payer: Self-pay | Admitting: Internal Medicine

## 2023-08-07 MED ORDER — ATORVASTATIN CALCIUM 40 MG PO TABS: 40.0000 mg | ORAL_TABLET | Freq: Every day | ORAL | 3 refills | Status: DC

## 2023-08-08 ENCOUNTER — Telehealth: Payer: Self-pay | Admitting: Physician Assistant

## 2023-08-08 NOTE — Telephone Encounter (Signed)
 I called pt to discuss TD which was initially tx with Ingrezza . (See PN for full details.) I discussed case w/ Dr. Geoffry. He recommended Austedo, if the movements were still bothering her, there are more doses from which to choose than with Ingrezza . Pt states the move months in her face and trunk are almost completely gone now.  She still has some abnormal twitching of her legs from time to time but they are improving.  She does not want to add Austedo.  We also discussed her mood.  She states it is fine right now on the current treatment.  States depression is controlled and she is not having any manic symptoms except for not being able to fall asleep well.  She feels tired the next day, does not report endless energy.  She thinks the Latuda  may have been helping her sleep and since she is no longer on that she does not get tired and does not feel like going to bed.  She takes Xanax  to help her sleep which is effective.  She understands that a mood stabilizer or antipsychotic are standard of care for bipolar disorder.  Even though her mood is stable right now, not taking 1 of those medications increases the risk of mania and being on an antidepressant increases the risk for mood cycling.  She understands and states I am not inclined to go on any other medication right now.  She

## 2023-08-20 NOTE — Progress Notes (Unsigned)
Cardiology Office Note:   Date:  08/21/2023  ID:  Lisa Lee, DOB [birth year], MRN [id number] PCP:  Madelin Headings, MD  [facility] Providers Cardiologist:  Alverda Skeans, MD Referring MD: Madelin Headings, MD  Chief Complaint/Reason for Referral: Cardiology follow-up ASSESSMENT:    1. Mild coronary artery disease   2. Hyperlipidemia LDL goal <70   3. Aortic atherosclerosis (HCC)   4. Elevated lipoprotein(a)   5. Stage 3a chronic kidney disease (HCC)   6. Precordial pain   7. Hyperlipidemia, unspecified hyperlipidemia type     PLAN:   In order of problems listed above: Mild coronary artery disease: Continue aspirin and statin. Hyperlipidemia: Patient's goal LDL less than 70 and given LP(a) may be less than 55.  Will check lipid panel and LFTs in 2 months.  She is tolerating atorvastatin 40 mg.  If she is unable to tolerate this medication moving forward or her LDL is not at goal then I will refer her to pharmacy. Aortic atherosclerosis: See discussion above. Elevated LP(a): See #2 above. Stage III chronic kidney disease: Monitor for now.            Dispo:  Return in about 1 year (around 08/20/2024).      Medication Adjustments/Labs and Tests Ordered: Current medicines are reviewed at length with the patient today.  Concerns regarding medicines are outlined above.  The following changes have been made:  no change   Labs/tests ordered: Orders Placed This Encounter  Procedures   EKG 12-Lead    Medication Changes: No orders of the defined types were placed in this encounter.   Current medicines are reviewed at length with the patient today.  The patient does not have concerns regarding medicines.  I spent 32 minutes reviewing all clinical data during and prior to this visit including all relevant imaging studies, laboratories, clinical information from other health systems and prior notes from both Cardiology and other specialties, interviewing the  patient, conducting a complete physical examination, and coordinating care in order to formulate a comprehensive and personalized evaluation and treatment plan.   History of Present Illness:      FOCUSED PROBLEM LIST:   CAD Mild nonobstructive coronary CTA 2022 Hyperlipidemia LP(a) 273 Intolerant of statins; tolerating Lipitor 40 mg Aortic atherosclerosis Chest CT 2017 Hypothyroidism CKD stage IIIa BMI 26  1/25: The patient returns for annual cardiology follow-up.  When she was seen 1 year ago she was doing well.  She did have a lipid panel drawn last month which showed an LDL of 80 and highly elevated LP(a).  She had her Lipitor increased to 40 mg despite having issues with statin intolerance in the past.  She seems to be tolerating this increased dose of the medication.  She is free of cardiovascular complaints today.  She does not smoke or drink.  She is required no emergency room visits or hospitalizations.    She is quite concerned about her family history and multiple family members have developed coronary artery disease.  She is open to alternative medications in case her LDL is not at goal in the near future.         Current Medications: Current Meds  Medication Sig   albuterol (PROAIR HFA) 108 (90 Base) MCG/ACT inhaler Inhale 2 puffs into the lungs every 6 (six) hours as needed for wheezing.   alendronate (FOSAMAX) 70 MG tablet Take 1 tablet (70 mg total) by mouth every 7 (seven) days. Take with a full glass of  water on an empty stomach.   ALPRAZolam (XANAX) 0.5 MG tablet Take 0.5-1 tablets (0.25-0.5 mg total) by mouth 2 (two) times daily as needed for anxiety.   aspirin EC 81 MG tablet Take 1 tablet (81 mg total) by mouth daily.   atorvastatin (LIPITOR) 40 MG tablet Take 1 tablet (40 mg total) by mouth daily.   Cholecalciferol (VITAMIN D) 2000 units tablet Take 2,000 Units by mouth daily.   escitalopram (LEXAPRO) 20 MG tablet Take 1 tablet (20 mg total) by mouth daily.    estradiol (ESTRACE) 0.1 MG/GM vaginal cream Place vaginally.   ezetimibe (ZETIA) 10 MG tablet Take 1 tablet (10 mg total) by mouth daily.   fenofibrate 160 MG tablet Take 1 tablet (160 mg total) by mouth at bedtime.   levothyroxine (SYNTHROID) 75 MCG tablet TAKE 1 TABLET BY MOUTH EVERY DAY BEFORE breakfast   loperamide (IMODIUM A-D) 2 MG tablet Take 2 mg by mouth as needed.   Multiple Vitamins-Minerals (MULTIVITAMIN WITH MINERALS) tablet Take 1 tablet by mouth daily.   OVER THE COUNTER MEDICATION Calcium supplement 600mg  with vitamin D 500 units- BID   pantoprazole (PROTONIX) 40 MG tablet Take 1 tablet (40 mg total) by mouth 2 (two) times daily before a meal.   valACYclovir (VALTREX) 500 MG tablet TAKE 1 TABLET DAILY FOR    SUPPRESSION AND 1 TABLET   TWO TIMES A DAY FOR 3 DAYS FOR OUTBREAK Strength: 500 mg   Current Facility-Administered Medications for the 08/21/23 encounter (Office Visit) with Orbie Pyo, MD  Medication   0.9 %  sodium chloride infusion     Review of Systems:   Please see the history of present illness.    All other systems reviewed and are negative.     EKGs/Labs/Other Test Reviewed:   EKG:   EKG Interpretation Date/Time:  Wednesday August 21 2023 15:22:30 EST Ventricular Rate:  62 PR Interval:  142 QRS Duration:  86 QT Interval:  410 QTC Calculation: 416 R Axis:   36  Text Interpretation: Normal sinus rhythm Normal ECG When compared with ECG of 11-May-2014 20:15, No significant change was found Confirmed by Alverda Skeans ([id number]) on 08/21/2023 3:48:44 PM         Risk Assessment/Calculations:          Physical Exam:   VS:  BP 110/62   Pulse 62   Ht 5\' 1"  (1.549 m)   Wt 134 lb (60.8 kg)   LMP 12/09/2013   SpO2 97%   BMI 25.32 kg/m        Wt Readings from Last 3 Encounters:  08/21/23 134 lb (60.8 kg)  07/30/23 137 lb (62.1 kg)  07/22/23 137 lb (62.1 kg)      GENERAL:  No apparent distress, AOx3 HEENT:  No carotid bruits, +2 carotid  impulses, no scleral icterus CAR: RRR no murmurs, gallops, rubs, or thrills RES:  Clear to auscultation bilaterally ABD:  Soft, nontender, nondistended, positive bowel sounds x 4 VASC:  +2 radial pulses, +2 carotid pulses NEURO:  CN 2-12 grossly intact; motor and sensory grossly intact PSYCH:  No active depression or anxiety EXT:  No edema, ecchymosis, or cyanosis  Signed, Orbie Pyo, MD  08/21/2023 4:00 PM    [facility] [address] [place], [place], [place]  [address] Phone: [phone]; Fax: [phone]   Note:  This document was prepared using Dragon voice recognition software and may include unintentional dictation errors.

## 2023-08-21 ENCOUNTER — Encounter: Payer: Self-pay | Admitting: Internal Medicine

## 2023-08-21 ENCOUNTER — Ambulatory Visit: Payer: Self-pay | Attending: Internal Medicine | Admitting: Internal Medicine

## 2023-08-21 VITALS — BP 110/62 | HR 62 | Ht 61.0 in | Wt 134.0 lb

## 2023-08-21 DIAGNOSIS — I700 Atherosclerosis of aorta: Secondary | ICD-10-CM

## 2023-08-21 DIAGNOSIS — E7841 Elevated Lipoprotein(a): Secondary | ICD-10-CM | POA: Diagnosis not present

## 2023-08-21 DIAGNOSIS — I2510 Atherosclerotic heart disease of native coronary artery without angina pectoris: Secondary | ICD-10-CM | POA: Diagnosis not present

## 2023-08-21 DIAGNOSIS — E785 Hyperlipidemia, unspecified: Secondary | ICD-10-CM

## 2023-08-21 DIAGNOSIS — R072 Precordial pain: Secondary | ICD-10-CM

## 2023-08-21 DIAGNOSIS — N1831 Chronic kidney disease, stage 3a: Secondary | ICD-10-CM

## 2023-08-21 MED ORDER — FENOFIBRATE 160 MG PO TABS: 160.0000 mg | ORAL_TABLET | Freq: Every day | ORAL | 3 refills | Status: DC

## 2023-08-21 MED ORDER — EZETIMIBE 10 MG PO TABS: 10.0000 mg | ORAL_TABLET | Freq: Every day | ORAL | 3 refills | Status: DC

## 2023-08-21 NOTE — Patient Instructions (Signed)
Medication Instructions:  No changes today *If you need a refill on your cardiac medications before your next appointment, please call your pharmacy*   Lab Work: Go to [facility] in about 2 months for blood work (lipids and liver function)   Testing/Procedures: none   Follow-Up: At [facility], you and your health needs are our priority.  As part of our continuing mission to provide you with exceptional heart care, we have created designated Provider Care Teams.  These Care Teams include your primary Cardiologist (physician) and Advanced Practice Providers (APPs -  Physician Assistants and Nurse Practitioners) who all work together to provide you with the care you need, when you need it.   Your next appointment:   12 month(s)  Provider:   Alverda Skeans, MD    1st Floor: - Lobby - Registration  - Pharmacy  - Lab - Cafe  2nd Floor: - PV Lab - Diagnostic Testing (echo, CT, nuclear med)  3rd Floor: - Vacant  4th Floor: - TCTS (cardiothoracic surgery) - AFib Clinic - Structural Heart Clinic - Vascular Surgery  - Vascular Ultrasound  5th Floor: - HeartCare Cardiology (general and EP) - Clinical Pharmacy for coumadin, hypertension, lipid, weight-loss medications, and med management appointments    Valet parking services will be available as well.

## 2023-08-22 ENCOUNTER — Encounter: Payer: Self-pay | Admitting: Internal Medicine

## 2023-08-22 NOTE — Telephone Encounter (Signed)
Contacted pt. Virtual appt schedule for 09/04/2023 at 9:15am.

## 2023-08-23 ENCOUNTER — Encounter: Payer: Self-pay | Admitting: Internal Medicine

## 2023-09-02 ENCOUNTER — Other Ambulatory Visit: Payer: Self-pay | Admitting: Physician Assistant

## 2023-09-02 NOTE — Telephone Encounter (Signed)
Lf 1/6

## 2023-09-03 NOTE — Progress Notes (Signed)
 Virtual Visit via Video Note  I connected with Lisa Lee on 09/04/23 at  9:15 AM EST by a video enabled telemedicine application and verified that I am speaking with the correct person using two identifiers. Location patient: home Location provider:work office Persons participating in the virtual visit: patient, provider   Patient aware  of the limitations of evaluation and management by telemedicine and  availability of in person appointments. and agreed to proceed.   HPI: Lisa Lee presents for video visit  2 issues   Nausea : no vomiting for a few weeks  hard to eat  no change bowel habits   only other change atorva 40 increase .[place]  no there  change  may have had pain one time otherwise ok   Egfr in ckd 3 range ..  cr 0.9 - 1 range although has been higher  .  No fam hx renal disease . Currently not on ace arb  or need for such . No dm but agree  get mor information  to check for active kidney disease.  Cards had advised fu .  ROS: See pertinent positives and negatives per HPI.  Past Medical History:  Diagnosis Date   Acid reflux    Asthma    Bipolar disorder (HCC)    Depression    Diarrhea 08/09/2011   Poss ibs  Vs other  Related to stress  consdier other eval if needed. Had colonoscoopy per dr Donnald    High cholesterol    History of hypertension    no meds for last year  per pt on 03-08-2022   HPV in female 1984   Hypothyroidism    PMB (postmenopausal bleeding)    Thyroid  disease     Past Surgical History:  Procedure Laterality Date   CERVICAL CONIZATION W/BX N/A 07/21/2015   Procedure: CONIZATION CERVIX WITH BIOPSY;  Surgeon: Rome Rigg, MD;  Location: [facility];  Service: Gynecology;  Laterality: N/A;   CERVICAL CONIZATION W/BX N/A 09/25/2018   Procedure: CONIZATION CERVIX WITH BIOPSY;  Surgeon: Eloy Herring, MD;  Location: [facility];  Service: Gynecology;  Laterality: N/A;   CERVICAL CRYOTHERAPY  07/30/1982    colonscopy  07/04/2018   COLPOSCOPY  08/26/2013   DILATATION & CURETTAGE/HYSTEROSCOPY WITH MYOSURE N/A 03/16/2022   Procedure: ATTEMPTED DILATATION & CURETTAGE/ATTEMPTED HYSTEROSCOPY WITH MYOSURE, POLYPECTOMY;  Surgeon: Estelle Service, MD;  Location: [facility];  Service: Gynecology;  Laterality: N/A;   DILATION AND CURETTAGE, DIAGNOSTIC / THERAPEUTIC  07/30/1992   Blighted Ovum   HYSTEROSCOPY     uterine polypectomy Jan 7th   HYSTEROSCOPY WITH RESECTOSCOPE  08/04/2009   Removed polyp & IUD   LYMPH NODE BIOPSY     40 yrs ago   OPERATIVE ULTRASOUND N/A 03/16/2022   Procedure: OPERATIVE ULTRASOUND;  Surgeon: Estelle Service, MD;  Location: [facility];  Service: Gynecology;  Laterality: N/A;   TONSILLECTOMY     as child   UPPER GASTROINTESTINAL ENDOSCOPY  01/11/2023   UPPER GI ENDOSCOPY  07/04/2018    Family History  Problem Relation Age of Onset   Arthritis Mother    Heart disease Mother    Rectal cancer Father    Heart disease Father    Colon cancer Father    Parkinson's disease Father    Heart disease Sister 77       cabg stent   Heart disease Sister    Thyroid  disease Sister    Heart disease Brother  Pulmonary embolism Daughter    Thyroid  disease Daughter    Esophageal cancer Neg Hx    Stomach cancer Neg Hx     Social History   Tobacco Use   Smoking status: Never   Smokeless tobacco: Never  Vaping Use   Vaping status: Never Used  Substance Use Topics   Alcohol use: Yes    Alcohol/week: 0.0 standard drinks of alcohol    Comment: monthly ocassional drink   Drug use: No      Current Outpatient Medications:    albuterol  (PROAIR  HFA) 108 (90 Base) MCG/ACT inhaler, Inhale 2 puffs into the lungs every 6 (six) hours as needed for wheezing., Disp: 1 each, Rfl: 1   alendronate  (FOSAMAX ) 70 MG tablet, Take 1 tablet (70 mg total) by mouth every 7 (seven) days. Take with a full glass of water on an empty stomach., Disp: 12 tablet,  Rfl: 3   ALPRAZolam  (XANAX ) 0.5 MG tablet, TAKE 1/2 TO 1 TABLET BY MOUTH TWICE DAILY AS NEEDED FOR ANXIETY, Disp: 60 tablet, Rfl: 1   aspirin EC 81 MG tablet, Take 1 tablet (81 mg total) by mouth daily., Disp: , Rfl:    atorvastatin  (LIPITOR) 40 MG tablet, Take 1 tablet (40 mg total) by mouth daily. (Patient taking differently: Take 40 mg by mouth daily. Taking atorvastatin  10mg ), Disp: 90 tablet, Rfl: 3   Cholecalciferol (VITAMIN D ) 2000 units tablet, Take 2,000 Units by mouth daily., Disp: , Rfl:    escitalopram  (LEXAPRO ) 20 MG tablet, Take 1 tablet (20 mg total) by mouth daily., Disp: 90 tablet, Rfl: 1   estradiol  (ESTRACE) 0.1 MG/GM vaginal cream, Place vaginally., Disp: , Rfl:    ezetimibe  (ZETIA ) 10 MG tablet, Take 1 tablet (10 mg total) by mouth daily., Disp: 90 tablet, Rfl: 3   fenofibrate  160 MG tablet, Take 1 tablet (160 mg total) by mouth at bedtime., Disp: 90 tablet, Rfl: 3   levothyroxine  (SYNTHROID ) 75 MCG tablet, TAKE 1 TABLET BY MOUTH EVERY DAY BEFORE breakfast, Disp: 90 tablet, Rfl: 3   loperamide (IMODIUM A-D) 2 MG tablet, Take 2 mg by mouth as needed., Disp: , Rfl:    Multiple Vitamins-Minerals (MULTIVITAMIN WITH MINERALS) tablet, Take 1 tablet by mouth daily., Disp: , Rfl:    OVER THE COUNTER MEDICATION, Calcium  supplement 600mg  with vitamin D  500 units- BID, Disp: , Rfl:    pantoprazole  (PROTONIX ) 40 MG tablet, Take 1 tablet (40 mg total) by mouth 2 (two) times daily before a meal., Disp: 60 tablet, Rfl: 1   valACYclovir  (VALTREX ) 500 MG tablet, TAKE 1 TABLET DAILY FOR    SUPPRESSION AND 1 TABLET   TWO TIMES A DAY FOR 3 DAYS FOR OUTBREAK Strength: 500 mg, Disp: 90 tablet, Rfl: 3  Current Facility-Administered Medications:    0.9 %  sodium chloride  infusion, 500 mL, Intravenous, Once, Armbruster, Elspeth SQUIBB, MD  EXAM: BP Readings from Last 3 Encounters:  08/21/23 110/62  07/30/23 126/60  07/22/23 (!) 93/57    VITALS per patient if applicable:  GENERAL: alert, oriented,  appears well and in no acute distress  HEENT: atraumatic, conjunttiva clear, no obvious abnormalities on inspection of external nose and ears  NECK: normal movements of the head and neck  LUNGS: on inspection no signs of respiratory distress, breathing rate appears normal, no obvious gross SOB, gasping or wheezing  CV: no obvious cyanosis  MS: moves all visible extremities without noticeable abnormality  PSYCH/NEURO: pleasant and cooperative, no obvious depression or anxiety, speech and  thought processing grossly intact Lab Results  Component Value Date   WBC 8.6 07/30/2023   HGB 14.2 07/30/2023   HCT 42.2 07/30/2023   PLT 309.0 07/30/2023   GLUCOSE 78 07/30/2023   CHOL 168 07/30/2023   TRIG 171.0 (H) 07/30/2023   HDL 53.60 07/30/2023   LDLDIRECT 101.0 10/30/2016   LDLCALC 80 07/30/2023   ALT 41 (H) 07/30/2023   AST 30 07/30/2023   NA 141 07/30/2023   K 4.7 07/30/2023   CL 106 07/30/2023   CREATININE 1.01 07/30/2023   BUN 19 07/30/2023   CO2 24 07/30/2023   TSH 1.06 07/30/2023   HGBA1C 6.1 07/30/2023    ASSESSMENT AND PLAN:  Discussed the following assessment and plan:    ICD-10-CM   1. Decreased calculated glomerular filtration rate (GFR)  R94.4 Cystatin C with Glomerular Filtration Rate, Estimated (eGFR)    Basic metabolic panel    Hepatic function panel    Microalbumin / creatinine urine ratio    Urine Microscopic Only   category  ckd3 further infor mation to be obtained    2. Nausea  R11.0 Cystatin C with Glomerular Filtration Rate, Estimated (eGFR)    Basic metabolic panel    Hepatic function panel    Microalbumin / creatinine urine ratio    Urine Microscopic Only    3. Medication management  Z79.899 Cystatin C with Glomerular Filtration Rate, Estimated (eGFR)    Basic metabolic panel    Hepatic function panel    Microalbumin / creatinine urine ratio    Urine Microscopic Only     Check cystatin and urine protein w micro   if not done   consider  imaging  if not done  gfr is calculated   meds  was on lithium  in past and stopped  no ref nsaids   no dm dx   Nausea  seems to related to  inc atorva to 40 mg per day  and better with dec  Can retry at 10 per day and inc slowly   20  and then 40 as tolerated.   Consider other eval if  persistent or progressive   Consider  abd /renal us  if indicated   Counseled.  Plan fu depending labs and how doing   Expectant management and discussion of plan and treatment with opportunity to ask questions and all were answered. The patient agreed with the plan and demonstrated an understanding of the instructions.   Advised to call back or seek an in-person evaluation if worsening  or having  further concerns  in interim. Return for depending on results.   Apolinar Eastern, MD

## 2023-09-04 ENCOUNTER — Encounter: Payer: Self-pay | Admitting: Internal Medicine

## 2023-09-04 ENCOUNTER — Telehealth (INDEPENDENT_AMBULATORY_CARE_PROVIDER_SITE_OTHER): Payer: 59 | Admitting: Internal Medicine

## 2023-09-04 VITALS — HR 64 | Wt 129.0 lb

## 2023-09-04 DIAGNOSIS — R110 Nausea: Secondary | ICD-10-CM

## 2023-09-04 DIAGNOSIS — Z79899 Other long term (current) drug therapy: Secondary | ICD-10-CM

## 2023-09-04 DIAGNOSIS — R944 Abnormal results of kidney function studies: Secondary | ICD-10-CM

## 2023-09-06 ENCOUNTER — Other Ambulatory Visit (INDEPENDENT_AMBULATORY_CARE_PROVIDER_SITE_OTHER): Payer: 59

## 2023-09-06 DIAGNOSIS — Z79899 Other long term (current) drug therapy: Secondary | ICD-10-CM | POA: Diagnosis not present

## 2023-09-06 DIAGNOSIS — R944 Abnormal results of kidney function studies: Secondary | ICD-10-CM

## 2023-09-06 DIAGNOSIS — R110 Nausea: Secondary | ICD-10-CM | POA: Diagnosis not present

## 2023-09-06 LAB — URINALYSIS, MICROSCOPIC ONLY: RBC / HPF: NONE SEEN (ref 0–?)

## 2023-09-06 LAB — HEPATIC FUNCTION PANEL
ALT: 30 U/L (ref 0–35)
AST: 24 U/L (ref 0–37)
Albumin: 4.5 g/dL (ref 3.5–5.2)
Alkaline Phosphatase: 39 U/L (ref 39–117)
Bilirubin, Direct: 0.1 mg/dL (ref 0.0–0.3)
Total Bilirubin: 0.5 mg/dL (ref 0.2–1.2)
Total Protein: 7 g/dL (ref 6.0–8.3)

## 2023-09-06 LAB — BASIC METABOLIC PANEL
BUN: 21 mg/dL (ref 6–23)
CO2: 25 meq/L (ref 19–32)
Calcium: 10.7 mg/dL — ABNORMAL HIGH (ref 8.4–10.5)
Chloride: 104 meq/L (ref 96–112)
Creatinine, Ser: 1.04 mg/dL (ref 0.40–1.20)
GFR: 57.06 mL/min — ABNORMAL LOW (ref >60.00)
Glucose, Bld: 135 mg/dL — ABNORMAL HIGH (ref 70–99)
Potassium: 3.7 meq/L (ref 3.5–5.1)
Sodium: 141 meq/L (ref 135–145)

## 2023-09-09 ENCOUNTER — Encounter: Payer: Self-pay | Admitting: Internal Medicine

## 2023-09-09 LAB — CYSTATIN C WITH GLOMERULAR FILTRATION RATE, ESTIMATED (EGFR)
CYSTATIN C: 1.53 mg/L — ABNORMAL HIGH (ref 0.52–1.14)
eGFR: 41 mL/min/{1.73_m2} — ABNORMAL LOW (ref >=60)

## 2023-09-10 ENCOUNTER — Encounter: Payer: Self-pay | Admitting: Internal Medicine

## 2023-09-10 NOTE — Progress Notes (Signed)
Cystatin confirms a decrease GFR  urine sediment not active .  No sig protein in urine.  Please order  renal ultrasound   for  ( decreased gfr  ckd . )

## 2023-09-11 ENCOUNTER — Other Ambulatory Visit: Payer: Self-pay

## 2023-09-11 ENCOUNTER — Ambulatory Visit: Payer: 59 | Admitting: Internal Medicine

## 2023-09-11 ENCOUNTER — Encounter: Payer: Self-pay | Admitting: Internal Medicine

## 2023-09-11 VITALS — BP 118/76 | HR 71 | Temp 98.3°F | Ht 61.0 in | Wt 130.0 lb

## 2023-09-11 DIAGNOSIS — Z79899 Other long term (current) drug therapy: Secondary | ICD-10-CM

## 2023-09-11 DIAGNOSIS — R944 Abnormal results of kidney function studies: Secondary | ICD-10-CM

## 2023-09-11 DIAGNOSIS — T887XXA Unspecified adverse effect of drug or medicament, initial encounter: Secondary | ICD-10-CM

## 2023-09-11 DIAGNOSIS — R110 Nausea: Secondary | ICD-10-CM

## 2023-09-11 DIAGNOSIS — F319 Bipolar disorder, unspecified: Secondary | ICD-10-CM

## 2023-09-11 DIAGNOSIS — R634 Abnormal weight loss: Secondary | ICD-10-CM | POA: Diagnosis not present

## 2023-09-11 DIAGNOSIS — R259 Unspecified abnormal involuntary movements: Secondary | ICD-10-CM | POA: Diagnosis not present

## 2023-09-11 NOTE — Telephone Encounter (Signed)
Done today at visit today

## 2023-09-11 NOTE — Patient Instructions (Addendum)
I would  stop the alendronate .  Try off the atorvastatin and the fenofibrate temporarily  asa .  Lets see  how nausea  it feels over 2 weeks. In interim   will do referral back to GI    about sx  and weight loss. And a new  referral to [facility].

## 2023-09-11 NOTE — Progress Notes (Signed)
Chief Complaint  Patient presents with   Nausea    Sx going since 1/21   Bloated   Weight Loss    Pt reports weight loss since last January visit . Pt reports she has no appetite to eat and has to force herself to eat. Noticed sx going on 1/21.    Insomnia    Pt states has trouble sleeping. Pt states she "is not tired anymore"   Follow-up    Pt reports she is still having random involunteer movement.     HPI: Lisa Lee 63 y.o. come in for   ongoing nausea  with aneorexia and weight loss    began after having dental veneer procedure but not sure if real ted  Has lost 7 #  unintended   but hard to eat no vomiting or major change bowel habits  describes high epigastric discomfort and nausa but no spec HB  on ppi   No dysphagia per se  had esophagus dilatation last June?    Severe nause a   no change with zofran x 1  Pscyh meds gave changes and   off latuda  but motor  movement legs not that much better ,  [facility] to be in system for help.  Now sleep is off  Has to take alprazolam. To sleep and not that effective  up all night at times  but no  excess irritability  but not feeling that well .   Other changes  atrova dose increase but back down ROS: See pertinent positives and negatives per HPI.  Past Medical History:  Diagnosis Date   Acid reflux    Asthma    Bipolar disorder (HCC)    Depression    Diarrhea 08/09/2011   Poss ibs  Vs other  Related to stress  consdier other eval if needed. Had colonoscoopy per dr Matthias Hughs    High cholesterol    History of hypertension    no meds for last year  per pt on 03-08-2022   HPV in female 1984   Hypothyroidism    PMB (postmenopausal bleeding)    Thyroid disease     Family History  Problem Relation Age of Onset   Arthritis Mother    Heart disease Mother    Rectal cancer Father    Heart disease Father    Colon cancer Father    Parkinson's disease Father    Heart disease Sister 93        cabg stent   Heart disease Sister    Thyroid disease Sister    Heart disease Brother    Pulmonary embolism Daughter    Thyroid disease Daughter    Esophageal cancer Neg Hx    Stomach cancer Neg Hx     Social History   Socioeconomic History   Marital status: Married    Spouse name: Not on file   Number of children: Not on file   Years of education: Not on file   Highest education level: Bachelor's degree (e.g., BA, AB, BS)  Occupational History   Not on file  Tobacco Use   Smoking status: Never   Smokeless tobacco: Never  Vaping Use   Vaping status: Never Used  Substance and Sexual Activity   Alcohol use: Yes    Alcohol/week: 0.0 standard drinks of alcohol    Comment: monthly ocassional drink   Drug use: No   Sexual activity: Yes    Birth control/protection: None  Other Topics Concern   Not on file  Social History Narrative    [employer]. 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   Pets 2 cats 1 dog to move   Daughter  On recovery heroin   Social Drivers of Corporate investment banker Strain: Low Risk  (07/29/2023)   Overall Financial Resource Strain (CARDIA)    Difficulty of Paying Living Expenses: Not hard at all  Food Insecurity: No Food Insecurity (07/29/2023)   Hunger Vital Sign    Worried About Running Out of Food in the Last Year: Never true    Ran Out of Food in the Last Year: Never true  Transportation Needs: No Transportation Needs (07/29/2023)   PRAPARE - Administrator, Civil Service (Medical): No    Lack of Transportation (Non-Medical): No  Physical Activity: Insufficiently Active (07/29/2023)   Exercise Vital Sign    Days of Exercise per Week: 2 days    Minutes of Exercise per Session: 60 min  Stress: No Stress Concern Present (07/29/2023)   [employer] of Occupational Health - Occupational Stress Questionnaire    Feeling of  Stress : Not at all  Social Connections: Moderately Isolated (07/29/2023)   Social Connection and Isolation Panel [NHANES]    Frequency of Communication with Friends and Family: More than three times a week    Frequency of Social Gatherings with Friends and Family: Once a week    Attends Religious Services: Never    Database administrator or Organizations: No    Attends Engineer, structural: Not on file    Marital Status: Married    Outpatient Medications Prior to Visit  Medication Sig Dispense Refill   albuterol (PROAIR HFA) 108 (90 Base) MCG/ACT inhaler Inhale 2 puffs into the lungs every 6 (six) hours as needed for wheezing. 1 each 1   alendronate (FOSAMAX) 70 MG tablet Take 1 tablet (70 mg total) by mouth every 7 (seven) days. Take with a full glass of water on an empty stomach. 12 tablet 3   ALPRAZolam (XANAX) 0.5 MG tablet TAKE 1/2 TO 1 TABLET BY MOUTH TWICE DAILY AS NEEDED FOR ANXIETY 60 tablet 1   aspirin EC 81 MG tablet Take 1 tablet (81 mg total) by mouth daily.     atorvastatin (LIPITOR) 40 MG tablet Take 1 tablet (40 mg total) by mouth daily. (Patient taking differently: Take 40 mg by mouth daily. Taking atorvastatin 10mg ) 90 tablet 3   Cholecalciferol (VITAMIN D) 2000 units tablet Take 2,000 Units by mouth daily.     escitalopram (LEXAPRO) 20 MG tablet Take 1 tablet (20 mg total) by mouth daily. 90 tablet 1   estradiol (ESTRACE) 0.1 MG/GM vaginal cream Place vaginally.     ezetimibe (ZETIA) 10 MG tablet Take 1 tablet (10 mg total) by mouth daily. 90 tablet 3   fenofibrate 160 MG tablet Take 1 tablet (160 mg total) by mouth at bedtime. 90 tablet 3   levothyroxine (SYNTHROID) 75 MCG tablet TAKE 1 TABLET BY MOUTH EVERY DAY BEFORE breakfast 90 tablet 3   loperamide (IMODIUM A-D) 2 MG tablet Take 2 mg by mouth as needed.     Multiple Vitamins-Minerals (MULTIVITAMIN WITH MINERALS) tablet Take 1 tablet by mouth daily.     OVER THE COUNTER MEDICATION Calcium supplement 600mg   with vitamin D  500 units- BID     pantoprazole (PROTONIX) 40 MG tablet Take 1 tablet (40 mg total) by mouth 2 (two) times daily before a meal. 60 tablet 1   valACYclovir (VALTREX) 500 MG tablet TAKE 1 TABLET DAILY FOR    SUPPRESSION AND 1 TABLET   TWO TIMES A DAY FOR 3 DAYS FOR OUTBREAK Strength: 500 mg 90 tablet 3   Facility-Administered Medications Prior to Visit  Medication Dose Route Frequency Provider Last Rate Last Admin   0.9 %  sodium chloride infusion  500 mL Intravenous Once Armbruster, Willaim Rayas, MD         EXAM:  BP 118/76 (BP Location: Left Arm, Patient Position: Sitting, Cuff Size: Normal)   Pulse 71   Temp 98.3 F (36.8 C) (Oral)   Ht 5\' 1"  (1.549 m)   Wt 130 lb (59 kg)   LMP 12/09/2013   SpO2 98%   BMI 24.56 kg/m   Body mass index is 24.56 kg/m. Wt Readings from Last 3 Encounters:  09/11/23 130 lb (59 kg)  09/04/23 129 lb (58.5 kg)  08/21/23 134 lb (60.8 kg)    GENERAL: vitals reviewed and listed above, alert, oriented, appears well hydrated and in no acute distress HEENT: atraumatic, conjunctiva  clear, no obvious abnormalities on inspection of external nose and ears OP : no lesion edema or exudate  NECK: no obvious masses on inspection palpation  LUNGS: clear to auscultation bilaterally, no wheezes, rales or rhonchi, good air movement CV: HRRR, no clubbing cyanosis or  peripheral edema nl cap refill  MS: moves all extremities without noticeable focal  abnormality PSYCH: pleasant and cooperative, no obvious depression or anxiety Lab Results  Component Value Date   WBC 8.6 07/30/2023   HGB 14.2 07/30/2023   HCT 42.2 07/30/2023   PLT 309.0 07/30/2023   GLUCOSE 135 (H) 09/06/2023   CHOL 168 07/30/2023   TRIG 171.0 (H) 07/30/2023   HDL 53.60 07/30/2023   LDLDIRECT 101.0 10/30/2016   LDLCALC 80 07/30/2023   ALT 30 09/06/2023   AST 24 09/06/2023   NA 141 09/06/2023   K 3.7 09/06/2023   CL 104 09/06/2023   CREATININE 1.04 09/06/2023   BUN 21 09/06/2023    CO2 25 09/06/2023   TSH 1.06 07/30/2023   HGBA1C 6.1 07/30/2023   MICROALBUR <0.7 09/06/2023   BP Readings from Last 3 Encounters:  09/11/23 118/76  08/21/23 110/62  07/30/23 126/60    ASSESSMENT AND PLAN:  Discussed the following assessment and plan:  Nausea - Plan: Ambulatory referral to Gastroenterology  Weight loss - Plan: Ambulatory referral to Gastroenterology  Medication management - Plan: Ambulatory referral to Gastroenterology, Ambulatory referral to Psychiatry  Involuntary movements  Bipolar affective disorder, remission status unspecified (HCC) - Plan: Ambulatory referral to Psychiatry  Medication side effect - Plan: Ambulatory referral to Psychiatry   Stop the  fosomax   incase .  Related  Also the atova fenfibr asa for now  since there was a change before sx onset.  For a few weeks and see  if makes a difference .  Disc poss Carafate but at this point less is best  to  help see cause. Gi referral  appt  BH referral appt .   May have to delay fu lipid assessment  in interim   Do not think related to dec gfr eval   Sen in message about how doing in about 2 weeks and go from there.  -Patient advised to return or  notify health care team  if  new concerns arise.  Patient Instructions  I would  stop the alendronate .  Try off the atorvastatin and the fenofibrate temporarily  asa .  Lets see  how nausea  it feels over 2 weeks. In interim   will do referral back to GI    about sx  and weight loss. And a new  referral to [facility].   Neta Mends. Coralynn Gaona M.D.

## 2023-09-12 ENCOUNTER — Telehealth: Payer: Self-pay

## 2023-09-12 NOTE — Telephone Encounter (Signed)
-----   Message from Benancio Deeds sent at 09/11/2023  4:47 PM EST ----- Jan can you help book this patient in the office with me or APP in the next month or so if any openings? ----- Message ----- From: Madelin Headings, MD Sent: 09/11/2023   1:44 PM EST To: Benancio Deeds, MD  Asking for your team to see her for nausea and weight loss thanks

## 2023-09-12 NOTE — Telephone Encounter (Signed)
Called patient and LM regarding getting her scheduled for an appt. [name] has an opening tomorrow but advised her it will go very quickly. There are currently several appointment in March with APPs at this time. Asked her to call back ASAP to get scheduled

## 2023-09-13 ENCOUNTER — Ambulatory Visit: Payer: 59 | Admitting: Physician Assistant

## 2023-09-13 ENCOUNTER — Telehealth: Payer: Self-pay

## 2023-09-13 ENCOUNTER — Encounter: Payer: Self-pay | Admitting: Physician Assistant

## 2023-09-13 DIAGNOSIS — G2401 Drug induced subacute dyskinesia: Secondary | ICD-10-CM | POA: Diagnosis not present

## 2023-09-13 DIAGNOSIS — F319 Bipolar disorder, unspecified: Secondary | ICD-10-CM

## 2023-09-13 DIAGNOSIS — G4700 Insomnia, unspecified: Secondary | ICD-10-CM

## 2023-09-13 DIAGNOSIS — F411 Generalized anxiety disorder: Secondary | ICD-10-CM

## 2023-09-13 DIAGNOSIS — G2571 Drug induced akathisia: Secondary | ICD-10-CM | POA: Diagnosis not present

## 2023-09-13 MED ORDER — ESCITALOPRAM OXALATE 20 MG PO TABS: 30.0000 mg | ORAL_TABLET | Freq: Every day | ORAL | Status: DC

## 2023-09-13 MED ORDER — MIRTAZAPINE 15 MG PO TABS: 7.5000 mg | ORAL_TABLET | Freq: Every day | ORAL | Status: DC

## 2023-09-13 NOTE — Telephone Encounter (Signed)
Received a call from [facility] from lab reporting the correct microalbumin ratio.   She states there is a problem with microalbumin ratio, wants to make sure pt receive correct ratio on lab result.   She states pt's correct microalbumin ration is 13.   Verify to her that 13 is showing on pt's lab result.   Forwarding to provider for FYI.

## 2023-09-13 NOTE — Telephone Encounter (Signed)
Patient scheduled with Quentin Mulling, PA on 10/21/23 at 8:20 am. Informed patient and she verbalized understanding.

## 2023-09-13 NOTE — Progress Notes (Signed)
[facility]  Patient ID: Lisa Lee,  MRN: [id number]  PCP: Madelin Headings, MD  Date of Evaluation: 09/13/2023 Time spent:30 minutes  Chief Complaint:  Chief Complaint   Anxiety    HISTORY/CURRENT STATUS: For routine med check.    States she has 'a little mania,'  doesn't get tired in the evening. Takes the Xanax at bedtime or wouldn't go to sleep. Goes to bed around 9, wakes up at 5 without alarm. Lies in bed for awhile before she gets up.  Still has leg movements, mostly moving her legs.  Truncal movements are better.  Feels like she wants to crawl out of her skin sometimes. Patient denies increased talkativeness, racing thoughts, impulsivity or risky behaviors, increased spending, increased libido, grandiosity, increased irritability or anger, paranoia, or hallucinations.  Feels down in the dumps b/c of physical problems.  Has a hard time enjoying things.  Work is going well.   No extreme sadness, tearfulness, or feelings of hopelessness. ADLs and personal hygiene are normal.   Denies any changes in concentration, making decisions, or remembering things.  Anxiety is controlled, takes the Xanax rarely during the day, makes her too drowsy.  Denies suicidal or homicidal thoughts.  Review of Systems  Constitutional:  Positive for weight loss.  HENT: Negative.    Eyes: Negative.   Respiratory: Negative.    Cardiovascular: Negative.   Gastrointestinal:        Nausea and unexplained wt loss over the past month. Her PCP took her off several meds to see if that would help. Pt has a GI consult soon.   Skin: Negative.    Individual Medical History/ Review of Systems: Changes? :No    Past medications for mental health diagnoses include: Lexapro, lithium, Xanax, Modafanil, Ingrezza caused depression and sedation, Mirtazapine  Allergies: Penicillins, Ingrezza [valbenazine tosylate], Pravastatin, and Rosuvastatin  Current Medications:  Current Outpatient  Medications:    albuterol (PROAIR HFA) 108 (90 Base) MCG/ACT inhaler, Inhale 2 puffs into the lungs every 6 (six) hours as needed for wheezing., Disp: 1 each, Rfl: 1   ALPRAZolam (XANAX) 0.5 MG tablet, TAKE 1/2 TO 1 TABLET BY MOUTH TWICE DAILY AS NEEDED FOR ANXIETY, Disp: 60 tablet, Rfl: 1   estradiol (ESTRACE) 0.1 MG/GM vaginal cream, Place vaginally., Disp: , Rfl:    ezetimibe (ZETIA) 10 MG tablet, Take 1 tablet (10 mg total) by mouth daily., Disp: 90 tablet, Rfl: 3   levothyroxine (SYNTHROID) 75 MCG tablet, TAKE 1 TABLET BY MOUTH EVERY DAY BEFORE breakfast, Disp: 90 tablet, Rfl: 3   loperamide (IMODIUM A-D) 2 MG tablet, Take 2 mg by mouth as needed., Disp: , Rfl:    Multiple Vitamins-Minerals (MULTIVITAMIN WITH MINERALS) tablet, Take 1 tablet by mouth daily., Disp: , Rfl:    OVER THE COUNTER MEDICATION, Calcium supplement 600mg  with vitamin D 500 units- BID, Disp: , Rfl:    pantoprazole (PROTONIX) 40 MG tablet, Take 1 tablet (40 mg total) by mouth 2 (two) times daily before a meal., Disp: 60 tablet, Rfl: 1   valACYclovir (VALTREX) 500 MG tablet, TAKE 1 TABLET DAILY FOR    SUPPRESSION AND 1 TABLET   TWO TIMES A DAY FOR 3 DAYS FOR OUTBREAK Strength: 500 mg, Disp: 90 tablet, Rfl: 3   alendronate (FOSAMAX) 70 MG tablet, Take 1 tablet (70 mg total) by mouth every 7 (seven) days. Take with a full glass of water on an empty stomach. (Patient not taking: Reported on 09/13/2023), Disp: 12 tablet, Rfl: 3  aspirin EC 81 MG tablet, Take 1 tablet (81 mg total) by mouth daily. (Patient not taking: Reported on 09/13/2023), Disp: , Rfl:    atorvastatin (LIPITOR) 40 MG tablet, Take 1 tablet (40 mg total) by mouth daily. (Patient not taking: Reported on 09/13/2023), Disp: 90 tablet, Rfl: 3   Cholecalciferol (VITAMIN D) 2000 units tablet, Take 2,000 Units by mouth daily. (Patient not taking: Reported on 09/13/2023), Disp: , Rfl:    escitalopram (LEXAPRO) 20 MG tablet, Take 1.5 tablets (30 mg total) by mouth daily.,  Disp: , Rfl:    fenofibrate 160 MG tablet, Take 1 tablet (160 mg total) by mouth at bedtime. (Patient not taking: Reported on 09/13/2023), Disp: 90 tablet, Rfl: 3   mirtazapine (REMERON) 15 MG tablet, Take 0.5 tablets (7.5 mg total) by mouth at bedtime., Disp: , Rfl:   Current Facility-Administered Medications:    0.9 %  sodium chloride infusion, 500 mL, Intravenous, Once, Armbruster, Willaim Rayas, MD Medication Side Effects: none  Family Medical/ Social History: Changes?  none  MENTAL HEALTH EXAM:  Last menstrual period 12/09/2013.There is no height or weight on file to calculate BMI.  General Appearance: Casual and Well Groomed  Eye Contact:  Good  Speech:  Clear and Coherent and Normal Rate  Volume:  Normal  Mood:  Euthymic  Affect:  Congruent  Thought Process:  Goal Directed and Descriptions of Associations: Circumstantial  Orientation:  Full (Time, Place, and Person)  Thought Content: Logical   Suicidal Thoughts:  No  Homicidal Thoughts:  No  Memory:  WNL  Judgement:  Good  Insight:  Good  Psychomotor Activity:   almost constant  moving of legs, crossing her the ankles. Occas truncal motion  Concentration:  Concentration: Good and Attention Span: Good  Recall:  Good  Fund of Knowledge: Good  Language: Good  Assets:  Desire for Improvement Financial Resources/Insurance Housing Transportation Vocational/Educational  ADL's:  Intact  Cognition: WNL  Prognosis:  Good   AIMS    Flowsheet Row Office Visit from 09/13/2023 in [facility] Office Visit from 07/30/2023 in [facility] Office Visit from 06/21/2023 in [facility] Office Visit from 05/21/2023 in [facility]  AIMS Total Score 4 7 18 17       GAD-7    Flowsheet Row Office Visit from 07/09/2023 in [facility] [place]  Total GAD-7 Score 4      PHQ2-9    Flowsheet Row Office  Visit from 07/09/2023 in [facility] [place] Office Visit from 04/23/2022 in [facility] [place]  PHQ-2 Total Score 0 0  PHQ-9 Total Score 3 2      Flowsheet Row Admission (Discharged) from 03/16/2022 in [facility]  C-SSRS RISK CATEGORY No Risk      DIAGNOSES:    ICD-10-CM   1. Bipolar I disorder (HCC)  F31.9     2. Akathisia  G25.71     3. Tardive dyskinesia  G24.01     4. Generalized anxiety disorder  F41.1     5. Insomnia, unspecified type  G47.00        Receiving Psychotherapy: No   RECOMMENDATIONS:  PDMP was reviewed.  Last Xanax filled to 10/17/2023.   I provided 30  minutes of face to face time during this encounter, including time spent before and after the visit in records review, medical decision making, counseling pertinent to today's visit, and charting.  She never took the Mirtazapine given for akathisia. I highly recommend she start it. It's not for sleep so much right now as for akathisia, but of course it will help her sleep and hopefully increase her appetite so she'll feel more like eating.   Also discussed her mood. Her sx seem circumstantial, but b/c of the Bipolar d/o I strongly recommend she get back on a mood stabilizer or AAP to prevent/tx it. Since she's having the nausea and wt loss, I don't want to make too many changes at once, but will monitor her closely and she knows to call immediately if manic or severe depressive sx occur.   Continue Xanax 0.5 mg, 1/2-1 p.o. twice daily as needed. increase Lexapro 20 mg, 1.5 pills p.o. every morning. Start Mirtzapine 15 mg, 1/2 at bedtime.  Return in 4 weeks.  Melony Overly, PA-C

## 2023-09-13 NOTE — Telephone Encounter (Signed)
Inbound call from patient returning phone call. Requesting a call back. Please advise, thank you.

## 2023-09-15 ENCOUNTER — Encounter: Payer: Self-pay | Admitting: Physician Assistant

## 2023-09-19 ENCOUNTER — Encounter: Payer: Self-pay | Admitting: Internal Medicine

## 2023-09-20 ENCOUNTER — Other Ambulatory Visit: Payer: 59

## 2023-09-20 NOTE — Telephone Encounter (Signed)
Glad feeling better . Agree with your plan   Would add one med back at a time   and go slow   Suspect drug interactions?  But if doesn't recurr will never be confirmed

## 2023-09-23 ENCOUNTER — Telehealth: Payer: Self-pay | Admitting: Physician Assistant

## 2023-09-23 NOTE — Telephone Encounter (Signed)
 Pt called to make follow up appt at 1:41p.  She said that she wanted Rosey Bath to know that the Mirtazapine was not helping her sleep.  So a week ago she started about a week ago taking the Mirttzapine and half a Xanax.  She wants to know if that's ok.  She also stated since she started taking the Mirtazapine and lexapro, the nausea went away.  Next appt 4/14 and added to the wait list

## 2023-09-24 ENCOUNTER — Other Ambulatory Visit: Payer: 59

## 2023-09-24 NOTE — Telephone Encounter (Signed)
 Lisa Lee says that the Mirtazapine 15 mg did not make her sleepy. The second night she took half Mirtazapine and a half Xanax 0.5mg , she said that made a world of difference with her sleep. She wants to know if that's safe to do.   She no longer has nausea.   Please advise.

## 2023-09-24 NOTE — Telephone Encounter (Signed)
 Yes that's safe. I'm glad to hear the nausea is better!

## 2023-09-24 NOTE — Telephone Encounter (Signed)
 Lvm notifying pt that her regimen is safe.

## 2023-09-26 ENCOUNTER — Other Ambulatory Visit: Payer: 59

## 2023-09-30 ENCOUNTER — Ambulatory Visit: Payer: BC Managed Care – PPO | Admitting: Internal Medicine

## 2023-10-03 ENCOUNTER — Other Ambulatory Visit: Payer: 59

## 2023-10-04 LAB — MICROALBUMIN / CREATININE URINE RATIO
Creatinine,U: 54.1 mg/dL
Microalb Creat Ratio: 13 mg/g (ref 0.0–30.0)
Microalb, Ur: <0.7 mg/dL (ref 0.0–1.9)

## 2023-10-08 ENCOUNTER — Encounter: Payer: Self-pay | Admitting: Internal Medicine

## 2023-10-08 DIAGNOSIS — E785 Hyperlipidemia, unspecified: Secondary | ICD-10-CM

## 2023-10-08 NOTE — Telephone Encounter (Signed)
 Contacted the patient and advised referral has been placed for [facility] for lipid management. Patient agreed with referral.

## 2023-10-10 ENCOUNTER — Ambulatory Visit
Admission: RE | Admit: 2023-10-10 | Discharge: 2023-10-10 | Disposition: A | Discharge status: Home / Self Care | Source: Ambulatory Visit | Attending: Internal Medicine | Admitting: Internal Medicine

## 2023-10-10 DIAGNOSIS — R944 Abnormal results of kidney function studies: Secondary | ICD-10-CM

## 2023-10-10 MED ORDER — PANTOPRAZOLE SODIUM 40 MG PO TBEC
40.0000 mg | DELAYED_RELEASE_TABLET | Freq: Two times a day (BID) | ORAL | 0 refills | Status: DC
Start: 2023-10-10 — End: 2024-01-20

## 2023-10-13 ENCOUNTER — Encounter: Payer: Self-pay | Admitting: Internal Medicine

## 2023-10-13 NOTE — Progress Notes (Signed)
 So reassuring renal US is normal  . So... there are meds that are used  if  diabetes or protein in urine to prevent further progression ( farxiga andjardiance type meds)  Last protein was normal but since  there was  software problem in past with lab  Please order a  repeat uACR  when convenient  Dx ckd

## 2023-10-14 ENCOUNTER — Other Ambulatory Visit: Payer: Self-pay | Admitting: Internal Medicine

## 2023-10-16 ENCOUNTER — Other Ambulatory Visit: Payer: Self-pay

## 2023-10-16 DIAGNOSIS — N189 Chronic kidney disease, unspecified: Secondary | ICD-10-CM

## 2023-10-16 NOTE — Telephone Encounter (Signed)
 Patient is returning a call she would like a call back

## 2023-10-17 ENCOUNTER — Telehealth: Payer: Self-pay | Admitting: Physician Assistant

## 2023-10-17 MED ORDER — ESCITALOPRAM OXALATE 20 MG PO TABS: 30.0000 mg | ORAL_TABLET | Freq: Every day | ORAL | 0 refills | Status: DC

## 2023-10-17 NOTE — Telephone Encounter (Signed)
 I called pt to r/s appt due to Rosey Bath being out sick..  She asked for refill of Lexapro 30mg  to  [facility].  Next appt 4/14

## 2023-10-17 NOTE — Telephone Encounter (Signed)
 Lexapro sent to [facility]

## 2023-10-18 ENCOUNTER — Telehealth: Admitting: Physician Assistant

## 2023-10-21 ENCOUNTER — Other Ambulatory Visit

## 2023-10-21 ENCOUNTER — Ambulatory Visit: Payer: 59 | Admitting: Physician Assistant

## 2023-10-21 DIAGNOSIS — N189 Chronic kidney disease, unspecified: Secondary | ICD-10-CM

## 2023-10-22 ENCOUNTER — Encounter: Payer: Self-pay | Admitting: Internal Medicine

## 2023-10-22 DIAGNOSIS — R944 Abnormal results of kidney function studies: Secondary | ICD-10-CM

## 2023-10-22 DIAGNOSIS — Z79899 Other long term (current) drug therapy: Secondary | ICD-10-CM

## 2023-10-22 LAB — MICROALBUMIN / CREATININE URINE RATIO
Creatinine, Urine: 122 mg/dL (ref 20–275)
Microalb Creat Ratio: 3 mg/g{creat} (ref <30)
Microalb, Ur: 0.4 mg/dL

## 2023-10-22 NOTE — Progress Notes (Signed)
 Good news  NO elevation of protein elevation  confrimed

## 2023-10-31 ENCOUNTER — Telehealth: Admitting: Physician Assistant

## 2023-10-31 ENCOUNTER — Encounter: Payer: Self-pay | Admitting: Physician Assistant

## 2023-10-31 DIAGNOSIS — G2401 Drug induced subacute dyskinesia: Secondary | ICD-10-CM | POA: Diagnosis not present

## 2023-10-31 DIAGNOSIS — G2571 Drug induced akathisia: Secondary | ICD-10-CM | POA: Diagnosis not present

## 2023-10-31 DIAGNOSIS — F411 Generalized anxiety disorder: Secondary | ICD-10-CM | POA: Diagnosis not present

## 2023-10-31 DIAGNOSIS — F319 Bipolar disorder, unspecified: Secondary | ICD-10-CM

## 2023-10-31 DIAGNOSIS — G4700 Insomnia, unspecified: Secondary | ICD-10-CM

## 2023-10-31 MED ORDER — ESZOPICLONE 2 MG PO TABS
2.0000 mg | ORAL_TABLET | Freq: Every evening | ORAL | 0 refills | Status: DC | PRN
Start: 2023-10-31 — End: 2023-12-03

## 2023-10-31 MED ORDER — ESCITALOPRAM OXALATE 20 MG PO TABS: 40.0000 mg | ORAL_TABLET | Freq: Every day | ORAL | 1 refills | Status: DC

## 2023-10-31 NOTE — Progress Notes (Signed)
 [facility]  Patient ID: CAMAY PEDIGO,  MRN: [id number]  PCP: Madelin Headings, MD  Date of Evaluation: 10/31/2023 Time spent:30 minutes  Chief Complaint:  Chief Complaint   Insomnia; Follow-up   Virtual Visit via Telehealth  I connected with patient by a video enabled telemedicine application with their informed consent, and verified patient privacy and that I am speaking with the correct person using two identifiers.  I am private, in my office and the patient is at work.  I discussed the limitations, risks, security and privacy concerns of performing an evaluation and management service by video and the availability of in person appointments. I also discussed with the patient that there may be a patient responsible charge related to this service. The patient expressed understanding and agreed to proceed.   I discussed the assessment and treatment plan with the patient. The patient was provided an opportunity to ask questions and all were answered. The patient agreed with the plan and demonstrated an understanding of the instructions.   The patient was advised to call back or seek an in-person evaluation if the symptoms worsen or if the condition fails to improve as anticipated.  I provided 30  minutes of non-face-to-face time during this encounter.  HISTORY/CURRENT STATUS: For routine med check.    Since adding Mirtazapine, the akathisia is 'a thousand times better.' But she still has abnl facial movements. Hx reviewed that Allena Earing was given last fall, after that time, movements in her trunk and legs began. The mouth movements didn't improve, but she wasn't on the Ingrezza for long.Reported that the abnormal facial and truncal movements almost completely resolved, after going off Ingrezza, and the leg movements were improving.  I discussed her case with Dr. Jennelle Human per phone note 08/08/2023, he recommended Austedo b/c more doses are available. She refused Austedo.    "I miss my Latuda. It really helped my mood. I feel down a lot." Not sure if it's b/c Bip D/O or the effects of TD and she's not getting better. The Ingrezza made her movements worse and that's discouraging. She is working, is a Engineer, civil (consulting) and teaches high school. Very tired, not sleeping well. Low energy and motivation, no feelings of hopelessness, ADLs and personal hygiene are nl. Appetite is nl and weight is stable. Anxiety is still an issue, the xanax is helpful but if takes during the work day, she gets drowsy. No SI/HI.  Patient denies increased energy with decreased need for sleep, increased talkativeness, racing thoughts, impulsivity or risky behaviors, increased spending, increased libido, grandiosity, increased irritability or anger, paranoia, or hallucinations.  Review of Systems  Constitutional:  Positive for malaise/fatigue.  HENT: Negative.    Eyes: Negative.   Respiratory: Negative.    Cardiovascular: Negative.   Gastrointestinal: Negative.   Genitourinary: Negative.   Musculoskeletal: Negative.   Skin: Negative.   Neurological:        See HPI  Endo/Heme/Allergies: Negative.   Psychiatric/Behavioral:         See HPI   Individual Medical History/ Review of Systems: Changes? :No    Past medications for mental health diagnoses include: Lexapro, lithium, Xanax, Modafanil, Ingrezza caused depression and sedation (pt feels like it made movements worse,) Mirtazapine, Latuda helped  Allergies: Penicillins, Ingrezza [valbenazine tosylate], Pravastatin, and Rosuvastatin  Current Medications:  Current Outpatient Medications:    albuterol (PROAIR HFA) 108 (90 Base) MCG/ACT inhaler, Inhale 2 puffs into the lungs every 6 (six) hours as needed for wheezing., Disp: 1 each,  Rfl: 1   ALPRAZolam (XANAX) 0.5 MG tablet, TAKE 1/2 TO 1 TABLET BY MOUTH TWICE DAILY AS NEEDED FOR ANXIETY, Disp: 60 tablet, Rfl: 1   estradiol (ESTRACE) 0.1 MG/GM vaginal cream, Place vaginally., Disp: , Rfl:     eszopiclone (LUNESTA) 2 MG TABS tablet, Take 1 tablet (2 mg total) by mouth at bedtime as needed for sleep. Take immediately before bedtime, Disp: 30 tablet, Rfl: 0   ezetimibe (ZETIA) 10 MG tablet, Take 1 tablet (10 mg total) by mouth daily., Disp: 90 tablet, Rfl: 3   levothyroxine (SYNTHROID) 75 MCG tablet, TAKE 1 TABLET BY MOUTH EVERY DAY BEFORE BREAKFAST, Disp: 90 tablet, Rfl: 2   loperamide (IMODIUM A-D) 2 MG tablet, Take 2 mg by mouth as needed., Disp: , Rfl:    mirtazapine (REMERON) 15 MG tablet, Take 0.5 tablets (7.5 mg total) by mouth at bedtime., Disp: , Rfl:    Multiple Vitamins-Minerals (MULTIVITAMIN WITH MINERALS) tablet, Take 1 tablet by mouth daily., Disp: , Rfl:    OVER THE COUNTER MEDICATION, Calcium supplement 600mg  with vitamin D 500 units- BID, Disp: , Rfl:    pantoprazole (PROTONIX) 40 MG tablet, Take 1 tablet (40 mg total) by mouth 2 (two) times daily before a meal., Disp: 60 tablet, Rfl: 0   valACYclovir (VALTREX) 500 MG tablet, TAKE 1 TABLET DAILY FOR    SUPPRESSION AND 1 TABLET   TWO TIMES A DAY FOR 3 DAYS FOR OUTBREAK Strength: 500 mg, Disp: 90 tablet, Rfl: 3   alendronate (FOSAMAX) 70 MG tablet, Take 1 tablet (70 mg total) by mouth every 7 (seven) days. Take with a full glass of water on an empty stomach. (Patient not taking: Reported on 10/31/2023), Disp: 12 tablet, Rfl: 3   aspirin EC 81 MG tablet, Take 1 tablet (81 mg total) by mouth daily. (Patient not taking: Reported on 09/13/2023), Disp: , Rfl:    atorvastatin (LIPITOR) 40 MG tablet, Take 1 tablet (40 mg total) by mouth daily. (Patient not taking: Reported on 09/13/2023), Disp: 90 tablet, Rfl: 3   Cholecalciferol (VITAMIN D) 2000 units tablet, Take 2,000 Units by mouth daily. (Patient not taking: Reported on 09/13/2023), Disp: , Rfl:    escitalopram (LEXAPRO) 20 MG tablet, Take 2 tablets (40 mg total) by mouth daily., Disp: 60 tablet, Rfl: 1   fenofibrate 160 MG tablet, Take 1 tablet (160 mg total) by mouth at bedtime.  (Patient not taking: Reported on 10/31/2023), Disp: 90 tablet, Rfl: 3  Current Facility-Administered Medications:    0.9 %  sodium chloride infusion, 500 mL, Intravenous, Once, Armbruster, Willaim Rayas, MD Medication Side Effects: none  Family Medical/ Social History: Changes?  none  MENTAL HEALTH EXAM:  Last menstrual period 12/09/2013.There is no height or weight on file to calculate BMI.  General Appearance: Casual and Well Groomed  Eye Contact:  Good  Speech:  Clear and Coherent and Normal Rate  Volume:  Normal  Mood:  Euthymic  Affect:  Congruent  Thought Process:  Goal Directed and Descriptions of Associations: Circumstantial  Orientation:  Full (Time, Place, and Person)  Thought Content: Logical   Suicidal Thoughts:  No  Homicidal Thoughts:  No  Memory:  WNL  Judgement:  Good  Insight:  Good  Psychomotor Activity:  Restlessness and TD  Concentration:  Concentration: Good and Attention Span: Good  Recall:  Good  Fund of Knowledge: Good  Language: Good  Assets:  Desire for Improvement Financial Resources/Insurance Housing Transportation Vocational/Educational  ADL's:  Intact  Cognition:  WNL  Prognosis:  Good   AIMS    Flowsheet Row Video Visit from 10/31/2023 in [facility] Office Visit from 09/13/2023 in [facility] Office Visit from 07/30/2023 in [facility] Office Visit from 06/21/2023 in [facility] Office Visit from 05/21/2023 in [facility]  AIMS Total Score 7 4 7 18 17       GAD-7    Flowsheet Row Office Visit from 07/09/2023 in [facility] [place]  Total GAD-7 Score 4      PHQ2-9    Flowsheet Row Office Visit from 07/09/2023 in [facility] [place] Office Visit from 04/23/2022 in [facility] [place]  PHQ-2 Total Score 0 0  PHQ-9 Total Score 3  2      Flowsheet Row Admission (Discharged) from 03/16/2022 in [facility]  C-SSRS RISK CATEGORY No Risk      DIAGNOSES:    ICD-10-CM   1. Bipolar I disorder (HCC)  F31.9     2. Tardive dyskinesia  G24.01     3. Generalized anxiety disorder  F41.1     4. Akathisia  G25.71     5. Insomnia, unspecified type  G47.00       Receiving Psychotherapy: No   RECOMMENDATIONS:  PDMP was reviewed.  Last Xanax filled to 09/02/2023.   I provided 30 minutes of non-face-to-face time during this encounter, including time spent before and after the visit in records review, medical decision making, counseling pertinent to today's visit, and charting.   She needs to be on an antipsychotic or mood stabilizer d/t Bip d/o. She isn't willing to risk movements worsening. And she's not willing to chance taking the Austedo either. She asks about getting another opinion, which I feel is a good idea. I recommend Dr. Evelene Croon, Tamela Oddi, [facility], [facility], or [facility].   In the meantime, I think it necessary to address the depression. I recommend increasing the Lexapro. She agrees.   Sleep hygiene discussed. Recommend trying Lunesta. Pros and cons given and she accepts.   Continue Xanax 0.5 mg, 1/2-1 p.o. twice daily as needed. increase Lexapro 20 mg, 2 pills p.o. every morning. Start Lunesta 2 mg, 1 at bedtime prn.  Continue Mirtzapine 15 mg, 1/2 at bedtime.  Return in 6 weeks.  Melony Overly, PA-C

## 2023-11-02 ENCOUNTER — Encounter: Payer: Self-pay | Admitting: Internal Medicine

## 2023-11-06 NOTE — Telephone Encounter (Signed)
 I see  the  mouth movements   This is most likely from a medication  side effect .   what does your psychiatry team say about resolution or other treatment?  Marland Kitchen  And would hope improve with time  but this is a area out of my area of expertise.  YOU can  ask f your psych team  other  options and prognosis  for these movements .  Wish I could help  more.

## 2023-11-06 NOTE — Telephone Encounter (Signed)
 So making sure bp is in an optimum range most of the time   below 130/80 Avoiding   or limiting  nsaids   And follow  over time   This test was done in March  I advise we repeat bmp with  and urine midcroalbumin  ratio  in may June and then fu appt  ( can be  virtual if y ou wish)  ( K please help arrange this lab and fu  )  I know  y ou are dealing with the movement issue from medications  . Reassuring that your  kidney ultrasound is normal

## 2023-11-08 ENCOUNTER — Emergency Department (HOSPITAL_COMMUNITY)
Admission: EM | Admit: 2023-11-08 | Discharge: 2023-11-08 | Disposition: A | Discharge status: Home / Self Care | Attending: Emergency Medicine | Admitting: Emergency Medicine

## 2023-11-08 ENCOUNTER — Telehealth: Payer: Self-pay | Admitting: Internal Medicine

## 2023-11-08 ENCOUNTER — Other Ambulatory Visit: Payer: Self-pay

## 2023-11-08 ENCOUNTER — Encounter (HOSPITAL_COMMUNITY): Payer: Self-pay

## 2023-11-08 DIAGNOSIS — Z0130 Encounter for examination of blood pressure without abnormal findings: Secondary | ICD-10-CM

## 2023-11-08 DIAGNOSIS — I2510 Atherosclerotic heart disease of native coronary artery without angina pectoris: Secondary | ICD-10-CM | POA: Insufficient documentation

## 2023-11-08 DIAGNOSIS — Z7982 Long term (current) use of aspirin: Secondary | ICD-10-CM | POA: Diagnosis not present

## 2023-11-08 DIAGNOSIS — I10 Essential (primary) hypertension: Secondary | ICD-10-CM | POA: Diagnosis present

## 2023-11-08 DIAGNOSIS — Z8679 Personal history of other diseases of the circulatory system: Secondary | ICD-10-CM

## 2023-11-08 DIAGNOSIS — E039 Hypothyroidism, unspecified: Secondary | ICD-10-CM | POA: Diagnosis not present

## 2023-11-08 DIAGNOSIS — J45909 Unspecified asthma, uncomplicated: Secondary | ICD-10-CM | POA: Diagnosis not present

## 2023-11-08 LAB — CBC
HCT: 38.9 % (ref 36.0–46.0)
Hemoglobin: 13.4 g/dL (ref 12.0–15.0)
MCH: 31.5 pg (ref 26.0–34.0)
MCHC: 34.4 g/dL (ref 30.0–36.0)
MCV: 91.5 fL (ref 80.0–100.0)
Platelets: 330 10*3/uL (ref 150–400)
RBC: 4.25 MIL/uL (ref 3.87–5.11)
RDW: 11.8 % (ref 11.5–15.5)
WBC: 10.5 10*3/uL (ref 4.0–10.5)
nRBC: 0 % (ref 0.0–0.2)

## 2023-11-08 LAB — URINALYSIS, ROUTINE W REFLEX MICROSCOPIC
Bilirubin Urine: NEGATIVE
Glucose, UA: NEGATIVE mg/dL
Hgb urine dipstick: NEGATIVE
Ketones, ur: NEGATIVE mg/dL
Leukocytes,Ua: NEGATIVE
Nitrite: NEGATIVE
Protein, ur: NEGATIVE mg/dL
Specific Gravity, Urine: 1.013 (ref 1.005–1.030)
pH: 5 (ref 5.0–8.0)

## 2023-11-08 LAB — BASIC METABOLIC PANEL WITH GFR
Anion gap: 10 (ref 5–15)
BUN: 25 mg/dL — ABNORMAL HIGH (ref 8–23)
CO2: 22 mmol/L (ref 22–32)
Calcium: 9.9 mg/dL (ref 8.9–10.3)
Chloride: 107 mmol/L (ref 98–111)
Creatinine, Ser: 0.81 mg/dL (ref 0.44–1.00)
GFR, Estimated: >60 mL/min (ref >60)
Glucose, Bld: 157 mg/dL — ABNORMAL HIGH (ref 70–99)
Potassium: 3.8 mmol/L (ref 3.5–5.1)
Sodium: 139 mmol/L (ref 135–145)

## 2023-11-08 NOTE — Telephone Encounter (Signed)
 Patient is currently in ED for high BP. She is waiting in triage at time of call.  Patient denies any dizziness, headache, blurred vision, ringing in ears or flushing. Denies CP.  Earlier this week she reports BP 140/90. 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 She had BP rechecked in nurse's office around 1:30 PM with reading of 178/110.  Patient not currently taking any BP medications. BP is usually 110-120 systolic and 60-80 diastolic.  Patient states she is afraid of having stroke with elevated BPs.   Encouraged patient to stay in ED triage for evaluation. Will forward to Dr. Lynnette Caffey and his nurse to review and follow-up with patient.

## 2023-11-08 NOTE — ED Provider Notes (Signed)
 [facility] Provider Note   CSN: [id number] Arrival date & time: 11/08/23  1426     History  Chief Complaint  Patient presents with   Hypertension    Magaret D Conchar-Mabe is a 63 y.o. female with past medical history of bipolar I, HLD, hypothyroidism, asthma, GERD, anxiety, nonobstructive CAD presents to emergency department for evaluation of high blood pressure.  She reports that she took her blood pressure this morning with her home automated BP cuff and it was reading in the 180s/100s. She then rechecked it with a wrist cuff at work which had a similar reading. Her PCP recently increased her lexapro last week but she does not feel that it has helped yet. She has never had any complaints of chest pain, headache, visual disturbances at all today.  She initially consulted her cardiologist but husband encouraged ED evaluation as they were concerned that she would have a stroke.   Hypertension Pertinent negatives include no chest pain, no abdominal pain, no headaches and no shortness of breath.      Home Medications Prior to Admission medications   Medication Sig Start Date End Date Taking? Authorizing Provider  albuterol (PROAIR HFA) 108 (90 Base) MCG/ACT inhaler Inhale 2 puffs into the lungs every 6 (six) hours as needed for wheezing. 07/09/23   Panosh, Wanda K, MD  alendronate (FOSAMAX) 70 MG tablet Take 1 tablet (70 mg total) by mouth every 7 (seven) days. Take with a full glass of water on an empty stomach. Patient not taking: Reported on 10/31/2023 07/09/23   Panosh, Wanda K, MD  ALPRAZolam (XANAX) 0.5 MG tablet TAKE 1/2 TO 1 TABLET BY MOUTH TWICE DAILY AS NEEDED FOR ANXIETY 09/02/23   Verneda Golder, PA-C  aspirin EC 81 MG tablet Take 1 tablet (81 mg total) by mouth daily. Patient not taking: Reported on 09/13/2023 08/28/11   Darlis Eisenmenger, MD  atorvastatin (LIPITOR) 40 MG tablet Take 1 tablet (40 mg total) by mouth daily. Patient  not taking: Reported on 09/13/2023 08/07/23   Thukkani, Arun K, MD  Cholecalciferol (VITAMIN D) 2000 units tablet Take 2,000 Units by mouth daily. Patient not taking: Reported on 09/13/2023    [provider]  escitalopram (LEXAPRO) 20 MG tablet Take 2 tablets (40 mg total) by mouth daily. 10/31/23   Marvia Slocumb T, PA-C  estradiol (ESTRACE) 0.1 MG/GM vaginal cream Place vaginally.    [provider]  eszopiclone (LUNESTA) 2 MG TABS tablet Take 1 tablet (2 mg total) by mouth at bedtime as needed for sleep. Take immediately before bedtime 10/31/23   Marvia Slocumb T, PA-C  ezetimibe (ZETIA) 10 MG tablet Take 1 tablet (10 mg total) by mouth daily. 08/21/23   Thukkani, Arun K, MD  fenofibrate 160 MG tablet Take 1 tablet (160 mg total) by mouth at bedtime. Patient not taking: Reported on 10/31/2023 08/21/23   Thukkani, Arun K, MD  levothyroxine (SYNTHROID) 75 MCG tablet TAKE 1 TABLET BY MOUTH EVERY DAY BEFORE BREAKFAST 10/14/23   Webb, Padonda B, FNP  loperamide (IMODIUM A-D) 2 MG tablet Take 2 mg by mouth as needed.    [provider]  mirtazapine (REMERON) 15 MG tablet Take 0.5 tablets (7.5 mg total) by mouth at bedtime. 09/13/23   Marvia Slocumb T, PA-C  Multiple Vitamins-Minerals (MULTIVITAMIN WITH MINERALS) tablet Take 1 tablet by mouth daily.    [provider]  OVER THE COUNTER MEDICATION Calcium supplement 600mg  with vitamin D 500  units- BID    [provider]  pantoprazole (PROTONIX) 40 MG tablet Take 1 tablet (40 mg total) by mouth 2 (two) times daily before a meal. 10/10/23   Armbruster, Lendon Queen, MD  valACYclovir (VALTREX) 500 MG tablet TAKE 1 TABLET DAILY FOR    SUPPRESSION AND 1 TABLET   TWO TIMES A DAY FOR 3 DAYS FOR OUTBREAK Strength: 500 mg 03/12/23   Panosh, Wanda K, MD      Allergies    Penicillins, Ingrezza [valbenazine tosylate], Pravastatin, and Rosuvastatin    Review of Systems   Review of Systems  Constitutional:  Negative for chills, fatigue and  fever.  Respiratory:  Negative for cough, chest tightness, shortness of breath and wheezing.   Cardiovascular:  Negative for chest pain and palpitations.  Gastrointestinal:  Negative for abdominal pain, constipation, diarrhea, nausea and vomiting.  Neurological:  Negative for dizziness, seizures, weakness, light-headedness, numbness and headaches.    Physical Exam Updated Vital Signs BP (!) 112/55 (BP Location: Right Arm)   Pulse 66   Temp 98.5 F (36.9 C) (Oral)   Resp 17   Ht 5\' 1"  (1.549 m)   Wt 57.2 kg   LMP 12/09/2013   SpO2 97%   BMI 23.81 kg/m  Physical Exam Vitals and nursing note reviewed.  Constitutional:      General: She is not in acute distress.    Appearance: Normal appearance. She is not ill-appearing.  HENT:     Head: Normocephalic and atraumatic.     Right Ear: Tympanic membrane, ear canal and external ear normal.     Left Ear: Tympanic membrane, ear canal and external ear normal.     Nose: Nose normal.     Mouth/Throat:     Mouth: Mucous membranes are moist.  Eyes:     General: Lids are normal. Vision grossly intact. No scleral icterus.       Right eye: No discharge.        Left eye: No discharge.     Extraocular Movements:     Right eye: Normal extraocular motion and no nystagmus.     Left eye: Normal extraocular motion and no nystagmus.     Conjunctiva/sclera: Conjunctivae normal.  Cardiovascular:     Rate and Rhythm: Normal rate.     Pulses: Normal pulses.  Pulmonary:     Effort: Pulmonary effort is normal. No respiratory distress.     Breath sounds: Normal breath sounds. No stridor. No wheezing or rhonchi.  Chest:     Chest wall: No tenderness.  Abdominal:     General: There is no distension.     Palpations: Abdomen is soft. There is no mass.     Tenderness: There is no abdominal tenderness. There is no guarding.  Musculoskeletal:     Cervical back: Normal range of motion and neck supple. No rigidity or tenderness.     Right lower leg: No  edema.     Left lower leg: No edema.  Lymphadenopathy:     Cervical: No cervical adenopathy.  Skin:    General: Skin is warm.     Capillary Refill: Capillary refill takes less than 2 seconds.     Coloration: Skin is not jaundiced or pale.  Neurological:     General: No focal deficit present.     Mental Status: She is alert and oriented to person, place, and time. Mental status is at baseline.     Cranial Nerves: No cranial nerve deficit.  Sensory: No sensory deficit.     Motor: No weakness.     Coordination: Coordination normal.     Gait: Gait normal.     Deep Tendon Reflexes: Reflexes normal.     ED Results / Procedures / Treatments   Labs (all labs ordered are listed, but only abnormal results are displayed) Labs Reviewed  BASIC METABOLIC PANEL WITH GFR - Abnormal; Notable for the following components:      Result Value   Glucose, Bld 157 (*)    BUN 25 (*)    All other components within normal limits  CBC  URINALYSIS, ROUTINE W REFLEX MICROSCOPIC  CBG MONITORING, ED    EKG None  Radiology No results found.  Procedures Procedures    Medications Ordered in ED Medications - No data to display  ED Course/ Medical Decision Making/ A&P                                 Medical Decision Making Amount and/or Complexity of Data Reviewed Labs: ordered.   Patient presents to the ED for concern of hypertension, this involves an extensive number of treatment options, and is a complaint that carries with it a high risk of complications and morbidity.  The differential diagnosis includes hypertensive urgency, emergency, ACS, AKI   Co morbidities that complicate the patient evaluation  See HPI   Additional history obtained:  Additional history obtained from [facility], Nursing, and Outside Medical Records   External records from outside source obtained and reviewed including triage RN note, most recent cardiology note from 08/21/2023, and MyChart messaging between  patient and provider from cardiology today   Lab Tests:  I Ordered, and personally interpreted labs.  The pertinent results include:   Creatinine 0.81 CBG 157 BUN 25 (baseline 19-37 over past year) UA normal     Cardiac Monitoring:  The patient was maintained on a cardiac monitor.  I personally viewed and interpreted the cardiac monitored which showed an underlying rhythm of: NSR with no ST nor ischemia.  Similar to previous EKG     Problem List / ED Course:  Asymptomatic HTN Patient reports that blood pressures were in the 180s SBP.  She is never had any complaints of chest pain, visual disturbances, headache.  Here in emergency department, highest blood pressure recorded was 140/79.  Blood pressure readings could have been elevated this morning secondary to elevated cortisol levels, stress.  However, has remained asymptomatic Workup here is notable for creatinine WNL.  EKG WNL with no change from previous.  No ST nor ischemia noted She is neurologically intact on exam -low suspicion for CVA/TIA Never had complaints of chest pain, shortness of breath so have low suspicion for ACS, acute cardiopulmonary pathology Does not appear to be having a hypertensive urgency or emergency with reassuring blood work, and physical exam Creatinine WNL/ no AKI Will have patient follow-up with cardiology who appears that they were planning on recommending an ARB per MyChart messaging  Reevaluation:  After the interventions noted above, I reevaluated the patient and found that they have :improved   Social Determinants of Health:  Has PCP and cardiology follow-up   Dispostion:  After consideration of the diagnostic results and the patients response to treatment, I feel that the patent would benefit from outpatient management cardiology follow-up.   Discussed ED workup, disposition, return to ED precautions with patient who expresses understanding agrees with plan.  All questions  answered  to their satisfaction.  They are agreeable to plan.  Discharge instructions provided on paperwork  Final Clinical Impression(s) / ED Diagnoses Final diagnoses:  Examination of blood pressure  History of high blood pressure  Asymptomatic hypertension    Rx / DC Orders ED Discharge Orders     None         Royann Cords, PA 11/08/23 1944    Scarlette Currier, MD 11/09/23 1115

## 2023-11-08 NOTE — Telephone Encounter (Signed)
 Pt c/o BP issue: STAT if pt c/o blurred vision, one-sided weakness or slurred speech.  STAT if BP is GREATER than 180/120 TODAY.  STAT if BP is LESS than 90/60 and SYMPTOMATIC TODAY  1. What is your BP concern? Hypertension   2. Have you taken any BP medication today? No, not on a BP medication  3. What are your last 5 BP readings? 7:30 am 180/100 1:30 pm 178/110  4. Are you having any other symptoms (ex. Dizziness, headache, blurred vision, passed out)? 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 Denies any symptoms, but is scared of having a stroke due to her BP normally ranging 120/70

## 2023-11-08 NOTE — Discharge Instructions (Addendum)
 Thank you for letting us evaluate you today.  Your initial blood pressure here in the emergency department was 140/79 and your second blood pressure was 112/55.  You have remained hemodynamically stable while here with no tachycardia.  Your kidney function AKA your creatinine is WNL.  I do not believe that you are having a hypertensive crisis or emergency that is requiring emergent blood pressure regulation.   I recommend you take your BP once a day at the same time each week. Preferably not in morning as it is highest then. I think should follow-up with your cardiologist regarding further blood pressure management  Return to emergency department if you experience visual disturbances such as blurred vision, loss of vision, severe headache, chest pain, numbness or weakness in one-sided your body

## 2023-11-08 NOTE — ED Triage Notes (Addendum)
 Pt states she has taken her blood pressure a few times today and it has been reading 170-180/100s. Pt denies any symptoms.  Pt states she has not felt like herself for past few months and has recently been diagnosed with kidney disease.

## 2023-11-11 ENCOUNTER — Telehealth: Payer: 59 | Admitting: Physician Assistant

## 2023-11-11 MED ORDER — TELMISARTAN 40 MG PO TABS: 40.0000 mg | ORAL_TABLET | Freq: Every day | ORAL | 3 refills | Status: DC

## 2023-11-11 NOTE — Telephone Encounter (Signed)
 Thukkani, Arun K, MD    AT I would recommend that she start Micardis 40 mg, get a BMP in a week, and follow-up with one of the APP's in 2 weeks.  _____________________________________________________   Liana Reding and spoke w patient.   She said she is getting a new BP cuff this week, just so that she knows it is accurate.  She will start Micardis 40 mg and monitor BP at home.  She will check daily around 3-4 hrs after dose.  Will go to [facility] in 7-10 days after starting med.   Will add note to PharmD appointment on 12/03/22 - they are seeing pt for lipids, will add on for blood pressure as well.

## 2023-11-12 ENCOUNTER — Encounter: Payer: Self-pay | Admitting: Internal Medicine

## 2023-11-12 DIAGNOSIS — F319 Bipolar disorder, unspecified: Secondary | ICD-10-CM

## 2023-11-12 DIAGNOSIS — Z79899 Other long term (current) drug therapy: Secondary | ICD-10-CM

## 2023-11-12 DIAGNOSIS — T887XXA Unspecified adverse effect of drug or medicament, initial encounter: Secondary | ICD-10-CM

## 2023-11-21 ENCOUNTER — Encounter: Payer: Self-pay | Admitting: Internal Medicine

## 2023-11-21 LAB — BASIC METABOLIC PANEL WITH GFR
BUN/Creatinine Ratio: 25 (ref 12–28)
BUN: 25 mg/dL (ref 8–27)
CO2: 21 mmol/L (ref 20–29)
Calcium: 11.1 mg/dL — ABNORMAL HIGH (ref 8.7–10.3)
Chloride: 103 mmol/L (ref 96–106)
Creatinine, Ser: 1.02 mg/dL — ABNORMAL HIGH (ref 0.57–1.00)
Glucose: 163 mg/dL — ABNORMAL HIGH (ref 70–99)
Potassium: 4.7 mmol/L (ref 3.5–5.2)
Sodium: 140 mmol/L (ref 134–144)
eGFR: 61 mL/min/{1.73_m2} (ref >59)

## 2023-12-03 ENCOUNTER — Encounter: Payer: Self-pay | Admitting: Pharmacist

## 2023-12-03 ENCOUNTER — Telehealth: Payer: Self-pay | Admitting: Pharmacist

## 2023-12-03 ENCOUNTER — Ambulatory Visit: Attending: Cardiology | Admitting: Pharmacist

## 2023-12-03 ENCOUNTER — Other Ambulatory Visit: Payer: Self-pay | Admitting: Internal Medicine

## 2023-12-03 DIAGNOSIS — E785 Hyperlipidemia, unspecified: Secondary | ICD-10-CM

## 2023-12-03 MED ORDER — ATORVASTATIN CALCIUM 10 MG PO TABS: 10.0000 mg | ORAL_TABLET | Freq: Every day | ORAL | 3 refills | Status: DC

## 2023-12-03 NOTE — Progress Notes (Signed)
 Patient ID: Lisa Lee                 DOB: October 23, 1960                    MRN: [id number]      HPI: Lisa Lee is a 63 y.o. female patient referred to lipid clinic and  hypertension clinic by Dr.Thukkani. PMH is significant for MDD, GERD, asthma, HTN, HLD.   Patient could not tolerate any dose higher than 10 mg for Lipitor.  On 4/11 patient went to ED for elevated BP and fear for stroke. Micardis  40 mg daily  was added and patient was advised to check BP at home daily. Post ARB start BMP was stable.   Patient presented today for Lipid clinic. Reports she was having bad nausea from medications and it worsen when she up  the Lipitor dose. Her PCP took her off of all meds for some time and resume back one by one and she figured out she can't tolerate high dose Lipitor. Does ok with 10 mg daily dose so she is taking it along with other 2 cholesterol medications. She follows healthy diet and no exercise. She teaches at high school.  Reviewed options for lowering LDL cholesterol, including , PCSK-9 inhibitors, bempedoic acid and inclisiran.  Discussed mechanisms of action, dosing, side effects and potential decreases in LDL cholesterol.  Also reviewed cost information and potential options for patient assistance.  Current Medications: Lipitor 10 mg daily and Zetia  10 mg daily, fenofibrate  160 mg daily  Intolerances:Lipitor higher than 10 mg dose  Risk Factors: elevated Lp(a), CAD on coronary CT scan, strong family hx of CAD and HLD ( all sibling with HDL and CAD), CKD  LDL goal: <70 mg/dl  Last lab: Lp(a) 130, TG 171, HDL 53.60 LDL 80   Diet:  B-  toast or english muffin with peanut butter  L- [place] sandwich,  Snack- cottage cheese and fruits  Dinner: chicken, vegetables  Social History:  Alcohol: none Smoking : none  Exercise: none  Relaxation Yoga 60 min once a week   Family History:   Relation Problem Comments  Mother (Deceased) Arthritis   Heart disease      Father (Deceased at age 53) Colon cancer   Heart disease   Parkinson's disease   Rectal cancer     Brother  MI at age of 30   Sister (Alive) Heart disease (Age: 26) cabg stent    Sister - cindy (Alive) Heart disease   Thyroid  disease      Labs:  Lipid Panel     Component Value Date/Time   CHOL 168 07/30/2023 1148   CHOL 167 06/03/2019 0840   TRIG 171.0 (H) 07/30/2023 1148   HDL 53.60 07/30/2023 1148   HDL 53 06/03/2019 0840   CHOLHDL 3 07/30/2023 1148   VLDL 34.2 07/30/2023 1148   LDLCALC 80 07/30/2023 1148   LDLCALC 95 03/29/2020 1443   LDLDIRECT 101.0 10/30/2016 1051   LABVLDL 15 06/03/2019 0840    Past Medical History:  Diagnosis Date   Acid reflux    Asthma    Bipolar disorder (HCC)    Depression    Diarrhea 08/09/2011   Poss ibs  Vs other  Related to stress  consdier other eval if needed. Had colonoscoopy per dr Dellis Fermo    High cholesterol    History of hypertension    no meds for last year  per pt on 03-08-2022  HPV in female 1984   Hypothyroidism    PMB (postmenopausal bleeding)    Thyroid  disease     Current Outpatient Medications on File Prior to Visit  Medication Sig Dispense Refill   albuterol  (PROAIR  HFA) 108 (90 Base) MCG/ACT inhaler Inhale 2 puffs into the lungs every 6 (six) hours as needed for wheezing. 1 each 1   alendronate  (FOSAMAX ) 70 MG tablet Take 1 tablet (70 mg total) by mouth every 7 (seven) days. Take with a full glass of water on an empty stomach. (Patient not taking: Reported on 10/31/2023) 12 tablet 3   ALPRAZolam  (XANAX ) 0.5 MG tablet TAKE 1/2 TO 1 TABLET BY MOUTH TWICE DAILY AS NEEDED FOR ANXIETY 60 tablet 1   aspirin EC 81 MG tablet Take 1 tablet (81 mg total) by mouth daily. (Patient not taking: Reported on 09/13/2023)     Cholecalciferol (VITAMIN D ) 2000 units tablet Take 2,000 Units by mouth daily. (Patient not taking: Reported on 09/13/2023)     escitalopram  (LEXAPRO ) 20 MG tablet Take 2 tablets (40 mg total) by mouth daily. 60  tablet 1   estradiol  (ESTRACE) 0.1 MG/GM vaginal cream Place vaginally.     eszopiclone  (LUNESTA ) 2 MG TABS tablet Take 1 tablet (2 mg total) by mouth at bedtime as needed for sleep. Take immediately before bedtime 30 tablet 0   ezetimibe  (ZETIA ) 10 MG tablet Take 1 tablet (10 mg total) by mouth daily. 90 tablet 3   fenofibrate  160 MG tablet Take 1 tablet (160 mg total) by mouth at bedtime. (Patient not taking: Reported on 10/31/2023) 90 tablet 3   levothyroxine  (SYNTHROID ) 75 MCG tablet TAKE 1 TABLET BY MOUTH EVERY DAY BEFORE BREAKFAST 90 tablet 2   loperamide (IMODIUM A-D) 2 MG tablet Take 2 mg by mouth as needed.     mirtazapine  (REMERON ) 15 MG tablet Take 0.5 tablets (7.5 mg total) by mouth at bedtime.     Multiple Vitamins-Minerals (MULTIVITAMIN WITH MINERALS) tablet Take 1 tablet by mouth daily.     OVER THE COUNTER MEDICATION Calcium  supplement 600mg  with vitamin D  500 units- BID     pantoprazole  (PROTONIX ) 40 MG tablet Take 1 tablet (40 mg total) by mouth 2 (two) times daily before a meal. 60 tablet 0   telmisartan  (MICARDIS ) 40 MG tablet Take 1 tablet (40 mg total) by mouth daily. 90 tablet 3   valACYclovir  (VALTREX ) 500 MG tablet TAKE 1 TABLET DAILY FOR    SUPPRESSION AND 1 TABLET   TWO TIMES A DAY FOR 3 DAYS FOR OUTBREAK Strength: 500 mg 90 tablet 3   Current Facility-Administered Medications on File Prior to Visit  Medication Dose Route Frequency Provider Last Rate Last Admin   0.9 %  sodium chloride  infusion  500 mL Intravenous Once Armbruster, Lendon Queen, MD        Allergies  Allergen Reactions   Penicillins Hives, Shortness Of Breath and Swelling    Has patient had a PCN reaction causing immediate rash, facial/tongue/throat swelling, SOB or lightheadedness with hypotension: Yes Has patient had a PCN reaction causing severe rash involving mucus membranes or skin necrosis: No Has patient had a PCN reaction that required hospitalization No Has patient had a PCN reaction occurring  within the last 10 years: No If all of the above answers are "NO", then may proceed with Cephalosporin use.    Ingrezza  [Valbenazine  Tosylate]     Pt reports repetitive body jerking, feeling depress and off balance.    Pravastatin  Other (See  Comments)    Cause muscle aches and cramps   Rosuvastatin  Other (See Comments)    Caused pain in hands, shoulders, back, and indigestion    Assessment/Plan:  1. Hyperlipidemia -  Problem  HYPERLIPIDEMIA   Qualifier: Diagnosis of  By: Bambi Bonine, CMA (AAMA), Michaeline Adolf     HYPERLIPIDEMIA Assessment:  LDL goal: < 55 mg/dl last LDLc 80 mg/dl (02/6577) TG 469 goal <629 mg/dl  Tolerates Zetia  and moderate intensity statins well without any side effects  Intolerance to high intensity statins  Discussed next potential options (PCSK-9 inhibitors, bempedoic acid and inclisiran); cost, dosing efficacy, side effects  Follows healthy diet reiterated importance of exercise   Plan: Continue taking current medications (Lipitor 10 mg daily and Zetia  10 mg daily, fenofibrate  160 mg daily) Will apply for PA for PCSK9i; will inform patient upon approval  Lipid lab due in 2-3 months after starting PCSK9i   Thank you,  Nickola Baron, Pharm.D [facility] [address], [place], [place] [address] Phone: [phone]; Fax: [phone]

## 2023-12-03 NOTE — Patient Instructions (Addendum)
 Your Results:             Your most recent labs Goal  Total Cholesterol 168 < 200  Triglycerides 171 < 150  HDL (happy/good cholesterol) 53.60 > 40  LDL (lousy/bad cholesterol 80 < 55 or <70    Medication changes: Continue taking Lipitor 10 mg daily, fenofibrate  160 mg daily and Zetia  10 mg daily.We will start the process to get PCSK9i (Repatha and Praluent)  covered by your insurance.  Once the prior authorization is complete, we will call you to let you know and confirm pharmacy information.     Praluent is a cholesterol medication that improved your body's ability to get rid of "bad cholesterol" known as LDL. It can lower your LDL up to 60%. It is an injection that is given under the skin every 2 weeks. The most common side effects of Praluent include runny nose, symptoms of the common cold, rarely flu or flu-like symptoms, back/muscle pain in about 3-4% of the patients, and redness, pain, or bruising at the injection site.    Repatha is a cholesterol medication that improved your body's ability to get rid of "bad cholesterol" known as LDL. It can lower your LDL up to 60%! It is an injection that is given under the skin every 2 weeks. The medication often requires a prior authorization from your insurance company. The most common side effects of Repatha include runny nose, symptoms of the common cold, rarely flu or flu-like symptoms, back/muscle pain in about 3-4% of the patients, and redness, pain, or bruising at the injection site.   Lab orders: We want to repeat labs after 2-3 months.  We will send you a lab order to remind you once we get closer to that time.

## 2023-12-03 NOTE — Assessment & Plan Note (Signed)
 Assessment:  LDL goal: < 55 mg/dl last LDLc 80 mg/dl (16/1096) TG 045 goal <409 mg/dl  Tolerates Zetia  and moderate intensity statins well without any side effects  Intolerance to high intensity statins  Discussed next potential options (PCSK-9 inhibitors, bempedoic acid and inclisiran); cost, dosing efficacy, side effects  Follows healthy diet reiterated importance of exercise   Plan: Continue taking current medications (Lipitor 10 mg daily and Zetia  10 mg daily, fenofibrate  160 mg daily) Will apply for PA for PCSK9i; will inform patient upon approval  Lipid lab due in 2-3 months after starting PCSK9i

## 2023-12-04 ENCOUNTER — Other Ambulatory Visit (HOSPITAL_COMMUNITY): Payer: Self-pay

## 2023-12-04 ENCOUNTER — Telehealth: Payer: Self-pay

## 2023-12-04 DIAGNOSIS — E785 Hyperlipidemia, unspecified: Secondary | ICD-10-CM

## 2023-12-04 MED ORDER — REPATHA SURECLICK 140 MG/ML ~~LOC~~ SOAJ: 140.0000 mg | SUBCUTANEOUS | 3 refills | Status: AC

## 2023-12-04 NOTE — Telephone Encounter (Signed)
 PA request has been Submitted. New Encounter has been or will be created for follow up. For additional info see Pharmacy Prior Auth telephone encounter from 12/04/23.

## 2023-12-04 NOTE — Addendum Note (Signed)
 Addended by: Ksenia Kunz K on: 12/04/2023 09:42 AM   Modules accepted: Orders

## 2023-12-04 NOTE — Telephone Encounter (Signed)
 Pharmacy Patient Advocate Encounter  Received notification from [facility] that Prior Authorization for REPATHA has been APPROVED from 12/04/23 to 12/03/24. Ran test claim, Copay is $30. This test claim was processed through [facility]- copay amounts may vary at other pharmacies due to pharmacy/plan contracts, or as the patient moves through the different stages of their insurance plan.

## 2023-12-04 NOTE — Telephone Encounter (Signed)
 Pharmacy Patient Advocate Encounter   Received notification from Physician's Office that prior authorization for REPATHA is required/requested.   Insurance verification completed.   The patient is insured through [facility] .   Per test claim: PA required; PA submitted to above mentioned insurance via CoverMyMeds Key/confirmation #/EOC [id number] Status is pending

## 2023-12-04 NOTE — Telephone Encounter (Signed)
 Pt made aware of Repatha prescription. Pt wants to send prescription to [facility] pharmacy. Follow up lab due July 16,2025

## 2023-12-10 ENCOUNTER — Encounter: Payer: Self-pay | Admitting: Internal Medicine

## 2023-12-13 ENCOUNTER — Telehealth: Payer: Self-pay | Admitting: Physician Assistant

## 2023-12-13 NOTE — Telephone Encounter (Signed)
 Patient lvm stating that the Lunesta  didn't help her sleep. She is requesting refills on the Xanax , and the Mirtazapine . She is also requesting a 90 day supply of Lexapro  instead of 30.

## 2023-12-16 ENCOUNTER — Other Ambulatory Visit: Payer: Self-pay

## 2023-12-16 ENCOUNTER — Other Ambulatory Visit: Payer: Self-pay | Admitting: Physician Assistant

## 2023-12-16 MED ORDER — ALPRAZOLAM 0.5 MG PO TABS: 0.2500 mg | ORAL_TABLET | Freq: Two times a day (BID) | ORAL | 0 refills | Status: DC | PRN

## 2023-12-16 NOTE — Telephone Encounter (Signed)
 RF for mirtazapine  sent. Xanax  pended. [facility].

## 2023-12-16 NOTE — Telephone Encounter (Signed)
 Pt reports the Lunesta  was not helping for sleep, but said the combo of mirtazapine  and Xanax  worked well. She is asking for RF. She also reports taking magnesium glycinate at bedtime.   She asked about getting a 90-day fill of Lexapro  instead of a 30-day fill. Provider sent as 30-day. She has an appt next week and told her we could probably send a 90-day supply at that time. She was agreeable to this.

## 2023-12-16 NOTE — Telephone Encounter (Signed)
 10/31/2023 10/31/2023 1  Eszopiclone  2 Mg Tablet 30.00 30 [place] [id number] Fri (6540) 0/0 0.66 LME Comm Ins [place] 09/02/2023 06/21/2023 1  Alprazolam  0.5 Mg Tablet 60.00 30

## 2023-12-28 ENCOUNTER — Ambulatory Visit (HOSPITAL_COMMUNITY): Payer: Self-pay | Admitting: Psychiatry

## 2024-01-01 ENCOUNTER — Telehealth: Admitting: Physician Assistant

## 2024-01-01 ENCOUNTER — Encounter: Payer: Self-pay | Admitting: Physician Assistant

## 2024-01-01 DIAGNOSIS — G2571 Drug induced akathisia: Secondary | ICD-10-CM | POA: Diagnosis not present

## 2024-01-01 DIAGNOSIS — F411 Generalized anxiety disorder: Secondary | ICD-10-CM

## 2024-01-01 DIAGNOSIS — G2401 Drug induced subacute dyskinesia: Secondary | ICD-10-CM | POA: Diagnosis not present

## 2024-01-01 DIAGNOSIS — F319 Bipolar disorder, unspecified: Secondary | ICD-10-CM

## 2024-01-01 MED ORDER — CLONAZEPAM 0.5 MG PO TABS: 0.2500 mg | ORAL_TABLET | Freq: Two times a day (BID) | ORAL | 0 refills | Status: DC | PRN

## 2024-01-01 MED ORDER — ESCITALOPRAM OXALATE 20 MG PO TABS: 40.0000 mg | ORAL_TABLET | Freq: Every day | ORAL | 1 refills | Status: DC

## 2024-01-01 NOTE — Progress Notes (Signed)
 [facility]  Patient ID: SIMONNE BOULOS,  MRN: [id number]  PCP: Panosh, Wanda K, MD  Date of Evaluation: 01/01/2024 Time spent:25 minutes  Chief Complaint:  Chief Complaint   Anxiety; Follow-up    Virtual Visit via Telehealth  I connected with patient by a video enabled telemedicine application with their informed consent, and verified patient privacy and that I am speaking with the correct person using two identifiers.  I am private, in my office and the patient is at work.  I discussed the limitations, risks, security and privacy concerns of performing an evaluation and management service by video and the availability of in person appointments. I also discussed with the patient that there may be a patient responsible charge related to this service. The patient expressed understanding and agreed to proceed.   I discussed the assessment and treatment plan with the patient. The patient was provided an opportunity to ask questions and all were answered. The patient agreed with the plan and demonstrated an understanding of the instructions.   The patient was advised to call back or seek an in-person evaluation if the symptoms worsen or if the condition fails to improve as anticipated.  I provided 25 minutes of non-face-to-face time during this encounter.   HISTORY/CURRENT STATUS: For routine med check.    Still has anxiety. Not really any better since the increase of Lexapro  2 months ago. Feels overwhelmed a lot, every day. No PA but feels on edge.  The xanax  isn't helping like it used to.   No c/o depression.  Energy and motivation are fair. 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  She's looking forward to a much needed rest.   No extreme sadness, tearfulness, or feelings of hopelessness.  Sleeps well most of the time. ADLs and personal hygiene are normal.   Denies any changes in concentration, making decisions, or remembering things.  Appetite has not changed.  Weight  is stable. Denies suicidal or homicidal thoughts.  Patient denies increased energy with decreased need for sleep, increased talkativeness, racing thoughts, impulsivity or risky behaviors, increased spending, increased libido, grandiosity, increased irritability or anger, paranoia, or hallucinations.  Continues to have abnl mouth movements.  Not biting her tongue or causing sores in her mouth. Mostly muscles of expression. The movements in torso are better. Denies dizziness, syncope, seizures, numbness, tingling, tremor, tics, unsteady gait, slurred speech, confusion.   Individual Medical History/ Review of Systems: Changes? :No    Past medications for mental health diagnoses include: Lexapro , lithium , Xanax , Modafanil, Ingrezza  caused depression and sedation (pt feels like it made movements worse,) Mirtazapine , Latuda  helped  Allergies: Penicillins, Ingrezza  [valbenazine  tosylate], Pravastatin , and Rosuvastatin   Current Medications:  Current Outpatient Medications:    albuterol  (PROAIR  HFA) 108 (90 Base) MCG/ACT inhaler, Inhale 2 puffs into the lungs every 6 (six) hours as needed for wheezing., Disp: 1 each, Rfl: 1   atorvastatin  (LIPITOR) 10 MG tablet, Take 1 tablet (10 mg total) by mouth daily., Disp: 90 tablet, Rfl: 3   clonazePAM  (KLONOPIN ) 0.5 MG tablet, Take 0.5-1 tablets (0.25-0.5 mg total) by mouth 2 (two) times daily as needed for anxiety., Disp: 60 tablet, Rfl: 0   estradiol  (ESTRACE) 0.1 MG/GM vaginal cream, Place vaginally., Disp: , Rfl:    Evolocumab  (REPATHA  SURECLICK) 140 MG/ML SOAJ, Inject 140 mg into the skin every 14 (fourteen) days., Disp: 6 mL, Rfl: 3   ezetimibe  (ZETIA ) 10 MG tablet, Take 1 tablet (10 mg total) by mouth daily., Disp: 90  tablet, Rfl: 3   levothyroxine  (SYNTHROID ) 75 MCG tablet, TAKE 1 TABLET BY MOUTH EVERY DAY BEFORE BREAKFAST, Disp: 90 tablet, Rfl: 2   loperamide (IMODIUM A-D) 2 MG tablet, Take 2 mg by mouth as needed., Disp: , Rfl:    mirtazapine   (REMERON ) 15 MG tablet, TAKE 1/2 TO 1 TABLET BY MOUTH AT BEDTIME AS NEEDED, Disp: 30 tablet, Rfl: 0   Multiple Vitamins-Minerals (MULTIVITAMIN WITH MINERALS) tablet, Take 1 tablet by mouth daily., Disp: , Rfl:    OVER THE COUNTER MEDICATION, Calcium  supplement 600mg  with vitamin D  500 units- BID, Disp: , Rfl:    pantoprazole  (PROTONIX ) 40 MG tablet, Take 1 tablet (40 mg total) by mouth 2 (two) times daily before a meal., Disp: 60 tablet, Rfl: 0   telmisartan  (MICARDIS ) 40 MG tablet, Take 1 tablet (40 mg total) by mouth daily., Disp: 90 tablet, Rfl: 3   valACYclovir  (VALTREX ) 500 MG tablet, TAKE 1 TABLET DAILY FOR SUPPRESSION AND 1 TABLET TWO TIMES A DAY FOR 3 DAYS FOR OUTBREAK Strength: 500 mg, Disp: 90 tablet, Rfl: 3   Cholecalciferol (VITAMIN D ) 2000 units tablet, Take 2,000 Units by mouth daily. (Patient not taking: Reported on 09/13/2023), Disp: , Rfl:    escitalopram  (LEXAPRO ) 20 MG tablet, Take 2 tablets (40 mg total) by mouth daily., Disp: 60 tablet, Rfl: 1   fenofibrate  160 MG tablet, Take 1 tablet (160 mg total) by mouth at bedtime. (Patient not taking: Reported on 09/13/2023), Disp: 90 tablet, Rfl: 3  Current Facility-Administered Medications:    0.9 %  sodium chloride  infusion, 500 mL, Intravenous, Once, Armbruster, Lendon Queen, MD Medication Side Effects: none  Family Medical/ Social History: Changes?  none  MENTAL HEALTH EXAM:  Last menstrual period 12/09/2013.There is no height or weight on file to calculate BMI.  General Appearance: Casual and Well Groomed  Eye Contact:  Good  Speech:  Clear and Coherent and Normal Rate  Volume:  Normal  Mood:  Euthymic  Affect:  Congruent  Thought Process:  Goal Directed and Descriptions of Associations: Circumstantial  Orientation:  Full (Time, Place, and Person)  Thought Content: Logical   Suicidal Thoughts:  No  Homicidal Thoughts:  No  Memory:  WNL  Judgement:  Good  Insight:  Good  Psychomotor Activity:  TD w/ involuntary contraction  right facial muscle causing 1/2 smile  Concentration:  Concentration: Good and Attention Span: Good  Recall:  Good  Fund of Knowledge: Good  Language: Good  Assets:  Desire for Improvement Financial Resources/Insurance Housing Transportation Vocational/Educational  ADL's:  Intact  Cognition: WNL  Prognosis:  Good   AIMS    Flowsheet Row Video Visit from 10/31/2023 in [facility] Office Visit from 09/13/2023 in [facility] Office Visit from 07/30/2023 in [facility] Office Visit from 06/21/2023 in [facility] Office Visit from 05/21/2023 in [facility]  AIMS Total Score 7 4 7 18 17       GAD-7    Flowsheet Row Office Visit from 07/09/2023 in [facility] [place]  Total GAD-7 Score 4      PHQ2-9    Flowsheet Row Office Visit from 07/09/2023 in [facility] [place] Office Visit from 04/23/2022 in [facility] [place]  PHQ-2 Total Score 0 0  PHQ-9 Total Score 3 2      Flowsheet Row ED from 11/08/2023 in [facility] Emergency Department  at [facility] Admission (Discharged) from 03/16/2022 in [facility]  C-SSRS RISK CATEGORY No Risk No Risk      DIAGNOSES:    ICD-10-CM   1. Bipolar I disorder (HCC)  F31.9     2. Generalized anxiety disorder  F41.1     3. Akathisia  G25.71     4. Tardive dyskinesia  G24.01       Receiving Psychotherapy: No   RECOMMENDATIONS:  PDMP was reviewed.  Last Xanax  filled 12/16/2023.  Lunesta  filled 10/31/2023. I provided 25  minutes of non-face-to-face time during this encounter, including time spent before and after the visit in records review, medical decision making, counseling pertinent to today's visit, and charting.   We discussed the anxiety.  I recommend stopping Xanax  and changing to Klonopin . The effects usually last  longer, but it take a little longer than Xanax  to start working. She would like to try it.   Refuses Austedo since she had such a bad time from the ingrezza .   She will be seeing Dr. Eappen on 01/27/2024 for a 2nd opinion. They may decide to make other changes so I won't make other changes now.   Start klonopin  0.5 mg, 1/2-1 bid prn. Continue Lexapro  20 mg, 2 pills p.o. every morning. Continue Lunesta  2 mg, 1 at bedtime prn.  Continue Mirtzapine 15 mg, 1/2 at bedtime.  Return in 2-3 months, or if she prefers to cont care with Dr. Tere Felts, cancel appt w/ me.   Marvia Slocumb, PA-C

## 2024-01-13 ENCOUNTER — Encounter: Payer: Self-pay | Admitting: Pharmacist

## 2024-01-13 ENCOUNTER — Ambulatory Visit: Attending: Cardiovascular Disease | Admitting: Pharmacist

## 2024-01-13 VITALS — BP 104/65 | HR 73

## 2024-01-13 DIAGNOSIS — E785 Hyperlipidemia, unspecified: Secondary | ICD-10-CM | POA: Diagnosis not present

## 2024-01-13 DIAGNOSIS — I158 Other secondary hypertension: Secondary | ICD-10-CM | POA: Diagnosis not present

## 2024-01-13 DIAGNOSIS — I10 Essential (primary) hypertension: Secondary | ICD-10-CM | POA: Insufficient documentation

## 2024-01-13 NOTE — Assessment & Plan Note (Addendum)
 Assessment: BP is controlled in office BP 104/65 mmHg heart rate 73 (goal <130/80) Home BP monitor is accurate ~ 116/68 heart rate 78  Takes telmisartan  40 mg daily and tolerates it well - post initiation K level and renal function was WNL  Denies SOB, palpitation, chest pain, headaches,or swelling Reiterated the importance of regular exercise and low salt diet   Plan:  Continue taking Micardis  40 mg daily  Patient to keep record of BP readings with heart rate and report to us  at the next visit Follow up with Dr.Thukkani in 6-8 months

## 2024-01-13 NOTE — Patient Instructions (Signed)
 No Changes made to your BP medication by your pharmacist Nickola Baron, PharmD at today's visit:    HOW TO TAKE YOUR BLOOD PRESSURE AT HOME  Rest 5 minutes before taking your blood pressure.  Don't smoke or drink caffeinated beverages for at least 30 minutes before. Take your blood pressure before (not after) you eat. Sit comfortably with your back supported and both feet on the floor (don't cross your legs). Elevate your arm to heart level on a table or a desk. Use the proper sized cuff. It should fit smoothly and snugly around your bare upper arm. There should be enough room to slip a fingertip under the cuff. The bottom edge of the cuff should be 1 inch above the crease of the elbow. Ideally, take 3 measurements at one sitting and record the average.  Important lifestyle changes to control high blood pressure  Intervention  Effect on the BP  Lose extra pounds and watch your waistline Weight loss is one of the most effective lifestyle changes for controlling blood pressure. If you're overweight or obese, losing even a small amount of weight can help reduce blood pressure. Blood pressure might go down by about 1 millimeter of mercury (mm Hg) with each kilogram (about 2.2 pounds) of weight lost.  Exercise regularly As a general goal, aim for at least 30 minutes of moderate physical activity every day. Regular physical activity can lower high blood pressure by about 5 to 8 mm Hg.  Eat a healthy diet Eating a diet rich in whole grains, fruits, vegetables, and low-fat dairy products and low in saturated fat and cholesterol. A healthy diet can lower high blood pressure by up to 11 mm Hg.  Reduce salt (sodium) in your diet Even a small reduction of sodium in the diet can improve heart health and reduce high blood pressure by about 5 to 6 mm Hg.  Limit alcohol One drink equals 12 ounces of beer, 5 ounces of wine, or 1.5 ounces of 80-proof liquor.  Limiting alcohol to less than one drink a day for  women or two drinks a day for men can help lower blood pressure by about 4 mm Hg.   If you have any questions or concerns please use My Chart to send questions or call the office at [phone]

## 2024-01-13 NOTE — Progress Notes (Signed)
 Patient ID: Cinthia Rodden Lee                 DOB: 08-31-1960                      MRN: [id number]      HPI: Lisa Lee is a 63 y.o. female referred by Dr. Lorie Rook to HTN clinic. PMH is significant for MDD, GERD, asthma, HTN, HLD.   Patient could not tolerate any dose higher than 10 mg for Lipitor. On 4/11 patient went to ED for elevated BP and fear for stroke. Micardis  40 mg daily was added and patient was advised to check BP at home daily. Post ARB start BMP was stable. Patient presented today for follow up and for home BP machine validation. Home BP ~ 116/68 heart rate 78. She tolerates Micardis  well and follow low salt diet. In her home readings there was one low BP reading 85/56 but she felt fine that was on one of the hot day and she may be dehydrated. Suggest to keep up with enough water intake  Current HTN meds: telmisartan  40 mg daily  Previously tried: none  BP goal: <130/80   Family History:  Relation Problem Comments  Mother (Deceased) Arthritis   Heart disease     Father (Deceased at age 62) Colon cancer   Heart disease   Parkinson's disease   Rectal cancer     Sister (Alive) Heart disease (Age: 43) cabg stent    Sister - cindy (Alive) Heart disease   Thyroid  disease        Diet: low salt low fat diet, husband has lot of heart problems so vigilant on diet   Exercise: none was doing yoga. 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   Home BP readings:  SBP DBP  HR  85 59 73  111 64 77  110 66 77  130 76 68  132 80 65  112 59 74  106 55 79  132 71 71  114  69 82  117 71 82  121 75 92  120 69 94  115.8333 78.29562 13.08657     Wt Readings from Last 3 Encounters:  11/08/23 126 lb (57.2 kg)  09/11/23 130 lb (59 kg)  09/04/23 129 lb (58.5 kg)   BP Readings from Last 3 Encounters:  01/13/24 104/65  11/08/23 (!) 112/55  09/11/23 118/76   Pulse Readings from Last 3 Encounters:  01/13/24 73   11/08/23 66  09/11/23 71    Renal function: CrCl cannot be calculated (Patient's most recent lab result is older than the maximum 21 days allowed.).  Past Medical History:  Diagnosis Date   Acid reflux    Asthma    Bipolar disorder (HCC)    Depression    Diarrhea 08/09/2011   Poss ibs  Vs other  Related to stress  consdier other eval if needed. Had colonoscoopy per dr Dellis Fermo    High cholesterol    History of hypertension    no meds for last year  per pt on 03-08-2022   HPV in female 1984   Hypothyroidism    PMB (postmenopausal bleeding)    Thyroid  disease     Current Outpatient Medications on File Prior to Visit  Medication Sig Dispense Refill   albuterol  (PROAIR  HFA) 108 (90 Base) MCG/ACT inhaler Inhale 2 puffs into the lungs every 6 (six) hours as needed for wheezing. 1 each  1   atorvastatin  (LIPITOR) 10 MG tablet Take 1 tablet (10 mg total) by mouth daily. 90 tablet 3   Cholecalciferol (VITAMIN D ) 2000 units tablet Take 2,000 Units by mouth daily. (Patient not taking: Reported on 09/13/2023)     clonazePAM  (KLONOPIN ) 0.5 MG tablet Take 0.5-1 tablets (0.25-0.5 mg total) by mouth 2 (two) times daily as needed for anxiety. 60 tablet 0   escitalopram  (LEXAPRO ) 20 MG tablet Take 2 tablets (40 mg total) by mouth daily. 60 tablet 1   estradiol  (ESTRACE) 0.1 MG/GM vaginal cream Place vaginally.     Evolocumab  (REPATHA  SURECLICK) 140 MG/ML SOAJ Inject 140 mg into the skin every 14 (fourteen) days. 6 mL 3   ezetimibe  (ZETIA ) 10 MG tablet Take 1 tablet (10 mg total) by mouth daily. 90 tablet 3   fenofibrate  160 MG tablet Take 1 tablet (160 mg total) by mouth at bedtime. (Patient not taking: Reported on 09/13/2023) 90 tablet 3   levothyroxine  (SYNTHROID ) 75 MCG tablet TAKE 1 TABLET BY MOUTH EVERY DAY BEFORE BREAKFAST 90 tablet 2   loperamide (IMODIUM A-D) 2 MG tablet Take 2 mg by mouth as needed.     mirtazapine  (REMERON ) 15 MG tablet TAKE 1/2 TO 1 TABLET BY MOUTH AT BEDTIME AS NEEDED 30  tablet 0   Multiple Vitamins-Minerals (MULTIVITAMIN WITH MINERALS) tablet Take 1 tablet by mouth daily.     OVER THE COUNTER MEDICATION Calcium  supplement 600mg  with vitamin D  500 units- BID     pantoprazole  (PROTONIX ) 40 MG tablet Take 1 tablet (40 mg total) by mouth 2 (two) times daily before a meal. 60 tablet 0   telmisartan  (MICARDIS ) 40 MG tablet Take 1 tablet (40 mg total) by mouth daily. 90 tablet 3   valACYclovir  (VALTREX ) 500 MG tablet TAKE 1 TABLET DAILY FOR SUPPRESSION AND 1 TABLET TWO TIMES A DAY FOR 3 DAYS FOR OUTBREAK Strength: 500 mg 90 tablet 3   Current Facility-Administered Medications on File Prior to Visit  Medication Dose Route Frequency Provider Last Rate Last Admin   0.9 %  sodium chloride  infusion  500 mL Intravenous Once Armbruster, Lendon Queen, MD        Allergies  Allergen Reactions   Penicillins Hives, Shortness Of Breath and Swelling    Has patient had a PCN reaction causing immediate rash, facial/tongue/throat swelling, SOB or lightheadedness with hypotension: Yes Has patient had a PCN reaction causing severe rash involving mucus membranes or skin necrosis: No Has patient had a PCN reaction that required hospitalization No Has patient had a PCN reaction occurring within the last 10 years: No If all of the above answers are NO, then may proceed with Cephalosporin use.    Ingrezza  [Valbenazine  Tosylate]     Pt reports repetitive body jerking, feeling depress and off balance.    Pravastatin  Other (See Comments)    Cause muscle aches and cramps   Rosuvastatin  Other (See Comments)    Caused pain in hands, shoulders, back, and indigestion    Blood pressure 104/65, pulse 73, last menstrual period 12/09/2013, SpO2 98%.   Assessment/Plan:  1. Hypertension -  Hypertension Assessment: BP is controlled in office BP 104/65 mmHg heart rate 73 (goal <130/80) Home BP monitor is accurate ~ 116/68 heart rate 78  Takes telmisartan  40 mg daily and tolerates it well -  post initiation K level and renal function was WNL  Denies SOB, palpitation, chest pain, headaches,or swelling Reiterated the importance of regular exercise and low salt diet  Plan:  Continue taking Micardis  40 mg daily  Patient to keep record of BP readings with heart rate and report to us  at the next visit Follow up with Dr.Thukkani in 6-8 months        Thank you  Nickola Baron, Pharm.D [place] [facility]. [facility] [address], [place], [place] [address] Phone: [phone]; Fax: [phone]

## 2024-01-20 ENCOUNTER — Other Ambulatory Visit: Payer: Self-pay | Admitting: Physician Assistant

## 2024-01-27 ENCOUNTER — Ambulatory Visit (INDEPENDENT_AMBULATORY_CARE_PROVIDER_SITE_OTHER): Payer: Self-pay | Admitting: Psychiatry

## 2024-01-27 ENCOUNTER — Other Ambulatory Visit: Payer: Self-pay

## 2024-01-27 ENCOUNTER — Encounter: Payer: Self-pay | Admitting: Psychiatry

## 2024-01-27 ENCOUNTER — Other Ambulatory Visit
Admission: RE | Admit: 2024-01-27 | Discharge: 2024-01-27 | Disposition: A | Discharge status: Home / Self Care | Source: Ambulatory Visit | Attending: Psychiatry | Admitting: Psychiatry

## 2024-01-27 VITALS — BP 113/70 | HR 79 | Temp 96.4°F | Ht 61.0 in | Wt 131.0 lb

## 2024-01-27 DIAGNOSIS — R259 Unspecified abnormal involuntary movements: Secondary | ICD-10-CM | POA: Diagnosis not present

## 2024-01-27 DIAGNOSIS — G2401 Drug induced subacute dyskinesia: Secondary | ICD-10-CM | POA: Insufficient documentation

## 2024-01-27 DIAGNOSIS — Z79899 Other long term (current) drug therapy: Secondary | ICD-10-CM | POA: Insufficient documentation

## 2024-01-27 DIAGNOSIS — G4700 Insomnia, unspecified: Secondary | ICD-10-CM | POA: Diagnosis not present

## 2024-01-27 DIAGNOSIS — F0630 Mood disorder due to known physiological condition, unspecified: Secondary | ICD-10-CM | POA: Diagnosis not present

## 2024-01-27 DIAGNOSIS — F411 Generalized anxiety disorder: Secondary | ICD-10-CM | POA: Diagnosis not present

## 2024-01-27 MED ORDER — ESCITALOPRAM OXALATE 20 MG PO TABS: 10.0000 mg | ORAL_TABLET | Freq: Every day | ORAL | Status: DC

## 2024-01-27 MED ORDER — MIRTAZAPINE 15 MG PO TABS: 15.0000 mg | ORAL_TABLET | Freq: Every day | ORAL | 1 refills | Status: DC

## 2024-01-27 NOTE — Patient Instructions (Signed)
  [email or web address]  [email or web address]  [email or web address] [facility] [address], [place], [place] [address]   [phone]  [facility] [email or web address] [address], [place], [place] [address]  [phone]  [facility] [email or web address] [address] [place], [place] [address]  [phone]   [facility] - [phone]  [facility] - [phone]  [facility] - [phone] counseling [phone]  [facility] - [phone]   [facility] has interns who offer sliding scale rates and some of the full time clinicians do, as well. You complete their contact form on their website and the referrals coordinator will help to get connected to someone   [email or web address] [phone]  Medicaid below :  [facility] [address] [place], [place] [address]  [phone]    Estela Held [address] [place], [place] [address]  [phone]    [facility] [address] [place], [place] [address]  [phone]

## 2024-01-27 NOTE — Progress Notes (Unsigned)
 Psychiatric Initial Adult Assessment   Patient Identification: Lisa Lee MRN:  [id number] Date of Evaluation:  01/27/2024 Referral Source: Apolinar Eastern MD Chief Complaint:   Chief Complaint  Patient presents with   Establish Care   Visit Diagnosis:    ICD-10-CM   1. Mood disorder in conditions classified elsewhere  F06.30 escitalopram  (LEXAPRO ) 20 MG tablet    mirtazapine  (REMERON ) 15 MG tablet    2. Generalized anxiety disorder  F41.1 escitalopram  (LEXAPRO ) 20 MG tablet    mirtazapine  (REMERON ) 15 MG tablet    3. Insomnia, unspecified type  G47.00 mirtazapine  (REMERON ) 15 MG tablet    4. Tardive dyskinesia  G24.01     5. High risk medication use  Z79.899 Urine drugs of abuse scrn w alc, routine (Ref Lab)      HPI: Lisa Lee is a 62 year old Caucasian female, married, currently employed, lives in [place], has a history of multiple psychiatric diagnoses including bipolar disorder, generalized anxiety disorder, akathisia, tardive dyskinesia, hypertension, hyperlipidemia, gastroesophageal reflux disease, asthma, hypothyroidism was evaluated in office today, presented to establish care.  She used to be under the care of Ms.Verneita Cooks PA at [facility].  She wanted a second opinion and requested this appointment.  She has a past history of mood lability which includes episodic irritability, anger issues which were extreme and resulted in throwing property and punching her partner.  She reports these symptoms usually lasted for just one minute happened.  She denies episodes of high energy, spending sprees, decreased need for sleep or other manic symptoms.  She does report depression symptoms when she struggled with extreme low mood which resulted in low motivation, low energy, sadness.  She did struggle with passive suicidality when she was a child however reports she never came up with a plan and never attempted it.  Currently she is not experiencing  significant depression symptoms.  She struggles with anxiety mostly described as internal restlessness and feeling overwhelmed or on edge.  This has been going on since the past several months and nothing seems to be effective.  She denies being a Product/process development scientist and denies any panic attacks.  She was tried on a higher dosage of Lexapro  currently taking a 40 mg which also does not seem to be beneficial.  She does struggle with sleep.  Initially may have been related to her work schedule since she worked night shifts as well as day shifts back and forth.  She did well on Latuda  when she was tried on that however since Latuda  caused abnormal involuntary movements and she was taken off of it she has struggled with significant sleep problems since then.  In the past she struggled with staying awake during the day and may have tried modafinil .  She currently denies having that problem.  She denies snoring however continues to struggle with fatigue during the day.  Has never had a sleep study previously.  She does have a history of picking at her nails although not too distressing at this time.  She does struggle with abnormal involuntary movements of her face, around her eyebrows, her nose as well as her mouth.  It may have improved some compared to previously although she continues to have these movements and current medications are not beneficial.  She tried Ingrezza  however developed side effects to it.  She is not interested in trial of another medication to target these movement problems.  Discussed referral for neurology, currently declines.  She may have seen a neurologist  previously Dr. Evonnie in [place].  Recently was changed from Xanax  to clonazepam  and is cautious about taking it since she does not want to be on any medication that is habit-forming.  Currently also takes mirtazapine  7.5 mg at bedtime.    She denies any history of trauma.  Denies any obsessions or compulsive behaviors.  Denies any  current suicidality, homicidality or perceptual disturbances.    Associated Signs/Symptoms: Depression Symptoms:  depressed mood, anxiety, loss of energy/fatigue, (Hypo) Manic Symptoms:  Irritable Mood, Anxiety Symptoms:  Internal anxiety and restlessness Psychotic Symptoms:  Denies PTSD Symptoms: Denies  Past Psychiatric History: Previously diagnosed with postpartum depression in 1995.  Has been under the care of psychiatrist and therapist on and off.  He has been seeing [facility] in [place].  Most recently with [facility] in [place].  Does report a history of suicidality as a child.  Denies any suicide attempts or self-injurious behaviors.  Previous Psychotropic Medications: Yes multiple trials of medications previously including Lunesta , Lexapro , Latuda , lithium , modafinil , Xanax   Substance Abuse History in the last 12 months:  No.  Consequences of Substance Abuse: Negative  Past Medical History:  Past Medical History:  Diagnosis Date   Acid reflux    Asthma    Bipolar disorder (HCC)    Depression    Diarrhea 08/09/2011   Poss ibs  Vs other  Related to stress  consdier other eval if needed. Had colonoscoopy per dr Donnald    High cholesterol    History of hypertension    no meds for last year  per pt on 03-08-2022   HPV in female 1984   Hypothyroidism    PMB (postmenopausal bleeding)    Thyroid  disease     Past Surgical History:  Procedure Laterality Date   CERVICAL CONIZATION W/BX N/A 07/21/2015   Procedure: CONIZATION CERVIX WITH BIOPSY;  Surgeon: Rome Rigg, MD;  Location: [facility];  Service: Gynecology;  Laterality: N/A;   CERVICAL CONIZATION W/BX N/A 09/25/2018   Procedure: CONIZATION CERVIX WITH BIOPSY;  Surgeon: Eloy Herring, MD;  Location: [facility];  Service: Gynecology;  Laterality: N/A;   CERVICAL CRYOTHERAPY  07/30/1982   colonscopy  07/04/2018   COLPOSCOPY  08/26/2013   DILATATION &  CURETTAGE/HYSTEROSCOPY WITH MYOSURE N/A 03/16/2022   Procedure: ATTEMPTED DILATATION & CURETTAGE/ATTEMPTED HYSTEROSCOPY WITH MYOSURE, POLYPECTOMY;  Surgeon: Estelle Service, MD;  Location: [facility];  Service: Gynecology;  Laterality: N/A;   DILATION AND CURETTAGE, DIAGNOSTIC / THERAPEUTIC  07/30/1992   Blighted Ovum   HYSTEROSCOPY     uterine polypectomy Jan 7th   HYSTEROSCOPY WITH RESECTOSCOPE  08/04/2009   Removed polyp & IUD   LYMPH NODE BIOPSY     40 yrs ago   OPERATIVE ULTRASOUND N/A 03/16/2022   Procedure: OPERATIVE ULTRASOUND;  Surgeon: Estelle Service, MD;  Location[facility];  Service: Gynecology;  Laterality: N/A;   TONSILLECTOMY     as child   UPPER GASTROINTESTINAL ENDOSCOPY  01/11/2023   UPPER GI ENDOSCOPY  07/04/2018    Family Psychiatric History: As noted below.  Family History:  Family History  Problem Relation Age of Onset   Depression Mother    Arthritis Mother    Heart disease Mother    Rectal cancer Father    Heart disease Father    Colon cancer Father    Parkinson's disease Father    Depression Sister    Heart disease Sister 51       cabg  stent   Depression Sister    Heart disease Sister    Thyroid  disease Sister    Heart disease Brother    Suicidality Cousin    Drug abuse Daughter    Anxiety disorder Daughter    Depression Daughter    Pulmonary embolism Daughter    Anxiety disorder Daughter    Thyroid  disease Daughter    Esophageal cancer Neg Hx    Stomach cancer Neg Hx     Social History:   Social History   Socioeconomic History   Marital status: Married    Spouse name: Not on file   Number of children: Not on file   Years of education: Not on file   Highest education level: Bachelor's degree (e.g., BA, AB, BS)  Occupational History   Not on file  Tobacco Use   Smoking status: Never   Smokeless tobacco: Never  Vaping Use   Vaping status: Never Used  Substance and Sexual Activity   Alcohol  use: Yes    Alcohol/week: 0.0 standard drinks of alcohol    Comment: monthly ocassional drink   Drug use: No   Sexual activity: Yes    Birth control/protection: None  Other Topics Concern   Not on file  Social History Narrative    [employer] 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    Divorced   Regular exercise-  Not as much recently    [facility] [employer] [facility] of 2   Pets 2 cats 1 dog to move   Daughter  On recovery heroin   Social Drivers of Corporate investment banker Strain: Low Risk  (07/29/2023)   Overall Financial Resource Strain (CARDIA)    Difficulty of Paying Living Expenses: Not hard at all  Food Insecurity: No Food Insecurity (07/29/2023)   Hunger Vital Sign    Worried About Running Out of Food in the Last Year: Never true    Ran Out of Food in the Last Year: Never true  Transportation Needs: No Transportation Needs (07/29/2023)   PRAPARE - Administrator, Civil Service (Medical): No    Lack of Transportation (Non-Medical): No  Physical Activity: Insufficiently Active (07/29/2023)   Exercise Vital Sign    Days of Exercise per Week: 2 days    Minutes of Exercise per Session: 60 min  Stress: No Stress Concern Present (07/29/2023)   [employer] of Occupational Health - Occupational Stress Questionnaire    Feeling of Stress : Not at all  Social Connections: Moderately Isolated (07/29/2023)   Social Connection and Isolation Panel    Frequency of Communication with Friends and Family: More than three times a week    Frequency of Social Gatherings with Friends and Family: Once a week    Attends Religious Services: Never    Database administrator or Organizations: No    Attends Engineer, structural: Not on file    Marital Status: Married    Additional Social History: Raised in [place] .  Raised by both parents.  Has 5 siblings.  Had a good childhood.  Got a bachelor of nursing degree and has worked as an Charity fundraiser.  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  Has been married 3 times, divorced twice.  Currently lives with her husband in [place].  2 daughters and 2 grandchildren.  Reports she believes in God.  Denies access to a gun.  No  legal problems.  Never been in [employer].  Allergies:   Allergies  Allergen Reactions   Penicillins Hives, Shortness Of Breath and Swelling    Has patient had a PCN reaction causing immediate rash, facial/tongue/throat swelling, SOB or lightheadedness with hypotension: Yes Has patient had a PCN reaction causing severe rash involving mucus membranes or skin necrosis: No Has patient had a PCN reaction that required hospitalization No Has patient had a PCN reaction occurring within the last 10 years: No If all of the above answers are NO, then may proceed with Cephalosporin use.    Ingrezza  [Valbenazine  Tosylate]     Pt reports repetitive body jerking, feeling depress and off balance.    Pravastatin  Other (See Comments)    Cause muscle aches and cramps   Rosuvastatin  Other (See Comments)    Caused pain in hands, shoulders, back, and indigestion    Metabolic Disorder Labs: Lab Results  Component Value Date   HGBA1C 6.1 07/30/2023   MPG 103 03/29/2020   No results found for: PROLACTIN Lab Results  Component Value Date   CHOL 168 07/30/2023   TRIG 171.0 (H) 07/30/2023   HDL 53.60 07/30/2023   CHOLHDL 3 07/30/2023   VLDL 34.2 07/30/2023   LDLCALC 80 07/30/2023   LDLCALC 70 04/23/2022   Lab Results  Component Value Date   TSH 1.06 07/30/2023    Therapeutic Level Labs: Lab Results  Component Value Date   LITHIUM  0.8 04/11/2021   No results found for: CBMZ No results found for: VALPROATE  Current Medications: Current Outpatient Medications  Medication Sig Dispense Refill   albuterol  (PROAIR  HFA) 108 (90 Base) MCG/ACT inhaler Inhale 2 puffs into the lungs every 6 (six) hours as needed for  wheezing. 1 each 1   aspirin 81 MG chewable tablet Chew 81 mg by mouth daily.     atorvastatin  (LIPITOR) 10 MG tablet Take 1 tablet (10 mg total) by mouth daily. 90 tablet 3   Cholecalciferol (VITAMIN D ) 2000 units tablet Take 2,000 Units by mouth daily. (Patient taking differently: Take 2,000 Units by mouth 2 (two) times daily.)     clonazePAM  (KLONOPIN ) 0.5 MG tablet Take 0.5-1 tablets (0.25-0.5 mg total) by mouth 2 (two) times daily as needed for anxiety. 60 tablet 0   estradiol  (ESTRACE) 0.1 MG/GM vaginal cream Place vaginally.     Evolocumab  (REPATHA  SURECLICK) 140 MG/ML SOAJ Inject 140 mg into the skin every 14 (fourteen) days. 6 mL 3   ezetimibe  (ZETIA ) 10 MG tablet Take 1 tablet (10 mg total) by mouth daily. 90 tablet 3   fenofibrate  160 MG tablet Take 1 tablet (160 mg total) by mouth at bedtime. 90 tablet 3   levothyroxine  (SYNTHROID ) 75 MCG tablet TAKE 1 TABLET BY MOUTH EVERY DAY BEFORE BREAKFAST 90 tablet 2   loperamide (IMODIUM A-D) 2 MG tablet Take 2 mg by mouth as needed.     Magnesium Glycinate 120 MG CAPS  (Patient taking differently: 310 mg.)     Multiple Vitamins-Minerals (MULTIVITAMIN WITH MINERALS) tablet Take 1 tablet by mouth daily.     OVER THE COUNTER MEDICATION Calcium  supplement 600mg  with vitamin D  500 units- BID     pantoprazole  (PROTONIX ) 40 MG tablet TAKE 1 TABLET BY MOUTH 2 TIMES DAILY BEFORE A meal 60 tablet 1   telmisartan  (MICARDIS ) 40 MG tablet Take 1 tablet (40 mg total) by mouth daily. 90 tablet 3   valACYclovir  (VALTREX ) 500 MG tablet TAKE 1 TABLET DAILY FOR SUPPRESSION AND 1 TABLET  TWO TIMES A DAY FOR 3 DAYS FOR OUTBREAK Strength: 500 mg 90 tablet 3   escitalopram  (LEXAPRO ) 20 MG tablet Take 0.5 tablets (10 mg total) by mouth daily.     mirtazapine  (REMERON ) 15 MG tablet Take 1 tablet (15 mg total) by mouth at bedtime. 30 tablet 1   Current Facility-Administered Medications  Medication Dose Route Frequency Provider Last Rate Last Admin   0.9 %  sodium  chloride infusion  500 mL Intravenous Once Armbruster, Elspeth SQUIBB, MD        Musculoskeletal: Strength & Muscle Tone: within normal limits Gait & Station: normal Patient leans: N/A  Psychiatric Specialty Exam: Review of Systems  Psychiatric/Behavioral:  Positive for dysphoric mood and sleep disturbance. The patient is nervous/anxious.     Blood pressure 113/70, pulse 79, temperature (!) 96.4 F (35.8 C), temperature source Temporal, height 5' 1 (1.549 m), weight 131 lb (59.4 kg), last menstrual period 12/09/2013.Body mass index is 24.75 kg/m.  General Appearance: Fairly Groomed  Eye Contact:  Fair  Speech:  Clear and Coherent  Volume:  Normal  Mood:  Anxious  Affect:  Congruent  Thought Process:  Goal Directed and Descriptions of Associations: Intact  Orientation:  Full (Time, Place, and Person)  Thought Content:  Logical  Suicidal Thoughts:  No  Homicidal Thoughts:  No  Memory:  Immediate;   Fair Recent;   Fair Remote;   Fair  Judgement:  Fair  Insight:  Fair  Psychomotor Activity:  Restlessness and abnormal involuntary movements of her face  Concentration:  Concentration: Fair and Attention Span: Fair  Recall:  [employer] of Knowledge:Fair  Language: Fair  Akathisia:  Yes, complains of internal restlessness likely acathisia  Handed:  Right  AIMS (if indicated): Does have abnormal movements of her face as well as complaints of internal restlessness  Assets:  Communication Skills Desire for Improvement Housing Social Support Transportation  ADL's:  Intact  Cognition: WNL  Sleep:  Varies   Screenings: AIMS    Flowsheet Row Video Visit from 10/31/2023 in [facility] Office Visit from 09/13/2023 in [facility] Office Visit from 07/30/2023 in [facility] Office Visit from 06/21/2023 in [facility] Office Visit from 05/21/2023 in [facility]  AIMS Total Score 7 4 7 18 17    GAD-7    Flowsheet Row Office Visit from 07/09/2023 in [facility] [place]  Total GAD-7 Score 4   PHQ2-9    Flowsheet Row Office Visit from 07/09/2023 in [facility] [place] Office Visit from 04/23/2022 in [facility] [place] Office Visit from 11/05/2017 in [facility] [place]  PHQ-2 Total Score 0 0 1  PHQ-9 Total Score 3 2 --   Flowsheet Row ED from 11/08/2023 in [facility] at [facility] Admission (Discharged) from 03/16/2022 in [facility]  C-SSRS RISK CATEGORY No Risk No Risk    Assessment and plan:Desirre D Lee is a 63 year old Caucasian female with interna anxiety, abnormal involuntary movements of her face as well as mood lability, presents to establish care.  Discussed assessment and plan as noted below.  Mood disorder unspecified-rule out bipolar disorder-unstable Generalized anxiety disorder-unstable Insomnia-improving Nathanel reports a history of anger, irritability, sleep problems as well as anxiety although denies significant manic or hypomanic symptoms.  Latuda  helped although caused side effects.  Previously also tried medications like lithium   which may have helped however had to be taken off of it due to side effect concerns.  Currently on Lexapro  higher dosage although continues to struggle with internal restlessness and anxiety, unknown if secondary to medications.  Agreeable to dosage reduction.  Will consider a mood stabilizer in the future as needed. Reduce Lexapro  to 20 mg daily for 2 to 3 days and reduce to Lexapro  10 mg daily after that. Increase mirtazapine  to 15 mg at bedtime. Will consider addition of a mood stabilizer in the future. Continue clonazepam  0.25 0.15 mg twice a day as needed.  Advised to limit use. Discussed long-term tapering off of benzodiazepine, discussed  habit-forming potential. Reviewed [place] PMP AWARxE   Tardive dyskinesia-unstable Currently with abnormal involuntary movements of the face as well as complaints of internal restlessness likely akathisia.  Developed side effects to Ingrezza , declined trial of Austedo review of medical records from previous psychiatrist. Discussed referral to neurology for another opinion. She declines at this time. Continue clonazepam  as prescribed. Medication readjustments as noted above.  High risk medication use-will order urine drug screen.  Patient to go to [facility] lab.  Discussed establishing care with the therapist, provided resources in the community.  Will benefit from psychotherapy sessions.  Reviewed notes per previous psychiatrist at [facility] Ms. Verneita Cooks PA dated 08/29/2018 - 01/01/2024.  Patient to sign an ROI to request records prior to that from [facility].  Reviewed most recent labs including CBC with differential dated 11/08/2023-within normal limits, BMP-BUN elevated at 25, glucose 157 otherwise within normal limits, TSH-07/30/2019 24-1.06-within normal limits.  Collaboration of Care: Referral or follow-up with counselor/therapist AEB patient advised to establish care with therapist, discussed community resources.  Reviewed records as noted above.  Patient/Guardian was advised Release of Information must be obtained prior to any record release in order to collaborate their care with an outside provider. Patient/Guardian was advised if they have not already done so to contact the registration department to sign all necessary forms in order for us  to release information regarding their care.   Consent: Patient/Guardian gives verbal consent for treatment and assignment of benefits for services provided during this visit. Patient/Guardian expressed understanding and agreed to proceed.  I have spent atleast 60 minutes face to face with patient today which includes the time spent for preparing to see  the patient ( e.g., review of test, records ), obtaining and to review and separately obtained history , ordering medications and test ,psychoeducation and supportive psychotherapy and care coordination,as well as documenting clinical information in electronic health record.  Kevona Lupinacci, MD 6/30/20255:06 PM

## 2024-01-28 LAB — URINE DRUGS OF ABUSE SCREEN W ALC, ROUTINE (REF LAB)
Amphetamines, Urine: NEGATIVE ng/mL
Barbiturate, Ur: NEGATIVE ng/mL
Benzodiazepine Quant, Ur: NEGATIVE ng/mL
Cannabinoid Quant, Ur: NEGATIVE ng/mL
Cocaine (Metab.): NEGATIVE ng/mL
Creatinine, Urine: 58.4 mg/dL (ref 20.0–300.0)
Ethanol U, Quan: NEGATIVE %
Methadone Screen, Urine: NEGATIVE ng/mL
Nitrite Urine, Quantitative: NEGATIVE ug/mL
OPIATE SCREEN URINE: NEGATIVE ng/mL
Phencyclidine, Ur: NEGATIVE ng/mL
Propoxyphene, Urine: NEGATIVE ng/mL
pH, Urine: 6.6 (ref 4.5–8.9)

## 2024-01-30 ENCOUNTER — Ambulatory Visit: Payer: Self-pay | Admitting: Psychiatry

## 2024-01-30 DIAGNOSIS — G4700 Insomnia, unspecified: Secondary | ICD-10-CM

## 2024-01-30 DIAGNOSIS — F411 Generalized anxiety disorder: Secondary | ICD-10-CM

## 2024-01-30 MED ORDER — CLONAZEPAM 0.5 MG PO TABS: 0.2500 mg | ORAL_TABLET | Freq: Two times a day (BID) | ORAL | 0 refills | Status: DC | PRN

## 2024-01-30 NOTE — Telephone Encounter (Signed)
 I have reviewed urine drug screen.  I have sent clonazepam  refill to pharmacy.

## 2024-02-03 ENCOUNTER — Other Ambulatory Visit: Payer: Self-pay | Admitting: Internal Medicine

## 2024-02-03 NOTE — Telephone Encounter (Unsigned)
 Copied from CRM [id number]. Topic: Clinical - Medication Refill >> Feb 03, 2024  8:57 AM Jalayah J wrote: Medication: levothyroxine  (SYNTHROID ) 75 MCG tablet  Has the patient contacted their pharmacy? Yes (Agent: If no, request that the patient contact the pharmacy for the refill. If patient does not wish to contact the pharmacy document the reason why and proceed with request.) (Agent: If yes, when and what did the pharmacy advise?)  This is the patient's preferred pharmacy:  [facility]  [address], [place], [place] [address] Address: [address] [place], [place] [address]   Phone: [phone]  Is this the correct pharmacy for this prescription? Yes If no, delete pharmacy and type the correct one.   Has the prescription been filled recently? Yes  Is the patient out of the medication? Yes  Has the patient been seen for an appointment in the last year OR does the patient have an upcoming appointment? Yes  Can we respond through MyChart? Yes  Agent: Please be advised that Rx refills may take up to 3 business days. We ask that you follow-up with your pharmacy.

## 2024-02-04 ENCOUNTER — Other Ambulatory Visit: Payer: Self-pay

## 2024-02-04 ENCOUNTER — Ambulatory Visit: Admitting: Gastroenterology

## 2024-02-04 MED ORDER — LEVOTHYROXINE SODIUM 75 MCG PO TABS: ORAL_TABLET | ORAL | 1 refills | Status: DC

## 2024-02-18 LAB — NMR, LIPOPROFILE
Cholesterol, Total: 133 mg/dL (ref 100–199)
HDL Particle Number: 59.8 umol/L (ref >=30.5)
HDL-C: 60 mg/dL (ref >39)
LDL Particle Number: 847 nmol/L (ref <1000)
LDL Size: 19.7 nm — ABNORMAL LOW (ref >20.5)
LDL-C (NIH Calc): 50 mg/dL (ref 0–99)
LP-IR Score: 74 — ABNORMAL HIGH (ref <=45)
Small LDL Particle Number: 588 nmol/L — ABNORMAL HIGH (ref <=527)
Triglycerides: 136 mg/dL (ref 0–149)

## 2024-02-18 LAB — APOLIPOPROTEIN B: Apolipoprotein B: 66 mg/dL (ref <90)

## 2024-02-19 ENCOUNTER — Ambulatory Visit: Payer: Self-pay | Admitting: Pharmacist

## 2024-02-19 NOTE — Telephone Encounter (Signed)
 Lab discussed over the phone. TG and LDL at goal. Advised to continue with current therapies - statin, Zetia , Repatha  and fenofibrate . F/u lab due annually

## 2024-02-26 ENCOUNTER — Ambulatory Visit: Admitting: Physician Assistant

## 2024-02-26 ENCOUNTER — Other Ambulatory Visit (INDEPENDENT_AMBULATORY_CARE_PROVIDER_SITE_OTHER)

## 2024-02-26 ENCOUNTER — Encounter: Payer: Self-pay | Admitting: Physician Assistant

## 2024-02-26 ENCOUNTER — Ambulatory Visit: Payer: Self-pay | Admitting: Physician Assistant

## 2024-02-26 VITALS — BP 104/68 | HR 75 | Ht 61.0 in | Wt 134.0 lb

## 2024-02-26 DIAGNOSIS — R112 Nausea with vomiting, unspecified: Secondary | ICD-10-CM

## 2024-02-26 DIAGNOSIS — K760 Fatty (change of) liver, not elsewhere classified: Secondary | ICD-10-CM | POA: Diagnosis not present

## 2024-02-26 DIAGNOSIS — Z800 Family history of malignant neoplasm of digestive organs: Secondary | ICD-10-CM | POA: Diagnosis not present

## 2024-02-26 DIAGNOSIS — Z860101 Personal history of adenomatous and serrated colon polyps: Secondary | ICD-10-CM | POA: Diagnosis not present

## 2024-02-26 DIAGNOSIS — K2100 Gastro-esophageal reflux disease with esophagitis, without bleeding: Secondary | ICD-10-CM

## 2024-02-26 LAB — HEPATIC FUNCTION PANEL
ALT: 36 U/L — ABNORMAL HIGH (ref 0–35)
AST: 24 U/L (ref 0–37)
Albumin: 4.7 g/dL (ref 3.5–5.2)
Alkaline Phosphatase: 51 U/L (ref 39–117)
Bilirubin, Direct: 0.1 mg/dL (ref 0.0–0.3)
Total Bilirubin: 0.3 mg/dL (ref 0.2–1.2)
Total Protein: 7.1 g/dL (ref 6.0–8.3)

## 2024-02-26 MED ORDER — FAMOTIDINE 40 MG PO TABS: 40.0000 mg | ORAL_TABLET | Freq: Every day | ORAL | 3 refills | Status: AC

## 2024-02-26 MED ORDER — ESOMEPRAZOLE MAGNESIUM 40 MG PO CPDR: 40.0000 mg | DELAYED_RELEASE_CAPSULE | Freq: Every day | ORAL | 3 refills | Status: AC

## 2024-02-26 NOTE — Progress Notes (Signed)
 Ellouise Console, PA-C [address] [place], [place]  [address] Phone: [phone]   Primary Care Physician: Charlett Apolinar POUR, MD  Primary Gastroenterologist:  Ellouise Console, PA-C / Elspeth Naval, MD   Chief Complaint: Follow-up GERD, med refill       HPI:   Lisa Lee is a 63 y.o. female returns for annual follow-up of acid reflux.  She has history of chronic acid reflux for many years.  She weaned off Protonix  last year due to concerns about long-term PPI use.  She had recurrent severe acid reflux off PPI.  She had EGD 12/2022 which showed reflux esophagitis and was restarted on Protonix .  She typically takes Protonix  once daily every morning.  Occasionally takes a second dose later in the day for breakthrough GERD symptoms.  Also takes Tums as needed.  Protonix  does not seem to be working well.  She would like to try different treatment.  She avoids GERD trigger foods and drinks.  Denies dysphagia, abdominal pain, or weight loss.  She had an episode of nausea, vomiting, upper abdominal pain January 2025.  Started after she was started on Fosamax  and after atorvastatin  was increased to 40 Mg daily.  She was told she may have fatty liver and she was concerned about this.  Currently nausea and vomiting have resolved.  She is feeling better overall.  She is no longer taking Fosamax  and her atorvastatin  dose was decreased.  06/2023 colonoscopy: 2 small (3 mm, 5 mm) tubular adenoma polyps removed.  Pandiverticulosis.  5-year repeat (due 06/2028).  12/2022 EGD: LA grade a reflux esophagitis.  Benign esophageal stenosis.  Mild gastritis.  Biopsies negative for H. pylori.  No Barrett's.  Continue Protonix  40 Mg twice daily.  Family history of colon cancer: Father age 56.  Current Outpatient Medications  Medication Sig Dispense Refill   albuterol  (PROAIR  HFA) 108 (90 Base) MCG/ACT inhaler Inhale 2 puffs into the lungs every 6 (six) hours as needed for wheezing. 1 each 1    aspirin 81 MG chewable tablet Chew 81 mg by mouth daily.     atorvastatin  (LIPITOR) 10 MG tablet Take 1 tablet (10 mg total) by mouth daily. 90 tablet 3   Cholecalciferol (VITAMIN D ) 2000 units tablet Take 2,000 Units by mouth daily. (Patient taking differently: Take 2,000 Units by mouth 2 (two) times daily.)     clonazePAM  (KLONOPIN ) 0.5 MG tablet Take 0.5-1 tablets (0.25-0.5 mg total) by mouth 2 (two) times daily as needed for anxiety. Please limit use 60 tablet 0   escitalopram  (LEXAPRO ) 20 MG tablet Take 0.5 tablets (10 mg total) by mouth daily.     esomeprazole  (NEXIUM ) 40 MG capsule Take 1 capsule (40 mg total) by mouth daily before breakfast. 90 capsule 3   estradiol  (ESTRACE) 0.1 MG/GM vaginal cream Place vaginally.     Evolocumab  (REPATHA  SURECLICK) 140 MG/ML SOAJ Inject 140 mg into the skin every 14 (fourteen) days. 6 mL 3   ezetimibe  (ZETIA ) 10 MG tablet Take 1 tablet (10 mg total) by mouth daily. 90 tablet 3   famotidine  (PEPCID ) 40 MG tablet Take 1 tablet (40 mg total) by mouth at bedtime. 90 tablet 3   fenofibrate  160 MG tablet Take 1 tablet (160 mg total) by mouth at bedtime. 90 tablet 3   levothyroxine  (SYNTHROID ) 75 MCG tablet TAKE 1 TABLET BY MOUTH EVERY DAY BEFORE BREAKFAST 90 tablet 1   loperamide (IMODIUM A-D) 2 MG tablet Take 2 mg by  mouth as needed.     mirtazapine  (REMERON ) 15 MG tablet Take 1 tablet (15 mg total) by mouth at bedtime. 30 tablet 1   Multiple Vitamins-Minerals (MULTIVITAMIN WITH MINERALS) tablet Take 1 tablet by mouth daily.     OVER THE COUNTER MEDICATION Calcium  supplement 600mg  with vitamin D  500 units- BID     pregabalin (LYRICA) 25 MG capsule Take 25 mg by mouth 2 (two) times daily.     telmisartan  (MICARDIS ) 40 MG tablet Take 1 tablet (40 mg total) by mouth daily. 90 tablet 3   valACYclovir  (VALTREX ) 500 MG tablet TAKE 1 TABLET DAILY FOR SUPPRESSION AND 1 TABLET TWO TIMES A DAY FOR 3 DAYS FOR OUTBREAK Strength: 500 mg 90 tablet 3   Magnesium  Glycinate  120 MG CAPS  (Patient not taking: Reported on 02/26/2024)     Current Facility-Administered Medications  Medication Dose Route Frequency Provider Last Rate Last Admin   0.9 %  sodium chloride  infusion  500 mL Intravenous Once Armbruster, Elspeth SQUIBB, MD        Allergies as of 02/26/2024 - Review Complete 02/26/2024  Allergen Reaction Noted   Penicillins Hives, Shortness Of Breath, and Swelling 10/30/2019   Ingrezza  [valbenazine  tosylate]  07/09/2023   Pravastatin  Other (See Comments) 08/31/2016   Rosuvastatin  Other (See Comments) 01/09/2016    Past Medical History:  Diagnosis Date   Acid reflux    Asthma    Bipolar disorder (HCC)    Depression    Diarrhea 08/09/2011   Poss ibs  Vs other  Related to stress  consdier other eval if needed. Had colonoscoopy per dr Donnald    High cholesterol    History of hypertension    no meds for last year  per pt on 03-08-2022   HPV in female 1984   Hypothyroidism    PMB (postmenopausal bleeding)    Thyroid  disease     Past Surgical History:  Procedure Laterality Date   CERVICAL CONIZATION W/BX N/A 07/21/2015   Procedure: CONIZATION CERVIX WITH BIOPSY;  Surgeon: Rome Rigg, MD;  Location: [facility];  Service: Gynecology;  Laterality: N/A;   CERVICAL CONIZATION W/BX N/A 09/25/2018   Procedure: CONIZATION CERVIX WITH BIOPSY;  Surgeon: Eloy Herring, MD;  Location: [facility];  Service: Gynecology;  Laterality: N/A;   CERVICAL CRYOTHERAPY  07/30/1982   colonscopy  07/04/2018   COLPOSCOPY  08/26/2013   DILATATION & CURETTAGE/HYSTEROSCOPY WITH MYOSURE N/A 03/16/2022   Procedure: ATTEMPTED DILATATION & CURETTAGE/ATTEMPTED HYSTEROSCOPY WITH MYOSURE, POLYPECTOMY;  Surgeon: Estelle Service, MD;  Location: [facility];  Service: Gynecology;  Laterality: N/A;   DILATION AND CURETTAGE, DIAGNOSTIC / THERAPEUTIC  07/30/1992   Blighted Ovum   HYSTEROSCOPY     uterine polypectomy Jan 7th   HYSTEROSCOPY WITH RESECTOSCOPE   08/04/2009   Removed polyp & IUD   LYMPH NODE BIOPSY     40 yrs ago   OPERATIVE ULTRASOUND N/A 03/16/2022   Procedure: OPERATIVE ULTRASOUND;  Surgeon: Estelle Service, MD;  Location: [facility];  Service: Gynecology;  Laterality: N/A;   TONSILLECTOMY     as child   UPPER GASTROINTESTINAL ENDOSCOPY  01/11/2023   UPPER GI ENDOSCOPY  07/04/2018    Review of Systems:    All systems reviewed and negative except where noted in HPI.    Physical Exam:  BP 104/68   Pulse 75   Ht 5' 1 (1.549 m)   Wt 134 lb (60.8 kg)   LMP 12/09/2013  BMI 25.32 kg/m  Patient's last menstrual period was 12/09/2013.  General: Well-nourished, well-developed in no acute distress.  Lungs: Clear to auscultation bilaterally. Non-labored. Heart: Regular rate and rhythm, no murmurs rubs or gallops.  Abdomen: Bowel sounds are normal; Abdomen is Soft; No hepatosplenomegaly, masses or hernias;  No Abdominal Tenderness; No guarding or rebound tenderness. Neuro: Alert and oriented x 3.  Grossly intact.  Psych: Alert and cooperative, normal mood and affect.   Imaging Studies: No results found.  Labs: CBC    Component Value Date/Time   WBC 10.5 11/08/2023 1457   RBC 4.25 11/08/2023 1457   HGB 13.4 11/08/2023 1457   HGB 13.2 06/03/2019 0840   HCT 38.9 11/08/2023 1457   HCT 37.8 06/03/2019 0840   PLT 330 11/08/2023 1457   PLT 368 06/03/2019 0840   MCV 91.5 11/08/2023 1457   MCV 91 06/03/2019 0840   MCH 31.5 11/08/2023 1457   MCHC 34.4 11/08/2023 1457   RDW 11.8 11/08/2023 1457   RDW 11.8 06/03/2019 0840   LYMPHSABS 2.4 07/30/2023 1148   MONOABS 0.7 07/30/2023 1148   EOSABS 0.3 07/30/2023 1148   BASOSABS 0.1 07/30/2023 1148    CMP     Component Value Date/Time   NA 140 11/20/2023 0750   K 4.7 11/20/2023 0750   CL 103 11/20/2023 0750   CO2 21 11/20/2023 0750   GLUCOSE 163 (H) 11/20/2023 0750   GLUCOSE 157 (H) 11/08/2023 1457   BUN 25 11/20/2023 0750   CREATININE 1.02 (H)  11/20/2023 0750   CREATININE 1.23 (H) 01/30/2023 1545   CALCIUM  11.1 (H) 11/20/2023 0750   PROT 7.0 09/06/2023 0808   PROT 6.9 06/03/2019 0840   ALBUMIN 4.5 09/06/2023 0808   ALBUMIN 4.9 06/03/2019 0840   AST 24 09/06/2023 0808   ALT 30 09/06/2023 0808   ALKPHOS 39 09/06/2023 0808   BILITOT 0.5 09/06/2023 0808   BILITOT 0.3 06/03/2019 0840   GFRNONAA >60 11/08/2023 1457   GFRAA 68 06/03/2019 0840     Assessment and Plan:   Alithia D Lee is a 63 y.o. y/o female   1.  GERD with esophagitis; history of chronic acid reflux for many years.  Has required PPI to treat esophagitis. - Stop pantoprazole . - Start Nexium  40 Mg once daily every morning, #90, 3 refills. - Start famotidine  40 Mg once daily every afternoon, #90, 3 refills. - Recommend Lifestyle Modifications to prevent Acid Reflux.  Rec. Avoid coffee, sodas, peppermint, garlic, onions, alcohol, citrus fruits, chocolate, tomatoes, fatty and spicey foods.  Avoid eating 2-3 hours before bedtime.   - We discussed adverse side effects of PPIs.  Recommend take lowest effective dose of PPI necessary to control acid reflux.   2.  Episodes of nausea, vomiting in the past year.  Currently improved.  Possible adverse side effect of medication.  Cholelithiasis is also in the differential. - RUQ ultrasound: Evaluate for gallstones and fatty liver. - Lab: Hepatic panel. - Continue low-fat diet and regular exercise.  3.  History of adenomatous colon polyps 4.  Family history of colon cancer (father age 16) -5-year repeat colonoscopy will be due 06/2028.    Ellouise Console, PA-C  Follow up in 1 year or sooner if worsening GI symptoms.

## 2024-02-26 NOTE — Patient Instructions (Addendum)
 Your provider has requested that you go to the basement level for lab work before leaving today. Press B on the elevator. The lab is located at the first door on the left as you exit the elevator.  Stop Pantoprazole .  Start Nexium  (Esomeprazole ) 40mg  1 tablet once daily before breakfast.  Start Famotidine  40mg  1 tablet once daily before dinner or bedtime.  Recommend Lifestyle Modifications to prevent Acid Reflux.  Rec. Avoid coffee, sodas, peppermint, garlic, onions, alcohol, citrus fruits, chocolate, tomatoes, fatty and spicey foods.  Avoid eating 2-3 hours before bedtime.    You have been scheduled for an abdominal ultrasound at [facility] (1st floor of hospital) on 03/03/24 at 8:30 am. Please arrive 30 minutes prior to your appointment for registration. Make certain not to have anything to eat or drink 6 hours prior to your appointment. Should you need to reschedule your appointment, please contact radiology at [phone]. This test typically takes about 30 minutes to perform.  Please follow up sooner if symptoms increase or worsen  Due to recent changes in healthcare laws, you may see the results of your imaging and laboratory studies on MyChart before your provider has had a chance to review them.  We understand that in some cases there may be results that are confusing or concerning to you. Not all laboratory results come back in the same time frame and the provider may be waiting for multiple results in order to interpret others.  Please give us  48 hours in order for your provider to thoroughly review all the results before contacting the office for clarification of your results.   Thank you for trusting me with your gastrointestinal care!   Ellouise Console, PA-C _______________________________________________________  If your blood pressure at your visit was 140/90 or greater, please contact your primary care physician to follow up on  this.  _______________________________________________________  If you are age 10 or older, your body mass index should be between 23-30. Your Body mass index is 25.32 kg/m. If this is out of the aforementioned range listed, please consider follow up with your Primary Care Provider.  If you are age 33 or younger, your body mass index should be between 19-25. Your Body mass index is 25.32 kg/m. If this is out of the aformentioned range listed, please consider follow up with your Primary Care Provider.   ________________________________________________________  The [facility] providers would like to encourage you to use MYCHART to communicate with providers for non-urgent requests or questions.  Due to long hold times on the telephone, sending your provider a message by [facility] may be a faster and more efficient way to get a response.  Please allow 48 business hours for a response.  Please remember that this is for non-urgent requests.  _______________________________________________________

## 2024-02-27 NOTE — Progress Notes (Signed)
 Agree with assessment and plan as outlined. Another consideration for her given reliance on PPI and use of fosamax  for her bone health, would be TIF. I do think she would be a candidate for TIF if she wanted to come off PPI over time. Otherwise continue PPI daily to control symptoms, use lowest dose needed to control symptoms.

## 2024-03-03 ENCOUNTER — Ambulatory Visit (HOSPITAL_COMMUNITY)
Admission: RE | Admit: 2024-03-03 | Discharge: 2024-03-03 | Disposition: A | Discharge status: Home / Self Care | Source: Ambulatory Visit | Attending: Physician Assistant | Admitting: Physician Assistant

## 2024-03-03 DIAGNOSIS — K760 Fatty (change of) liver, not elsewhere classified: Secondary | ICD-10-CM | POA: Diagnosis present

## 2024-03-05 NOTE — Progress Notes (Signed)
 Orders only

## 2024-03-14 ENCOUNTER — Other Ambulatory Visit: Payer: Self-pay | Admitting: Psychiatry

## 2024-03-14 DIAGNOSIS — F411 Generalized anxiety disorder: Secondary | ICD-10-CM

## 2024-03-14 DIAGNOSIS — G4700 Insomnia, unspecified: Secondary | ICD-10-CM

## 2024-03-14 DIAGNOSIS — F0630 Mood disorder due to known physiological condition, unspecified: Secondary | ICD-10-CM

## 2024-04-07 ENCOUNTER — Encounter: Payer: Self-pay | Admitting: Psychiatry

## 2024-04-07 ENCOUNTER — Telehealth (INDEPENDENT_AMBULATORY_CARE_PROVIDER_SITE_OTHER): Admitting: Psychiatry

## 2024-04-07 DIAGNOSIS — R259 Unspecified abnormal involuntary movements: Secondary | ICD-10-CM | POA: Diagnosis not present

## 2024-04-07 DIAGNOSIS — F3342 Major depressive disorder, recurrent, in full remission: Secondary | ICD-10-CM

## 2024-04-07 DIAGNOSIS — G4700 Insomnia, unspecified: Secondary | ICD-10-CM

## 2024-04-07 DIAGNOSIS — F411 Generalized anxiety disorder: Secondary | ICD-10-CM

## 2024-04-07 MED ORDER — ESCITALOPRAM OXALATE 10 MG PO TABS: 10.0000 mg | ORAL_TABLET | Freq: Every day | ORAL | 1 refills | Status: DC

## 2024-04-07 MED ORDER — MIRTAZAPINE 15 MG PO TABS: 15.0000 mg | ORAL_TABLET | Freq: Every day | ORAL | 1 refills | Status: AC

## 2024-04-07 NOTE — Progress Notes (Signed)
 Virtual Visit via Video Note  I connected with Lisa Lee on 04/07/24 at  1:00 PM EDT by a video enabled telemedicine application and verified that I am speaking with the correct person using two identifiers.  Location Provider Location : [facility] Patient Location : Work  Participants: Patient , Provider    I discussed the limitations of evaluation and management by telemedicine and the availability of in person appointments. The patient expressed understanding and agreed to proceed.   I discussed the assessment and treatment plan with the patient. The patient was provided an opportunity to ask questions and all were answered. The patient agreed with the plan and demonstrated an understanding of the instructions.   The patient was advised to call back or seek an in-person evaluation if the symptoms worsen or if the condition fails to improve as anticipated.  BH MD  OP Progress Note  04/07/2024 1:13 PM LYNNELLE MESMER  MRN:  [id number]  Chief Complaint:  Chief Complaint  Patient presents with   Follow-up   Anxiety   Depression   Medication Refill   Discussed the use of AI scribe software for clinical note transcription with the patient, who gave verbal consent to proceed.  History of Present Illness Lisa Lee is a 63 year old Caucasian female, married, currently employed, lives in [place], has a history of multiple psychiatric diagnoses including bipolar disorder, generalized anxiety disorder, akathisia, tardive dyskinesia, hypertension, hyperlipidemia, gastroesophageal reflux disease, asthma, hypothyroidism was evaluated by telemedicine today.  She reports feeling pretty well overall and describes a significant reduction in work-related stress compared to the previous year. 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  Ongoing  psychosocial stressors at home include her husband's prolonged recovery from knee replacement surgery and the recent death of a relative due to complications from chemotherapy. 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 At one point, she considered leaving the house temporarily to stay with her daughter due to her husband's behavior, but she emphasizes that she has no intention of leaving her marriage and is trying to be patient. She identifies her home environment as boring and sometimes stressful, which she believes contributes to increased snacking and eating as a coping strategy.  She denies any current thoughts of hurting herself or others. Over the past 2 weeks, she denies significant sadness, hopelessness, or loss of interest in activities and describes herself as very productive.  With her current regimen of mirtazapine  15 mg at bedtime and a quarter tablet of clonazepam  (0.125 mg) at night, she reports very good sleep. Occasionally, she takes an extra half tablet of clonazepam  (0.25 mg) once or twice a week if she feels agitated or stressed. She states that she has not been able to discontinue clonazepam  completely, as she feels squirrely without it, but she is taking a very low dose. She reports increased appetite and weight gain, which she attributes to mirtazapine  and to eating for entertainment at home, but she feels the benefits of the medication outweigh the side effects. She notes that she is still lighter than she was a year ago and is aware of her eating habits.  She does not currently have a therapist and cites financial stressors and a busy schedule as barriers to seeking therapy. She identifies marriage counseling as a possible future step if  needed.  She does not currently belong to a gym and is not regularly exercising, but is considering returning to yoga or trying aquatic  therapy.    Visit Diagnosis:    ICD-10-CM   1. Recurrent major depressive disorder, in full remission (HCC)  F33.42 mirtazapine  (REMERON ) 15 MG tablet    escitalopram  (LEXAPRO ) 10 MG tablet   with mixed features    2. Generalized anxiety disorder  F41.1 mirtazapine  (REMERON ) 15 MG tablet    escitalopram  (LEXAPRO ) 10 MG tablet    clonazePAM  (KLONOPIN ) 0.5 MG tablet    3. Insomnia, unspecified type  G47.00 mirtazapine  (REMERON ) 15 MG tablet    escitalopram  (LEXAPRO ) 10 MG tablet    4. Abnormal involuntary movement  R25.9       Past Psychiatric History: I have reviewed past psychiatric history from progress note on 01/27/2024.  Past trials of medications like Lunesta , Lexapro , lithium , Latuda , modafinil , Xanax   Past Medical History:  Past Medical History:  Diagnosis Date   Acid reflux    Asthma    Bipolar disorder (HCC)    Depression    Diarrhea 08/09/2011   Poss ibs  Vs other  Related to stress  consdier other eval if needed. Had colonoscoopy per dr Donnald    High cholesterol    History of hypertension    no meds for last year  per pt on 03-08-2022   HPV in female 1984   Hypothyroidism    PMB (postmenopausal bleeding)    Thyroid  disease     Past Surgical History:  Procedure Laterality Date   CERVICAL CONIZATION W/BX N/A 07/21/2015   Procedure: CONIZATION CERVIX WITH BIOPSY;  Surgeon: Rome Rigg, MD;  Location: [facility];  Service: Gynecology;  Laterality: N/A;   CERVICAL CONIZATION W/BX N/A 09/25/2018   Procedure: CONIZATION CERVIX WITH BIOPSY;  Surgeon: Eloy Herring, MD;  [facility]  Service: Gynecology;  Laterality: N/A;   CERVICAL CRYOTHERAPY  07/30/1982   colonscopy  07/04/2018   COLPOSCOPY  08/26/2013   DILATATION & CURETTAGE/HYSTEROSCOPY WITH MYOSURE N/A 03/16/2022   Procedure: ATTEMPTED DILATATION & CURETTAGE/ATTEMPTED HYSTEROSCOPY WITH MYOSURE, POLYPECTOMY;  Surgeon: Estelle Service, MD;  Location: [facility];   Service: Gynecology;  Laterality: N/A;   DILATION AND CURETTAGE, DIAGNOSTIC / THERAPEUTIC  07/30/1992   Blighted Ovum   HYSTEROSCOPY     uterine polypectomy Jan 7th   HYSTEROSCOPY WITH RESECTOSCOPE  08/04/2009   Removed polyp & IUD   LYMPH NODE BIOPSY     40 yrs ago   OPERATIVE ULTRASOUND N/A 03/16/2022   Procedure: OPERATIVE ULTRASOUND;  Surgeon: Estelle Service, MD;  Location: [facility];  Service: Gynecology;  Laterality: N/A;   TONSILLECTOMY     as child   UPPER GASTROINTESTINAL ENDOSCOPY  01/11/2023   UPPER GI ENDOSCOPY  07/04/2018    Family Psychiatric History: I have reviewed family psychiatric history from progress note on 01/27/2024.  Family History:  Family History  Problem Relation Age of Onset   Depression Mother    Arthritis Mother    Heart disease Mother    Rectal cancer Father    Heart disease Father    Colon cancer Father    Parkinson's disease Father    Depression Sister    Heart disease Sister 9       cabg stent   Depression Sister    Heart disease Sister    Thyroid  disease Sister    Heart disease Brother    Suicidality Cousin  Drug abuse Daughter    Anxiety disorder Daughter    Depression Daughter    Pulmonary embolism Daughter    Anxiety disorder Daughter    Thyroid  disease Daughter    Esophageal cancer Neg Hx    Stomach cancer Neg Hx     Social History: Reviewed social history from progress note on 01/27/2024. Social History   Socioeconomic History   Marital status: Married    Spouse name: Not on file   Number of children: Not on file   Years of education: Not on file   Highest education level: Bachelor's degree (e.g., BA, AB, BS)  Occupational History   Not on file  Tobacco Use   Smoking status: Never   Smokeless tobacco: Never  Vaping Use   Vaping status: Never Used  Substance and Sexual Activity   Alcohol use: Yes    Alcohol/week: 0.0 standard drinks of alcohol    Comment: monthly ocassional drink   Drug  use: No   Sexual activity: Yes    Birth control/protection: None  Other Topics Concern   Not on file  Social History Narrative    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    Divorced   Regular exercise-  Not as much recently    [facility] of 2   Pets 2 cats 1 dog to move   Daughter  On recovery heroin   Social Drivers of Corporate Investment Banker Strain: Low Risk  (07/29/2023)   Overall Financial Resource Strain (CARDIA)    Difficulty of Paying Living Expenses: Not hard at all  Food Insecurity: No Food Insecurity (07/29/2023)   Hunger Vital Sign    Worried About Running Out of Food in the Last Year: Never true    Ran Out of Food in the Last Year: Never true  Transportation Needs: No Transportation Needs (07/29/2023)   PRAPARE - Administrator, Civil Service (Medical): No    Lack of Transportation (Non-Medical): No  Physical Activity: Insufficiently Active (07/29/2023)   Exercise Vital Sign    Days of Exercise per Week: 2 days    Minutes of Exercise per Session: 60 min  Stress: No Stress Concern Present (07/29/2023)   [employer] of Occupational Health - Occupational Stress Questionnaire    Feeling of Stress : Not at all  Social Connections: Moderately Isolated (07/29/2023)   Social Connection and Isolation Panel    Frequency of Communication with Friends and Family: More than three times a week    Frequency of Social Gatherings with Friends and Family: Once a week    Attends Religious Services: Never    Database Administrator or Organizations: No    Attends Engineer, Structural: Not on file    Marital Status: Married    Allergies:  Allergies  Allergen Reactions   Penicillins Hives, Shortness Of Breath and Swelling    Has patient had a PCN reaction causing immediate rash, facial/tongue/throat swelling, SOB or lightheadedness with hypotension: Yes Has patient had a PCN reaction causing severe rash  involving mucus membranes or skin necrosis: No Has patient had a PCN reaction that required hospitalization No Has patient had a PCN reaction occurring within the last 10 years: No If all of the above answers are NO, then may proceed with Cephalosporin use.    Ingrezza  [Valbenazine  Tosylate]     Pt reports repetitive body jerking, feeling depress and off balance.    Pravastatin  Other (  See Comments)    Cause muscle aches and cramps   Rosuvastatin  Other (See Comments)    Caused pain in hands, shoulders, back, and indigestion    Metabolic Disorder Labs: Lab Results  Component Value Date   HGBA1C 6.1 07/30/2023   MPG 103 03/29/2020   No results found for: PROLACTIN Lab Results  Component Value Date   CHOL 168 07/30/2023   TRIG 171.0 (H) 07/30/2023   HDL 53.60 07/30/2023   CHOLHDL 3 07/30/2023   VLDL 34.2 07/30/2023   LDLCALC 80 07/30/2023   LDLCALC 70 04/23/2022   Lab Results  Component Value Date   TSH 1.06 07/30/2023   TSH 2.33 01/30/2023    Therapeutic Level Labs: Lab Results  Component Value Date   LITHIUM  0.8 04/11/2021   LITHIUM  0.6 09/08/2020   No results found for: VALPROATE No results found for: CBMZ  Current Medications: Current Outpatient Medications  Medication Sig Dispense Refill   albuterol  (PROAIR  HFA) 108 (90 Base) MCG/ACT inhaler Inhale 2 puffs into the lungs every 6 (six) hours as needed for wheezing. 1 each 1   aspirin 81 MG chewable tablet Chew 81 mg by mouth daily.     atorvastatin  (LIPITOR) 10 MG tablet Take 1 tablet (10 mg total) by mouth daily. 90 tablet 3   Cholecalciferol (VITAMIN D ) 2000 units tablet Take 2,000 Units by mouth daily.     clonazePAM  (KLONOPIN ) 0.5 MG tablet Take 0.5-1 tablets (0.25-0.5 mg total) by mouth daily as needed for anxiety.     escitalopram  (LEXAPRO ) 10 MG tablet Take 1 tablet (10 mg total) by mouth daily with breakfast. 90 tablet 1   esomeprazole  (NEXIUM ) 40 MG capsule Take 1 capsule (40 mg total) by mouth  daily before breakfast. 90 capsule 3   Evolocumab  (REPATHA  SURECLICK) 140 MG/ML SOAJ Inject 140 mg into the skin every 14 (fourteen) days. 6 mL 3   ezetimibe  (ZETIA ) 10 MG tablet Take 1 tablet (10 mg total) by mouth daily. 90 tablet 3   famotidine  (PEPCID ) 40 MG tablet Take 1 tablet (40 mg total) by mouth at bedtime. 90 tablet 3   fenofibrate  160 MG tablet Take 1 tablet (160 mg total) by mouth at bedtime. 90 tablet 3   levothyroxine  (SYNTHROID ) 75 MCG tablet TAKE 1 TABLET BY MOUTH EVERY DAY BEFORE BREAKFAST 90 tablet 1   loperamide (IMODIUM A-D) 2 MG tablet Take 2 mg by mouth as needed.     Multiple Vitamins-Minerals (MULTIVITAMIN WITH MINERALS) tablet Take 1 tablet by mouth daily.     OVER THE COUNTER MEDICATION Calcium  supplement 600mg  with vitamin D  500 units- BID     telmisartan  (MICARDIS ) 40 MG tablet Take 1 tablet (40 mg total) by mouth daily. 90 tablet 3   valACYclovir  (VALTREX ) 500 MG tablet TAKE 1 TABLET DAILY FOR SUPPRESSION AND 1 TABLET TWO TIMES A DAY FOR 3 DAYS FOR OUTBREAK Strength: 500 mg 90 tablet 3   estradiol  (ESTRACE) 0.1 MG/GM vaginal cream Place vaginally. (Patient not taking: Reported on 04/07/2024)     mirtazapine  (REMERON ) 15 MG tablet Take 1 tablet (15 mg total) by mouth at bedtime. 90 tablet 1   Current Facility-Administered Medications  Medication Dose Route Frequency Provider Last Rate Last Admin   0.9 %  sodium chloride  infusion  500 mL Intravenous Once Armbruster, Elspeth SQUIBB, MD         Musculoskeletal: Strength & Muscle Tone: UTA Gait & Station: Seated Patient leans: N/A  Psychiatric Specialty Exam: Review of Systems  Psychiatric/Behavioral: Negative.      Last menstrual period 12/09/2013.There is no height or weight on file to calculate BMI.  General Appearance: Casual  Eye Contact:  Fair  Speech:  Clear and Coherent  Volume:  Normal  Mood:  Euthymic  Affect:  Appropriate  Thought Process:  Goal Directed and Descriptions of Associations: Intact   Orientation:  Full (Time, Place, and Person)  Thought Content: Logical   Suicidal Thoughts:  No  Homicidal Thoughts:  No  Memory:  Immediate;   Fair Recent;   Fair Remote;   Fair  Judgement:  Fair  Insight:  Fair  Psychomotor Activity:  Normal  Concentration:  Concentration: Fair and Attention Span: Fair  Recall:  [employer] of Knowledge: Fair  Language: Fair  Akathisia:  No  Handed:  Right  AIMS (if indicated): not done  Assets:  Communication Skills Desire for Improvement Housing Social Support Transportation Vocational/Educational  ADL's:  Intact  Cognition: WNL  Sleep:  Fair   Screenings: AIMS    Flowsheet Row Office Visit from 01/27/2024 in [facility]  AIMS Total Score 7   GAD-7    Flowsheet Row Office Visit from 01/27/2024 in [facility] Office Visit from 07/09/2023 in [facility] [place]  Total GAD-7 Score 4 4   PHQ2-9    Flowsheet Row Video Visit from 04/07/2024 in [facility] Office Visit from 01/27/2024 in [facility] Office Visit from 07/09/2023 in [facility] [place] Office Visit from 04/23/2022 in [facility] [place]  PHQ-2 Total Score 0 1 0 0  PHQ-9 Total Score -- -- 3 2   Flowsheet Row Video Visit from 04/07/2024 in [facility] Office Visit from 01/27/2024 in [facility] ED from 11/08/2023 in [facility] at [facility]  C-SSRS RISK CATEGORY Moderate Risk No Risk No Risk     Assessment and Plan: Laiyah Exline Lee is a 63 year old Caucasian female with depression, anxiety, abnormal involuntary movement of her face was evaluated by telemedicine today.  Discussed assessment and plan as noted below.  MDD with mixed  features in remission Generalized anxiety disorder-improving Insomnia-improving Currently reports overall improvement in her mood symptoms.  Denies any hypomanic or manic symptoms.  Denies any recent irritability anger issues.  Does report ongoing marital issues and is agreeable to establish care with family counselor.  Sleep has improved since being on the mirtazapine . Continue Mirtazapine  15 mg at bedtime Continue Lexapro  10 mg daily. Continue Clonazepam  0.125 mg - 0.25 mg as needed at bedtime. Reviewed [place] PMP AWARxE  Abnormal involuntary movements-improving Currently reports movements of her face as better. Will monitor closely.  Follow-up Follow-up in clinic in 3 months or sooner if needed.  Collaboration of Care: Collaboration of Care: Other encouraged to establish care with individual therapist/family counseling as needed.  Patient/Guardian was advised Release of Information must be obtained prior to any record release in order to collaborate their care with an outside provider. Patient/Guardian was advised if they have not already done so to contact the registration department to sign all necessary forms in order for us  to release information regarding their care.   Consent: Patient/Guardian gives verbal consent for treatment and assignment of benefits for services provided during this visit. Patient/Guardian expressed understanding and agreed to proceed.  This note was generated in  part or whole with voice recognition software. Voice recognition is usually quite accurate but there are transcription errors that can and very often do occur. I apologize for any typographical errors that were not detected and corrected.     Makaio Mach, MD 04/08/2024, 5:14 PM

## 2024-04-08 MED ORDER — CLONAZEPAM 0.5 MG PO TABS: 0.2500 mg | ORAL_TABLET | Freq: Every day | ORAL | Status: AC | PRN

## 2024-05-27 ENCOUNTER — Ambulatory Visit: Payer: Self-pay

## 2024-05-27 NOTE — Telephone Encounter (Signed)
 FYI Only or Action Required?: FYI only for provider: appointment scheduled on 05/29/24.  Patient was last seen in primary care on 09/11/2023 by Panosh, Apolinar POUR, MD.  Called Nurse Triage reporting [place].  Symptoms began several weeks ago.  Interventions attempted: Nothing.  Symptoms are: gradually worsening.  Triage Disposition: See PCP When Office is Open (Within 3 Days)  Patient/caregiver understands and will follow disposition?: Yes    Copied from CRM [id number]. Topic: Clinical - Red Word Triage >> May 27, 2024  1:44 PM Gustabo D wrote: Pt has swelling/ lump under left armpit says it's soft- for about 3 weeks she noticed it since it got bigger. Reason for Disposition  [1] Small swelling or lump AND [2] unexplained AND [3] present > 1 week  Answer Assessment - Initial Assessment Questions Pt states she noticed this lump about 3 weeks ago in her armpit. It is soft and malleable. She states there is like a stalk growing downward from it. No redness, not painful. Only thing new is a gel version of the same deodarant she was using.     1. APPEARANCE of SWELLING: What does it look like?     Like a lump with a stalk growning down  2. SIZE: How large is the swelling? (e.g., inches, cm; or compare to size of pinhead, tip of pen, eraser, coin, pea, grape, ping pong ball)       3. LOCATION: Where is the swelling located?     In left armpit 4. ONSET: When did the swelling start?     About 3 weeks ago 5. COLOR: What color is it? Is there more than one color?     No change in color 6. PAIN: Is there any pain? If Yes, ask: How bad is the pain? (Scale 1-10; or mild, moderate, severe)       No pain 7. ITCH: Does it itch? If Yes, ask: How bad is the itch?      no 8. CAUSE: What do you think caused the swelling?     unknown 9 OTHER SYMPTOMS: Do you have any other symptoms? (e.g., fever)     No  Protocols used: Skin Lump or Localized Swelling-A-AH

## 2024-05-29 ENCOUNTER — Ambulatory Visit: Admitting: Family Medicine

## 2024-05-29 ENCOUNTER — Encounter: Payer: Self-pay | Admitting: Family Medicine

## 2024-05-29 VITALS — BP 120/80 | HR 77 | Temp 98.2°F | Ht 61.0 in | Wt 137.0 lb

## 2024-05-29 DIAGNOSIS — D1722 Benign lipomatous neoplasm of skin and subcutaneous tissue of left arm: Secondary | ICD-10-CM | POA: Diagnosis not present

## 2024-05-29 NOTE — Patient Instructions (Signed)
 Suspect lipomatous mass left axilla.  Watch for any rapid growth, pain, or other new finding.  Keep regular follow up with GYN  We could get diagnostic mammogram with ultrasound if any continued growth or other changes.

## 2024-05-29 NOTE — Progress Notes (Signed)
 Established Patient Office Visit  Subjective   Patient ID: Lisa Lee, female    DOB: 05-24-1961  Age: 63 y.o. MRN: [id number]  Chief Complaint  Patient presents with   Mass    In L arm pit     HPI   Donny is seen with soft tissue prominence left axillary region which she states she has noticed for past 3 to 4 weeks.  She had her last mammogram reportedly in January which was normal.  She sees GYN regularly.  She did not note any breast masses.  No nipple discharge.  No associated pain.  No overlying skin changes.  No weight changes other than some weight gain which she attributes to mirtazapine   She also mentions that for the past year really she has had some intermittent numbness right upper extremity and occasionally right face.  These symptoms are very transient.  No motor weakness.  She has no history of diabetes.  She recently started herself on over-the-counter B12 couple weeks ago.  She apparently has establishing with new primary in several weeks and plans to address with them.  Past Medical History:  Diagnosis Date   Acid reflux    Asthma    Bipolar disorder (HCC)    Depression    Diarrhea 08/09/2011   Poss ibs  Vs other  Related to stress  consdier other eval if needed. Had colonoscoopy per dr Donnald    High cholesterol    History of hypertension    no meds for last year  per pt on 03-08-2022   HPV in female 1984   Hypothyroidism    PMB (postmenopausal bleeding)    Thyroid  disease    Past Surgical History:  Procedure Laterality Date   CERVICAL CONIZATION W/BX N/A 07/21/2015   Procedure: CONIZATION CERVIX WITH BIOPSY;  Surgeon: Rome Rigg, MD;  Location: [facility];  Service: Gynecology;  Laterality: N/A;   CERVICAL CONIZATION W/BX N/A 09/25/2018   Procedure: CONIZATION CERVIX WITH BIOPSY;  Surgeon: Eloy Herring, MD;  Location[facility];  Service: Gynecology;  Laterality: N/A;   CERVICAL CRYOTHERAPY  07/30/1982   colonscopy   07/04/2018   COLPOSCOPY  08/26/2013   DILATATION & CURETTAGE/HYSTEROSCOPY WITH MYOSURE N/A 03/16/2022   Procedure: ATTEMPTED DILATATION & CURETTAGE/ATTEMPTED HYSTEROSCOPY WITH MYOSURE, POLYPECTOMY;  Surgeon: Estelle Service, MD;  Location: [facility];  Service: Gynecology;  Laterality: N/A;   DILATION AND CURETTAGE, DIAGNOSTIC / THERAPEUTIC  07/30/1992   Blighted Ovum   HYSTEROSCOPY     uterine polypectomy Jan 7th   HYSTEROSCOPY WITH RESECTOSCOPE  08/04/2009   Removed polyp & IUD   LYMPH NODE BIOPSY     40 yrs ago   OPERATIVE ULTRASOUND N/A 03/16/2022   Procedure: OPERATIVE ULTRASOUND;  Surgeon: Estelle Service, MD;  Location: [facility] ;  Service: Gynecology;  Laterality: N/A;   TONSILLECTOMY     as child   UPPER GASTROINTESTINAL ENDOSCOPY  01/11/2023   UPPER GI ENDOSCOPY  07/04/2018    reports that she has never smoked. She has never used smokeless tobacco. She reports current alcohol use. She reports that she does not use drugs. family history includes Anxiety disorder in her daughter and daughter; Arthritis in her mother; Colon cancer in her father; Depression in her daughter, mother, sister, and sister; Drug abuse in her daughter; Heart disease in her brother, father, mother, and sister; Heart disease (age of onset: 5) in her sister; Parkinson's disease in her father; Pulmonary embolism in her  daughter; Rectal cancer in her father; Suicidality in her cousin; Thyroid  disease in her daughter and sister. Allergies  Allergen Reactions   Penicillins Hives, Shortness Of Breath and Swelling    Has patient had a PCN reaction causing immediate rash, facial/tongue/throat swelling, SOB or lightheadedness with hypotension: Yes Has patient had a PCN reaction causing severe rash involving mucus membranes or skin necrosis: No Has patient had a PCN reaction that required hospitalization No Has patient had a PCN reaction occurring within the last 10 years: No If  all of the above answers are NO, then may proceed with Cephalosporin use.    Ingrezza  [Valbenazine  Tosylate]     Pt reports repetitive body jerking, feeling depress and off balance.    Pravastatin  Other (See Comments)    Cause muscle aches and cramps   Rosuvastatin  Other (See Comments)    Caused pain in hands, shoulders, back, and indigestion    Review of Systems  Constitutional:  Negative for chills, fever and weight loss.  Cardiovascular:  Negative for chest pain.      Objective:     BP 120/80   Pulse 77   Temp 98.2 F (36.8 C) (Oral)   Ht 5' 1 (1.549 m)   Wt 137 lb (62.1 kg)   LMP 12/09/2013   SpO2 98%   BMI 25.89 kg/m  BP Readings from Last 3 Encounters:  05/29/24 120/80  02/26/24 104/68  01/13/24 104/65   Wt Readings from Last 3 Encounters:  05/29/24 137 lb (62.1 kg)  02/26/24 134 lb (60.8 kg)  11/08/23 126 lb (57.2 kg)      Physical Exam Vitals reviewed.  Constitutional:      General: She is not in acute distress.    Appearance: She is not ill-appearing.  Cardiovascular:     Rate and Rhythm: Normal rate and regular rhythm.  Pulmonary:     Comments: Left axillary and breast exam were performed with nurse chaperone present.  Left axilla reveals small fatty consistency mobile subcutaneous tissue approximately 2 x 3 cm.  Nontender.  No axillary adenopathy.  No left breast masses palpated.  No skin dimpling.  No nipple inversion. Neurological:     Mental Status: She is alert.      No results found for any visits on 05/29/24.    The 10-year ASCVD risk score (Arnett DK, et al., 2019) is: 4.7%    Assessment & Plan:   Probable lipoma left axilla.  No evidence to suggest adenopathy or hidradenitis changes.  No abscess.  No left breast masses on exam.  Patient had normal mammogram last January.  Recommend close observation and follow-up promptly for any rapid growth or pain or other changes.  She is getting regular yearly mammograms.  We explained if she  had any rapid growth or other concerns we could certainly proceed with diagnostic mammogram and ultrasound if indicated but this feels more lipomatous/ fatty  She mention over 1 year history of intermittent transient numbness right face and right arm.  Do recommend she gets these further evaluated and she plans to discuss with her new primary in January  Wolm Scarlet, MD

## 2024-07-07 ENCOUNTER — Telehealth: Admitting: Psychiatry

## 2024-07-07 ENCOUNTER — Encounter: Payer: Self-pay | Admitting: Psychiatry

## 2024-07-07 DIAGNOSIS — F3342 Major depressive disorder, recurrent, in full remission: Secondary | ICD-10-CM | POA: Insufficient documentation

## 2024-07-07 DIAGNOSIS — F411 Generalized anxiety disorder: Secondary | ICD-10-CM

## 2024-07-07 DIAGNOSIS — G4701 Insomnia due to medical condition: Secondary | ICD-10-CM

## 2024-07-07 NOTE — Progress Notes (Unsigned)
 Virtual Visit via Video Note  I connected with Lisa Lee on 07/07/24 at  1:00 PM EST by a video enabled telemedicine application and verified that I am speaking with the correct person using two identifiers.  Location Provider Location : [facility] Patient Location : Work  Participants: Patient , Provider    I discussed the limitations of evaluation and management by telemedicine and the availability of in person appointments. The patient expressed understanding and agreed to proceed.   I discussed the assessment and treatment plan with the patient. The patient was provided an opportunity to ask questions and all were answered. The patient agreed with the plan and demonstrated an understanding of the instructions.   The patient was advised to call back or seek an in-person evaluation if the symptoms worsen or if the condition fails to improve as anticipated.   BH MD OP Progress Note  07/07/2024 5:17 PM Lisa Lee  MRN:  [id number]  Chief Complaint:  Chief Complaint  Patient presents with   Follow-up   Anxiety   Depression   Medication Refill   Discussed the use of AI scribe software for clinical note transcription with the patient, who gave verbal consent to proceed.  History of Present Illness Lisa Lee is a 63 year old Caucasian female, married, currently employed, lives in [place], has a history of psychiatric diagnoses including bipolar disorder, generalized anxiety disorder, akathisia, tardive dyskinesia, hypertension, hyperlipidemia, gastroesophageal reflux disease, asthma, hypothyroidism was evaluated by telemedicine today.  Overall, she reports feeling really good since her last visit three months ago and denies experiencing significant depression symptoms. She describes her mood as stable, with no recent mood swings, hypomanic symptoms, or episodes of irritability or anger, except for 1 situational episode triggered by an interaction with  her husband. Ongoing marital stress, including emotional separation and lack of sexual intimacy, continues, but she states she manages the emotional aspects and does not wish to leave the marriage due to her need for security.  Her current medications include mirtazapine  15 mg at bedtime, Lexapro  10 mg daily, and clonazepam  as needed for sleep. She uses a quarter tablet of clonazepam  five nights per week, increasing to half to three-quarters of a tablet on Sunday nights to ensure adequate sleep before work. She follows a gradual reduction schedule for clonazepam , dropping 2 nights per week, and reports managing sleep well on those nights. She reports ongoing foot pain, numbness in her right face and hand, and neuropathy, which she has discussed with her podiatrist, chiropractor, and primary care doctor, and questioned whether these symptoms could be related to mirtazapine , though she does not believe they are.  She denies any thoughts of hurting herself or others.    Visit Diagnosis:    ICD-10-CM   1. Recurrent major depressive disorder, in full remission  F33.42     2. Generalized anxiety disorder  F41.1     3. Insomnia, unspecified type  G47.00       Past Psychiatric History: I have reviewed past psychiatric history from progress note on 01/27/2024.  Past trials of medications like Lunesta , Lexapro , lithium , Latuda , modafinil , Xanax .  Past Medical History:  Past Medical History:  Diagnosis Date   Acid reflux    Asthma    Bipolar disorder (HCC)    Depression    Diarrhea 08/09/2011   Poss ibs  Vs other  Related to stress  consdier other eval if needed. Had colonoscoopy per dr Donnald    High cholesterol  History of hypertension    no meds for last year  per pt on 03-08-2022   HPV in female 1984   Hypothyroidism    PMB (postmenopausal bleeding)    Thyroid  disease     Past Surgical History:  Procedure Laterality Date   CERVICAL CONIZATION W/BX N/A 07/21/2015   Procedure:  CONIZATION CERVIX WITH BIOPSY;  Surgeon: Rome Rigg, MD;  Location: [facility];  Service: Gynecology;  Laterality: N/A;   CERVICAL CONIZATION W/BX N/A 09/25/2018   Procedure: CONIZATION CERVIX WITH BIOPSY;  Surgeon: Eloy Herring, MD;  Location: [facility];  Service: Gynecology;  Laterality: N/A;   CERVICAL CRYOTHERAPY  07/30/1982   colonscopy  07/04/2018   COLPOSCOPY  08/26/2013   DILATATION & CURETTAGE/HYSTEROSCOPY WITH MYOSURE N/A 03/16/2022   Procedure: ATTEMPTED DILATATION & CURETTAGE/ATTEMPTED HYSTEROSCOPY WITH MYOSURE, POLYPECTOMY;  Surgeon: Estelle Service, MD;  Location: [facility];  Service: Gynecology;  Laterality: N/A;   DILATION AND CURETTAGE, DIAGNOSTIC / THERAPEUTIC  07/30/1992   Blighted Ovum   HYSTEROSCOPY     uterine polypectomy Jan 7th   HYSTEROSCOPY WITH RESECTOSCOPE  08/04/2009   Removed polyp & IUD   LYMPH NODE BIOPSY     40 yrs ago   OPERATIVE ULTRASOUND N/A 03/16/2022   Procedure: OPERATIVE ULTRASOUND;  Surgeon: Estelle Service, MD;  Location: [facility] ;  Service: Gynecology;  Laterality: N/A;   TONSILLECTOMY     as child   UPPER GASTROINTESTINAL ENDOSCOPY  01/11/2023   UPPER GI ENDOSCOPY  07/04/2018    Family Psychiatric History: I have reviewed family psychiatric history from progress note on 01/27/2024.  Family History:  Family History  Problem Relation Age of Onset   Depression Mother    Arthritis Mother    Heart disease Mother    Rectal cancer Father    Heart disease Father    Colon cancer Father    Parkinson's disease Father    Depression Sister    Heart disease Sister 46       cabg stent   Depression Sister    Heart disease Sister    Thyroid  disease Sister    Heart disease Brother    Suicidality Cousin    Drug abuse Daughter    Anxiety disorder Daughter    Depression Daughter    Pulmonary embolism Daughter    Anxiety disorder Daughter    Thyroid  disease Daughter    Esophageal cancer Neg  Hx    Stomach cancer Neg Hx     Social History: I have reviewed social history from progress note on 01/27/2024. Social History   Socioeconomic History   Marital status: Married    Spouse name: Not on file   Number of children: Not on file   Years of education: Not on file   Highest education level: Bachelor's degree (e.g., BA, AB, BS)  Occupational History   Not on file  Tobacco Use   Smoking status: Never   Smokeless tobacco: Never  Vaping Use   Vaping status: Never Used  Substance and Sexual Activity   Alcohol use: Yes    Alcohol/week: 0.0 standard drinks of alcohol    Comment: monthly ocassional drink   Drug use: No   Sexual activity: Yes    Birth control/protection: None  Other Topics Concern   Not on file  Social History Narrative    [employer] 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    Divorced   Regular exercise-  Not as much recently    [facility] of 2   Pets 2 cats 1 dog to move   Daughter  On recovery heroin   Social Drivers of Health   Financial Resource Strain: Low Risk  (07/29/2023)   Overall Financial Resource Strain (CARDIA)    Difficulty of Paying Living Expenses: Not hard at all  Food Insecurity: No Food Insecurity (07/29/2023)   Hunger Vital Sign    Worried About Running Out of Food in the Last Year: Never true    Ran Out of Food in the Last Year: Never true  Transportation Needs: No Transportation Needs (07/29/2023)   PRAPARE - Administrator, Civil Service (Medical): No    Lack of Transportation (Non-Medical): No  Physical Activity: Insufficiently Active (07/29/2023)   Exercise Vital Sign    Days of Exercise per Week: 2 days    Minutes of Exercise per Session: 60 min  Stress: No Stress Concern Present (07/29/2023)   [employer] of Occupational Health - Occupational Stress Questionnaire    Feeling of Stress : Not at all  Social Connections: Moderately Isolated (07/29/2023)   Social Connection and  Isolation Panel    Frequency of Communication with Friends and Family: More than three times a week    Frequency of Social Gatherings with Friends and Family: Once a week    Attends Religious Services: Never    Database Administrator or Organizations: No    Attends Engineer, Structural: Not on file    Marital Status: Married    Allergies:  Allergies  Allergen Reactions   Penicillins Hives, Shortness Of Breath and Swelling    Has patient had a PCN reaction causing immediate rash, facial/tongue/throat swelling, SOB or lightheadedness with hypotension: Yes Has patient had a PCN reaction causing severe rash involving mucus membranes or skin necrosis: No Has patient had a PCN reaction that required hospitalization No Has patient had a PCN reaction occurring within the last 10 years: No If all of the above answers are NO, then may proceed with Cephalosporin use.    Ingrezza  [Valbenazine  Tosylate]     Pt reports repetitive body jerking, feeling depress and off balance.    Pravastatin  Other (See Comments)    Cause muscle aches and cramps   Rosuvastatin  Other (See Comments)    Caused pain in hands, shoulders, back, and indigestion    Metabolic Disorder Labs: Lab Results  Component Value Date   HGBA1C 6.1 07/30/2023   MPG 103 03/29/2020   No results found for: PROLACTIN Lab Results  Component Value Date   CHOL 168 07/30/2023   TRIG 171.0 (H) 07/30/2023   HDL 53.60 07/30/2023   CHOLHDL 3 07/30/2023   VLDL 34.2 07/30/2023   LDLCALC 80 07/30/2023   LDLCALC 70 04/23/2022   Lab Results  Component Value Date   TSH 1.06 07/30/2023   TSH 2.33 01/30/2023    Therapeutic Level Labs: Lab Results  Component Value Date   LITHIUM  0.8 04/11/2021   LITHIUM  0.6 09/08/2020   No results found for: VALPROATE No results found for: CBMZ  Current Medications: Current Outpatient Medications  Medication Sig Dispense Refill   albuterol  (PROAIR  HFA) 108 (90 Base) MCG/ACT  inhaler Inhale 2 puffs into the lungs every 6 (six) hours as needed for wheezing. 1 each 1   aspirin 81 MG chewable tablet Chew 81 mg by mouth daily.     atorvastatin  (LIPITOR) 10 MG tablet Take 1 tablet (10 mg total) by mouth  daily. 90 tablet 3   Cholecalciferol (VITAMIN D ) 2000 units tablet Take 2,000 Units by mouth daily.     clonazePAM  (KLONOPIN ) 0.5 MG tablet Take 0.5-1 tablets (0.25-0.5 mg total) by mouth daily as needed for anxiety.     escitalopram  (LEXAPRO ) 10 MG tablet Take 1 tablet (10 mg total) by mouth daily with breakfast. 90 tablet 1   esomeprazole  (NEXIUM ) 40 MG capsule Take 1 capsule (40 mg total) by mouth daily before breakfast. 90 capsule 3   estradiol  (ESTRACE) 0.1 MG/GM vaginal cream Place vaginally.     Evolocumab  (REPATHA  SURECLICK) 140 MG/ML SOAJ Inject 140 mg into the skin every 14 (fourteen) days. 6 mL 3   ezetimibe  (ZETIA ) 10 MG tablet Take 1 tablet (10 mg total) by mouth daily. 90 tablet 3   famotidine  (PEPCID ) 40 MG tablet Take 1 tablet (40 mg total) by mouth at bedtime. 90 tablet 3   fenofibrate  160 MG tablet Take 1 tablet (160 mg total) by mouth at bedtime. 90 tablet 3   levothyroxine  (SYNTHROID ) 75 MCG tablet TAKE 1 TABLET BY MOUTH EVERY DAY BEFORE BREAKFAST 90 tablet 1   loperamide (IMODIUM A-D) 2 MG tablet Take 2 mg by mouth as needed.     mirtazapine  (REMERON ) 15 MG tablet Take 1 tablet (15 mg total) by mouth at bedtime. 90 tablet 1   Multiple Vitamins-Minerals (MULTIVITAMIN WITH MINERALS) tablet Take 1 tablet by mouth daily.     OVER THE COUNTER MEDICATION Calcium  supplement 600mg  with vitamin D  500 units- BID     telmisartan  (MICARDIS ) 40 MG tablet Take 1 tablet (40 mg total) by mouth daily. 90 tablet 3   valACYclovir  (VALTREX ) 500 MG tablet TAKE 1 TABLET DAILY FOR SUPPRESSION AND 1 TABLET TWO TIMES A DAY FOR 3 DAYS FOR OUTBREAK Strength: 500 mg 90 tablet 3   Current Facility-Administered Medications  Medication Dose Route Frequency Provider Last Rate Last  Admin   0.9 %  sodium chloride  infusion  500 mL Intravenous Once Armbruster, Elspeth SQUIBB, MD         Musculoskeletal: Strength & Muscle Tone: UTA Gait & Station: Seated Patient leans: N/A  Psychiatric Specialty Exam: Review of Systems  Psychiatric/Behavioral:  The patient is nervous/anxious.     Last menstrual period 12/09/2013.There is no height or weight on file to calculate BMI.  General Appearance: Casual  Eye Contact:  Fair  Speech:  Clear and Coherent  Volume:  Normal  Mood:  Anxious  Affect:  Appropriate  Thought Process:  Goal Directed and Descriptions of Associations: Intact  Orientation:  Full (Time, Place, and Person)  Thought Content: Logical   Suicidal Thoughts:  No  Homicidal Thoughts:  No  Memory:  Immediate;   Fair Recent;   Fair Remote;   Fair  Judgement:  Fair  Insight:  Fair  Psychomotor Activity:  Normal  Concentration:  Concentration: Fair and Attention Span: Fair  Recall:  [employer] of Knowledge: Fair  Language: Fair  Akathisia:  No  Handed:  Right  AIMS (if indicated): not done  Assets:  Manufacturing Systems Engineer Desire for Improvement Housing Social Support Transportation  ADL's:  Intact  Cognition: WNL  Sleep:  Fair   Screenings: Midwife Visit from 01/27/2024 in [facility]  AIMS Total Score 7   GAD-7    Flowsheet Row Office Visit from 01/27/2024 in [facility] Office Visit from 07/09/2023 in [facility]  at [facility]  Total GAD-7 Score 4 4   PHQ2-9    Flowsheet Row Video Visit from 04/07/2024 in [facility] Office Visit from 01/27/2024 in [facility] Office Visit from 07/09/2023 in [facility] [place] Office Visit from 04/23/2022 in [facility] [place]  PHQ-2 Total Score 0 1 0 0  PHQ-9 Total  Score -- -- 3 2   Flowsheet Row Video Visit from 04/07/2024 in [facility] Office Visit from 01/27/2024 in [facility] ED from 11/08/2023 in [facility] Department at [facility]  C-SSRS RISK CATEGORY Moderate Risk No Risk No Risk     Assessment and Plan: Rhett Najera Lee is a 63 year old Caucasian female who presented for a follow-up appointment, discussed assessment and plan as noted below.  1. Recurrent major depressive disorder, in full remission Currently denies any significant depression symptoms.  Does report neuropathy like symptoms and is currently under the care of a provider for management of the same.  Discussed with patient to stop the mirtazapine  if she believes mirtazapine  likely contributing to her symptoms.  She is not interested at this time. Continue mirtazapine  15 mg at bedtime Continue Lexapro  10 mg daily  2. Generalized anxiety disorder-stable Reports anxiety is managed on the current medication regimen. Continue Lexapro  10 mg daily Continue clonazepam  0.125 mg - 0.25 mg as needed at bedtime,, currently tapering down and skipping couple of days a week. Reviewed [place] PMP AWARxE   3. Insomnia, due to medical condition-improving Currently reports sleep is better. Continue mirtazapine  as prescribed Continue sleep hygiene techniques.  Follow-up Follow-up in clinic in 3 months or sooner if needed.   Consent: Patient/Guardian gives verbal consent for treatment and assignment of benefits for services provided during this visit. Patient/Guardian expressed understanding and agreed to proceed.   This note was generated in part or whole with voice recognition software. Voice recognition is usually quite accurate but there are transcription errors that can and very often do occur. I apologize for any typographical errors that were not detected and corrected.    Kierston Plasencia, MD 07/07/2024, 5:17 PM

## 2024-07-13 ENCOUNTER — Other Ambulatory Visit: Payer: Self-pay | Admitting: Internal Medicine

## 2024-07-13 DIAGNOSIS — R072 Precordial pain: Secondary | ICD-10-CM

## 2024-07-13 DIAGNOSIS — E785 Hyperlipidemia, unspecified: Secondary | ICD-10-CM

## 2024-07-20 ENCOUNTER — Other Ambulatory Visit: Payer: Self-pay | Admitting: Family

## 2024-08-03 ENCOUNTER — Other Ambulatory Visit: Payer: Self-pay | Admitting: Internal Medicine

## 2024-08-05 DIAGNOSIS — G4701 Insomnia due to medical condition: Secondary | ICD-10-CM | POA: Diagnosis not present

## 2024-08-06 ENCOUNTER — Encounter: Payer: Self-pay | Admitting: Internal Medicine

## 2024-08-07 MED ORDER — TRAZODONE HCL 50 MG PO TABS: 25.0000 mg | ORAL_TABLET | Freq: Every evening | ORAL | 0 refills | Status: AC | PRN

## 2024-08-07 NOTE — Telephone Encounter (Signed)
 Patient interested in trial of a new sleep medication, provided multiple options including tapering of mirtazapine  and addition of a new sleep medication.  Patient would like to stay on the mirtazapine  at this time since it is beneficial with her mood symptoms.  She agrees to low-dose trazodone  along with the mirtazapine .  I have sent trazodone  25 mg to pharmacy at this time.  Provided medication education.  Patient is interested in tapering herself off of the clonazepam  gradually.   I have spent at least 6 minutes non face to face with patient today .

## 2024-08-12 ENCOUNTER — Other Ambulatory Visit: Payer: Self-pay | Admitting: Medical Genetics

## 2024-08-17 ENCOUNTER — Encounter: Payer: Self-pay | Admitting: Family Medicine

## 2024-08-17 ENCOUNTER — Other Ambulatory Visit: Payer: Self-pay | Admitting: Internal Medicine

## 2024-08-17 ENCOUNTER — Ambulatory Visit: Admitting: Family Medicine

## 2024-08-17 VITALS — BP 116/70 | HR 73 | Temp 98.7°F | Resp 14 | Ht 61.0 in | Wt 139.8 lb

## 2024-08-17 DIAGNOSIS — R202 Paresthesia of skin: Secondary | ICD-10-CM

## 2024-08-17 DIAGNOSIS — E785 Hyperlipidemia, unspecified: Secondary | ICD-10-CM | POA: Diagnosis not present

## 2024-08-17 DIAGNOSIS — E039 Hypothyroidism, unspecified: Secondary | ICD-10-CM

## 2024-08-17 DIAGNOSIS — G4700 Insomnia, unspecified: Secondary | ICD-10-CM

## 2024-08-17 DIAGNOSIS — R200 Anesthesia of skin: Secondary | ICD-10-CM

## 2024-08-17 DIAGNOSIS — D1722 Benign lipomatous neoplasm of skin and subcutaneous tissue of left arm: Secondary | ICD-10-CM

## 2024-08-17 DIAGNOSIS — Z79899 Other long term (current) drug therapy: Secondary | ICD-10-CM | POA: Diagnosis not present

## 2024-08-17 DIAGNOSIS — R072 Precordial pain: Secondary | ICD-10-CM

## 2024-08-17 DIAGNOSIS — I10 Essential (primary) hypertension: Secondary | ICD-10-CM

## 2024-08-17 LAB — COMPREHENSIVE METABOLIC PANEL WITH GFR
ALT: 66 U/L — ABNORMAL HIGH (ref 3–35)
AST: 42 U/L — ABNORMAL HIGH (ref 5–37)
Albumin: 4.9 g/dL (ref 3.5–5.2)
Alkaline Phosphatase: 48 U/L (ref 39–117)
BUN: 28 mg/dL — ABNORMAL HIGH (ref 6–23)
CO2: 25 meq/L (ref 19–32)
Calcium: 10.5 mg/dL (ref 8.4–10.5)
Chloride: 103 meq/L (ref 96–112)
Creatinine, Ser: 1.08 mg/dL (ref 0.40–1.20)
GFR: 54.17 mL/min — ABNORMAL LOW
Glucose, Bld: 80 mg/dL (ref 70–99)
Potassium: 4.2 meq/L (ref 3.5–5.1)
Sodium: 139 meq/L (ref 135–145)
Total Bilirubin: 0.3 mg/dL (ref 0.2–1.2)
Total Protein: 7.6 g/dL (ref 6.0–8.3)

## 2024-08-17 LAB — VITAMIN B12: Vitamin B-12: 627 pg/mL (ref 211–911)

## 2024-08-17 LAB — LIPID PANEL
Cholesterol: 99 mg/dL (ref 28–200)
HDL: 58.9 mg/dL
LDL Cholesterol: 15 mg/dL (ref 10–99)
NonHDL: 40.37
Total CHOL/HDL Ratio: 2
Triglycerides: 129 mg/dL (ref 10.0–149.0)
VLDL: 25.8 mg/dL (ref 0.0–40.0)

## 2024-08-17 LAB — TSH: TSH: 0.9 u[IU]/mL (ref 0.35–5.50)

## 2024-08-17 LAB — MAGNESIUM: Magnesium: 2.1 mg/dL (ref 1.5–2.5)

## 2024-08-17 NOTE — Progress Notes (Signed)
 "  Subjective:  Patient ID: Lisa Lee, female    DOB: 1961/01/13  Age: 64 y.o. MRN: [id number]  CC:  Chief Complaint  Patient presents with   Transitions Of Care    Establish care with Dr. Levora. Nerve pain both feet sx started 1 year ago. Left arm soft mass in arm pit. She saw someone about it before and they said it was nothing. Wants to discuss osteoporosis.     HPI Lisa Lee presents for  New patient to establish care, previous primary care provider Dr. Charlett I care for her husband Norleen Redo. 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   Psychiatry, Dr. Eappen at behavioral health.  Treated with Lexapro , mirtazapine  for anxiety, mood disorder, insomnia.  Discussed possible tardive dyskinesia, akathisia at June 2025 visit.  Previous patient of Verneita cooks at [facility] psychiatric and that appointment was a second opinion.  Likely akathisia with the abnormal involuntary movements of face and internal restlessness.  Had been diagnosed with tremors, right hemifacial spasm with Botox recommended by neurology previously.  Declined referral to neurology by psychiatry at her June 2025 visit.  Continued on clonazepam , Lexapro , mirtazapine .  Dosage of Lexapro  was decreased.  Mirtazapine  was increased.  Option of addition of mood stabilizer.  Plan for long-term tapering of benzodiazepine.  Most recent visit noted December 9.  Option to stop mirtazapine  if thought to be causing the neuropathy symptoms but that time.  Was continued on Lexapro  10 mg daily, mirtazapine  15 mg at bedtime.  Last p.m. 0.125 mg to 0.25 mg as needed at bedtime with tapering down, skipping couple days a week.  12-month follow-up.  She feels like new meds are working well, and happy with current psychiatric care. On trazodone  now for sleep as needed and same dose klonopin . Some weight gain with remeron  - few pounds.  Cardiology, Dr. Wendel, mild coronary artery disease, hyperlipidemia,, Aortic  atherosclerosis, elevated lipoprotein a, stage IIIa CKD.  Treated with statin, aspirin, fenofibrate , Repatha , telmisartan  Not fasting today. Lab Results  Component Value Date   CREATININE 1.02 (H) 11/20/2023   Hypothyroidism: Lab Results  Component Value Date   TSH 1.06 07/30/2023  Treated with Synthroid  75 mcg daily.  Stable on last testing just over a year ago. Prior lithium . Not recent.  does report some dysesthesias into her feet.  Past year.  No history of diabetes. Stable annually.   Left axilla abnormality She was seen October 31 for concern of left axilla lesion.  Suspected lipomatous mass.  Monitoring for any rapid growth pain or other new findings, option of ultrasound, diagnostic mammogram if growth or other changes. No chages since that time. Mammogram next week.   Asthma, GERD Cronic cough prior. Hx of reflux - nexium , pepcid . GI: Dr. Leigh. Controlled.  Albuterol  as needed - last used a month ago. Rare need,   Osteoporosis screening: Bone density 07/26/2022, T-score -2.5 at right femoral neck, -2.0 left femoral neck, -0.6 at lumbar spine. Has been on fosamax  - 1 year, stopped last year with nausea, thought to be due to other meds. vit D, calcium .  Has discussed with GYN, will see them this summer and discuss with her- Donny Bunker.   Hx of Genital HSV Outbreak over a year ago. valtrex  500mg  every day.   Chronic foot pain: Pain in toes, left foot more than right. Initially thought was shoes. Saw podiatry. Told had nerve impingement. 2 weeks of lyrica helped, but no further per psychiatry. Saw chiropractor -  had exercises, laser treatment, new shoes - no relief. Some numbness in face at times or in hands, possibly positional. Less frequent off work. No weakness. Tingling in feet past year, face, hands past few months., notices in hands with driving. Question of neck source.  No neck pain. No extremity weakness.  Psychiatrist did not think symptoms are coming from her  meds.  Does have some relief with voltaren gel.  Has not seen neuro recently - in past some of her sx's thought to be due to psychiatric issues. Would like to see different neuro if needed.  Tried magnesium  temporarily last year- not now. No change B12 since August - about 4 days per week.    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 (HCC) 06/17/2018   Abnormal cervical Papanicolaou smear 04/03/2017   Atrophic vaginitis 04/03/2017   Cervical intraepithelial neoplasia grade 1 04/03/2017   Unspecified dyspareunia (CODE) 04/03/2017   Genital herpes simplex 04/03/2017   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   Menopausal symptom 04/03/2017   Skin lesion 04/03/2017   Hyperlipidemia 10/29/2015   Serum calcium  elevated 10/26/2014   Recurrent UTI 02/05/2014   Upper airway cough syndrome 10/07/2013   Uncomplicated asthma 10/07/2013   Gastro-esophageal reflux disease without esophagitis 10/07/2013   Loose stools 10/07/2013   Thumb pain 11/21/2012   History of depression 11/21/2012   High risk medication use 11/21/2012   Lithium  use 09/03/2012   Rosacea 06/02/2012   Medication monitoring encounter 06/02/2012   Mood disorder in conditions classified elsewhere 06/02/2012   Perimenopausal 06/02/2012   Major depressive disorder, single episode 06/02/2012   Cardiovascular risk factor 08/28/2011   FH: premature coronary heart disease 08/09/2011   ESOPHAGEAL REFLUX 08/02/2010    PLANTAR FASCIITIS, RIGHT 08/02/2010   OTHER DISEASES OF NASAL CAVITY AND SINUSES 04/20/2010   HYPERLIPIDEMIA 06/15/2009   TENDINITIS, RIGHT KNEE 08/11/2008   Allergic rhinitis 05/14/2007   Hypothyroidism 03/03/2007   ECZEMA, HANDS 03/03/2007   POSTURAL LIGHTHEADEDNESS 03/03/2007   DEPRESSION 02/27/2007   Cough variant asthma  vs  Eos bronchitis 02/27/2007   Past Medical History:  Diagnosis Date   Acid reflux    Allergy  Childhood   Penicillin   Asthma    Bipolar disorder (HCC)    Depression    Diarrhea 08/09/2011   Poss ibs  Vs other  Related to stress  consdier other eval if needed. Had colonoscoopy per dr Donnald    High cholesterol    History of hypertension    no meds for last year  per pt on 03-08-2022   HPV in female 1984   Hypothyroidism    Neuromuscular disorder [facility]) May 2020   Left greater trochanter bursitis.   Osteoporosis 12/23   Osteoporosis on dexa scan   PMB (postmenopausal bleeding)    Thyroid  disease    Past Surgical History:  Procedure Laterality Date   CERVICAL CONIZATION W/BX N/A 07/21/2015   Procedure: CONIZATION CERVIX WITH BIOPSY;  Surgeon: Rome Rigg, MD;  Location: [facility];  Service: Gynecology;  Laterality: N/A;  CERVICAL CONIZATION W/BX N/A 09/25/2018   Procedure: CONIZATION CERVIX WITH BIOPSY;  Surgeon: Eloy Herring, MD;  Location: [facility];  Service: Gynecology;  Laterality: N/A;   CERVICAL CRYOTHERAPY  07/30/1982   colonscopy  07/04/2018   COLPOSCOPY  08/26/2013   DILATATION & CURETTAGE/HYSTEROSCOPY WITH MYOSURE N/A 03/16/2022   Procedure: ATTEMPTED DILATATION & CURETTAGE/ATTEMPTED HYSTEROSCOPY WITH MYOSURE, POLYPECTOMY;  Surgeon: Estelle Service, MD;  Location: [facility];  Service: Gynecology;  Laterality: N/A;   DILATION AND CURETTAGE, DIAGNOSTIC / THERAPEUTIC  07/30/1992   Blighted Ovum   EYE SURGERY  2007   Ptosis repair   HYSTEROSCOPY     uterine polypectomy Jan 7th   HYSTEROSCOPY WITH  RESECTOSCOPE  08/04/2009   Removed polyp & IUD   LYMPH NODE BIOPSY     40 yrs ago   OPERATIVE ULTRASOUND N/A 03/16/2022   Procedure: OPERATIVE ULTRASOUND;  Surgeon: Estelle Service, MD;  Location: [facility];  Service: Gynecology;  Laterality: N/A;   TONSILLECTOMY     as child   UPPER GASTROINTESTINAL ENDOSCOPY  01/11/2023   UPPER GI ENDOSCOPY  07/04/2018   Allergies[1] Prior to Admission medications  Medication Sig Start Date End Date Taking? Authorizing Provider  albuterol  (PROAIR  HFA) 108 (90 Base) MCG/ACT inhaler Inhale 2 puffs into the lungs every 6 (six) hours as needed for wheezing. 07/09/23  Yes Panosh, Wanda K, MD  aspirin 81 MG chewable tablet Chew 81 mg by mouth daily.   Yes [provider]  atorvastatin  (LIPITOR) 10 MG tablet Take 1 tablet (10 mg total) by mouth daily. 12/03/23  Yes Thukkani, Arun K, MD  Cholecalciferol (VITAMIN D ) 2000 units tablet Take 2,000 Units by mouth daily. Patient taking differently: Take 2,000 Units by mouth 2 (two) times daily.   Yes [provider]  clonazePAM  (KLONOPIN ) 0.5 MG tablet Take 0.5-1 tablets (0.25-0.5 mg total) by mouth daily as needed for anxiety. Patient taking differently: Take 0.25 mg by mouth 2 (two) times a week. 04/08/24  Yes Eappen, Saramma, MD  diclofenac Sodium (VOLTAREN) 1 % GEL Apply 2 g topically 4 (four) times daily.   Yes [provider]  escitalopram  (LEXAPRO ) 10 MG tablet Take 1 tablet (10 mg total) by mouth daily with breakfast. 04/07/24  Yes Eappen, Saramma, MD  esomeprazole  (NEXIUM ) 40 MG capsule Take 1 capsule (40 mg total) by mouth daily before breakfast. 02/26/24 02/20/25 Yes [place], PA-C  estradiol  (ESTRACE) 0.1 MG/GM vaginal cream Place vaginally. Patient taking differently: Place 1 Applicatorful vaginally as needed.   Yes [provider]  Evolocumab  (REPATHA  SURECLICK) 140 MG/ML SOAJ Inject 140 mg into the skin every 14 (fourteen) days. 12/04/23  Yes Thukkani,  Arun K, MD  ezetimibe  (ZETIA ) 10 MG tablet Take 1 tablet (10 mg total) by mouth daily. 08/21/23  Yes Thukkani, Arun K, MD  famotidine  (PEPCID ) 40 MG tablet Take 1 tablet (40 mg total) by mouth at bedtime. 02/26/24 02/20/25 Yes [place], PA-C  fenofibrate  160 MG tablet TAKE 1 TABLET BY MOUTH AT BEDTIME 07/15/24  Yes Thukkani, Arun K, MD  levothyroxine  (SYNTHROID ) 75 MCG tablet TAKE 1 TABLET BY MOUTH EVERY DAY BEFORE BREAKFAST 07/20/24  Yes Panosh, Wanda K, MD  loperamide (IMODIUM A-D) 2 MG tablet Take 2 mg by mouth as needed.   Yes [provider]  mirtazapine  (REMERON ) 15 MG tablet Take 1 tablet (15 mg total) by mouth at bedtime. 04/07/24  Yes Eappen, Saramma, MD  Multiple Vitamins-Minerals (MULTIVITAMIN WITH MINERALS) tablet  Take 1 tablet by mouth daily.   Yes [provider]  OVER THE COUNTER MEDICATION Calcium  supplement 600mg  with vitamin D  500 units- BID   Yes [provider]  OVER THE COUNTER MEDICATION Take 1 tablet by mouth daily.   Yes [provider]  telmisartan  (MICARDIS ) 40 MG tablet TAKE 1 TABLET BY MOUTH DAILY 08/05/24  Yes Thukkani, Arun K, MD  traZODone  (DESYREL ) 50 MG tablet Take 0.5 tablets (25 mg total) by mouth at bedtime as needed for sleep. 08/07/24  Yes Eappen, Saramma, MD  valACYclovir  (VALTREX ) 500 MG tablet TAKE 1 TABLET DAILY FOR SUPPRESSION AND 1 TABLET TWO TIMES A DAY FOR 3 DAYS FOR OUTBREAK Strength: 500 mg 12/04/23  Yes Panosh, Apolinar POUR, MD   Social History   Socioeconomic History   Marital status: Married    Spouse name: Not on file   Number of children: Not on file   Years of education: Not on file   Highest education level: Bachelor's degree (e.g., BA, AB, BS)  Occupational History   Not on file  Tobacco Use   Smoking status: Never   Smokeless tobacco: Never  Vaping Use   Vaping status: Never Used  Substance and Sexual Activity   Alcohol use: Yes    Alcohol/week: 0.0 standard drinks of alcohol    Comment: monthly  ocassional drink   Drug use: No   Sexual activity: Yes    Birth control/protection: None  Other Topics Concern   Not on file  Social History Narrative    [employer] 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    Divorced   Regular exercise-  Not as much recently    [facility] of [facility]   Pets 2 cats 1 dog to move   Daughter  On recovery heroin   Social Drivers of Health   Tobacco Use: Low Risk (08/17/2024)   Patient History    Smoking Tobacco Use: Never    Smokeless Tobacco Use: Never    Passive Exposure: Not on file  Financial Resource Strain: Low Risk (07/29/2023)   Overall Financial Resource Strain (CARDIA)    Difficulty of Paying Living Expenses: Not hard at all  Food Insecurity: No Food Insecurity (07/29/2023)   Hunger Vital Sign    Worried About Running Out of Food in the Last Year: Never true    Ran Out of Food in the Last Year: Never true  Transportation Needs: No Transportation Needs (07/29/2023)   PRAPARE - Administrator, Civil Service (Medical): No    Lack of Transportation (Non-Medical): No  Physical Activity: Insufficiently Active (07/29/2023)   Exercise Vital Sign    Days of Exercise per Week: 2 days    Minutes of Exercise per Session: 60 min  Stress: No Stress Concern Present (07/29/2023)   [employer] of Occupational Health - Occupational Stress Questionnaire    Feeling of Stress : Not at all  Social Connections: Moderately Isolated (07/29/2023)   Social Connection and Isolation Panel    Frequency of Communication with Friends and Family: More than three times a week    Frequency of Social Gatherings with Friends and Family: Once a week    Attends Religious Services: Never    Database Administrator or Organizations: No    Attends Banker Meetings: Not on file    Marital Status: Married  Intimate Partner Violence: Unknown (11/02/2021)   [facility]   HITS  Physically Hurt: Not on file     Insult or Talk Down To: Not on file    Threaten Physical Harm: Not on file    Scream or Curse: Not on file  Depression (PHQ2-9): Low Risk (04/07/2024)   Depression (PHQ2-9)    PHQ-2 Score: 0  Alcohol Screen: Low Risk (07/29/2023)   Alcohol Screen    Last Alcohol Screening Score (AUDIT): 1  Housing: Low Risk (07/29/2023)   Housing Stability Vital Sign    Unable to Pay for Housing in the Last Year: No    Number of Times Moved in the Last Year: 0    Homeless in the Last Year: No  Utilities: Not on file  Health Literacy: Not on file    Review of Systems Per HPI  Objective:   Vitals:   08/17/24 1311  BP: 116/70  Pulse: 73  Resp: 14  Temp: 98.7 F (37.1 C)  TempSrc: Temporal  SpO2: 96%  Weight: 139 lb 12.8 oz (63.4 kg)  Height: 5' 1 (1.549 m)     Physical Exam Vitals reviewed.  Constitutional:      Appearance: Normal appearance. She is well-developed.  HENT:     Head: Normocephalic and atraumatic.  Eyes:     Conjunctiva/sclera: Conjunctivae normal.     Pupils: Pupils are equal, round, and reactive to light.  Neck:     Vascular: No carotid bruit.  Cardiovascular:     Rate and Rhythm: Normal rate and regular rhythm.     Heart sounds: Normal heart sounds.  Pulmonary:     Effort: Pulmonary effort is normal.     Breath sounds: Normal breath sounds.  Chest:    Abdominal:     Palpations: Abdomen is soft. There is no pulsatile mass.     Tenderness: There is no abdominal tenderness.  Musculoskeletal:     Right lower leg: No edema.     Left lower leg: No edema.  Skin:    General: Skin is warm and dry.  Neurological:     Mental Status: She is alert and oriented to person, place, and time.  Psychiatric:        Mood and Affect: Mood normal.        Behavior: Behavior normal.    I personally spent a total of 60 minutes in the care of the patient today including preparing to see the patient, getting/reviewing separately obtained history, performing a medically  appropriate exam/evaluation, counseling and educating, placing orders, and documenting clinical information in the EHR.    Assessment & Plan:  Lisa Lee is a 64 y.o. female . Hypothyroidism, unspecified type - Plan: TSH  - Check updated TSH and adjust regimen accordingly, continue same dose Synthroid  for now  Lipoma of left axilla  - Suspected lipoma without recent changes.  Option of dedicated ultrasound, mammogram of axilla, but she plans to start with screening mammogram as above and will let me know if additional imaging requested.  Monitor for changes.  Would image if any new symptoms.  Hyperlipidemia, unspecified hyperlipidemia type - Plan: Comprehensive metabolic panel with GFR, Lipid panel  - Tolerating current med regimen, check labs and adjust plan accordingly.  Not fasting, will interpret results accordingly.  Insomnia, unspecified type  - Followed by psychiatry as above, happy with current care and med changes, sleep has improved.  Hypertension, unspecified type  - Stable on current regimen.  Continue Micardis , labs as above and adjust plan accordingly.  Followed by cardiology as above.  Numbness and tingling of foot - Plan: B12, Magnesium  Hand tingling - Plan: B12, Magnesium  Tingling of face - Plan: B12, Magnesium  Long-term current use of proton pump inhibitor therapy  - Various areas of dysesthesias as above including tingling, pain in feet.  Question neuropathy.  She has been on long-term use of PPI, will check B12 and magnesium  initially, would consider neurology follow-up to decide on neuroimaging versus nerve conduction studies or further testing.  She would like to see other neurologist for second opinion possibly but will follow-up to discuss plan further in the next month and depending on initial lab results.  Plan discussed for most recent bone density testing osteoporosis, would like to follow-up with GYN to review plan further.  Will follow-up as  scheduled.  No orders of the defined types were placed in this encounter.  Patient Instructions  As we discussed I will check some labs that may be contributing to the tingling in the feet or hands, possibly face tingling.  However if those labs are normal I think neurology would be the next step to decide if nerve conduction studies or neuroimaging may be appropriate next. I will check your thyroid  test, electrolytes, cholesterol levels today.  Triglycerides may be elevated if you are not fasting but LDL level should still be helpful.  Keep follow-up with cardiology as planned. Area in the left axilla certainly could be a lipoma.  I am happy to schedule an ultrasound or diagnostic mammogram of that area, let me know after you have your upcoming routine mammogram if you would like to proceed with those studies. Last bone density test in 2023 did indicate osteoporosis, but barely at -2.5.  If you have a follow-up with your gynecologist this summer for repeat testing and medication discussion I think that would be reasonable.  Calcium  and vitamin D  supplementation for now. Let me know if any refills needed.  Follow-up in 1 month and decide next step for hand, feet, face symptoms as well as the area in the left axilla.  Let me know if there are questions in the meantime.     Signed,   Reyes Pines, MD [facility], [facility] 08/17/24 1:20 PM      [1]  Allergies Allergen Reactions   Penicillins Hives, Shortness Of Breath and Swelling    Has patient had a PCN reaction causing immediate rash, facial/tongue/throat swelling, SOB or lightheadedness with hypotension: Yes Has patient had a PCN reaction causing severe rash involving mucus membranes or skin necrosis: No Has patient had a PCN reaction that required hospitalization No Has patient had a PCN reaction occurring within the last 10 years: No If all of the above answers are NO, then may  proceed with Cephalosporin use.    Ingrezza  [Valbenazine  Tosylate]     Pt reports repetitive body jerking, feeling depress and off balance.    Pravastatin  Other (See Comments)    Cause muscle aches and cramps   Rosuvastatin  Other (See Comments)    Caused pain in hands, shoulders, back, and indigestion   "

## 2024-08-17 NOTE — Patient Instructions (Addendum)
 As we discussed I will check some labs that may be contributing to the tingling in the feet or hands, possibly face tingling.  However if those labs are normal I think neurology would be the next step to decide if nerve conduction studies or neuroimaging may be appropriate next. I will check your thyroid  test, electrolytes, cholesterol levels today.  Triglycerides may be elevated if you are not fasting but LDL level should still be helpful.  Keep follow-up with cardiology as planned. Area in the left axilla certainly could be a lipoma.  I am happy to schedule an ultrasound or diagnostic mammogram of that area, let me know after you have your upcoming routine mammogram if you would like to proceed with those studies. Last bone density test in 2023 did indicate osteoporosis, but barely at -2.5.  If you have a follow-up with your gynecologist this summer for repeat testing and medication discussion I think that would be reasonable.  Calcium  and vitamin D  supplementation for now. Let me know if any refills needed.  Follow-up in 1 month and decide next step for hand, feet, face symptoms as well as the area in the left axilla.  Let me know if there are questions in the meantime.

## 2024-08-20 NOTE — Telephone Encounter (Signed)
 Labs completed on 08/17/24

## 2024-08-22 ENCOUNTER — Ambulatory Visit: Payer: Self-pay | Admitting: Family Medicine

## 2024-08-27 ENCOUNTER — Other Ambulatory Visit: Payer: Self-pay | Admitting: Internal Medicine

## 2024-08-27 ENCOUNTER — Other Ambulatory Visit: Payer: Self-pay | Admitting: Psychiatry

## 2024-08-27 DIAGNOSIS — F3342 Major depressive disorder, recurrent, in full remission: Secondary | ICD-10-CM

## 2024-08-27 DIAGNOSIS — F411 Generalized anxiety disorder: Secondary | ICD-10-CM

## 2024-08-27 DIAGNOSIS — G4700 Insomnia, unspecified: Secondary | ICD-10-CM

## 2024-08-27 NOTE — Telephone Encounter (Signed)
 Lipid Panel within 12 months 08/17/24

## 2024-09-23 ENCOUNTER — Ambulatory Visit: Admitting: Family Medicine

## 2024-10-27 ENCOUNTER — Ambulatory Visit: Admitting: Psychiatry

## 2024-11-06 ENCOUNTER — Ambulatory Visit: Admitting: Internal Medicine

## 2024-12-04 ENCOUNTER — Ambulatory Visit: Admitting: Internal Medicine
